# Patient Record
Sex: Female | Born: 1965 | Race: White | Hispanic: No | Marital: Married | Smoking: Never smoker
Health system: Southern US, Community
[De-identification: ages and names within clinical notes are randomized; demographics above are authoritative.]

## PROBLEM LIST (undated history)

## (undated) DIAGNOSIS — F329 Major depressive disorder, single episode, unspecified: Secondary | ICD-10-CM

## (undated) DIAGNOSIS — F988 Other specified behavioral and emotional disorders with onset usually occurring in childhood and adolescence: Secondary | ICD-10-CM

## (undated) DIAGNOSIS — F419 Anxiety disorder, unspecified: Secondary | ICD-10-CM

## (undated) DIAGNOSIS — N390 Urinary tract infection, site not specified: Secondary | ICD-10-CM

## (undated) DIAGNOSIS — Z8659 Personal history of other mental and behavioral disorders: Secondary | ICD-10-CM

## (undated) DIAGNOSIS — J309 Allergic rhinitis, unspecified: Secondary | ICD-10-CM

## (undated) DIAGNOSIS — K219 Gastro-esophageal reflux disease without esophagitis: Secondary | ICD-10-CM

## (undated) DIAGNOSIS — J019 Acute sinusitis, unspecified: Secondary | ICD-10-CM

## (undated) DIAGNOSIS — C50919 Malignant neoplasm of unspecified site of unspecified female breast: Secondary | ICD-10-CM

## (undated) DIAGNOSIS — R220 Localized swelling, mass and lump, head: Secondary | ICD-10-CM

## (undated) DIAGNOSIS — D649 Anemia, unspecified: Secondary | ICD-10-CM

## (undated) DIAGNOSIS — E119 Type 2 diabetes mellitus without complications: Secondary | ICD-10-CM

## (undated) DIAGNOSIS — R221 Localized swelling, mass and lump, neck: Secondary | ICD-10-CM

## (undated) DIAGNOSIS — R5381 Other malaise: Secondary | ICD-10-CM

## (undated) DIAGNOSIS — Z923 Personal history of irradiation: Secondary | ICD-10-CM

## (undated) DIAGNOSIS — Z9221 Personal history of antineoplastic chemotherapy: Secondary | ICD-10-CM

## (undated) DIAGNOSIS — R1084 Generalized abdominal pain: Secondary | ICD-10-CM

## (undated) DIAGNOSIS — J342 Deviated nasal septum: Secondary | ICD-10-CM

## (undated) DIAGNOSIS — R5383 Other fatigue: Secondary | ICD-10-CM

## (undated) DIAGNOSIS — E785 Hyperlipidemia, unspecified: Secondary | ICD-10-CM

## (undated) HISTORY — DX: Other malaise: R53.81

## (undated) HISTORY — DX: Urinary tract infection, site not specified: N39.0

## (undated) HISTORY — DX: Localized swelling, mass and lump, head: R22.0

## (undated) HISTORY — DX: Hyperlipidemia, unspecified: E78.5

## (undated) HISTORY — PX: MASTECTOMY: SHX3

## (undated) HISTORY — DX: Deviated nasal septum: J34.2

## (undated) HISTORY — DX: Other fatigue: R53.83

## (undated) HISTORY — PX: NASAL SEPTUM SURGERY: SHX37

## (undated) HISTORY — DX: Acute sinusitis, unspecified: J01.90

## (undated) HISTORY — DX: Anxiety disorder, unspecified: F41.9

## (undated) HISTORY — DX: Gastro-esophageal reflux disease without esophagitis: K21.9

## (undated) HISTORY — DX: Malignant neoplasm of unspecified site of unspecified female breast: C50.919

## (undated) HISTORY — DX: Personal history of other mental and behavioral disorders: Z86.59

## (undated) HISTORY — DX: Localized swelling, mass and lump, neck: R22.1

## (undated) HISTORY — DX: Anemia, unspecified: D64.9

## (undated) HISTORY — DX: Other specified behavioral and emotional disorders with onset usually occurring in childhood and adolescence: F98.8

## (undated) HISTORY — DX: Major depressive disorder, single episode, unspecified: F32.9

## (undated) HISTORY — PX: COSMETIC SURGERY: SHX468

## (undated) HISTORY — DX: Generalized abdominal pain: R10.84

## (undated) HISTORY — PX: BROW LIFT: SHX178

## (undated) HISTORY — DX: Allergic rhinitis, unspecified: J30.9

---

## 1999-12-09 ENCOUNTER — Other Ambulatory Visit: Admission: RE | Admit: 1999-12-09 | Discharge: 1999-12-09 | Payer: Self-pay | Admitting: Obstetrics and Gynecology

## 2000-11-11 ENCOUNTER — Observation Stay (HOSPITAL_COMMUNITY): Admission: AD | Admit: 2000-11-11 | Discharge: 2000-11-11 | Payer: Self-pay | Admitting: *Deleted

## 2000-11-22 ENCOUNTER — Inpatient Hospital Stay (HOSPITAL_COMMUNITY): Admission: AD | Admit: 2000-11-22 | Discharge: 2000-11-22 | Payer: Self-pay | Admitting: Obstetrics and Gynecology

## 2000-11-22 ENCOUNTER — Encounter: Payer: Self-pay | Admitting: Obstetrics and Gynecology

## 2000-11-26 ENCOUNTER — Other Ambulatory Visit: Admission: RE | Admit: 2000-11-26 | Discharge: 2000-11-26 | Payer: Self-pay | Admitting: Obstetrics and Gynecology

## 2000-12-07 ENCOUNTER — Inpatient Hospital Stay (HOSPITAL_COMMUNITY): Admission: AD | Admit: 2000-12-07 | Discharge: 2000-12-11 | Payer: Self-pay | Admitting: *Deleted

## 2001-07-13 ENCOUNTER — Inpatient Hospital Stay (HOSPITAL_COMMUNITY): Admission: AD | Admit: 2001-07-13 | Discharge: 2001-07-15 | Payer: Self-pay | Admitting: Obstetrics and Gynecology

## 2001-08-18 ENCOUNTER — Other Ambulatory Visit: Admission: RE | Admit: 2001-08-18 | Discharge: 2001-08-18 | Payer: Self-pay | Admitting: Obstetrics and Gynecology

## 2004-04-29 ENCOUNTER — Ambulatory Visit: Payer: Self-pay | Admitting: Internal Medicine

## 2004-07-03 ENCOUNTER — Ambulatory Visit (HOSPITAL_COMMUNITY): Admission: RE | Admit: 2004-07-03 | Discharge: 2004-07-03 | Payer: Self-pay | Admitting: Obstetrics and Gynecology

## 2004-11-12 ENCOUNTER — Ambulatory Visit: Payer: Self-pay | Admitting: Internal Medicine

## 2005-02-11 ENCOUNTER — Ambulatory Visit: Payer: Self-pay | Admitting: Internal Medicine

## 2005-05-13 ENCOUNTER — Ambulatory Visit (HOSPITAL_COMMUNITY): Admission: RE | Admit: 2005-05-13 | Discharge: 2005-05-13 | Payer: Self-pay | Admitting: Internal Medicine

## 2005-05-13 ENCOUNTER — Ambulatory Visit: Payer: Self-pay | Admitting: Internal Medicine

## 2006-05-27 ENCOUNTER — Ambulatory Visit: Payer: Self-pay | Admitting: Internal Medicine

## 2006-05-27 LAB — CONVERTED CEMR LAB
ALT: 29 units/L (ref 0–40)
AST: 34 units/L (ref 0–37)
Albumin: 4.2 g/dL (ref 3.5–5.2)
Alkaline Phosphatase: 59 units/L (ref 39–117)
BUN: 11 mg/dL (ref 6–23)
Basophils Absolute: 0 10*3/uL (ref 0.0–0.1)
Basophils Relative: 0.6 % (ref 0.0–1.0)
Bilirubin Urine: NEGATIVE
Bilirubin, Direct: 0.1 mg/dL (ref 0.0–0.3)
CO2: 28 meq/L (ref 19–32)
Calcium: 9.7 mg/dL (ref 8.4–10.5)
Chloride: 104 meq/L (ref 96–112)
Cholesterol: 230 mg/dL (ref 0–200)
Creatinine, Ser: 0.8 mg/dL (ref 0.4–1.2)
Direct LDL: 140.2 mg/dL
Eosinophils Absolute: 0.1 10*3/uL (ref 0.0–0.6)
Eosinophils Relative: 1.9 % (ref 0.0–5.0)
GFR calc Af Amer: 101 mL/min
GFR calc non Af Amer: 84 mL/min
Glucose, Bld: 99 mg/dL (ref 70–99)
HCT: 41.2 % (ref 36.0–46.0)
HDL: 76 mg/dL (ref >39.0)
Hemoglobin: 14.3 g/dL (ref 12.0–15.0)
Ketones, ur: NEGATIVE mg/dL
Lymphocytes Relative: 33.5 % (ref 12.0–46.0)
MCHC: 34.6 g/dL (ref 30.0–36.0)
MCV: 91.8 fL (ref 78.0–100.0)
Monocytes Absolute: 0.4 10*3/uL (ref 0.2–0.7)
Monocytes Relative: 9 % (ref 3.0–11.0)
Mucus, UA: NEGATIVE
Neutro Abs: 2.6 10*3/uL (ref 1.4–7.7)
Neutrophils Relative %: 55 % (ref 43.0–77.0)
Nitrite: NEGATIVE
Platelets: 302 10*3/uL (ref 150–400)
Potassium: 4.2 meq/L (ref 3.5–5.1)
RBC: 4.49 M/uL (ref 3.87–5.11)
RDW: 11.4 % — ABNORMAL LOW (ref 11.5–14.6)
Sodium: 139 meq/L (ref 135–145)
Specific Gravity, Urine: 1.025 (ref 1.000–1.03)
TSH: 2.04 microintl units/mL (ref 0.35–5.50)
Total Bilirubin: 0.5 mg/dL (ref 0.3–1.2)
Total CHOL/HDL Ratio: 3
Total Protein, Urine: NEGATIVE mg/dL
Total Protein: 7.4 g/dL (ref 6.0–8.3)
Triglycerides: 48 mg/dL (ref 0–149)
Urine Glucose: NEGATIVE mg/dL
Urobilinogen, UA: 0.2 (ref 0.0–1.0)
VLDL: 10 mg/dL (ref 0–40)
WBC: 4.7 10*3/uL (ref 4.5–10.5)
pH: 7 (ref 5.0–8.0)

## 2007-02-05 DIAGNOSIS — J342 Deviated nasal septum: Secondary | ICD-10-CM

## 2007-02-05 DIAGNOSIS — D649 Anemia, unspecified: Secondary | ICD-10-CM | POA: Insufficient documentation

## 2007-02-05 DIAGNOSIS — Z8659 Personal history of other mental and behavioral disorders: Secondary | ICD-10-CM

## 2007-02-05 HISTORY — DX: Deviated nasal septum: J34.2

## 2007-02-05 HISTORY — DX: Personal history of other mental and behavioral disorders: Z86.59

## 2007-02-05 HISTORY — DX: Anemia, unspecified: D64.9

## 2007-02-23 ENCOUNTER — Ambulatory Visit: Payer: Self-pay | Admitting: Internal Medicine

## 2007-02-23 DIAGNOSIS — R5381 Other malaise: Secondary | ICD-10-CM

## 2007-02-23 DIAGNOSIS — J309 Allergic rhinitis, unspecified: Secondary | ICD-10-CM | POA: Insufficient documentation

## 2007-02-23 DIAGNOSIS — N390 Urinary tract infection, site not specified: Secondary | ICD-10-CM

## 2007-02-23 DIAGNOSIS — F329 Major depressive disorder, single episode, unspecified: Secondary | ICD-10-CM

## 2007-02-23 DIAGNOSIS — E785 Hyperlipidemia, unspecified: Secondary | ICD-10-CM

## 2007-02-23 DIAGNOSIS — R5383 Other fatigue: Secondary | ICD-10-CM | POA: Insufficient documentation

## 2007-02-23 HISTORY — DX: Other malaise: R53.81

## 2007-02-23 HISTORY — DX: Urinary tract infection, site not specified: N39.0

## 2007-02-23 HISTORY — DX: Hyperlipidemia, unspecified: E78.5

## 2007-02-23 HISTORY — DX: Allergic rhinitis, unspecified: J30.9

## 2007-02-23 HISTORY — DX: Major depressive disorder, single episode, unspecified: F32.9

## 2007-02-26 ENCOUNTER — Ambulatory Visit: Payer: Self-pay | Admitting: Family Medicine

## 2007-02-26 DIAGNOSIS — J019 Acute sinusitis, unspecified: Secondary | ICD-10-CM

## 2007-02-26 HISTORY — DX: Acute sinusitis, unspecified: J01.90

## 2007-04-04 ENCOUNTER — Ambulatory Visit: Payer: Self-pay | Admitting: Internal Medicine

## 2007-04-14 LAB — CONVERTED CEMR LAB: Pap Smear: NORMAL

## 2007-11-29 ENCOUNTER — Telehealth (INDEPENDENT_AMBULATORY_CARE_PROVIDER_SITE_OTHER): Payer: Self-pay | Admitting: *Deleted

## 2008-01-25 ENCOUNTER — Ambulatory Visit: Payer: Self-pay | Admitting: Internal Medicine

## 2008-02-02 ENCOUNTER — Ambulatory Visit: Payer: Self-pay | Admitting: Professional

## 2008-02-09 ENCOUNTER — Ambulatory Visit: Payer: Self-pay | Admitting: Professional

## 2008-02-13 ENCOUNTER — Ambulatory Visit: Payer: Self-pay | Admitting: Professional

## 2008-02-20 ENCOUNTER — Ambulatory Visit: Payer: Self-pay | Admitting: Professional

## 2008-02-27 ENCOUNTER — Ambulatory Visit: Payer: Self-pay | Admitting: Professional

## 2008-03-05 ENCOUNTER — Ambulatory Visit: Payer: Self-pay | Admitting: Professional

## 2008-03-12 ENCOUNTER — Ambulatory Visit: Payer: Self-pay | Admitting: Professional

## 2008-03-19 ENCOUNTER — Ambulatory Visit: Payer: Self-pay | Admitting: Professional

## 2008-03-23 ENCOUNTER — Ambulatory Visit: Payer: Self-pay | Admitting: Internal Medicine

## 2008-03-23 LAB — CONVERTED CEMR LAB
ALT: 16 units/L (ref 0–35)
AST: 18 units/L (ref 0–37)
Albumin: 3.8 g/dL (ref 3.5–5.2)
Alkaline Phosphatase: 53 units/L (ref 39–117)
BUN: 8 mg/dL (ref 6–23)
Basophils Absolute: 0 10*3/uL (ref 0.0–0.1)
Basophils Relative: 0.1 % (ref 0.0–3.0)
Bilirubin Urine: NEGATIVE
Bilirubin, Direct: 0.1 mg/dL (ref 0.0–0.3)
CO2: 27 meq/L (ref 19–32)
Calcium: 9.2 mg/dL (ref 8.4–10.5)
Chloride: 105 meq/L (ref 96–112)
Cholesterol: 224 mg/dL (ref 0–200)
Creatinine, Ser: 0.8 mg/dL (ref 0.4–1.2)
Direct LDL: 121.8 mg/dL
Eosinophils Absolute: 0.2 10*3/uL (ref 0.0–0.7)
Eosinophils Relative: 3.7 % (ref 0.0–5.0)
GFR calc Af Amer: 100 mL/min
GFR calc non Af Amer: 83 mL/min
Glucose, Bld: 85 mg/dL (ref 70–99)
HCT: 36.4 % (ref 36.0–46.0)
HDL: 81.6 mg/dL (ref >39.0)
Hemoglobin, Urine: NEGATIVE
Hemoglobin: 12.6 g/dL (ref 12.0–15.0)
Ketones, ur: NEGATIVE mg/dL
Leukocytes, UA: NEGATIVE
Lymphocytes Relative: 30.4 % (ref 12.0–46.0)
MCHC: 34.7 g/dL (ref 30.0–36.0)
MCV: 92.8 fL (ref 78.0–100.0)
Monocytes Absolute: 0.6 10*3/uL (ref 0.1–1.0)
Monocytes Relative: 9.8 % (ref 3.0–12.0)
Neutro Abs: 3.2 10*3/uL (ref 1.4–7.7)
Neutrophils Relative %: 56 % (ref 43.0–77.0)
Nitrite: NEGATIVE
Platelets: 177 10*3/uL (ref 150–400)
Potassium: 4.4 meq/L (ref 3.5–5.1)
RBC: 3.92 M/uL (ref 3.87–5.11)
RDW: 11.5 % (ref 11.5–14.6)
Sodium: 139 meq/L (ref 135–145)
Specific Gravity, Urine: 1.015 (ref 1.000–1.03)
TSH: 1.41 microintl units/mL (ref 0.35–5.50)
Total Bilirubin: 0.6 mg/dL (ref 0.3–1.2)
Total CHOL/HDL Ratio: 2.7
Total Protein, Urine: NEGATIVE mg/dL
Total Protein: 6.4 g/dL (ref 6.0–8.3)
Triglycerides: 48 mg/dL (ref 0–149)
Urine Glucose: NEGATIVE mg/dL
Urobilinogen, UA: 0.2 (ref 0.0–1.0)
VLDL: 10 mg/dL (ref 0–40)
WBC: 5.7 10*3/uL (ref 4.5–10.5)
pH: 7.5 (ref 5.0–8.0)

## 2008-03-26 ENCOUNTER — Ambulatory Visit: Payer: Self-pay | Admitting: Professional

## 2008-03-26 ENCOUNTER — Ambulatory Visit: Payer: Self-pay | Admitting: Internal Medicine

## 2008-04-16 ENCOUNTER — Ambulatory Visit: Payer: Self-pay | Admitting: Professional

## 2008-04-23 ENCOUNTER — Ambulatory Visit: Payer: Self-pay | Admitting: Professional

## 2008-04-24 ENCOUNTER — Ambulatory Visit: Payer: Self-pay | Admitting: Internal Medicine

## 2008-04-26 ENCOUNTER — Telehealth (INDEPENDENT_AMBULATORY_CARE_PROVIDER_SITE_OTHER): Payer: Self-pay | Admitting: *Deleted

## 2008-04-30 ENCOUNTER — Ambulatory Visit: Payer: Self-pay | Admitting: Professional

## 2008-05-07 ENCOUNTER — Ambulatory Visit: Payer: Self-pay | Admitting: Professional

## 2008-05-17 ENCOUNTER — Ambulatory Visit: Payer: Self-pay | Admitting: Professional

## 2008-05-21 ENCOUNTER — Ambulatory Visit: Payer: Self-pay | Admitting: Professional

## 2008-05-28 ENCOUNTER — Ambulatory Visit: Payer: Self-pay | Admitting: Professional

## 2008-06-04 ENCOUNTER — Ambulatory Visit: Payer: Self-pay | Admitting: Professional

## 2008-06-11 ENCOUNTER — Ambulatory Visit: Payer: Self-pay | Admitting: Professional

## 2008-06-21 ENCOUNTER — Ambulatory Visit: Payer: Self-pay | Admitting: Professional

## 2008-07-09 ENCOUNTER — Ambulatory Visit: Payer: Self-pay | Admitting: Professional

## 2008-07-10 ENCOUNTER — Telehealth (INDEPENDENT_AMBULATORY_CARE_PROVIDER_SITE_OTHER): Payer: Self-pay | Admitting: *Deleted

## 2008-07-23 ENCOUNTER — Ambulatory Visit: Payer: Self-pay | Admitting: Professional

## 2008-07-27 ENCOUNTER — Ambulatory Visit: Payer: Self-pay | Admitting: Internal Medicine

## 2008-07-27 DIAGNOSIS — R220 Localized swelling, mass and lump, head: Secondary | ICD-10-CM

## 2008-07-27 DIAGNOSIS — R221 Localized swelling, mass and lump, neck: Secondary | ICD-10-CM

## 2008-07-27 HISTORY — DX: Localized swelling, mass and lump, head: R22.0

## 2008-07-30 ENCOUNTER — Ambulatory Visit: Payer: Self-pay | Admitting: Professional

## 2008-08-06 ENCOUNTER — Ambulatory Visit: Payer: Self-pay | Admitting: Professional

## 2008-08-13 ENCOUNTER — Ambulatory Visit: Payer: Self-pay | Admitting: Professional

## 2008-08-27 ENCOUNTER — Ambulatory Visit: Payer: Self-pay | Admitting: Professional

## 2008-09-17 ENCOUNTER — Ambulatory Visit: Payer: Self-pay | Admitting: Professional

## 2008-10-01 ENCOUNTER — Ambulatory Visit: Payer: Self-pay | Admitting: Professional

## 2008-11-01 ENCOUNTER — Ambulatory Visit: Payer: Self-pay | Admitting: Professional

## 2008-11-12 ENCOUNTER — Ambulatory Visit: Payer: Self-pay | Admitting: Professional

## 2008-11-19 ENCOUNTER — Ambulatory Visit: Payer: Self-pay | Admitting: Professional

## 2008-11-29 ENCOUNTER — Ambulatory Visit: Payer: Self-pay | Admitting: Professional

## 2008-12-06 ENCOUNTER — Ambulatory Visit: Payer: Self-pay | Admitting: Professional

## 2008-12-27 ENCOUNTER — Ambulatory Visit: Payer: Self-pay | Admitting: Internal Medicine

## 2008-12-27 DIAGNOSIS — K219 Gastro-esophageal reflux disease without esophagitis: Secondary | ICD-10-CM

## 2008-12-27 DIAGNOSIS — R1084 Generalized abdominal pain: Secondary | ICD-10-CM | POA: Insufficient documentation

## 2008-12-27 HISTORY — DX: Gastro-esophageal reflux disease without esophagitis: K21.9

## 2008-12-27 HISTORY — DX: Generalized abdominal pain: R10.84

## 2009-01-02 ENCOUNTER — Encounter: Payer: Self-pay | Admitting: Internal Medicine

## 2009-01-03 ENCOUNTER — Ambulatory Visit: Payer: Self-pay | Admitting: Professional

## 2009-01-10 ENCOUNTER — Ambulatory Visit: Payer: Self-pay | Admitting: Professional

## 2009-01-17 ENCOUNTER — Ambulatory Visit: Payer: Self-pay | Admitting: Professional

## 2009-01-21 ENCOUNTER — Ambulatory Visit: Payer: Self-pay | Admitting: Professional

## 2009-01-28 ENCOUNTER — Ambulatory Visit: Payer: Self-pay | Admitting: Professional

## 2009-01-29 ENCOUNTER — Ambulatory Visit: Payer: Self-pay | Admitting: Gastroenterology

## 2009-01-30 LAB — CONVERTED CEMR LAB
BUN: 11 mg/dL (ref 6–23)
Basophils Absolute: 0 10*3/uL (ref 0.0–0.1)
Basophils Relative: 0.8 % (ref 0.0–3.0)
CO2: 26 meq/L (ref 19–32)
Calcium: 9.3 mg/dL (ref 8.4–10.5)
Chloride: 107 meq/L (ref 96–112)
Creatinine, Ser: 0.8 mg/dL (ref 0.4–1.2)
Eosinophils Absolute: 0.1 10*3/uL (ref 0.0–0.7)
Eosinophils Relative: 1.7 % (ref 0.0–5.0)
GFR calc non Af Amer: 82.25 mL/min (ref >60)
Glucose, Bld: 87 mg/dL (ref 70–99)
HCT: 38.6 % (ref 36.0–46.0)
Hemoglobin: 13.3 g/dL (ref 12.0–15.0)
Lymphocytes Relative: 31.9 % (ref 12.0–46.0)
Lymphs Abs: 1.7 10*3/uL (ref 0.7–4.0)
MCHC: 34.4 g/dL (ref 30.0–36.0)
MCV: 93.8 fL (ref 78.0–100.0)
Monocytes Absolute: 0.4 10*3/uL (ref 0.1–1.0)
Monocytes Relative: 6.7 % (ref 3.0–12.0)
Neutro Abs: 3.1 10*3/uL (ref 1.4–7.7)
Neutrophils Relative %: 58.9 % (ref 43.0–77.0)
Platelets: 215 10*3/uL (ref 150.0–400.0)
Potassium: 4.2 meq/L (ref 3.5–5.1)
RBC: 4.11 M/uL (ref 3.87–5.11)
RDW: 11.4 % — ABNORMAL LOW (ref 11.5–14.6)
Sodium: 141 meq/L (ref 135–145)
TSH: 1.59 microintl units/mL (ref 0.35–5.50)
WBC: 5.3 10*3/uL (ref 4.5–10.5)

## 2009-02-11 ENCOUNTER — Ambulatory Visit: Payer: Self-pay | Admitting: Professional

## 2009-02-25 ENCOUNTER — Ambulatory Visit: Payer: Self-pay | Admitting: Professional

## 2009-03-21 ENCOUNTER — Ambulatory Visit: Payer: Self-pay | Admitting: Professional

## 2009-04-11 ENCOUNTER — Ambulatory Visit: Payer: Self-pay | Admitting: Professional

## 2009-05-02 ENCOUNTER — Ambulatory Visit: Payer: Self-pay | Admitting: Professional

## 2009-05-10 ENCOUNTER — Telehealth: Payer: Self-pay | Admitting: Internal Medicine

## 2009-07-23 ENCOUNTER — Telehealth: Payer: Self-pay | Admitting: Internal Medicine

## 2009-08-19 ENCOUNTER — Ambulatory Visit: Payer: Self-pay | Admitting: Professional

## 2010-03-11 ENCOUNTER — Ambulatory Visit: Payer: Self-pay | Admitting: Internal Medicine

## 2010-03-11 LAB — CONVERTED CEMR LAB
ALT: 18 units/L (ref 0–35)
AST: 18 units/L (ref 0–37)
Albumin: 4.2 g/dL (ref 3.5–5.2)
Alkaline Phosphatase: 56 units/L (ref 39–117)
BUN: 13 mg/dL (ref 6–23)
Basophils Absolute: 0 10*3/uL (ref 0.0–0.1)
Basophils Relative: 0.5 % (ref 0.0–3.0)
Bilirubin Urine: NEGATIVE
Bilirubin, Direct: 0.1 mg/dL (ref 0.0–0.3)
CO2: 30 meq/L (ref 19–32)
Calcium: 9.7 mg/dL (ref 8.4–10.5)
Chloride: 104 meq/L (ref 96–112)
Cholesterol: 273 mg/dL — ABNORMAL HIGH (ref 0–200)
Creatinine, Ser: 0.8 mg/dL (ref 0.4–1.2)
Direct LDL: 158.1 mg/dL
Eosinophils Absolute: 0.1 10*3/uL (ref 0.0–0.7)
Eosinophils Relative: 1.2 % (ref 0.0–5.0)
GFR calc non Af Amer: 77.37 mL/min (ref >60)
Glucose, Bld: 91 mg/dL (ref 70–99)
HCT: 40 % (ref 36.0–46.0)
HDL: 83 mg/dL (ref >39.00)
Hemoglobin, Urine: NEGATIVE
Hemoglobin: 13.8 g/dL (ref 12.0–15.0)
Ketones, ur: NEGATIVE mg/dL
Leukocytes, UA: NEGATIVE
Lymphocytes Relative: 26.6 % (ref 12.0–46.0)
Lymphs Abs: 1.6 10*3/uL (ref 0.7–4.0)
MCHC: 34.5 g/dL (ref 30.0–36.0)
MCV: 93.2 fL (ref 78.0–100.0)
Monocytes Absolute: 0.4 10*3/uL (ref 0.1–1.0)
Monocytes Relative: 7.5 % (ref 3.0–12.0)
Neutro Abs: 3.8 10*3/uL (ref 1.4–7.7)
Neutrophils Relative %: 64.2 % (ref 43.0–77.0)
Nitrite: NEGATIVE
Platelets: 258 10*3/uL (ref 150.0–400.0)
Potassium: 5 meq/L (ref 3.5–5.1)
RBC: 4.29 M/uL (ref 3.87–5.11)
RDW: 12.8 % (ref 11.5–14.6)
Sodium: 140 meq/L (ref 135–145)
Specific Gravity, Urine: 1.015 (ref 1.000–1.030)
TSH: 1.29 microintl units/mL (ref 0.35–5.50)
Total Bilirubin: 0.3 mg/dL (ref 0.3–1.2)
Total CHOL/HDL Ratio: 3
Total Protein, Urine: NEGATIVE mg/dL
Total Protein: 6.8 g/dL (ref 6.0–8.3)
Triglycerides: 79 mg/dL (ref 0.0–149.0)
Urine Glucose: NEGATIVE mg/dL
Urobilinogen, UA: 0.2 (ref 0.0–1.0)
VLDL: 15.8 mg/dL (ref 0.0–40.0)
WBC: 6 10*3/uL (ref 4.5–10.5)
pH: 7.5 (ref 5.0–8.0)

## 2010-03-13 ENCOUNTER — Encounter: Payer: Self-pay | Admitting: Internal Medicine

## 2010-03-13 ENCOUNTER — Ambulatory Visit: Payer: Self-pay | Admitting: Internal Medicine

## 2010-03-13 DIAGNOSIS — F988 Other specified behavioral and emotional disorders with onset usually occurring in childhood and adolescence: Secondary | ICD-10-CM | POA: Insufficient documentation

## 2010-03-13 HISTORY — DX: Other specified behavioral and emotional disorders with onset usually occurring in childhood and adolescence: F98.8

## 2010-05-13 NOTE — Progress Notes (Signed)
Summary: REFILL  Phone Note Refill Request Call back at Work Phone [phone]   Refills Requested: Medication #1:  ADDERALL 20 MG TABS 1 by mouth two times a day  - to fill jan 28 Initial call taken by: Lamar Sprinkles, CMA,  July 23, 2009 1:22 PM    New/Updated Medications: ADDERALL 20 MG TABS (AMPHETAMINE-DEXTROAMPHETAMINE) 1 by mouth two times a day  - to fill Jul 23, 2009 Prescriptions: ADDERALL 20 MG TABS (AMPHETAMINE-DEXTROAMPHETAMINE) 1 by mouth two times a day  - to fill Jul 23, 2009  #60 x 0   Entered and Authorized by:   Corwin Levins MD   Signed by:   Corwin Levins MD on 07/23/2009   Method used:   Print then Give to Patient   RxID:   [id number]  done hardcopy to LIM side B - dahlia  Corwin Levins MD  July 23, 2009 1:25 PM   pt informed, Rx in cabinet for pick up Margaret Pyle, CMA  July 23, 2009 3:03 PM

## 2010-05-13 NOTE — Progress Notes (Signed)
Summary: Adderall  Phone Note Call from Patient Call back at Work Phone [phone]   Caller: Patient Summary of Call: pt called requesting refill of Adderall Initial call taken by: Margaret Pyle, CMA,  May 10, 2009 1:36 PM  Follow-up for Phone Call        pt informed, rx in cabinet for pt pick up Follow-up by: Margaret Pyle, CMA,  May 10, 2009 1:57 PM    New/Updated Medications: ADDERALL 20 MG TABS (AMPHETAMINE-DEXTROAMPHETAMINE) 1 by mouth two times a day  - to fill May 10, 2009 Prescriptions: ADDERALL 20 MG TABS (AMPHETAMINE-DEXTROAMPHETAMINE) 1 by mouth two times a day  - to fill May 10, 2009  #60 x 0   Entered and Authorized by:   Corwin Levins MD   Signed by:   Corwin Levins MD on 05/10/2009   Method used:   Print then Give to Patient   RxID:   [id number]  done hardcopy to LIM side B - dahlia Corwin Levins MD  May 10, 2009 1:41 PM

## 2010-05-13 NOTE — Assessment & Plan Note (Signed)
Summary: cpx-lb   Vital Signs:  Patient profile:   45 year old female Height:      66 inches Weight:      164 pounds BMI:     26.57 O2 Sat:      97 % on Room air Temp:     98.4 degrees F oral Pulse rate:   90 / minute BP sitting:   90 / 62  (left arm) Cuff size:   regular  Vitals Entered By: Zella Ball Ewing CMA Duncan Dull) (March 13, 2010 1:17 PM)  O2 Flow:  Room air  Preventive Care Screening  Mammogram:    Date:  09/11/2009    Results:  normal   Pap Smear:    Date:  09/11/2009    Results:  normal   CC: Adult Physical/RE   Primary Care Provider:  Oliver Barre, MD  CC:  Adult Physical/RE.  History of Present Illness: here for wellness and f/u;  overall doing well.  Pt denies CP, worsening sob, doe, wheezing, orthopnea, pnd, worsening LE edema, palps, dizziness or syncope  Pt denies new neuro symptoms such as headache, facial or extremity weakness  Pt denies polydipsia, polyuria  Overall good compliance with meds, wt up 12 lbs with little excercise however  and admits to not be following low chol diet. Used to to [facility] regularly when wt less 2 yrs ago.   Works 80 hr wks.  Pt denies new neuro symptoms such as headache, facial or extremity weakness Denies worsening depressive symptoms, suicidal ideation, or panic., alhtough has been out of her adderal for some time and wants to re-start as she has work performance issues with task completion, wants to consider vyvanse.  No fever, wt loss, night sweats, loss of appetite or other constitutional symptoms  Overall good compliance with meds, and good tolerability.  Pt states good ability with ADL's, low fall risk, home safety reviewed and adequate, no significant change in hearing or vision, and occasionally active only with regular excercise.   Preventive Screening-Counseling & Management      Drug Use:  no.    Problems Prior to Update: 1)  Abdominal Pain, Generalized  (ICD-789.07) 2)  Gerd  (ICD-530.81) 3)  Sinusitis- Acute-nos   (ICD-461.9) 4)  Swelling Mass or Lump in Head and Neck  (ICD-784.2) 5)  Sinusitis- Acute-nos  (ICD-461.9) 6)  Preventive Health Care  (ICD-V70.0) 7)  Acute Sinusitis, Unspecified  (ICD-461.9) 8)  Fatigue  (ICD-780.79) 9)  Allergic Rhinitis  (ICD-477.9) 10)  Hyperlipidemia  (ICD-272.4) 11)  Depression  (ICD-311) 12)  Uti  (ICD-599.0) 13)  Family History Depression  (ICD-V17.0) 14)  Family History of Alcoholism/addiction  (ICD-V61.41) 15)  Hx of Deviated Nasal Septum  (ICD-470) 16)  Caesarean Section, Hx of  (ICD-V39.01) 17)  Attention Deficit Disorder, Hx of  (ICD-V11.8) 18)  Hx of Anemia  (ICD-285.9)  Medications Prior to Update: 1)  Yaz 3-0.02 Mg Tabs (Drospirenone-Ethinyl Estradiol) .Marland Kitchen.. 1 By Mouth Once Daily 2)  Adderall 20 Mg Tabs (Amphetamine-Dextroamphetamine) .Marland Kitchen.. 1 By Mouth Two Times A Day  - To Fill Jul 23, 2009  Current Medications (verified): 1)  Yaz 3-0.02 Mg Tabs (Drospirenone-Ethinyl Estradiol) .Marland Kitchen.. 1 By Mouth Once Daily 2)  Vyvanse 40 Mg Caps (Lisdexamfetamine Dimesylate) .Marland Kitchen.. 1 By Mouth Once Daily  - To Fill Mar 13, 2010 3)  Aspir-Low 81 Mg Tbec (Aspirin) .Marland Kitchen.. 1 By Mouth Once Daily  Allergies (verified): 1)  ! Phenergan  Past History:  Past Medical History: Last updated: 01/29/2009  ATTENTION DEFICIT DISORDER, HX OF (ICD-V11.8) Hx of ANEMIA (ICD-285.9) Depression Hyperlipidemia Allergic rhinitis GERD chronic sinusitis  Past Surgical History: Last updated: 01/29/2009  DEVIATED NASAL SEPTUM (ICD-470) CAESAREAN SECTION, HX OF (ICD-V39.01)     Family History: Last updated: 2010/04/06 half - sister deceased with heart problems grandfather died with MI mother with HTN father with COPD Family History of Alcoholism/Addiction Family History Depression glaucoma no colon cancer sister and father with atrial fibrillation (sister 83yrs older)  Social History: Last updated: 04-06-10 Married Never Smoked Alcohol use-yes - rare master's in fine arts -  2000 - [employer] 2 children   Drug use-no  Risk Factors: Smoking Status: never (02/23/2007)  Family History: Reviewed history from 01/29/2009 and no changes required. half - sister deceased with heart problems grandfather died with MI mother with HTN father with COPD Family History of Alcoholism/Addiction Family History Depression glaucoma no colon cancer sister and father with atrial fibrillation (sister 30yrs older)  Social History: Reviewed history from 01/29/2009 and no changes required. Married Never Smoked Alcohol use-yes - rare master's in fine arts - 2000 - [employer] 2 children   Drug use-no Drug Use:  no  Review of Systems  The patient denies anorexia, fever, vision loss, decreased hearing, hoarseness, chest pain, syncope, dyspnea on exertion, peripheral edema, prolonged cough, headaches, hemoptysis, abdominal pain, melena, hematochezia, severe indigestion/heartburn, hematuria, muscle weakness, suspicious skin lesions, transient blindness, difficulty walking, depression, unusual weight change, abnormal bleeding, enlarged lymph nodes, and angioedema.         all otherwise negative per pt -  has occasional palpitations without assoc symptoms  Physical Exam  General:  alert and well-developed.  Head:  normocephalic and atraumatic.   Eyes:  vision grossly intact, pupils equal, and pupils round.   Ears:  R ear normal and L ear normal.   Nose:  no external deformity and no nasal discharge.   Mouth:  no gingival abnormalities and pharynx pink and moist.   Neck:  supple and no masses.   Lungs:  normal respiratory effort and normal breath sounds.   Heart:  normal rate and regular rhythm.   Abdomen:  soft, non-tender, and normal bowel sounds.   Msk:  no joint tenderness and no joint swelling.   Extremities:  no edema, no erythema  Neurologic:  strength normal in all extremities, sensation intact to light touch, and gait normal.   Skin:  color normal and no rashes.   Psych:   not depressed appearing and slightly anxious.     Impression & Recommendations:  Problem # 1:  Preventive Health Care (ICD-V70.0) Overall doing well, age appropriate education and counseling updated, referral for preventive services and immunizations addressed, dietary counseling and smoking status adressed , most recent labs reviewed, ecg reviewed I have personally reviewed and have noted 1.The patient's medical and social history 2.Their use of alcohol, tobacco or illicit drugs 3.Their current medications and supplements 4. Functional ability including ADL's, fall risk, home safety risk, hearing & visual impairment  5.Diet and physical activities 6.Evidence for depression or mood disorders The patients weight, height, BMI  have been recorded in the chart I have made referrals, counseling and provided education to the patient based review of the above  Orders: EKG w/ Interpretation (93000)  Problem # 2:  ADD (ICD-314.00) tx with vyvanse - asd   Complete Medication List: 1)  Yaz 3-0.02 Mg Tabs (Drospirenone-ethinyl estradiol) .Marland Kitchen.. 1 by mouth once daily 2)  Vyvanse 40 Mg Caps (Lisdexamfetamine dimesylate) .Marland Kitchen.. 1 by  mouth once daily  - to fill Mar 13, 2010 3)  Aspir-low 81 Mg Tbec (Aspirin) .Marland Kitchen.. 1 by mouth once daily  Other Orders: Tdap => 66yrs IM (59563) Admin 1st Vaccine (87564)  Patient Instructions: 1)  Please change te aspirin to 81 mg - 1 per day - COATED only 2)  stop the adderal 3)  take the vyvanse 40 mg 1 per day 4)  see [email or web address].[email or web address] for 50% off coupon 5)  you had the tetanus shot today 6)  please call for your yearly pap smear and mammogram 7)  Please schedule a follow-up appointment in 1 year, or sooner if needed Prescriptions: VYVANSE 40 MG CAPS (LISDEXAMFETAMINE DIMESYLATE) 1 by mouth once daily  - to fill Mar 13, 2010  #30 x 0   Entered and Authorized by:   Corwin Levins MD   Signed by:   Corwin Levins MD on 03/13/2010   Method used:   Print then Give to Patient    RxID:   [id number]    Orders Added: 1)  EKG w/ Interpretation [93000] 2)  Tdap => 87yrs IM [90715] 3)  Admin 1st Vaccine [90471] 4)  Est. Patient 40-64 years [16010]   Immunizations Administered:  Tetanus Vaccine:    Vaccine Type: Tdap    Site: left deltoid    Mfr: GlaxoSmithKline    Dose: 0.5 ml    Route: IM    Given by: Zella Ball Ewing CMA (AAMA)    Exp. Date: 01/31/2012    Lot #: [id number]    VIS given: 02/29/08 version given March 13, 2010.   Immunizations Administered:  Tetanus Vaccine:    Vaccine Type: Tdap    Site: left deltoid    Mfr: GlaxoSmithKline    Dose: 0.5 ml    Route: IM    Given by: Zella Ball Ewing CMA (AAMA)    Exp. Date: 01/31/2012    Lot #: [id number]    VIS given: 02/29/08 version given March 13, 2010.

## 2010-08-18 ENCOUNTER — Encounter: Payer: Self-pay | Admitting: Internal Medicine

## 2010-08-18 ENCOUNTER — Ambulatory Visit (INDEPENDENT_AMBULATORY_CARE_PROVIDER_SITE_OTHER): Payer: BC Managed Care – PPO | Admitting: Internal Medicine

## 2010-08-18 VITALS — BP 110/72 | HR 68 | Temp 97.6°F | Ht 66.0 in | Wt 164.1 lb

## 2010-08-18 DIAGNOSIS — M25469 Effusion, unspecified knee: Secondary | ICD-10-CM

## 2010-08-18 DIAGNOSIS — Z0001 Encounter for general adult medical examination with abnormal findings: Secondary | ICD-10-CM | POA: Insufficient documentation

## 2010-08-18 DIAGNOSIS — M25462 Effusion, left knee: Secondary | ICD-10-CM | POA: Insufficient documentation

## 2010-08-18 DIAGNOSIS — F329 Major depressive disorder, single episode, unspecified: Secondary | ICD-10-CM

## 2010-08-18 DIAGNOSIS — F3289 Other specified depressive episodes: Secondary | ICD-10-CM

## 2010-08-18 DIAGNOSIS — F988 Other specified behavioral and emotional disorders with onset usually occurring in childhood and adolescence: Secondary | ICD-10-CM

## 2010-08-18 DIAGNOSIS — Z0000 Encounter for general adult medical examination without abnormal findings: Secondary | ICD-10-CM

## 2010-08-18 MED ORDER — AMPHETAMINE-DEXTROAMPHET ER 20 MG PO CP24: 20.0000 mg | ORAL_CAPSULE | Freq: Two times a day (BID) | ORAL | Status: DC

## 2010-08-18 MED ORDER — TRAMADOL HCL 50 MG PO TABS: 50.0000 mg | ORAL_TABLET | Freq: Four times a day (QID) | ORAL | Status: DC | PRN

## 2010-08-18 NOTE — Assessment & Plan Note (Signed)
Quite high suspicious for cartilage/meniscus tear;  For pain med, MRI, and refer Ortho/Dr Margaretha Sheffield per pt request

## 2010-08-18 NOTE — Assessment & Plan Note (Signed)
stable overall by hx and exam, most recent lab reviewed with pt, and pt to continue medical treatment as before  Lab Results  Component Value Date   WBC 6.0 03/11/2010   HGB 13.8 03/11/2010   HCT 40.0 03/11/2010   PLT 258.0 03/11/2010   CHOL 273* 03/11/2010   TRIG 79.0 03/11/2010   HDL 83.00 03/11/2010   LDLDIRECT 158.1 03/11/2010   ALT 18 03/11/2010   AST 18 03/11/2010   NA 140 03/11/2010   K 5.0 03/11/2010   CL 104 03/11/2010   CREATININE 0.8 03/11/2010   BUN 13 03/11/2010   CO2 30 03/11/2010   TSH 1.29 03/11/2010

## 2010-08-18 NOTE — Patient Instructions (Signed)
Stop the vyvanse Start the adderal XR 20 mg twice per day You also had Tramadol for pain sent to the pharmacy if needed Continue all other medications as before You will be contacted regarding the referral for: MRI , and Dr Margaretha Sheffield for the left knee Please return in Nov 2012 with Lab testing done 3-5 days before

## 2010-08-18 NOTE — Assessment & Plan Note (Signed)
Ok to try change to adderall ER 20 bid as she tends to work long or odd hours, consider change to adderal 20 bid if not effective

## 2010-08-18 NOTE — Progress Notes (Signed)
  Subjective:    Patient ID: Robin Farley, female    DOB: 1966-04-03, 45 y.o.   MRN: [id number]  HPI  Here with 1 onset left knee pain and swelling, worse to walk and lie in a certain way at night, also stands at work many hours during the wk; remembers turning quickly at work with onset pain, then swelling since then,, no fever, trauma , no hx of gout.  Did have a sprained knee 6-7 yrs ago with skating but ok since then.  No prior ortho eval or films, no MRI, PT.    Also vyvanse not working as well recently due to increased irritability, though not clear if o/w from overwork as well Pt denies chest pain, increased sob or doe, wheezing, orthopnea, PND, increased LE swelling, palpitations, dizziness or syncope.  Pt denies new neurological symptoms such as new headache, or facial or extremity weakness or numbness   Pt denies polydipsia, polyuria.  Denies worsening depressive symptoms, suicidal ideation, or panic.  Overall good compliance with treatment, and good medicine tolerability.   Review of Systems All otherwise neg per pt     Objective:   Physical Exam BP 110/72  Pulse 68  Temp(Src) 97.6 F (36.4 C) (Oral)  Ht 5\' 6"  (1.676 m)  Wt 164 lb 2 oz (74.447 kg)  BMI 26.49 kg/m2  SpO2 97%  LMP 08/08/2010 Physical Exam  VS noted Constitutional: Pt appears well-developed and well-nourished.  HENT: Head: Normocephalic.  Right Ear: External ear normal.  Left Ear: External ear normal.  Eyes: Conjunctivae and EOM are normal. Pupils are equal, round, and reactive to light.  Neck: Normal range of motion. Neck supple.  Cardiovascular: Normal rate and regular rhythm.   Pulmonary/Chest: Effort normal and breath sounds normal.  Abd:  Soft, NT, non-distended, + BS Neurological: Pt is alert. No cranial nerve deficit.  Skin: Skin is warm. No erythema.  Psychiatric: Pt behavior is normal. Thought content normal.  Not depressed appearing, mild anxious at best Left knee with warmth laterally , mod  effusion, and click with FROM        Assessment & Plan:

## 2010-08-21 ENCOUNTER — Ambulatory Visit (HOSPITAL_COMMUNITY)
Admission: RE | Admit: 2010-08-21 | Discharge: 2010-08-21 | Disposition: A | Discharge status: Home / Self Care | Payer: Worker's Compensation | Source: Ambulatory Visit | Attending: Internal Medicine | Admitting: Internal Medicine

## 2010-08-21 DIAGNOSIS — M25462 Effusion, left knee: Secondary | ICD-10-CM

## 2010-08-21 DIAGNOSIS — M25469 Effusion, unspecified knee: Secondary | ICD-10-CM | POA: Insufficient documentation

## 2010-08-21 DIAGNOSIS — M2240 Chondromalacia patellae, unspecified knee: Secondary | ICD-10-CM | POA: Insufficient documentation

## 2010-08-21 DIAGNOSIS — M25569 Pain in unspecified knee: Secondary | ICD-10-CM | POA: Insufficient documentation

## 2010-08-29 NOTE — H&P (Signed)
[facility]  Patient:    Robin Farley, Robin Farley Visit Number: [id number] MRN: [id number]          Service Type: OBS Location: 9300 [id number] Attending Physician:  Marina Gravel B Adm. Date:  12/07/2000   CC:         [facility]   History and Physical  CHIEF COMPLAINT:                Refractory hyperemesis.  HISTORY OF PRESENT ILLNESS:     The patient is a 45 year old white female, G5, P1-0-3-1, EDD July 03, 2000, at 10+ weeks who presents with recurrent refractory hyperemesis, a 10 pound weight loss.  She has recently been tried on Zofran subcutaneous pump, Zofran IV, intravenous fluids and Reglan p.o. with persistent hyperemesis, weight loss and ketonuria for establishment of IV antiemetic therapy.  PAST MEDICAL HISTORY:           Remarkable for spontaneous abortion in 1986, therapeutic abortion 1990, primary C-section in 1998 and spontaneous pregnancy loss in June 2000.  GYNECOLOGIC HISTORY:            Remarkable for abnormal Pap smear.  ALLERGIES:                      No known drug allergies.  MEDICATIONS:                    Zofran, Zantac, and IV fluid therapy.  FAMILY HISTORY:                 COPD, cardiovascular disease, hypertension, diabetes, thyroid disease and urolithiasis.  PAST SURGICAL HISTORY:          Surgical history is remarkable for deviated septum surgery in 1994.  PRENATAL LABORATORY DATA:       Prenatal lab data reveals a blood type of O positive, Rh antibody negative, rubella immune, hepatitis B surface negative, HIV nonreactive.  PHYSICAL EXAMINATION:  GENERAL:                        She is an ill-appearing white female in no apparent distress.  HEENT:                          Normal.  LUNGS:                          Clear.  HEART:                          Regular rhythm.  ABDOMEN:                        Soft, nontender.  PELVIC:                         Exam reveals a 10 week intrauterine pregnancy.  Fetal  heart tones noted.  EXTREMITIES:                    No cords.  NEUROLOGICAL:                   Exam is nonfocal.  IMPRESSION:                     Refractory hyperemesis with previous stable electrolytes.  PLAN:  Proceed with IV fluid therapy, possible PPN. Solu-Medrol protocol discussed and will be offered and established as home IV therapy.  The patient acknowledges and desires to proceed. Attending Physician:  Marina Gravel B DD:  12/08/00 TD:  12/08/00 Job: [id number]

## 2010-08-29 NOTE — H&P (Signed)
[facility]  Patient:    Robin Farley, Robin Farley Visit Number: [id number] MRN: [id number]          Service Type: Attending:  Lenoard Aden, M.D. Dictated by:   Lenoard Aden, M.D. Adm. Date:  07/13/01   CC:         [facility]   History and Physical  CHIEF COMPLAINT:              Postdates.  HISTORY OF PRESENT ILLNESS:   A 45 year old white female, G5, P1, EDD July 01, 2001, at 41-5/7ths weeks who presents for postdates induction.  Patient has no known drug allergies.  MEDICATIONS:                  Prenatal vitamins.  OBSTETRIC HISTORY:            SAB 1986, TAB 1990, primary C section for failure to descend 1998, spontaneous abortion in June 2000.  No other medical or surgical hospitalizations except for deviated septum surgery in 1994.  FAMILY HISTORY:               Autism, drug abuse, myocardial infarctions, hypertension, and chronic obstructive pulmonary disease.  PRENATAL LAB DATA:            Blood type O positive, Rh antibody negative, rubella immune, hepatitis B surface antigen negative.  HIV nonreactive.  PHYSICAL EXAMINATION:  GENERAL:                      She is a well-developed, well-nourished white female in no apparent distress.  HEENT:                        Normal.  LUNGS:                        Clear.  HEART:                        Regular rhythm.  ABDOMEN:                      Soft, gravid, nontender.  Estimated fetal weight of 7-1/2 pounds.  PELVIC:                       Cervix is 4 cm, 80% vertex, and -1.  EXTREMITIES:                  No cords.  NEUROLOGIC:                   Exam nonfocal.  IMPRESSION:                   Postdates intrauterine pregnancy for induction.  PLAN:                         Artificial rupture of membranes, possible Pitocin augmentation anticipated, attempts at vaginal delivery. Dictated by:   Lenoard Aden, M.D. Attending:  Lenoard Aden, M.D. DD:  07/13/01 TD:   07/13/01 Job: [id number]

## 2010-08-29 NOTE — Discharge Summary (Signed)
[facility]  Patient:    MYREL, RAPPLEYE Visit Number: [id number] MRN: [id number]          Service Type: MED Location: 9300 [id number] [id number] Attending Physician:  Ermalene Searing Dictated by:   Lenoard Aden, M.D. Admit Date:  12/07/2000 Discharge Date: 12/11/2000                             Discharge Summary  HOSPITAL COURSE:              The patient underwent an admission for refractory hyperemesis and IV steroid protocol.  She responded well with IV steroids and Zantac and Reglan.  She was discharged to home on hospital day #4 switched over to oral regime.  Precautions given.  Follow up in the office in one week. Dictated by:   Lenoard Aden, M.D. Attending Physician:  Marina Gravel B DD:  01/01/01 TD:  01/01/01 Job: [id number]

## 2010-09-12 ENCOUNTER — Ambulatory Visit: Payer: Worker's Compensation | Admitting: Internal Medicine

## 2010-09-15 ENCOUNTER — Other Ambulatory Visit (INDEPENDENT_AMBULATORY_CARE_PROVIDER_SITE_OTHER): Payer: BC Managed Care – PPO

## 2010-09-15 ENCOUNTER — Ambulatory Visit (INDEPENDENT_AMBULATORY_CARE_PROVIDER_SITE_OTHER): Payer: BC Managed Care – PPO | Admitting: Internal Medicine

## 2010-09-15 ENCOUNTER — Encounter: Payer: Self-pay | Admitting: Internal Medicine

## 2010-09-15 VITALS — BP 102/70 | HR 70 | Temp 98.0°F | Ht 66.0 in | Wt 165.5 lb

## 2010-09-15 DIAGNOSIS — M25469 Effusion, unspecified knee: Secondary | ICD-10-CM

## 2010-09-15 DIAGNOSIS — R109 Unspecified abdominal pain: Secondary | ICD-10-CM

## 2010-09-15 DIAGNOSIS — R197 Diarrhea, unspecified: Secondary | ICD-10-CM

## 2010-09-15 DIAGNOSIS — M25462 Effusion, left knee: Secondary | ICD-10-CM

## 2010-09-15 DIAGNOSIS — R110 Nausea: Secondary | ICD-10-CM | POA: Insufficient documentation

## 2010-09-15 LAB — BASIC METABOLIC PANEL
BUN: 10 mg/dL (ref 6–23)
Calcium: 9.2 mg/dL (ref 8.4–10.5)
GFR: 86.65 mL/min (ref >60.00)
Glucose, Bld: 64 mg/dL — ABNORMAL LOW (ref 70–99)
Potassium: 4.4 mEq/L (ref 3.5–5.1)

## 2010-09-15 LAB — URINALYSIS, ROUTINE W REFLEX MICROSCOPIC
Bilirubin Urine: NEGATIVE
Ketones, ur: NEGATIVE
pH: 5 (ref 5.0–8.0)

## 2010-09-15 LAB — HEPATIC FUNCTION PANEL
ALT: 12 U/L (ref 0–35)
AST: 16 U/L (ref 0–37)
Alkaline Phosphatase: 52 U/L (ref 39–117)
Bilirubin, Direct: 0.1 mg/dL (ref 0.0–0.3)
Total Bilirubin: 0.5 mg/dL (ref 0.3–1.2)

## 2010-09-15 LAB — CBC WITH DIFFERENTIAL/PLATELET
Eosinophils Relative: 1.6 % (ref 0.0–5.0)
Monocytes Relative: 7.4 % (ref 3.0–12.0)
Neutrophils Relative %: 59.1 % (ref 43.0–77.0)
Platelets: 263 10*3/uL (ref 150.0–400.0)
WBC: 5.1 10*3/uL (ref 4.5–10.5)

## 2010-09-15 MED ORDER — PROMETHAZINE HCL 25 MG PO TABS: 25.0000 mg | ORAL_TABLET | Freq: Four times a day (QID) | ORAL | Status: AC | PRN

## 2010-09-15 MED ORDER — METRONIDAZOLE 250 MG PO TABS: 250.0000 mg | ORAL_TABLET | Freq: Three times a day (TID) | ORAL | Status: AC

## 2010-09-15 NOTE — Assessment & Plan Note (Addendum)
See diarrhea discussion, to GI if symptoms persist or worsen, I dont seen need for imaging at this time; will check cbc, bmet today

## 2010-09-15 NOTE — Assessment & Plan Note (Signed)
Mild, likely related to other GI symtpoms, for phenergan prn, and gave samples prilosec 20 mg bid prn

## 2010-09-15 NOTE — Progress Notes (Signed)
Subjective:    Patient ID: Robin Farley, female    DOB: 1965/07/22, 45 y.o.   MRN: [id number]  HPI  Here with c/o acute onset abd crampy pain, mild to mod for 2-3 wks ongoing; assoc with nausea, watery daily stools (sometimes more than one), gas /bloated feeling and unusual heartburn and regurgitation like symptoms of food, but denies fever, vomiting, back pain, constipation, chills, wt loss, dysphagia, or blood.  No prior hx of this in past.  No sick contacts.  Did have contact with mother's well water 2 wks ago, but mother not ill and only drank after onset symptoms anyway.  No HIV risk per pt, and overall not getting worse, just not getting better.  Did see ortho - opting for conservative tx for the left knee with NSAID which has seemed to work well, and effusion better, and diarrhea did seem to start about 3 days into the start of the nsaid, but she stopped the nsaid almost immed and diarrhea persists.  Past Medical History  Diagnosis Date  . HYPERLIPIDEMIA 02/23/2007  . ANEMIA 02/05/2007  . DEPRESSION 02/23/2007  . ADD 03/13/2010  . Acute sinusitis, unspecified 02/26/2007  . Deviated nasal septum 02/05/2007  . ALLERGIC RHINITIS 02/23/2007  . GERD 12/27/2008  . UTI 02/23/2007  . FATIGUE 02/23/2007  . SWELLING MASS OR LUMP IN HEAD AND NECK 07/27/2008  . Abdominal pain, generalized 12/27/2008  . ATTENTION DEFICIT DISORDER, HX OF 02/05/2007   Past Surgical History  Procedure Date  . Nasal septum surgery   . Cesarean section     reports that she has never smoked. She does not have any smokeless tobacco history on file. She reports that she drinks alcohol. She reports that she does not use illicit drugs. family history includes Alcohol abuse in her other; Atrial fibrillation in her father and sister; COPD in her father; Depression in her other; Glaucoma in her other; and Hypertension in her mother. Allergies  Allergen Reactions  . Promethazine Hcl    Current Outpatient Prescriptions on  File Prior to Visit  Medication Sig Dispense Refill  . aspirin 81 MG EC tablet Take 81 mg by mouth daily.        Marland Kitchen amphetamine-dextroamphetamine (ADDERALL XR, 20MG ,) 20 MG 24 hr capsule Take 1 capsule (20 mg total) by mouth 2 (two) times daily.  60 capsule  0  . drospirenone-ethinyl estradiol (YAZ) 3-0.02 MG per tablet Take 1 tablet by mouth daily.        . traMADol (ULTRAM) 50 MG tablet Take 1 tablet (50 mg total) by mouth every 6 (six) hours as needed for pain.  60 tablet  1   Review of Systems All otherwise neg per pt     Objective:   Physical Exam BP 102/70  Pulse 70  Temp(Src) 98 F (36.7 C) (Oral)  Ht 5\' 6"  (1.676 m)  Wt 165 lb 8 oz (75.07 kg)  BMI 26.71 kg/m2  SpO2 98%  LMP 09/08/2010 Physical Exam  VS noted Constitutional: Pt is oriented to person, place, and time. Appears well-developed and well-nourished.  HENT:  Head: Normocephalic and atraumatic.  Right Ear: External ear normal.  Left Ear: External ear normal.  Nose: Nose normal.  Mouth/Throat: Oropharynx is clear and moist.  Eyes: Conjunctivae and EOM are normal. Pupils are equal, round, and reactive to light.  Neck: Normal range of motion. Neck supple. No JVD present. No tracheal deviation present.  Cardiovascular: Normal rate, regular rhythm, normal heart sounds and intact distal  pulses.   Pulmonary/Chest: Effort normal and breath sounds normal.  Abdominal: Soft. Bowel sounds are normal. There is no tenderness. excpt for minor to mild tender epigastric area and right mid lateral abd, but non-distended, no guarding or rebound Musculoskeletal: Normal range of motion. Exhibits no edema.  Left knee with no effusion, NT, FROM Lymphadenopathy:  Has no cervical adenopathy. or other neck Skin: Skin is warm and dry. No rash noted.  Psychiatric:  Has  normal mood and affect. Behavior is normal.         Assessment & Plan:

## 2010-09-15 NOTE — Assessment & Plan Note (Signed)
Mild to mod symptoms overall but persistent, doubt functional in this case though she has no fever, doubt med related;  Will tx with empiric flagyl though unlikely to be c diff, and check stool for cx, ova and parasite - ? Giardia;  Could also be viral, doubt other infectious such as HIV related or fungal;  Consider GI if persists or worsens

## 2010-09-15 NOTE — Patient Instructions (Signed)
Take all new medications as prescribed - the nausea medication, the generic flagyl for infection, samples of prilosec at 1-2 per day for antacid, and OTC Immodium as needed for the diarrhea Continue all other medications as before Please go to LAB in the Basement for the blood and/or urine tests to be done today Please call the phone number [phone] (the PhoneTree System) for results of testing in 2-3 days;  When calling, simply dial the number, and when prompted enter the MRN number above (the Medical Record Number) and the # key, then the message should start.

## 2010-09-15 NOTE — Assessment & Plan Note (Signed)
Marked improved, ok for tylenol prn for now

## 2010-09-22 ENCOUNTER — Telehealth: Payer: Self-pay

## 2010-09-22 NOTE — Telephone Encounter (Signed)
[facility] Triage Call Report Triage Record Num: [id number] Operator: Di Kindle Patient Name: Robin Farley Call Date & Time: 09/20/2010 2:48:57PM Patient Phone: [phone] PCP: Oliver Barre Patient Gender: Female PCP Fax : [phone] Patient DOB: October 18, 1965 Practice Name: [facility] Reason for Call: "Gillis Santa, calling regarding RX for phenergan, seen last week, Other. PCP is Oliver Barre. Callback number is [phone]. " Call to report Onset 09/20/10 of vomiting, states unable to keep the PO Phenergan down, last urine 1430, afebrile, unsure what is causing the vomiting, was told may be viral. Now has a headache also with sinus drainage. [facility] [phone]. Per office profile, call to [facility]: Edie to advise may change RX for Phenergan from PO to PR. Phenergan 25 mg PR q 4 hrs as needed, # 6, no refills. Advised of need to call office 09/22/10. Protocol(s) Used: Medication Question Calls, No Triage (Adults) Recommended Outcome per Protocol: Provide Information or Advice Only Reason for Outcome: Caller has medication question only and triager answers question Care Advice: ~ 09/20/2010 3:05:34PM Page 1 of 1 [id number]

## 2011-02-18 ENCOUNTER — Other Ambulatory Visit (INDEPENDENT_AMBULATORY_CARE_PROVIDER_SITE_OTHER): Payer: BC Managed Care – PPO

## 2011-02-18 ENCOUNTER — Ambulatory Visit (INDEPENDENT_AMBULATORY_CARE_PROVIDER_SITE_OTHER): Payer: BC Managed Care – PPO | Admitting: Internal Medicine

## 2011-02-18 ENCOUNTER — Encounter: Payer: Self-pay | Admitting: Internal Medicine

## 2011-02-18 ENCOUNTER — Other Ambulatory Visit: Payer: Self-pay | Admitting: Internal Medicine

## 2011-02-18 VITALS — BP 102/64 | HR 75 | Temp 98.1°F | Ht 66.5 in | Wt 174.1 lb

## 2011-02-18 DIAGNOSIS — F329 Major depressive disorder, single episode, unspecified: Secondary | ICD-10-CM

## 2011-02-18 DIAGNOSIS — F32A Depression, unspecified: Secondary | ICD-10-CM

## 2011-02-18 DIAGNOSIS — Z0000 Encounter for general adult medical examination without abnormal findings: Secondary | ICD-10-CM

## 2011-02-18 DIAGNOSIS — F988 Other specified behavioral and emotional disorders with onset usually occurring in childhood and adolescence: Secondary | ICD-10-CM

## 2011-02-18 HISTORY — DX: Depression, unspecified: F32.A

## 2011-02-18 LAB — URINALYSIS, ROUTINE W REFLEX MICROSCOPIC
Ketones, ur: NEGATIVE
Specific Gravity, Urine: 1.025 (ref 1.000–1.030)
Urine Glucose: NEGATIVE
pH: 6.5 (ref 5.0–8.0)

## 2011-02-18 LAB — CBC WITH DIFFERENTIAL/PLATELET
Basophils Relative: 0.4 % (ref 0.0–3.0)
Eosinophils Relative: 1.3 % (ref 0.0–5.0)
HCT: 37.2 % (ref 36.0–46.0)
Hemoglobin: 12.8 g/dL (ref 12.0–15.0)
Lymphs Abs: 1.6 10*3/uL (ref 0.7–4.0)
Monocytes Relative: 8.4 % (ref 3.0–12.0)
Neutro Abs: 2.9 10*3/uL (ref 1.4–7.7)
RBC: 4.09 Mil/uL (ref 3.87–5.11)
WBC: 4.9 10*3/uL (ref 4.5–10.5)

## 2011-02-18 LAB — BASIC METABOLIC PANEL
BUN: 13 mg/dL (ref 6–23)
Calcium: 9.5 mg/dL (ref 8.4–10.5)
Creatinine, Ser: 0.9 mg/dL (ref 0.4–1.2)
GFR: 69.38 mL/min (ref >60.00)
Glucose, Bld: 86 mg/dL (ref 70–99)
Sodium: 143 mEq/L (ref 135–145)

## 2011-02-18 LAB — LIPID PANEL
HDL: 68.1 mg/dL (ref >39.00)
Triglycerides: 74 mg/dL (ref 0.0–149.0)

## 2011-02-18 LAB — HEPATIC FUNCTION PANEL
Albumin: 3.8 g/dL (ref 3.5–5.2)
Alkaline Phosphatase: 56 U/L (ref 39–117)

## 2011-02-18 LAB — LDL CHOLESTEROL, DIRECT: Direct LDL: 177.8 mg/dL

## 2011-02-18 MED ORDER — AMPHETAMINE-DEXTROAMPHETAMINE 20 MG PO TABS: 20.0000 mg | ORAL_TABLET | Freq: Every day | ORAL | Status: DC

## 2011-02-18 MED ORDER — ESCITALOPRAM OXALATE 10 MG PO TABS: 10.0000 mg | ORAL_TABLET | Freq: Every day | ORAL | Status: AC

## 2011-02-18 NOTE — Assessment & Plan Note (Signed)
Ok to re-start the ADD med from previous; adderall 20 bid

## 2011-02-18 NOTE — Assessment & Plan Note (Signed)
Mild, for lexapro trial, also refer to counseling per pt request, o/w stable overall by hx and exam

## 2011-02-18 NOTE — Assessment & Plan Note (Addendum)
Overall doing well, age appropriate education and counseling updated, referrals for preventative services and immunizations addressed, dietary and smoking counseling addressed, most recent labs and ECG reviewed.  I have personally reviewed and have noted: 1) the patient's medical and social history 2) The pt's use of alcohol, tobacco, and illicit drugs 3) The patient's current medications and supplements 4) Functional ability including ADL's, fall risk, home safety risk, hearing and visual impairment 5) Diet and physical activities 6) Evidence for depression or mood disorder 7) The patient's height, weight, and BMI have been recorded in the chart I have made referrals, and provided counseling and education based on review of the above For labs today, and pt needs pap/mammo with GYN - she plans to self refer, declines flu shot today

## 2011-02-18 NOTE — Patient Instructions (Addendum)
Take all new medications as prescribed Please call for refills as you need Continue all other medications as before Please go to LAB in the Basement for the blood and/or urine tests to be done today Please call the phone number [phone] (the PhoneTree System) for results of testing in 2-3 days;  When calling, simply dial the number, and when prompted enter the MRN number above (the Medical Record Number) and the # key, then the message should start. Please remember to followup with your GYN for the yearly pap smear and/or mammogram Please return in 1 year for your yearly visit, or sooner if needed, with Lab testing done 3-5 days before

## 2011-02-22 ENCOUNTER — Encounter: Payer: Self-pay | Admitting: Internal Medicine

## 2011-02-22 NOTE — Progress Notes (Signed)
Subjective:    Patient ID: Robin Farley, female    DOB: 06-03-1965, 44 y.o.   MRN: [id number]  HPI  Here for wellness and f/u;  Overall doing ok;  Pt denies CP, worsening SOB, DOE, wheezing, orthopnea, PND, worsening LE edema, palpitations, dizziness or syncope.  Pt denies neurological change such as new Headache, facial or extremity weakness.  Pt denies polydipsia, polyuria, or low sugar symptoms. Pt states overall good compliance with treatment and medications, good tolerability, and trying to follow lower cholesterol diet.  No fever, wt loss, night sweats, loss of appetite, or other constitutional symptoms.  Pt states good ability with ADL's, low fall risk, home safety reviewed and adequate, no significant changes in hearing or vision, and occasionally active with exercise.  ADD med working well, needs refills, functioning well social and work related.  Has had worsening depressive symptoms however, mild to mod, but no suicidal ideation, or panic, though has ongoing anxiety, not increased recently.   Past Medical History  Diagnosis Date  . HYPERLIPIDEMIA 02/23/2007  . ANEMIA 02/05/2007  . DEPRESSION 02/23/2007  . ADD 03/13/2010  . Acute sinusitis, unspecified 02/26/2007  . Deviated nasal septum 02/05/2007  . ALLERGIC RHINITIS 02/23/2007  . GERD 12/27/2008  . UTI 02/23/2007  . FATIGUE 02/23/2007  . SWELLING MASS OR LUMP IN HEAD AND NECK 07/27/2008  . Abdominal pain, generalized 12/27/2008  . ATTENTION DEFICIT DISORDER, HX OF 02/05/2007  . Depression 02/18/2011   Past Surgical History  Procedure Date  . Nasal septum surgery   . Cesarean section     reports that she has never smoked. She does not have any smokeless tobacco history on file. She reports that she drinks alcohol. She reports that she does not use illicit drugs. family history includes Alcohol abuse in her other; Atrial fibrillation in her father and sister; COPD in her father; Depression in her other; Glaucoma in her other; and  Hypertension in her mother. Allergies  Allergen Reactions  . Promethazine Hcl    Current Outpatient Prescriptions on File Prior to Visit  Medication Sig Dispense Refill  . aspirin 81 MG EC tablet Take 81 mg by mouth daily.         Review of Systems Review of Systems  Constitutional: Negative for diaphoresis, activity change, appetite change and unexpected weight change.  HENT: Negative for hearing loss, ear pain, facial swelling, mouth sores and neck stiffness.   Eyes: Negative for pain, redness and visual disturbance.  Respiratory: Negative for shortness of breath and wheezing.   Cardiovascular: Negative for chest pain and palpitations.  Gastrointestinal: Negative for diarrhea, blood in stool, abdominal distention and rectal pain.  Genitourinary: Negative for hematuria, flank pain and decreased urine volume.  Musculoskeletal: Negative for myalgias and joint swelling.  Skin: Negative for color change and wound.  Neurological: Negative for syncope and numbness.  Hematological: Negative for adenopathy.  Psychiatric/Behavioral: Negative for hallucinations, self-injury, decreased concentration and agitation.        Objective:   Physical Exam BP 102/64  Pulse 75  Temp(Src) 98.1 F (36.7 C) (Oral)  Ht 5' 6.5" (1.689 m)  Wt 174 lb 2 oz (78.983 kg)  BMI 27.68 kg/m2  SpO2 97%  LMP 02/16/2011 Physical Exam  VS noted Constitutional: Pt is oriented to person, place, and time. Appears well-developed and well-nourished.  HENT:  Head: Normocephalic and atraumatic.  Right Ear: External ear normal.  Left Ear: External ear normal.  Nose: Nose normal.  Mouth/Throat: Oropharynx is clear and moist.  Eyes: Conjunctivae and EOM are normal. Pupils are equal, round, and reactive to light.  Neck: Normal range of motion. Neck supple. No JVD present. No tracheal deviation present.  Cardiovascular: Normal rate, regular rhythm, normal heart sounds and intact distal pulses.   Pulmonary/Chest: Effort  normal and breath sounds normal.  Abdominal: Soft. Bowel sounds are normal. There is no tenderness.  Musculoskeletal: Normal range of motion. Exhibits no edema.  Lymphadenopathy:  Has no cervical adenopathy.  Neurological: Pt is alert and oriented to person, place, and time. Pt has normal reflexes. No cranial nerve deficit.  Skin: Skin is warm and dry. No rash noted.  Psychiatric:  Has  normal mood and affect. Behavior is normal.     Assessment & Plan:

## 2011-04-17 ENCOUNTER — Encounter: Payer: Self-pay | Admitting: Internal Medicine

## 2011-04-17 ENCOUNTER — Ambulatory Visit (INDEPENDENT_AMBULATORY_CARE_PROVIDER_SITE_OTHER): Payer: BC Managed Care – PPO | Admitting: Internal Medicine

## 2011-04-17 VITALS — BP 110/62 | HR 84 | Temp 98.1°F | Resp 16 | Wt 166.0 lb

## 2011-04-17 DIAGNOSIS — J069 Acute upper respiratory infection, unspecified: Secondary | ICD-10-CM

## 2011-04-17 MED ORDER — IBUPROFEN 600 MG PO TABS: ORAL_TABLET | ORAL | Status: AC

## 2011-04-17 MED ORDER — AMOXICILLIN 500 MG PO CAPS: 1000.0000 mg | ORAL_CAPSULE | Freq: Two times a day (BID) | ORAL | Status: AC

## 2011-04-17 MED ORDER — PROMETHAZINE-CODEINE 6.25-10 MG/5ML PO SYRP: 5.0000 mL | ORAL_SOLUTION | ORAL | Status: AC | PRN

## 2011-04-17 MED ORDER — FLUCONAZOLE 150 MG PO TABS: 150.0000 mg | ORAL_TABLET | Freq: Once | ORAL | Status: AC

## 2011-04-17 NOTE — Assessment & Plan Note (Signed)
see Meds

## 2011-04-17 NOTE — Patient Instructions (Signed)
Use over-the-counter  "cold" medicines  such as "Tylenol cold" , "Advil cold",  "Mucinex" or" Mucinex D"  for cough and congestion.   Avoid decongestants if you have high blood pressure and use "Afrin" nasal spray for nasal congestion as directed instead. Use" Delsym" or" Robitussin" cough syrup varietis for cough.  You can use plain "Tylenol" or "Advil" for fever, chills and achyness.  

## 2011-04-17 NOTE — Progress Notes (Signed)
  Subjective:    Patient ID: Robin Farley, female    DOB: 1965/06/28, 46 y.o.   MRN: [id number]  HPI  HPI  C/o URI sx's x  12 days. C/o ST, cough, weakness. Not better with OTC medicines. Actually, the patient is getting worse. The patient did not sleep last night due to cough.  Review of Systems  Constitutional: Positive for fever, chills and fatigue.  HENT: Positive for congestion, rhinorrhea, sneezing and postnasal drip - yellow.   Eyes: Positive for photophobia and pain. Negative for discharge and visual disturbance.  Respiratory: Positive for cough  Gastrointestinal: Negative for vomiting, abdominal pain, diarrhea and abdominal distention.  Genitourinary: Negative for dysuria and difficulty urinating.  Skin: Negative for rash.  Neurological: Positive for dizziness, weakness and light-headedness.       Review of Systems     Objective:   Physical Exam  Constitutional: She appears well-developed. No distress.  HENT:  Head: Normocephalic.  Right Ear: External ear normal.  Left Ear: External ear normal.  Nose: Nose normal.  Mouth/Throat: No oropharyngeal exudate.       eryth nasal mucosa  Eyes: Conjunctivae are normal. Pupils are equal, round, and reactive to light. Right eye exhibits no discharge. Left eye exhibits no discharge.  Neck: Normal range of motion. Neck supple. No JVD present. No tracheal deviation present. No thyromegaly present.  Cardiovascular: Normal rate, regular rhythm and normal heart sounds.   Pulmonary/Chest: No stridor. No respiratory distress. She has no wheezes.  Abdominal: Soft. Bowel sounds are normal. She exhibits no distension and no mass. There is no tenderness. There is no rebound and no guarding.  Musculoskeletal: She exhibits no edema and no tenderness.  Lymphadenopathy:    She has no cervical adenopathy.  Neurological: She displays normal reflexes. No cranial nerve deficit. She exhibits normal muscle tone. Coordination normal.  Skin: No  rash noted. No erythema.  Psychiatric: She has a normal mood and affect. Her behavior is normal. Judgment and thought content normal.          Assessment & Plan:

## 2011-04-20 ENCOUNTER — Ambulatory Visit: Payer: BC Managed Care – PPO | Admitting: Internal Medicine

## 2011-07-08 ENCOUNTER — Encounter: Payer: Self-pay | Admitting: Internal Medicine

## 2011-07-08 ENCOUNTER — Ambulatory Visit (INDEPENDENT_AMBULATORY_CARE_PROVIDER_SITE_OTHER): Payer: Managed Care, Other (non HMO) | Admitting: Internal Medicine

## 2011-07-08 VITALS — BP 112/80 | HR 88 | Temp 98.8°F | Ht 66.0 in | Wt 164.1 lb

## 2011-07-08 DIAGNOSIS — M25519 Pain in unspecified shoulder: Secondary | ICD-10-CM

## 2011-07-08 DIAGNOSIS — F988 Other specified behavioral and emotional disorders with onset usually occurring in childhood and adolescence: Secondary | ICD-10-CM

## 2011-07-08 DIAGNOSIS — M25511 Pain in right shoulder: Secondary | ICD-10-CM

## 2011-07-08 DIAGNOSIS — L719 Rosacea, unspecified: Secondary | ICD-10-CM | POA: Insufficient documentation

## 2011-07-08 DIAGNOSIS — R238 Other skin changes: Secondary | ICD-10-CM

## 2011-07-08 DIAGNOSIS — L819 Disorder of pigmentation, unspecified: Secondary | ICD-10-CM

## 2011-07-08 DIAGNOSIS — J019 Acute sinusitis, unspecified: Secondary | ICD-10-CM

## 2011-07-08 MED ORDER — TRETINOIN 0.025 % EX CREA: TOPICAL_CREAM | Freq: Every day | CUTANEOUS | Status: DC

## 2011-07-08 MED ORDER — AMPHETAMINE-DEXTROAMPHETAMINE 20 MG PO TABS: 20.0000 mg | ORAL_TABLET | Freq: Every day | ORAL | Status: DC

## 2011-07-08 MED ORDER — AZITHROMYCIN 250 MG PO TABS: ORAL_TABLET | ORAL | Status: AC

## 2011-07-08 MED ORDER — NAPROXEN 500 MG PO TABS: 500.0000 mg | ORAL_TABLET | Freq: Two times a day (BID) | ORAL | Status: DC

## 2011-07-08 MED ORDER — HYDROCODONE-HOMATROPINE 5-1.5 MG/5ML PO SYRP: 5.0000 mL | ORAL_SOLUTION | Freq: Four times a day (QID) | ORAL | Status: AC | PRN

## 2011-07-08 NOTE — Assessment & Plan Note (Signed)
Ok for retin a,  to f/u any worsening symptoms or concerns

## 2011-07-08 NOTE — Progress Notes (Signed)
Subjective:    Patient ID: Robin Farley, female    DOB: 08/24/1965, 46 y.o.   MRN: [id number]  HPI  Here to f/u -  Here with 3 days acute onset fever, facial pain, pressure, general weakness and malaise, and greenish d/c, with slight ST, but little to no cough and Pt denies chest pain, increased sob or doe, wheezing, orthopnea, PND, increased LE swelling, palpitations, dizziness or syncope.  Pt denies new neurological symptoms such as new headache, or facial or extremity weakness or numbness, except does have what sounds RLS vs PLMD at night but not disrupting enough with sleep that she feels tx is necessary.  Does have mild right shoudler aching and pain but has FROM, aches mosly at night and has to sleep on left side more, but shoulder itself not tender.  ADD meds working well with concentration and task completion - needs refills.  Also mentions recently 2 menses in one mo, worried about pre-menopause but has not yet seen GYN.  Also with worsening fine wrinkles around eyes, and facial discoloration, and roughness, asks for Retin A. Past Medical History  Diagnosis Date  . HYPERLIPIDEMIA 02/23/2007  . ANEMIA 02/05/2007  . DEPRESSION 02/23/2007  . ADD 03/13/2010  . Acute sinusitis, unspecified 02/26/2007  . Deviated nasal septum 02/05/2007  . ALLERGIC RHINITIS 02/23/2007  . GERD 12/27/2008  . UTI 02/23/2007  . FATIGUE 02/23/2007  . SWELLING MASS OR LUMP IN HEAD AND NECK 07/27/2008  . Abdominal pain, generalized 12/27/2008  . ATTENTION DEFICIT DISORDER, HX OF 02/05/2007  . Depression 02/18/2011   Past Surgical History  Procedure Date  . Nasal septum surgery   . Cesarean section     reports that she has never smoked. She does not have any smokeless tobacco history on file. She reports that she drinks alcohol. She reports that she does not use illicit drugs. family history includes Alcohol abuse in her other; Atrial fibrillation in her father and sister; COPD in her father; Depression in her  other; Glaucoma in her other; and Hypertension in her mother. Allergies  Allergen Reactions  . Promethazine Hcl    Current Outpatient Prescriptions on File Prior to Visit  Medication Sig Dispense Refill  . aspirin 81 MG EC tablet Take 81 mg by mouth daily.        Marland Kitchen DISCONTD: amphetamine-dextroamphetamine (ADDERALL, 20MG ,) 20 MG tablet Take 1 tablet (20 mg total) by mouth daily. To fill Apr 19, 2011  60 tablet  0  . amphetamine-dextroamphetamine (ADDERALL XR, 20MG ,) 20 MG 24 hr capsule Take 1 capsule (20 mg total) by mouth 2 (two) times daily.  60 capsule  0   Review of Systems Review of Systems  Constitutional: Negative for diaphoresis and unexpected weight change.  HENT: Negative for drooling and tinnitus.   Eyes: Negative for photophobia and visual disturbance.  Respiratory: Negative for choking and stridor.   Gastrointestinal: Negative for vomiting and blood in stool.  Genitourinary: Negative for hematuria and decreased urine volume.  Musculoskeletal: Negative for gait problem.    Objective:   Physical Exam BP 112/80  Pulse 88  Temp(Src) 98.8 F (37.1 C) (Oral)  Ht 5\' 6"  (1.676 m)  Wt 164 lb 2 oz (74.447 kg)  BMI 26.49 kg/m2  SpO2 97% Physical Exam  VS noted, mil dill Constitutional: Pt appears well-developed and well-nourished.  HENT: Head: Normocephalic.  Right Ear: External ear normal.  Left Ear: External ear normal.  Bilat tm's mild erythema.  Sinus tender.  Pharynx mild  erythema Eyes: Conjunctivae and EOM are normal. Pupils are equal, round, and reactive to light.  Neck: Normal range of motion. Neck supple.  Cardiovascular: Normal rate and regular rhythm.   Pulmonary/Chest: Effort normal and breath sounds normal.  Neurological: Pt is alert. No cranial nerve deficit. motor/gait intact Skin: Skin is warm. No erythema. Has fine wrinkles, roughness and skin discoloration of the facies bilat below the eyes Psychiatric: Pt behavior is normal. Thought content normal.  Right  shoulder NT with FROM, no rash or swelling    Assessment & Plan:

## 2011-07-08 NOTE — Assessment & Plan Note (Signed)
Mild to mod, for antibx course,  to f/u any worsening symptoms or concerns 

## 2011-07-08 NOTE — Assessment & Plan Note (Signed)
stable overall by hx and exam,, and pt to continue medical treatment as before , refills done today 

## 2011-07-08 NOTE — Assessment & Plan Note (Signed)
C/w mild rot cuff tendonitis possible, for nsaid prn, exam with minimal findings, consdier ortho if worsens or persists

## 2011-07-08 NOTE — Patient Instructions (Signed)
Take all new medications as prescribed -  Please be aware the Retin A might require prior authorization, or you may need to see Dermatology to have this approved by insurance Continue all other medications as before You are given the refills today of adderal Please see GYN for the irregular menses if this persists

## 2011-11-17 ENCOUNTER — Ambulatory Visit (INDEPENDENT_AMBULATORY_CARE_PROVIDER_SITE_OTHER): Payer: Managed Care, Other (non HMO) | Admitting: Internal Medicine

## 2011-11-17 ENCOUNTER — Encounter: Payer: Self-pay | Admitting: Internal Medicine

## 2011-11-17 VITALS — BP 100/70 | HR 75 | Temp 97.1°F | Ht 66.5 in | Wt 154.1 lb

## 2011-11-17 DIAGNOSIS — F988 Other specified behavioral and emotional disorders with onset usually occurring in childhood and adolescence: Secondary | ICD-10-CM

## 2011-11-17 DIAGNOSIS — F411 Generalized anxiety disorder: Secondary | ICD-10-CM

## 2011-11-17 DIAGNOSIS — F329 Major depressive disorder, single episode, unspecified: Secondary | ICD-10-CM

## 2011-11-17 DIAGNOSIS — F419 Anxiety disorder, unspecified: Secondary | ICD-10-CM

## 2011-11-17 HISTORY — DX: Anxiety disorder, unspecified: F41.9

## 2011-11-17 MED ORDER — ALPRAZOLAM 1 MG PO TABS: ORAL_TABLET | ORAL | Status: DC
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
## 2011-11-17 NOTE — Assessment & Plan Note (Signed)
stable overall by hx and exam, most recent data reviewed with pt, and pt to continue medical treatment as before Lab Results  Component Value Date   WBC 4.9 02/18/2011   HGB 12.8 02/18/2011   HCT 37.2 02/18/2011   PLT 280.0 02/18/2011   GLUCOSE 86 02/18/2011   CHOL 248* 02/18/2011   TRIG 74.0 02/18/2011   HDL 68.10 02/18/2011   LDLDIRECT 177.8 02/18/2011   ALT 11 02/18/2011   AST 14 02/18/2011   NA 143 02/18/2011   K 4.4 02/18/2011   CL 108 02/18/2011   CREATININE 0.9 02/18/2011   BUN 13 02/18/2011   CO2 29 02/18/2011   TSH 1.70 02/18/2011
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
## 2011-11-17 NOTE — Patient Instructions (Addendum)
Please call psychiatry to see if they take tricare for brother, such as [facility] If this does not work out, other considerations would be [facility] (for urgent assessments), the ER or even [facility] I would recommend considering an appointment for yourself for counseling as well Take all new medications as prescribed Continue all other medications as before

## 2011-11-17 NOTE — Assessment & Plan Note (Signed)
stable overall by hx and exam, and pt to continue medical treatment as before 

## 2011-12-04 ENCOUNTER — Ambulatory Visit (INDEPENDENT_AMBULATORY_CARE_PROVIDER_SITE_OTHER): Payer: Managed Care, Other (non HMO) | Admitting: Internal Medicine

## 2011-12-04 ENCOUNTER — Encounter: Payer: Self-pay | Admitting: Internal Medicine

## 2011-12-04 VITALS — BP 118/70 | HR 80 | Temp 98.3°F | Resp 16 | Wt 156.0 lb

## 2011-12-04 DIAGNOSIS — F329 Major depressive disorder, single episode, unspecified: Secondary | ICD-10-CM

## 2011-12-04 DIAGNOSIS — F988 Other specified behavioral and emotional disorders with onset usually occurring in childhood and adolescence: Secondary | ICD-10-CM

## 2011-12-04 DIAGNOSIS — F411 Generalized anxiety disorder: Secondary | ICD-10-CM

## 2011-12-04 DIAGNOSIS — J019 Acute sinusitis, unspecified: Secondary | ICD-10-CM

## 2011-12-04 DIAGNOSIS — F419 Anxiety disorder, unspecified: Secondary | ICD-10-CM

## 2011-12-04 DIAGNOSIS — F3289 Other specified depressive episodes: Secondary | ICD-10-CM

## 2011-12-04 MED ORDER — LEVOFLOXACIN 250 MG PO TABS: 250.0000 mg | ORAL_TABLET | Freq: Every day | ORAL | Status: AC

## 2011-12-04 NOTE — Patient Instructions (Addendum)
Take all new medications as prescribed Continue all other medications as before  

## 2011-12-05 ENCOUNTER — Encounter: Payer: Self-pay | Admitting: Internal Medicine

## 2011-12-05 NOTE — Assessment & Plan Note (Signed)
Mild to mod, for antibx course,  to f/u any worsening symptoms or concerns 

## 2011-12-05 NOTE — Progress Notes (Signed)
Subjective:    Patient ID: Robin Farley, female    DOB: [birth year], 46 y.o.   MRN: [id number]  HPI   Here with 3 days acute onset fever, facial pain, pressure, general weakness and malaise, and greenish d/c, with slight ST, but little to no cough and Pt denies chest pain, increased sob or doe, wheezing, orthopnea, PND, increased LE swelling, palpitations, dizziness or syncope.  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  Pt denies new neurological symptoms such as new headache, or facial or extremity weakness or numbness   Pt denies polydipsia, polyuria. Past Medical History  Diagnosis Date  . HYPERLIPIDEMIA 02/23/2007  . ANEMIA 02/05/2007  . DEPRESSION 02/23/2007  . ADD 03/13/2010  . Acute sinusitis, unspecified 02/26/2007  . Deviated nasal septum 02/05/2007  . ALLERGIC RHINITIS 02/23/2007  . GERD 12/27/2008  . UTI 02/23/2007  . FATIGUE 02/23/2007  . SWELLING MASS OR LUMP IN HEAD AND NECK 07/27/2008  . Abdominal pain, generalized 12/27/2008  . ATTENTION DEFICIT DISORDER, HX OF 02/05/2007  . Depression 02/18/2011  . Anxiety 11/17/2011   Past Surgical History  Procedure Date  . Nasal septum surgery   . Cesarean section     reports that she has never smoked. She does not have any smokeless tobacco history on file. She reports that she drinks alcohol. She reports that she does not use illicit drugs. family history includes Alcohol abuse in her other; Atrial fibrillation in her father and sister; COPD in her father; Depression in her other; Glaucoma in her other; and Hypertension in her mother. Allergies  Allergen Reactions  . Promethazine Hcl    Current Outpatient Prescriptions  on File Prior to Visit  Medication Sig Dispense Refill  . ALPRAZolam (XANAX) 1 MG tablet 1/2 - 1 by mouth up to three times per day as needed  60 tablet  0  . amphetamine-dextroamphetamine (ADDERALL, 20MG ,) 20 MG tablet Take 1 tablet (20 mg total) by mouth daily. To fill Sep 06, 2011  60 tablet  0  . aspirin 81 MG EC tablet Take 81 mg by mouth daily.        Marland Kitchen tretinoin (RETIN-A) 0.025 % cream Apply topically at bedtime.  45 g  0  . amphetamine-dextroamphetamine (ADDERALL XR, 20MG ,) 20 MG 24 hr capsule Take 1 capsule (20 mg total) by mouth 2 (two) times daily.  60 capsule  0   Review of Systems Constitutional: Negative for diaphoresis and unexpected weight change.  HENT: Negative for tinnitus.   Eyes: Negative for photophobia and visual disturbance.  Respiratory: Negative for choking and stridor.   Gastrointestinal: Negative for vomiting and blood in stool.  Genitourinary: Negative for hematuria and decreased urine volume.  Musculoskeletal: Negative for gait problem.  Skin: Negative for color change and wound.  Neurological: Negative for tremors and numbness.     Objective:   Physical Exam BP 118/70  Pulse 80  Temp 98.3 F (36.8 C) (Oral)  Resp 16  Wt 156 lb (70.761 kg)  SpO2 99% Physical Exam  VS noted, mild ill Constitutional: Pt appears well-developed and well-nourished. but mild disheveled HENT: Head: Normocephalic.  Right Ear: External ear normal.  Left Ear: External  ear normal.  Bilat tm's mild erythema.  Sinus tender right > left.  Pharynx mild erythema Eyes: Conjunctivae and EOM are normal. Pupils are equal, round, and reactive to light.  Neck: Normal range of motion. Neck supple.  Cardiovascular: Normal rate and regular rhythm.   Pulmonary/Chest: Effort normal and breath sounds normal.  Neurological: Pt is alert. Not confused Skin: Skin is warm. No erythema. No rash Psychiatric: Pt behavior is normal. Thought content normal. 1+ nervous    Assessment & Plan:

## 2011-12-05 NOTE — Assessment & Plan Note (Signed)
Some improved, Continue all other medications as before, f/u any worsening

## 2011-12-05 NOTE — Assessment & Plan Note (Signed)
stable overall by hx and exam, , and pt to continue medical treatment as before   

## 2011-12-05 NOTE — Assessment & Plan Note (Signed)
stable overall by hx and exam, most recent data reviewed with pt, and pt to continue medical treatment as before Lab Results  Component Value Date   WBC 4.9 02/18/2011   HGB 12.8 02/18/2011   HCT 37.2 02/18/2011   PLT 280.0 02/18/2011   GLUCOSE 86 02/18/2011   CHOL 248* 02/18/2011   TRIG 74.0 02/18/2011   HDL 68.10 02/18/2011   LDLDIRECT 177.8 02/18/2011   ALT 11 02/18/2011   AST 14 02/18/2011   NA 143 02/18/2011   K 4.4 02/18/2011   CL 108 02/18/2011   CREATININE 0.9 02/18/2011   BUN 13 02/18/2011   CO2 29 02/18/2011   TSH 1.70 02/18/2011    

## 2012-01-13 ENCOUNTER — Other Ambulatory Visit: Payer: Self-pay

## 2012-01-13 DIAGNOSIS — F988 Other specified behavioral and emotional disorders with onset usually occurring in childhood and adolescence: Secondary | ICD-10-CM

## 2012-01-13 MED ORDER — AMPHETAMINE-DEXTROAMPHETAMINE 20 MG PO TABS
20.0000 mg | ORAL_TABLET | Freq: Every day | ORAL | Status: DC
Start: 2012-01-13 — End: 2012-02-24

## 2012-01-13 NOTE — Telephone Encounter (Signed)
Called the patient informed prescription is ready for pickup at the front desk. 

## 2012-01-13 NOTE — Telephone Encounter (Signed)
Done hardcopy to robin - 1 mo only as she did not specify 3 mo

## 2012-02-24 ENCOUNTER — Encounter: Payer: Self-pay | Admitting: Internal Medicine

## 2012-02-24 ENCOUNTER — Ambulatory Visit (INDEPENDENT_AMBULATORY_CARE_PROVIDER_SITE_OTHER): Payer: Managed Care, Other (non HMO) | Admitting: Internal Medicine

## 2012-02-24 VITALS — BP 120/80 | HR 86 | Temp 97.5°F | Ht 66.5 in | Wt 157.5 lb

## 2012-02-24 DIAGNOSIS — Z0000 Encounter for general adult medical examination without abnormal findings: Secondary | ICD-10-CM

## 2012-02-24 DIAGNOSIS — F988 Other specified behavioral and emotional disorders with onset usually occurring in childhood and adolescence: Secondary | ICD-10-CM

## 2012-02-24 MED ORDER — AMPHETAMINE-DEXTROAMPHETAMINE 20 MG PO TABS: 20.0000 mg | ORAL_TABLET | Freq: Every day | ORAL | Status: DC

## 2012-02-24 MED ORDER — ALPRAZOLAM 1 MG PO TABS: ORAL_TABLET | ORAL | Status: DC

## 2012-02-24 MED ORDER — AMPHETAMINE-DEXTROAMPHETAMINE 20 MG PO TABS: 20.0000 mg | ORAL_TABLET | Freq: Two times a day (BID) | ORAL | Status: DC

## 2012-02-24 MED ORDER — TRETINOIN 0.025 % EX CREA: TOPICAL_CREAM | Freq: Every day | CUTANEOUS | Status: DC

## 2012-02-24 NOTE — Progress Notes (Signed)
Subjective:    Patient ID: Robin Farley, female    DOB: 11/04/1965, 46 y.o.   MRN: [id number]  HPI  Here for wellness and f/u;  Overall doing ok;  Pt denies CP, worsening SOB, DOE, wheezing, orthopnea, PND, worsening LE edema, palpitations, dizziness or syncope.  Pt denies neurological change such as new Headache, facial or extremity weakness.  Pt denies polydipsia, polyuria, or low sugar symptoms. Pt states overall good compliance with treatment and medications, good tolerability, and trying to follow lower cholesterol diet.  Pt denies worsening depressive symptoms, suicidal ideation or panic. No fever, wt loss, night sweats, loss of appetite, or other constitutional symptoms.  Pt states good ability with ADL's, low fall risk, home safety reviewed and adequate, no significant changes in hearing or vision, and occasionally active with exercise.  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  Doing well on current meds, skin clear, asks for refills today Past Medical History  Diagnosis Date  . HYPERLIPIDEMIA 02/23/2007  . ANEMIA 02/05/2007  . DEPRESSION 02/23/2007  . ADD 03/13/2010  . Acute sinusitis, unspecified 02/26/2007  . Deviated nasal septum 02/05/2007  . ALLERGIC RHINITIS 02/23/2007  . GERD 12/27/2008  . UTI 02/23/2007  . FATIGUE 02/23/2007  . SWELLING MASS OR LUMP IN HEAD AND NECK 07/27/2008  . Abdominal pain, generalized 12/27/2008  . ATTENTION DEFICIT DISORDER, HX OF 02/05/2007  . Depression 02/18/2011  . Anxiety 11/17/2011   Past Surgical History  Procedure Date  . Nasal septum surgery   . Cesarean section     reports that she has never smoked. She does not have any smokeless tobacco history on file. She reports that she drinks alcohol. She reports that she does not use illicit drugs. family history includes Alcohol abuse in her other; Atrial fibrillation in her father and sister; COPD in her father; Depression in her  other; Glaucoma in her other; and Hypertension in her mother. Allergies  Allergen Reactions  . Promethazine Hcl    Current Outpatient Prescriptions on File Prior to Visit  Medication Sig Dispense Refill  . aspirin 81 MG EC tablet Take 81 mg by mouth daily.        . [DISCONTINUED] amphetamine-dextroamphetamine (ADDERALL) 20 MG tablet Take 1 tablet (20 mg total) by mouth daily.  60 tablet  0  . [DISCONTINUED] amphetamine-dextroamphetamine (ADDERALL XR, 20MG ,) 20 MG 24 hr capsule Take 1 capsule (20 mg total) by mouth 2 (two) times daily.  60 capsule  0   Review of Systems Review of Systems  Constitutional: Negative for diaphoresis, activity change, appetite change and unexpected weight change.  HENT: Negative for hearing loss, ear pain, facial swelling, mouth sores and neck stiffness.   Eyes: Negative for pain, redness and visual disturbance.  Respiratory: Negative for shortness of breath and wheezing.   Cardiovascular: Negative for chest pain and palpitations.  Gastrointestinal: Negative for diarrhea, blood in stool, abdominal distention and rectal pain.  Genitourinary: Negative for hematuria, flank pain and decreased urine volume.  Musculoskeletal: Negative for myalgias and joint swelling.  Skin: Negative for color change and wound.  Neurological: Negative for syncope and numbness.  Hematological: Negative for adenopathy.  Psychiatric/Behavioral: Negative for hallucinations, self-injury, decreased concentration and agitation.      Objective:   Physical Exam BP 120/80  Pulse 86  Temp 97.5 F (36.4 C) (Oral)  Ht 5' 6.5" (1.689 m)  Wt 157 lb 8 oz (71.442 kg)  BMI 25.04  kg/m2  SpO2 95% Physical Exam  VS noted Constitutional: Pt is oriented to person, place, and time. Appears well-developed and well-nourished.  HENT:  Head: Normocephalic and atraumatic.  Right Ear: External ear normal.  Left Ear: External ear normal.  Nose: Nose normal.  Mouth/Throat: Oropharynx is clear and  moist.  Eyes: Conjunctivae and EOM are normal. Pupils are equal, round, and reactive to light.  Neck: Normal range of motion. Neck supple. No JVD present. No tracheal deviation present.  Cardiovascular: Normal rate, regular rhythm, normal heart sounds and intact distal pulses.   Pulmonary/Chest: Effort normal and breath sounds normal.  Abdominal: Soft. Bowel sounds are normal. There is no tenderness.  Musculoskeletal: Normal range of motion. Exhibits no edema.  Lymphadenopathy:  Has no cervical adenopathy.  Neurological: Pt is alert and oriented to person, place, and time. Pt has normal reflexes. No cranial nerve deficit.  Skin: Skin is warm and dry. No rash noted.  Psychiatric:  Has  normal mood and affect. Behavior is normal. mild nervous    Assessment & Plan:

## 2012-02-24 NOTE — Assessment & Plan Note (Signed)
stable overall by hx and exam, most recent data reviewed with pt, and pt to continue medical treatment as before . For med refills

## 2012-02-24 NOTE — Assessment & Plan Note (Signed)

## 2012-02-24 NOTE — Patient Instructions (Addendum)
Please remember to followup with your GYN for the yearly pap smear and/or mammogram Please continue your efforts at being more active, low cholesterol diet, and weight control. Your refills were done today Please have the pharmacy call with any other refills you may need. Please remember to sign up for My Chart at your earliest convenience, as this will be important to you in the future with finding out test results. Please return in 1 year for your yearly visit, or sooner if needed, with Lab testing done 3-5 days before

## 2012-05-13 ENCOUNTER — Telehealth: Payer: Self-pay | Admitting: Internal Medicine

## 2012-05-13 DIAGNOSIS — F988 Other specified behavioral and emotional disorders with onset usually occurring in childhood and adolescence: Secondary | ICD-10-CM

## 2012-05-13 MED ORDER — AMPHETAMINE-DEXTROAMPHETAMINE 20 MG PO TABS: 20.0000 mg | ORAL_TABLET | Freq: Two times a day (BID) | ORAL | Status: DC

## 2012-05-13 NOTE — Telephone Encounter (Signed)
Patient informed to pickup hardcopy at the front desk. 

## 2012-05-13 NOTE — Telephone Encounter (Signed)
Did not receive hardcopy 

## 2012-05-13 NOTE — Telephone Encounter (Signed)
Pt needs an RX for adderall generic to take to the pharmacy.

## 2012-05-13 NOTE — Telephone Encounter (Signed)
Done hardcopy to robin  

## 2012-06-22 ENCOUNTER — Encounter: Payer: Self-pay | Admitting: Internal Medicine

## 2012-06-22 ENCOUNTER — Ambulatory Visit (INDEPENDENT_AMBULATORY_CARE_PROVIDER_SITE_OTHER): Payer: Managed Care, Other (non HMO) | Admitting: Internal Medicine

## 2012-06-22 VITALS — BP 122/82 | HR 82 | Temp 97.0°F

## 2012-06-22 DIAGNOSIS — J019 Acute sinusitis, unspecified: Secondary | ICD-10-CM

## 2012-06-22 MED ORDER — LEVOFLOXACIN 500 MG PO TABS: 500.0000 mg | ORAL_TABLET | Freq: Every day | ORAL | Status: DC

## 2012-06-22 NOTE — Progress Notes (Signed)
HPI  Pt presents to the clinic today with c/o sinus pain and pressure, fatigue and headache x 2 weeks. She has been treating herself OTC with Sudafed, Xicam and tylenol without any relief. The sypmtoms are getting worse. She is prone to sinus infection in the past, last one being in 11/2011. She responds well to Levaquin. She has a history of allergies and asthma. She has had sick contacts.  Review of Systems    Past Medical History  Diagnosis Date  . HYPERLIPIDEMIA 02/23/2007  . ANEMIA 02/05/2007  . DEPRESSION 02/23/2007  . ADD 03/13/2010  . Acute sinusitis, unspecified 02/26/2007  . Deviated nasal septum 02/05/2007  . ALLERGIC RHINITIS 02/23/2007  . GERD 12/27/2008  . UTI 02/23/2007  . FATIGUE 02/23/2007  . SWELLING MASS OR LUMP IN HEAD AND NECK 07/27/2008  . Abdominal pain, generalized 12/27/2008  . ATTENTION DEFICIT DISORDER, HX OF 02/05/2007  . Depression 02/18/2011  . Anxiety 11/17/2011    Family History  Problem Relation Age of Onset  . Hypertension Mother   . COPD Father   . Atrial fibrillation Father   . Atrial fibrillation Sister   . Alcohol abuse Other   . Depression Other   . Glaucoma Other     History   Social History  . Marital Status: Married    Spouse Name: N/A    Number of Children: 2  . Years of Education: N/A   Occupational History  . master's in fine arts [employer]    Social History Main Topics  . Smoking status: Never Smoker   . Smokeless tobacco: Not on file  . Alcohol Use: Yes     Comment: rare  . Drug Use: No  . Sexually Active: Not on file   Other Topics Concern  . Not on file   Social History Narrative  . No narrative on file    Allergies  Allergen Reactions  . Promethazine Hcl      Constitutional: Positive headache, fatigue and fever. Denies abrupt weight changes.  HEENT:  Positive eye pain, pressure behind the eyes, facial pain, nasal congestion and sore throat. Denies eye redness, ear pain, ringing in the ears, wax buildup, runny  nose or bloody nose. Respiratory: Positive cough. Denies difficulty breathing or shortness of breath.  Cardiovascular: Denies chest pain, chest tightness, palpitations or swelling in the hands or feet.   No other specific complaints in a complete review of systems (except as listed in HPI above).  Objective:    BP 122/82  Pulse 82  Temp(Src) 97 F (36.1 C) (Oral)  SpO2 97% Wt Readings from Last 3 Encounters:  02/24/12 157 lb 8 oz (71.442 kg)  12/04/11 156 lb (70.761 kg)  11/17/11 154 lb 2 oz (69.911 kg)    General: Appears her stated age, well developed, well nourished in NAD. HEENT: Head: normal shape and size; Eyes: sclera white, no icterus, conjunctiva pink, PERRLA and EOMs intact; Ears: Tm's gray and intact, normal light reflex; Nose: mucosa pink and moist, septum midline; Throat/Mouth: + PND. Teeth present, mucosa pink and moist, no exudate noted, no lesions or ulcerations noted.  Neck: Mild cervical lymphadenopathy. Neck supple, trachea midline. No massses, lumps or thyromegaly present.  Cardiovascular: Normal rate and rhythm. S1,S2 noted.  No murmur, rubs or gallops noted. No JVD or BLE edema. No carotid bruits noted. Pulmonary/Chest: Normal effort and positive vesicular breath sounds. No respiratory distress. No wheezes, rales or ronchi noted.      Assessment & Plan:   Acute  bacterial sinusitis  Can use a Neti Pot which can be purchased from your local drug store. Flonase 2 sprays each nostril for 3 days and then as needed. Levaquin x 7 days  RTC as needed or if symptoms persist.

## 2012-06-22 NOTE — Patient Instructions (Signed)

## 2012-09-20 ENCOUNTER — Encounter (INDEPENDENT_AMBULATORY_CARE_PROVIDER_SITE_OTHER): Payer: Self-pay

## 2012-09-23 ENCOUNTER — Telehealth: Payer: Self-pay | Admitting: Internal Medicine

## 2012-09-23 DIAGNOSIS — F988 Other specified behavioral and emotional disorders with onset usually occurring in childhood and adolescence: Secondary | ICD-10-CM

## 2012-09-23 MED ORDER — AMPHETAMINE-DEXTROAMPHETAMINE 20 MG PO TABS: 20.0000 mg | ORAL_TABLET | Freq: Two times a day (BID) | ORAL | Status: DC

## 2012-09-23 NOTE — Telephone Encounter (Signed)
Done hardcopy to robin  

## 2012-09-23 NOTE — Telephone Encounter (Signed)
Called the patient informed to pickup hardcopy at the front desk. 

## 2012-09-23 NOTE — Telephone Encounter (Signed)
Pt req refill for adderall. Please advise.

## 2012-10-06 ENCOUNTER — Encounter (INDEPENDENT_AMBULATORY_CARE_PROVIDER_SITE_OTHER): Payer: Self-pay | Admitting: General Surgery

## 2012-10-06 ENCOUNTER — Telehealth (INDEPENDENT_AMBULATORY_CARE_PROVIDER_SITE_OTHER): Payer: Self-pay | Admitting: General Surgery

## 2012-10-06 ENCOUNTER — Ambulatory Visit (INDEPENDENT_AMBULATORY_CARE_PROVIDER_SITE_OTHER): Payer: Managed Care, Other (non HMO) | Admitting: General Surgery

## 2012-10-06 VITALS — BP 122/72 | HR 68 | Temp 99.2°F | Resp 15 | Ht 66.0 in | Wt 158.4 lb

## 2012-10-06 DIAGNOSIS — C4370 Malignant melanoma of unspecified lower limb, including hip: Secondary | ICD-10-CM

## 2012-10-06 DIAGNOSIS — C4372 Malignant melanoma of left lower limb, including hip: Secondary | ICD-10-CM

## 2012-10-06 NOTE — Telephone Encounter (Signed)
Lymphoscintigram set up for 10/13/2012 to Arrive at 9:45 am at [facility]. No Prep. Patient aware.

## 2012-10-06 NOTE — Progress Notes (Signed)
Subjective:   new diagnosis melanoma left lower extremity  Patient ID: Robin Farley, female   DOB: Aug 02, 1965, 47 y.o.   MRN: [id number]  HPI Patient is a very pleasant 47 year old female referred by Dr. 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 The patient has had previous melanotic lesions excised but these were all premalignant. She recently presented to Dr. Terri Piedra for examination. A suspicious lesion was found on the lateral left thigh. A shave biopsy of a portion of the lesion was performed. This has revealed a superficial spreading melanoma. The lesion extends to the deep specimen edge and it is felt to be at least a level III and at least 0.33 mm in thickness. Focal regression was noted. No vascular invasion. She has no previous history of malignant melanoma. She had not noticed this lesion herself.  Past Medical History  Diagnosis Date  . HYPERLIPIDEMIA 02/23/2007  . ANEMIA 02/05/2007  . DEPRESSION 02/23/2007  . ADD 03/13/2010  . Acute sinusitis, unspecified 02/26/2007  . Deviated nasal septum 02/05/2007  . ALLERGIC RHINITIS 02/23/2007  . GERD 12/27/2008  . UTI 02/23/2007  . FATIGUE 02/23/2007  . SWELLING MASS OR LUMP IN HEAD AND NECK 07/27/2008  . Abdominal pain, generalized 12/27/2008  . ATTENTION DEFICIT DISORDER, HX OF 02/05/2007  . Depression 02/18/2011  . Anxiety 11/17/2011   Past Surgical History  Procedure Laterality Date  . Nasal septum surgery    . Cesarean section     Current Outpatient Prescriptions  Medication Sig Dispense Refill  . amphetamine-dextroamphetamine (ADDERALL) 20 MG tablet Take 1 tablet (20 mg total) by mouth 2 (two) times daily.  60 tablet  0  . aspirin 81 MG EC tablet Take 81 mg by mouth daily.         No current facility-administered medications for this visit.   Allergies  Allergen Reactions  . Promethazine Hcl    History  Substance Use Topics  . Smoking status: Never Smoker   . Smokeless tobacco: Not on  file  . Alcohol Use: Yes     Comment: rare     Review of Systems  Constitutional: Negative.   Respiratory: Negative.   Cardiovascular: Negative.   Gastrointestinal: Negative.   Musculoskeletal: Negative.        Objective:   Physical Exam BP 122/72  Pulse 68  Temp(Src) 99.2 F (37.3 C) (Temporal)  Resp 15  Ht 5\' 6"  (1.676 m)  Wt 158 lb 6.4 oz (71.85 kg)  BMI 25.58 kg/m2 General: Alert, well-developed fair skinned Caucasian female, in no distress Skin: Warm and dry .  Fair skinned and red hair. On the lateral left mid thigh is a 20 x 10 mm flat raised slightly pigmented erythematous lesion without ulceration. No evidence of satellite or in-transit metastases. Small healing biopsy site on the portion of the lesion. HEENT: No palpable masses or thyromegaly. Sclera nonicteric. Pupils equal round and reactive. Oropharynx clear. Lymph nodes: No cervical, supraclavicular, or inguinal nodes palpable. Lungs: Breath sounds clear and equal without increased work of breathing Cardiovascular: Regular rate and rhythm without murmur. No JVD or edema. Peripheral pulses intact. Abdomen: Nondistended. Soft and nontender. No masses palpable. No organomegaly. No palpable hernias. Extremities: No edema or joint swelling or deformity. No chronic venous stasis changes. Neurologic: Alert and fully oriented. Gait normal.    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 At least 0.3 mm in depth. I discussed the diagnosis in detail with the  patient and her husband. The exact stage or depth of her tumor is unknown at this point. This will obviously need excision with negative margins. The lesion is at least deeper than what we have the current pathology and therefore I would recommend a sentinel lymph node biopsy in conjunction with wide excision. I'm going to obtain preoperative mapping. We discussed the nature and indications for the surgery as well as risks of anesthetic complications  bleeding, infection wound healing problems. They understand that the sentinel lymph node biopsy is a prognostic procedure. We discussed that lymph node dissection could be indicated if the lymph node is positive. All their questions were answered. We'll go ahead with the preoperative mapping and schedule her for surgery.    Plan:     Preoperative lymphatic mapping followed by excision and sentinel lymph node biopsy for malignant melanoma left lower extremity.

## 2012-10-06 NOTE — Patient Instructions (Addendum)
Melanoma Melanoma is the least common, but most dangerous, form of skin cancer. This is because it can spread (metastasize) to other organs and can be life-threatening. Melanoma is a cancerous (malignant) tumor that begins in a certain type of cells, called melanocytes. Melanocytes are the cells that produce the color (pigment) called melanin. Melanin colors our skin, hair, eyes, and moles. CAUSES  The exact cause of melanoma is unknown. You may have a higher risk if you:  Spend or have spent a lot of time in the sun. This includes sunlamp and tanning booth exposure.  Have had sunburns. This put you at a particularly increased risk for melanoma. The more blistering sunburns a person has, the higher the risk.  Spend time in parts of the world with more intense sunlight.  Have fair skin that does not tan easily. You may have a lower risk if you have a darker skin color. However, people with darker skin can get melanoma, especially on the hands and feet (acral areas).  Have a close relative (parent, sibling) who has melanoma.  Have a large number of skin moles (more than 100). SYMPTOMS  A skin mole is suspicious if it has any of these 5 traits. This is called the ABCDE's of melanoma:  Asymmetry: Irregular shape, not simply round or oval.  Border: Edge of the mole is irregular, not smooth.  Color: Mole may have multiple colors in it, including brown, black, blue, red, or tan.  Diameter: More than 0.2 inches (6 mm) across.  Evolving: Any unusual change or symptoms in the mole, such as pain, itching, stinging, sensitivity, or bleeding. A mole that is noticeably changing in appearance, or any new mole, should be checked for melanoma. In general, people develop new moles until age 30. New moles after this age should be brought to the attention of your caregiver. DIAGNOSIS  Your caregiver can look at your skin and find lesions or moles that may be suspicious. A patient may also notice a mole  with symptoms or a mole that does not look like most of the other moles on his or her body. This is called the "ugly duckling" sign. A tissue sample (biopsy) examined under a microscope is needed to determine if it is melanoma. The size and extent of the biopsy will depend on the location, size, and appearance of the skin lesion or mole. The biopsy can also reveal whether melanoma has spread to deeper layers of the skin. TREATMENT  Surgery to completely remove the melanoma is required. Lymph nodes may also be removed. If the melanoma has spread to other organs, such as the liver, lungs, bone, or brain, cancer-fighting drugs (chemotherapy) must be used. Your caregiver will discuss your treatment options with you. You can ask about being included in a clinical trial to evaluate new forms of treatment. Melanoma can occasionally recur years after the initial diagnosis. If you have melanoma, you will need follow-up visits with your caregiver for many years. PREVENTION  Risk for melanoma can be reduced by minimizing sun exposure. Practice the 3 S's:  Slip on a shirt.  Slop on sunscreen.  Slap on a hat. Do not spend time in the sun during peak midafternoon hours. Sunscreen/sunblock with SPF 30 or higher and UVA/UVB block should be applied regularly. You should do this even during brief exposure to sunlight. You should also do this on cloudy days and in winter, even though the perceived sunlight is less. Always avoid sunburn! Wear sunglasses that block UV   light. Be sure to see your caregiver if you have any new or changing moles. HOME CARE INSTRUCTIONS   Follow wound care instructions after surgical removal of your melanoma.  Practice good sun avoidance and protective measures as described above.  Let your close family members (parents, children, siblings) know about your diagnosis. This puts them at a higher risk of getting melanoma than the general population. SEEK MEDICAL CARE IF:   You notice any  new moles, or you have any moles that are changing.  You have had a melanoma removed and you notice a new growth near the same location.  You have had a melanoma removed and you experience any new or unexplained health problems. Document Released: 03/30/2005 Document Revised: 06/22/2011 Document Reviewed: 07/19/2009 [facility] Patient Information 2014 [place], [place].  Our office will contact you to schedule your surgery. Please call us back if he did not hear anything in 2-3 days.

## 2012-10-12 ENCOUNTER — Other Ambulatory Visit (INDEPENDENT_AMBULATORY_CARE_PROVIDER_SITE_OTHER): Payer: Self-pay | Admitting: *Deleted

## 2012-10-12 ENCOUNTER — Telehealth (INDEPENDENT_AMBULATORY_CARE_PROVIDER_SITE_OTHER): Payer: Self-pay | Admitting: General Surgery

## 2012-10-12 DIAGNOSIS — C439 Malignant melanoma of skin, unspecified: Secondary | ICD-10-CM

## 2012-10-12 NOTE — Telephone Encounter (Signed)
Patient is aware of  Test  For NM lymph/ Gland mapping  At [facility] [place] 10am

## 2012-10-13 ENCOUNTER — Encounter (HOSPITAL_COMMUNITY)
Admission: RE | Admit: 2012-10-13 | Discharge: 2012-10-13 | Disposition: A | Discharge status: Home / Self Care | Payer: Managed Care, Other (non HMO) | Source: Ambulatory Visit | Attending: General Surgery | Admitting: General Surgery

## 2012-10-13 ENCOUNTER — Encounter (HOSPITAL_COMMUNITY): Admission: RE | Admit: 2012-10-13 | Payer: Managed Care, Other (non HMO) | Source: Ambulatory Visit

## 2012-10-13 DIAGNOSIS — C439 Malignant melanoma of skin, unspecified: Secondary | ICD-10-CM | POA: Insufficient documentation

## 2012-10-13 MED ORDER — TECHNETIUM TC 99M SULFUR COLLOID FILTERED
0.5000 | Freq: Once | INTRAVENOUS | Status: AC | PRN
  Administered 2012-10-13: 0.5 via INTRADERMAL

## 2012-10-18 ENCOUNTER — Encounter: Payer: Self-pay | Admitting: Internal Medicine

## 2012-10-18 ENCOUNTER — Ambulatory Visit (INDEPENDENT_AMBULATORY_CARE_PROVIDER_SITE_OTHER): Payer: Managed Care, Other (non HMO) | Admitting: Internal Medicine

## 2012-10-18 VITALS — BP 108/70 | HR 80 | Temp 97.1°F | Ht 66.0 in | Wt 162.0 lb

## 2012-10-18 DIAGNOSIS — J309 Allergic rhinitis, unspecified: Secondary | ICD-10-CM

## 2012-10-18 DIAGNOSIS — F988 Other specified behavioral and emotional disorders with onset usually occurring in childhood and adolescence: Secondary | ICD-10-CM

## 2012-10-18 DIAGNOSIS — J029 Acute pharyngitis, unspecified: Secondary | ICD-10-CM

## 2012-10-18 MED ORDER — LEVOFLOXACIN 250 MG PO TABS: 250.0000 mg | ORAL_TABLET | Freq: Every day | ORAL | Status: DC

## 2012-10-18 NOTE — Progress Notes (Signed)
Subjective:    Patient ID: Robin Farley, female    DOB: 10/14/1965, 47 y.o.   MRN: [id number]  HPI   Here with 2-3 days acute onset fever, severe ST, pressure, headache, general weakness and malaise, but pt denies chest pain, wheezing, increased sob or doe, orthopnea, PND, increased LE swelling, palpitations, dizziness or syncope.  Denies worsening depressive symptoms, suicidal ideation, or panic; has ongoing anxiety.  ADD med still working well, cont's employed fully and socially ok.  S/p recent melanoma removal thigh.  No other new skin lesions.  Does have several wks ongoing nasal allergy symptoms with clearish congestion, itch and sneezing, without fever, pain, ST, cough, swelling or wheezing, but overall very mild , controlled with med Past Medical History  Diagnosis Date  . HYPERLIPIDEMIA 02/23/2007  . ANEMIA 02/05/2007  . DEPRESSION 02/23/2007  . ADD 03/13/2010  . Acute sinusitis, unspecified 02/26/2007  . Deviated nasal septum 02/05/2007  . ALLERGIC RHINITIS 02/23/2007  . GERD 12/27/2008  . UTI 02/23/2007  . FATIGUE 02/23/2007  . SWELLING MASS OR LUMP IN HEAD AND NECK 07/27/2008  . Abdominal pain, generalized 12/27/2008  . ATTENTION DEFICIT DISORDER, HX OF 02/05/2007  . Depression 02/18/2011  . Anxiety 11/17/2011   Past Surgical History  Procedure Laterality Date  . Nasal septum surgery    . Cesarean section      reports that she has never smoked. She does not have any smokeless tobacco history on file. She reports that  drinks alcohol. She reports that she does not use illicit drugs. family history includes Alcohol abuse in her other; Atrial fibrillation in her father and sister; COPD in her father; Depression in her other; Glaucoma in her other; and Hypertension in her mother. Allergies  Allergen Reactions  . Promethazine Hcl     Legs twitch   Current Outpatient Prescriptions on File Prior to Visit  Medication Sig Dispense Refill  . amphetamine-dextroamphetamine (ADDERALL) 20  MG tablet Take 1 tablet (20 mg total) by mouth 2 (two) times daily.  60 tablet  0  . aspirin 81 MG EC tablet Take 81 mg by mouth daily.         No current facility-administered medications on file prior to visit.   Review of Systems  Constitutional: Negative for unexpected weight change, or unusual diaphoresis  HENT: Negative for tinnitus.   Eyes: Negative for photophobia and visual disturbance.  Respiratory: Negative for choking and stridor.   Gastrointestinal: Negative for vomiting and blood in stool.  Genitourinary: Negative for hematuria and decreased urine volume.  Musculoskeletal: Negative for acute joint swelling Skin: Negative for color change and wound.  Neurological: Negative for tremors and numbness other than noted  Psychiatric/Behavioral: Negative for decreased concentration or  hyperactivity.  '    Objective:   Physical Exam BP 108/70  Pulse 80  Temp(Src) 97.1 F (36.2 C) (Oral)  Ht 5\' 6"  (1.676 m)  Wt 162 lb (73.483 kg)  BMI 26.16 kg/m2  SpO2 97%  LMP 09/14/2012 VS noted, mild ill Constitutional: Pt appears well-developed and well-nourished.  HENT: Head: NCAT.  Right Ear: External ear normal.  Left Ear: External ear normal.  Bilat tm's with mild erythema.  Max sinus areas non tender.  Pharynx with severe erythema, + exudate Eyes: Conjunctivae and EOM are normal. Pupils are equal, round, and reactive to light.  Neck: Normal range of motion. Neck supple.  Cardiovascular: Normal rate and regular rhythm.   Pulmonary/Chest: Effort normal and breath sounds normal.  Neurological: Pt is  alert. Not confused  Skin: Skin is warm. No erythema.  Psychiatric: Pt behavior is normal. Thought content normal. mild nervous, not depressed affect    Assessment & Plan:

## 2012-10-18 NOTE — Patient Instructions (Signed)
Please take all new medication as prescribed Please continue all other medications as before, and refills have been done if requested.  Please remember to sign up for My Chart if you have not done so, as this will be important to you in the future with finding out test results, communicating by private email, and scheduling acute appointments online when needed.   

## 2012-10-19 ENCOUNTER — Encounter (HOSPITAL_BASED_OUTPATIENT_CLINIC_OR_DEPARTMENT_OTHER): Payer: Self-pay | Admitting: *Deleted

## 2012-10-19 NOTE — Progress Notes (Signed)
No labs needed

## 2012-10-19 NOTE — Assessment & Plan Note (Signed)
Mild to mod, for antibx course,  to f/u any worsening symptoms or concerns 

## 2012-10-19 NOTE — Assessment & Plan Note (Addendum)
stable overall by history and exam, and pt to continue medical treatment as before - for otc allegra prn,  to f/u any worsening symptoms or concerns

## 2012-10-19 NOTE — Assessment & Plan Note (Signed)
stable overall by history and exam, and pt to continue medical treatment as before,  to f/u any worsening symptoms or concerns 

## 2012-10-24 ENCOUNTER — Ambulatory Visit (HOSPITAL_BASED_OUTPATIENT_CLINIC_OR_DEPARTMENT_OTHER): Payer: Managed Care, Other (non HMO) | Admitting: Anesthesiology

## 2012-10-24 ENCOUNTER — Ambulatory Visit (HOSPITAL_COMMUNITY)
Admission: RE | Admit: 2012-10-24 | Discharge: 2012-10-24 | Disposition: A | Discharge status: Home / Self Care | Payer: Managed Care, Other (non HMO) | Source: Ambulatory Visit | Attending: General Surgery | Admitting: General Surgery

## 2012-10-24 ENCOUNTER — Ambulatory Visit (HOSPITAL_BASED_OUTPATIENT_CLINIC_OR_DEPARTMENT_OTHER)
Admission: RE | Admit: 2012-10-24 | Discharge: 2012-10-24 | Disposition: A | Discharge status: Home / Self Care | Payer: Managed Care, Other (non HMO) | Source: Ambulatory Visit | Attending: General Surgery | Admitting: General Surgery

## 2012-10-24 ENCOUNTER — Encounter (HOSPITAL_BASED_OUTPATIENT_CLINIC_OR_DEPARTMENT_OTHER): Payer: Self-pay | Admitting: *Deleted

## 2012-10-24 ENCOUNTER — Encounter (HOSPITAL_BASED_OUTPATIENT_CLINIC_OR_DEPARTMENT_OTHER)
Admission: RE | Disposition: A | Discharge status: Home / Self Care | Payer: Self-pay | Source: Ambulatory Visit | Attending: General Surgery

## 2012-10-24 ENCOUNTER — Encounter (HOSPITAL_BASED_OUTPATIENT_CLINIC_OR_DEPARTMENT_OTHER): Payer: Self-pay | Admitting: Anesthesiology

## 2012-10-24 DIAGNOSIS — C4372 Malignant melanoma of left lower limb, including hip: Secondary | ICD-10-CM

## 2012-10-24 DIAGNOSIS — F3289 Other specified depressive episodes: Secondary | ICD-10-CM | POA: Insufficient documentation

## 2012-10-24 DIAGNOSIS — F329 Major depressive disorder, single episode, unspecified: Secondary | ICD-10-CM | POA: Insufficient documentation

## 2012-10-24 DIAGNOSIS — F988 Other specified behavioral and emotional disorders with onset usually occurring in childhood and adolescence: Secondary | ICD-10-CM | POA: Insufficient documentation

## 2012-10-24 DIAGNOSIS — C4370 Malignant melanoma of unspecified lower limb, including hip: Secondary | ICD-10-CM | POA: Insufficient documentation

## 2012-10-24 DIAGNOSIS — K219 Gastro-esophageal reflux disease without esophagitis: Secondary | ICD-10-CM | POA: Insufficient documentation

## 2012-10-24 DIAGNOSIS — F411 Generalized anxiety disorder: Secondary | ICD-10-CM | POA: Insufficient documentation

## 2012-10-24 DIAGNOSIS — E785 Hyperlipidemia, unspecified: Secondary | ICD-10-CM | POA: Insufficient documentation

## 2012-10-24 DIAGNOSIS — C7650 Malignant neoplasm of unspecified lower limb: Secondary | ICD-10-CM

## 2012-10-24 HISTORY — PX: MELANOMA EXCISION WITH SENTINEL LYMPH NODE BIOPSY: SHX5267

## 2012-10-24 LAB — POCT HEMOGLOBIN-HEMACUE: Hemoglobin: 14.2 g/dL (ref 12.0–15.0)

## 2012-10-24 SURGERY — MELANOMA EXCISION WITH SENTINEL LYMPH NODE BIOPSY
Anesthesia: General | Site: Leg Upper | Laterality: Left | Wound class: Clean

## 2012-10-24 MED ORDER — LIDOCAINE HCL (CARDIAC) 20 MG/ML IV SOLN
INTRAVENOUS | Status: DC | PRN
  Administered 2012-10-24: 75 mg via INTRAVENOUS

## 2012-10-24 MED ORDER — CEFAZOLIN SODIUM-DEXTROSE 2-3 GM-% IV SOLR
2.0000 g | INTRAVENOUS | Status: AC
  Administered 2012-10-24: 2 g via INTRAVENOUS

## 2012-10-24 MED ORDER — DEXAMETHASONE SODIUM PHOSPHATE 4 MG/ML IJ SOLN
INTRAMUSCULAR | Status: DC | PRN
  Administered 2012-10-24: 10 mg via INTRAVENOUS

## 2012-10-24 MED ORDER — SODIUM CHLORIDE 0.9 % IJ SOLN
INTRAMUSCULAR | Status: DC | PRN
  Administered 2012-10-24: 14:00:00

## 2012-10-24 MED ORDER — OXYCODONE HCL 5 MG/5ML PO SOLN: 5.0000 mg | Freq: Once | ORAL | Status: DC | PRN

## 2012-10-24 MED ORDER — MIDAZOLAM HCL 5 MG/5ML IJ SOLN
INTRAMUSCULAR | Status: DC | PRN
  Administered 2012-10-24: 2 mg via INTRAVENOUS

## 2012-10-24 MED ORDER — TECHNETIUM TC 99M SULFUR COLLOID FILTERED
500.0000 | Freq: Once | INTRAVENOUS | Status: AC | PRN
  Administered 2012-10-24: 500 via INTRADERMAL

## 2012-10-24 MED ORDER — ONDANSETRON HCL 4 MG/2ML IJ SOLN: 4.0000 mg | Freq: Four times a day (QID) | INTRAMUSCULAR | Status: DC | PRN

## 2012-10-24 MED ORDER — ONDANSETRON HCL 4 MG/2ML IJ SOLN: 4.0000 mg | Freq: Once | INTRAMUSCULAR | Status: DC | PRN

## 2012-10-24 MED ORDER — HYDROMORPHONE HCL PF 1 MG/ML IJ SOLN
0.2500 mg | INTRAMUSCULAR | Status: DC | PRN
  Administered 2012-10-24: 0.5 mg via INTRAVENOUS

## 2012-10-24 MED ORDER — FENTANYL CITRATE 0.05 MG/ML IJ SOLN
INTRAMUSCULAR | Status: DC | PRN
  Administered 2012-10-24: 25 ug via INTRAVENOUS
  Administered 2012-10-24: 50 ug via INTRAVENOUS
  Administered 2012-10-24: 25 ug via INTRAVENOUS

## 2012-10-24 MED ORDER — PROPOFOL 10 MG/ML IV BOLUS
INTRAVENOUS | Status: DC | PRN
  Administered 2012-10-24: 10 mg via INTRAVENOUS
  Administered 2012-10-24: 170 mg via INTRAVENOUS
  Administered 2012-10-24: 20 mg via INTRAVENOUS

## 2012-10-24 MED ORDER — ONDANSETRON HCL 4 MG/2ML IJ SOLN
INTRAMUSCULAR | Status: DC | PRN
  Administered 2012-10-24: 4 mg via INTRAVENOUS

## 2012-10-24 MED ORDER — HYDROMORPHONE HCL PF 1 MG/ML IJ SOLN: 0.2500 mg | INTRAMUSCULAR | Status: DC | PRN

## 2012-10-24 MED ORDER — OXYCODONE HCL 5 MG PO TABS: 5.0000 mg | ORAL_TABLET | Freq: Once | ORAL | Status: DC | PRN

## 2012-10-24 MED ORDER — FENTANYL CITRATE 0.05 MG/ML IJ SOLN
50.0000 ug | INTRAMUSCULAR | Status: DC | PRN
  Administered 2012-10-24: 50 ug via INTRAVENOUS

## 2012-10-24 MED ORDER — MIDAZOLAM HCL 2 MG/2ML IJ SOLN
1.0000 mg | INTRAMUSCULAR | Status: DC | PRN
  Administered 2012-10-24: 2 mg via INTRAVENOUS

## 2012-10-24 MED ORDER — LACTATED RINGERS IV SOLN
INTRAVENOUS | Status: DC
  Administered 2012-10-24 (×4): via INTRAVENOUS

## 2012-10-24 MED ORDER — KETOROLAC TROMETHAMINE 30 MG/ML IJ SOLN: 15.0000 mg | Freq: Once | INTRAMUSCULAR | Status: DC | PRN

## 2012-10-24 MED ORDER — CHLORHEXIDINE GLUCONATE 4 % EX LIQD: 1.0000 "application " | Freq: Once | CUTANEOUS | Status: DC

## 2012-10-24 MED ORDER — BUPIVACAINE HCL (PF) 0.25 % IJ SOLN
INTRAMUSCULAR | Status: DC | PRN
  Administered 2012-10-24: 10 mL
  Administered 2012-10-24: 20 mL

## 2012-10-24 MED ORDER — OXYCODONE-ACETAMINOPHEN 5-325 MG PO TABS: 1.0000 | ORAL_TABLET | ORAL | Status: DC | PRN

## 2012-10-24 SURGICAL SUPPLY — 63 items
APPLIER CLIP 11 MED OPEN (CLIP) ×2
BANDAGE ELASTIC 4 VELCRO ST LF (GAUZE/BANDAGES/DRESSINGS) ×2 IMPLANT
BANDAGE ELASTIC 6 VELCRO ST LF (GAUZE/BANDAGES/DRESSINGS) ×2 IMPLANT
BANDAGE GAUZE ELAST BULKY 4 IN (GAUZE/BANDAGES/DRESSINGS) ×2 IMPLANT
BENZOIN TINCTURE PRP APPL 2/3 (GAUZE/BANDAGES/DRESSINGS) IMPLANT
BLADE SURG 10 STRL SS (BLADE) IMPLANT
BLADE SURG 15 STRL LF DISP TIS (BLADE) ×2 IMPLANT
BLADE SURG 15 STRL SS (BLADE) ×2
CANISTER SUCTION 1200CC (MISCELLANEOUS) IMPLANT
CHLORAPREP W/TINT 26ML (MISCELLANEOUS) ×2 IMPLANT
CLIP APPLIE 11 MED OPEN (CLIP) ×1 IMPLANT
CLIP TI WIDE RED SMALL 6 (CLIP) IMPLANT
CLOTH BEACON ORANGE TIMEOUT ST (SAFETY) ×2 IMPLANT
COVER MAYO STAND STRL (DRAPES) ×2 IMPLANT
COVER PROBE W GEL 5X96 (DRAPES) ×2 IMPLANT
COVER TABLE BACK 60X90 (DRAPES) ×2 IMPLANT
DECANTER SPIKE VIAL GLASS SM (MISCELLANEOUS) IMPLANT
DERMABOND ADVANCED (GAUZE/BANDAGES/DRESSINGS) ×1
DERMABOND ADVANCED .7 DNX12 (GAUZE/BANDAGES/DRESSINGS) ×1 IMPLANT
DRAIN CHANNEL 19F RND (DRAIN) IMPLANT
DRAIN HEMOVAC 1/8 X 5 (WOUND CARE) IMPLANT
DRAPE LAPAROSCOPIC ABDOMINAL (DRAPES) IMPLANT
DRAPE PED LAPAROTOMY (DRAPES) ×2 IMPLANT
DRAPE UTILITY XL STRL (DRAPES) ×2 IMPLANT
ELECT COATED BLADE 2.86 ST (ELECTRODE) ×2 IMPLANT
ELECT REM PT RETURN 9FT ADLT (ELECTROSURGICAL) ×2
ELECTRODE REM PT RTRN 9FT ADLT (ELECTROSURGICAL) ×1 IMPLANT
EVACUATOR SILICONE 100CC (DRAIN) IMPLANT
GLOVE BIOGEL PI IND STRL 8 (GLOVE) ×1 IMPLANT
GLOVE BIOGEL PI INDICATOR 8 (GLOVE) ×1
GLOVE SS BIOGEL STRL SZ 7.5 (GLOVE) ×1 IMPLANT
GLOVE SUPERSENSE BIOGEL SZ 7.5 (GLOVE) ×1
GLOVE SURG SS PI 7.0 STRL IVOR (GLOVE) ×2 IMPLANT
GOWN PREVENTION PLUS XLARGE (GOWN DISPOSABLE) ×2 IMPLANT
GOWN PREVENTION PLUS XXLARGE (GOWN DISPOSABLE) ×2 IMPLANT
NDL SAFETY ECLIPSE 18X1.5 (NEEDLE) ×1 IMPLANT
NEEDLE HYPO 18GX1.5 SHARP (NEEDLE) ×1
NEEDLE HYPO 25X1 1.5 SAFETY (NEEDLE) ×2 IMPLANT
NEEDLE HYPO 25X5/8 SAFETYGLIDE (NEEDLE) ×2 IMPLANT
NS IRRIG 1000ML POUR BTL (IV SOLUTION) ×2 IMPLANT
PACK BASIN DAY SURGERY FS (CUSTOM PROCEDURE TRAY) ×2 IMPLANT
PAD ALCOHOL SWAB (MISCELLANEOUS) ×2 IMPLANT
PENCIL BUTTON HOLSTER BLD 10FT (ELECTRODE) ×2 IMPLANT
PIN SAFETY STERILE (MISCELLANEOUS) IMPLANT
SLEEVE SCD COMPRESS KNEE MED (MISCELLANEOUS) ×2 IMPLANT
SPONGE LAP 18X18 X RAY DECT (DISPOSABLE) IMPLANT
SPONGE LAP 4X18 X RAY DECT (DISPOSABLE) ×2 IMPLANT
STAPLER VISISTAT 35W (STAPLE) IMPLANT
STRIP CLOSURE SKIN 1/2X4 (GAUZE/BANDAGES/DRESSINGS) IMPLANT
SUT ETHILON 4 0 PS 2 18 (SUTURE) ×4 IMPLANT
SUT MON AB 4-0 PC3 18 (SUTURE) ×2 IMPLANT
SUT MON AB 5-0 PS2 18 (SUTURE) IMPLANT
SUT PROLENE 2 0 CT2 30 (SUTURE) ×2 IMPLANT
SUT SILK 3 0 SH 30 (SUTURE) IMPLANT
SUT VIC AB 3-0 SH 27 (SUTURE)
SUT VIC AB 3-0 SH 27X BRD (SUTURE) IMPLANT
SUT VICRYL 3-0 CR8 SH (SUTURE) ×4 IMPLANT
SYR CONTROL 10ML LL (SYRINGE) ×4 IMPLANT
SYR TB 1ML LL NO SAFETY (SYRINGE) ×2 IMPLANT
TOWEL OR 17X24 6PK STRL BLUE (TOWEL DISPOSABLE) ×4 IMPLANT
TOWEL OR NON WOVEN STRL DISP B (DISPOSABLE) ×2 IMPLANT
TUBE CONNECTING 20X1/4 (TUBING) IMPLANT
YANKAUER SUCT BULB TIP NO VENT (SUCTIONS) IMPLANT

## 2012-10-24 NOTE — H&P (View-Only) (Signed)
Subjective:   new diagnosis melanoma left lower extremity  Patient ID: Robin Farley, female   DOB: 01/08/1966, 47 y.o.   MRN: [id number]  HPI Patient is a very pleasant 47-year-old female referred by Dr. 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 The patient has had previous melanotic lesions excised but these were all premalignant. She recently presented to Dr. Lupton for examination. A suspicious lesion was found on the lateral left thigh. A shave biopsy of a portion of the lesion was performed. This has revealed a superficial spreading melanoma. The lesion extends to the deep specimen edge and it is felt to be at least a level III and at least 0.33 mm in thickness. Focal regression was noted. No vascular invasion. She has no previous history of malignant melanoma. She had not noticed this lesion herself.  Past Medical History  Diagnosis Date  . HYPERLIPIDEMIA 02/23/2007  . ANEMIA 02/05/2007  . DEPRESSION 02/23/2007  . ADD 03/13/2010  . Acute sinusitis, unspecified 02/26/2007  . Deviated nasal septum 02/05/2007  . ALLERGIC RHINITIS 02/23/2007  . GERD 12/27/2008  . UTI 02/23/2007  . FATIGUE 02/23/2007  . SWELLING MASS OR LUMP IN HEAD AND NECK 07/27/2008  . Abdominal pain, generalized 12/27/2008  . ATTENTION DEFICIT DISORDER, HX OF 02/05/2007  . Depression 02/18/2011  . Anxiety 11/17/2011   Past Surgical History  Procedure Laterality Date  . Nasal septum surgery    . Cesarean section     Current Outpatient Prescriptions  Medication Sig Dispense Refill  . amphetamine-dextroamphetamine (ADDERALL) 20 MG tablet Take 1 tablet (20 mg total) by mouth 2 (two) times daily.  60 tablet  0  . aspirin 81 MG EC tablet Take 81 mg by mouth daily.         No current facility-administered medications for this visit.   Allergies  Allergen Reactions  . Promethazine Hcl    History  Substance Use Topics  . Smoking status: Never Smoker   . Smokeless tobacco: Not on  file  . Alcohol Use: Yes     Comment: rare     Review of Systems  Constitutional: Negative.   Respiratory: Negative.   Cardiovascular: Negative.   Gastrointestinal: Negative.   Musculoskeletal: Negative.        Objective:   Physical Exam BP 122/72  Pulse 68  Temp(Src) 99.2 F (37.3 C) (Temporal)  Resp 15  Ht 5' 6" (1.676 m)  Wt 158 lb 6.4 oz (71.85 kg)  BMI 25.58 kg/m2 General: Alert, well-developed fair skinned Caucasian female, in no distress Skin: Warm and dry .  Fair skinned and red hair. On the lateral left mid thigh is a 20 x 10 mm flat raised slightly pigmented erythematous lesion without ulceration. No evidence of satellite or in-transit metastases. Small healing biopsy site on the portion of the lesion. HEENT: No palpable masses or thyromegaly. Sclera nonicteric. Pupils equal round and reactive. Oropharynx clear. Lymph nodes: No cervical, supraclavicular, or inguinal nodes palpable. Lungs: Breath sounds clear and equal without increased work of breathing Cardiovascular: Regular rate and rhythm without murmur. No JVD or edema. Peripheral pulses intact. Abdomen: Nondistended. Soft and nontender. No masses palpable. No organomegaly. No palpable hernias. Extremities: No edema or joint swelling or deformity. No chronic venous stasis changes. Neurologic: Alert and fully oriented. Gait normal.    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 At least 0.3 mm in depth. I discussed the diagnosis in detail with the   patient and her husband. The exact stage or depth of her tumor is unknown at this point. This will obviously need excision with negative margins. The lesion is at least deeper than what we have the current pathology and therefore I would recommend a sentinel lymph node biopsy in conjunction with wide excision. I'm going to obtain preoperative mapping. We discussed the nature and indications for the surgery as well as risks of anesthetic complications  bleeding, infection wound healing problems. They understand that the sentinel lymph node biopsy is a prognostic procedure. We discussed that lymph node dissection could be indicated if the lymph node is positive. All their questions were answered. We'll go ahead with the preoperative mapping and schedule her for surgery.    Plan:     Preoperative lymphatic mapping followed by excision and sentinel lymph node biopsy for malignant melanoma left lower extremity.      

## 2012-10-24 NOTE — Anesthesia Postprocedure Evaluation (Signed)
Anesthesia Post Note  Patient: Robin Farley  Procedure(s) Performed: Procedure(s) (LRB): MELANOMA wide EXCISION left lateral thigh WITH SENTINEL LYMPH NODE BIOPSY left groin (Left)  Anesthesia type: General  Patient location: PACU  Post pain: Pain level controlled and Adequate analgesia  Post assessment: Post-op Vital signs reviewed, Patient's Cardiovascular Status Stable, Respiratory Function Stable, Patent Airway and Pain level controlled  Last Vitals:  Filed Vitals:   10/24/12 1600  BP: 133/74  Pulse: 62  Temp:   Resp: 18    Post vital signs: Reviewed and stable  Level of consciousness: awake, alert  and oriented  Complications: No apparent anesthesia complications

## 2012-10-24 NOTE — Transfer of Care (Signed)
Immediate Anesthesia Transfer of Care Note  Patient: Robin Farley  Procedure(s) Performed: Procedure(s): MELANOMA wide EXCISION left lateral thigh WITH SENTINEL LYMPH NODE BIOPSY left groin (Left)  Patient Location: PACU  Anesthesia Type:General  Level of Consciousness: sedated and patient cooperative  Airway & Oxygen Therapy: Patient Spontanous Breathing and Patient connected to face mask oxygen  Post-op Assessment: Report given to PACU RN and Post -op Vital signs reviewed and stable  Post vital signs: Reviewed and stable  Complications: No apparent anesthesia complications

## 2012-10-24 NOTE — Op Note (Signed)
Preoperative Diagnosis: malignant melanoma left lower extremity  Postoprative Diagnosis: same  Procedure: Procedure(s): Blue dye injection left lower extremity, MELANOMA wide EXCISION left lateral thigh WITH SENTINEL LYMPH NODE BIOPSY left groin   Surgeon: Glenna Fellows T   Assistants: none  Anesthesia:  General LMA anesthesia  Indications:  Patient is a 47 year old female who recently had a biopsy of a pigmented lesion on the lateral aspect of her left thigh. This measures about 12 x 5 mm. A small portion was biopsied. This revealed malignant melanoma at least level III, 0.33 mm in thickness but positive deep margin. After extensive discussion regarding indications and risks detailed elsewhere we elected to proceed with wide local excision left groin sentinel lymph node biopsy.    Procedure Detail:  Preoperatively the patient was injected with 1 mCi of technetium sulfur colloid intradermally around the skin lesion in the holding area under sedation. She was then taken to the operating room, placed in the supine position on the operating table, and laryngeal mask general anesthesia induced. She received preoperative IV antibiotics. Under sterile technique after patient timeout I injected 2 cc of dilute methylene blue intradermally at the site of the melanoma and massaged for several minutes. Following this the left lower extremity and left groin and lower abdomen were widely sterilely prepped and draped. The patient timeout was again performed and correct procedure verified. Initially the wide excision was performed. An oblique elliptical excision was marked at least a 1 cm margin in all directions from the edge of the lesion. The skin was sharply incised and dissection carried straight down to the fascia. The specimen was dissected with cautery off the fascia. It was oriented with sutures and sent for permanent pathology. Short skin and subcutaneous flaps were raised superiorly and inferiorly.  The soft tissue was infiltrated with Marcaine without epinephrine. The subcutaneous tissue was closed with interrupted 3-0 Vicryl and the skin was closed with a running 4-0 nylon without undue tension.  Attention was turned to the sentinel lymph node biopsy a definite hot area in the left groin just above the inguinal ligament was identified with the neoprobe. A small oblique incision was made and dissection carried down simultaneous tissue with cautery. Dissection was deepened down onto the external oblique which was incised along the lines of its fibers. The internal oblique was bluntly split. The dissection was deepened down toward the femoral space and using the neoprobe identified a definite hot node which I could feel just medial to the femoral vein. This was able to be grasped with a Babcock and elevated. There was no blue dye. It had high counts. The node was carefully excised on the node and it was removed completely. Ex vivo the node had counts of over 400 with background in the groin at this point of about 20. This was sent as hot non-blue left groin sentinel lymph node. There was no bleeding. The external oblique was closed with running 2-0 Prolene. The deep subcutaneous and Scarpa's was closed with interrupted 3-0 Vicryl. The skin was closed with subcutaneous 4-0 Monocryl and Dermabond. Sponge needle and instrument counts were correct.   Estimated Blood Loss:  Minimal         Drains: none  Blood Given: none          Specimens: #1 wide excision melanoma left thigh   #2 left groin sentinel lymph node        Complications:  * No complications entered in OR log *  Disposition: PACU - hemodynamically stable.         Condition: stable

## 2012-10-24 NOTE — Interval H&P Note (Signed)
History and Physical Interval Note:  10/24/2012 1:45 PM  Robin Farley  has presented today for surgery, with the diagnosis of malognant melanoma left lower extremity  The various methods of treatment have been discussed with the patient and family. After consideration of risks, benefits and other options for treatment, the patient has consented to  Procedure(s): MELANOMA wide EXCISION left lower extremity WITH SENTINEL LYMPH NODE BIOPSY (Left) as a surgical intervention .  The patient's history has been reviewed, patient examined, no change in status, stable for surgery.  I have reviewed the patient's chart and labs.  Questions were answered to the patient's satisfaction.     Etter Royall T

## 2012-10-24 NOTE — Anesthesia Preprocedure Evaluation (Signed)
Anesthesia Evaluation  Patient identified by MRN, date of birth, ID band Patient awake    Reviewed: Allergy & Precautions, H&P , NPO status , Patient's Chart, lab work & pertinent test results  Airway Mallampati: II TM Distance: >3 FB     Dental  (+) Teeth Intact and Dental Advisory Given   Pulmonary  breath sounds clear to auscultation        Cardiovascular Rhythm:Regular Rate:Normal     Neuro/Psych    GI/Hepatic   Endo/Other    Renal/GU      Musculoskeletal   Abdominal   Peds  Hematology   Anesthesia Other Findings   Reproductive/Obstetrics                           Anesthesia Physical Anesthesia Plan  ASA: II  Anesthesia Plan: General   Post-op Pain Management:    Induction: Intravenous  Airway Management Planned: LMA  Additional Equipment:   Intra-op Plan:   Post-operative Plan:   Informed Consent: I have reviewed the patients History and Physical, chart, labs and discussed the procedure including the risks, benefits and alternatives for the proposed anesthesia with the patient or authorized representative who has indicated his/her understanding and acceptance.   Dental advisory given  Plan Discussed with: CRNA and Anesthesiologist  Anesthesia Plan Comments: (Melanoma L. Thigh GERD  Plan GA with LMA  Kipp Brood, MD)        Anesthesia Quick Evaluation

## 2012-10-25 ENCOUNTER — Encounter (HOSPITAL_BASED_OUTPATIENT_CLINIC_OR_DEPARTMENT_OTHER): Payer: Self-pay | Admitting: General Surgery

## 2012-10-28 ENCOUNTER — Telehealth (INDEPENDENT_AMBULATORY_CARE_PROVIDER_SITE_OTHER): Payer: Self-pay | Admitting: General Surgery

## 2012-10-28 NOTE — Telephone Encounter (Signed)
Call the patient and left message as there was no answer

## 2012-11-17 ENCOUNTER — Encounter (INDEPENDENT_AMBULATORY_CARE_PROVIDER_SITE_OTHER): Payer: Managed Care, Other (non HMO) | Admitting: General Surgery

## 2012-11-18 ENCOUNTER — Ambulatory Visit (INDEPENDENT_AMBULATORY_CARE_PROVIDER_SITE_OTHER): Payer: Managed Care, Other (non HMO) | Admitting: Internal Medicine

## 2012-11-18 ENCOUNTER — Encounter: Payer: Self-pay | Admitting: Internal Medicine

## 2012-11-18 VITALS — BP 110/80 | HR 81 | Temp 98.6°F | Ht 66.0 in | Wt 161.5 lb

## 2012-11-18 DIAGNOSIS — F411 Generalized anxiety disorder: Secondary | ICD-10-CM

## 2012-11-18 DIAGNOSIS — J069 Acute upper respiratory infection, unspecified: Secondary | ICD-10-CM | POA: Insufficient documentation

## 2012-11-18 DIAGNOSIS — J309 Allergic rhinitis, unspecified: Secondary | ICD-10-CM

## 2012-11-18 DIAGNOSIS — F419 Anxiety disorder, unspecified: Secondary | ICD-10-CM

## 2012-11-18 MED ORDER — LEVOFLOXACIN 250 MG PO TABS: 250.0000 mg | ORAL_TABLET | Freq: Every day | ORAL | Status: DC

## 2012-11-18 NOTE — Patient Instructions (Signed)
Please take all new medication as prescribed Please continue all other medications as before 

## 2012-11-18 NOTE — Progress Notes (Signed)
Subjective:    Patient ID: Robin Farley, female    DOB: 03/14/1966, 47 y.o.   MRN: [id number]  HPI   Here with 2-3 days acute onset fever, facial pain, pressure, headache, general weakness and malaise, and greenish d/c, with mild ST and cough, but pt denies chest pain, wheezing, increased sob or doe, orthopnea, PND, increased LE swelling, palpitations, dizziness or syncope.  Does have several wks ongoing nasal allergy symptoms with clearish congestion, itch and sneezing, without fever, pain, ST, cough, swelling or wheezing.  Denies worsening depressive symptoms, suicidal ideation, or panic; has ongoing anxiety, not increased recently. Past Medical History  Diagnosis Date  . HYPERLIPIDEMIA 02/23/2007  . ANEMIA 02/05/2007  . DEPRESSION 02/23/2007  . ADD 03/13/2010  . Acute sinusitis, unspecified 02/26/2007  . Deviated nasal septum 02/05/2007  . ALLERGIC RHINITIS 02/23/2007  . GERD 12/27/2008  . UTI 02/23/2007  . FATIGUE 02/23/2007  . SWELLING MASS OR LUMP IN HEAD AND NECK 07/27/2008  . Abdominal pain, generalized 12/27/2008  . ATTENTION DEFICIT DISORDER, HX OF 02/05/2007  . Depression 02/18/2011  . Anxiety 11/17/2011   Past Surgical History  Procedure Laterality Date  . Nasal septum surgery    . Cesarean section    . Melanoma excision with sentinel lymph node biopsy Left 10/24/2012    Procedure: MELANOMA wide EXCISION left lateral thigh WITH SENTINEL LYMPH NODE BIOPSY left groin;  Surgeon: Mariella Saa, MD;  Location: [facility];  Service: General;  Laterality: Left;    reports that she has never smoked. She does not have any smokeless tobacco history on file. She reports that  drinks alcohol. She reports that she does not use illicit drugs. family history includes Alcohol abuse in her other; Atrial fibrillation in her father and sister; COPD in her father; Depression in her other; Glaucoma in her other; and Hypertension in her mother. Allergies  Allergen Reactions  .  Promethazine Hcl     Legs twitch   Current Outpatient Prescriptions on File Prior to Visit  Medication Sig Dispense Refill  . amphetamine-dextroamphetamine (ADDERALL) 20 MG tablet Take 1 tablet (20 mg total) by mouth 2 (two) times daily.  60 tablet  0  . aspirin 81 MG EC tablet Take 81 mg by mouth daily.        Marland Kitchen oxyCODONE-acetaminophen (ROXICET) 5-325 MG per tablet Take 1 tablet by mouth every 4 (four) hours as needed for pain.  30 tablet  0   No current facility-administered medications on file prior to visit.   Review of Systems  Constitutional: Negative for unexpected weight change, or unusual diaphoresis  HENT: Negative for tinnitus.   Eyes: Negative for photophobia and visual disturbance.  Respiratory: Negative for choking and stridor.   Gastrointestinal: Negative for vomiting and blood in stool.  Genitourinary: Negative for hematuria and decreased urine volume.  Musculoskeletal: Negative for acute joint swelling Skin: Negative for color change and wound.  Neurological: Negative for tremors and numbness other than noted  Psychiatric/Behavioral: Negative for decreased concentration or  hyperactivity.       Objective:   Physical Exam BP 110/80  Pulse 81  Temp(Src) 98.6 F (37 C) (Oral)  Ht 5\' 6"  (1.676 m)  Wt 161 lb 8 oz (73.256 kg)  BMI 26.08 kg/m2  SpO2 98% VS noted, mild ill Constitutional: Pt appears well-developed and well-nourished.  HENT: Head: NCAT.  Right Ear: External ear normal.  Left Ear: External ear normal.  Bilat tm's with mild erythema.  Max sinus areas  mild tender.  Pharynx with mild erythema, no exudate Eyes: Conjunctivae and EOM are normal. Pupils are equal, round, and reactive to light.  Neck: Normal range of motion. Neck supple.  Cardiovascular: Normal rate and regular rhythm.   Pulmonary/Chest: Effort normal and breath sounds normal.  Neurological: Pt is alert. Not confused  Skin: Skin is warm. No erythema.  Psychiatric: Pt behavior is normal.  Thought content normal.     Assessment & Plan:

## 2012-11-18 NOTE — Assessment & Plan Note (Signed)
For otc allegra prn,  to f/u any worsening symptoms or concerns  

## 2012-11-18 NOTE — Assessment & Plan Note (Signed)
stable overall by history and exam, recent data reviewed with pt, and pt to continue medical treatment as before,  to f/u any worsening symptoms or concerns Lab Results  Component Value Date   WBC 4.9 02/18/2011   HGB 14.2 10/24/2012   HCT 37.2 02/18/2011   PLT 280.0 02/18/2011   GLUCOSE 86 02/18/2011   CHOL 248* 02/18/2011   TRIG 74.0 02/18/2011   HDL 68.10 02/18/2011   LDLDIRECT 177.8 02/18/2011   ALT 11 02/18/2011   AST 14 02/18/2011   NA 143 02/18/2011   K 4.4 02/18/2011   CL 108 02/18/2011   CREATININE 0.9 02/18/2011   BUN 13 02/18/2011   CO2 29 02/18/2011   TSH 1.70 02/18/2011

## 2012-11-18 NOTE — Assessment & Plan Note (Signed)
Mild to mod, for antibx course,  to f/u any worsening symptoms or concerns 

## 2012-12-02 ENCOUNTER — Encounter (INDEPENDENT_AMBULATORY_CARE_PROVIDER_SITE_OTHER): Payer: Self-pay | Admitting: General Surgery

## 2012-12-02 ENCOUNTER — Ambulatory Visit (INDEPENDENT_AMBULATORY_CARE_PROVIDER_SITE_OTHER): Payer: Managed Care, Other (non HMO) | Admitting: General Surgery

## 2012-12-02 VITALS — BP 114/72 | HR 74 | Temp 97.1°F | Resp 16 | Ht 66.0 in | Wt 157.8 lb

## 2012-12-02 DIAGNOSIS — Z09 Encounter for follow-up examination after completed treatment for conditions other than malignant neoplasm: Secondary | ICD-10-CM

## 2012-12-02 NOTE — Progress Notes (Signed)
History: Patient returns following excision of malignant melanoma from her left thigh with left inguinal sentinel lymph node biopsy. She has done well with no complaints.  Exam: BP 114/72  Pulse 74  Temp(Src) 97.1 F (36.2 C) (Temporal)  Resp 16  Ht 5\' 6"  (1.676 m)  Wt 157 lb 12.8 oz (71.578 kg)  BMI 25.48 kg/m2 Her thigh and axillary wounds are well healed without complication.  We had previously reviewed her pathology which showed no evidence of melanoma in the lymph node and a Clark's level III superficial spreading 1.1 mm malignant melanoma with all favorable prognostic indicators.  Assessment and plan: Doing well following surgery as above. She is scheduled for careful followup screening by Dr. Terri Piedra every 6 months. I will see her back as needed.

## 2013-03-23 ENCOUNTER — Other Ambulatory Visit: Payer: Self-pay | Admitting: Physician Assistant

## 2013-04-25 ENCOUNTER — Other Ambulatory Visit: Payer: Self-pay | Admitting: Physician Assistant

## 2013-04-27 ENCOUNTER — Encounter: Payer: Self-pay | Admitting: Internal Medicine

## 2013-04-27 ENCOUNTER — Ambulatory Visit (INDEPENDENT_AMBULATORY_CARE_PROVIDER_SITE_OTHER): Payer: Managed Care, Other (non HMO) | Admitting: Internal Medicine

## 2013-04-27 VITALS — BP 120/80 | HR 85 | Temp 98.3°F | Ht 66.0 in | Wt 170.4 lb

## 2013-04-27 DIAGNOSIS — J019 Acute sinusitis, unspecified: Secondary | ICD-10-CM

## 2013-04-27 DIAGNOSIS — R110 Nausea: Secondary | ICD-10-CM

## 2013-04-27 DIAGNOSIS — F988 Other specified behavioral and emotional disorders with onset usually occurring in childhood and adolescence: Secondary | ICD-10-CM

## 2013-04-27 MED ORDER — AMPHETAMINE-DEXTROAMPHETAMINE 20 MG PO TABS: 20.0000 mg | ORAL_TABLET | Freq: Two times a day (BID) | ORAL | Status: DC

## 2013-04-27 MED ORDER — LEVOFLOXACIN 250 MG PO TABS
250.0000 mg | ORAL_TABLET | Freq: Every day | ORAL | Status: DC
Start: 2013-04-27 — End: 2014-08-21

## 2013-04-27 MED ORDER — HYDROCODONE-HOMATROPINE 5-1.5 MG/5ML PO SYRP: 5.0000 mL | ORAL_SOLUTION | Freq: Four times a day (QID) | ORAL | Status: DC | PRN

## 2013-04-27 MED ORDER — ONDANSETRON HCL 4 MG PO TABS: 4.0000 mg | ORAL_TABLET | Freq: Three times a day (TID) | ORAL | Status: DC | PRN

## 2013-04-27 NOTE — Patient Instructions (Signed)
Please take all new medication as prescribed - the antibiotic, cough medicine, and nausea medication Please continue all other medications as before, and refills have been done if requested - the adderall  Please have the pharmacy call with any other refills you may need.  Please remember to sign up for My Chart if you have not done so, as this will be important to you in the future with finding out test results, communicating by private email, and scheduling acute appointments online when needed.

## 2013-04-27 NOTE — Progress Notes (Signed)
Pre-visit discussion using our clinic review tool. No additional management support is needed unless otherwise documented below in the visit note.  

## 2013-04-29 NOTE — Assessment & Plan Note (Signed)
Mild to mod, for antibx course,  to f/u any worsening symptoms or concerns 

## 2013-04-29 NOTE — Assessment & Plan Note (Signed)
For antiemetic prn,  to f/u any worsening symptoms or concerns  

## 2013-04-29 NOTE — Assessment & Plan Note (Signed)
Stable, for med refill 

## 2013-04-29 NOTE — Progress Notes (Signed)
Subjective:    Patient ID: Robin Farley, female    DOB: 1966-03-08, 48 y.o.   MRN: [id number]  HPI  Here with 2-3 days acute onset fever, facial pain, pressure, headache, general weakness and malaise, and greenish d/c, with mild ST and cough, but pt denies chest pain, wheezing, increased sob or doe, orthopnea, PND, increased LE swelling, palpitations, dizziness or syncope.  Has also had mild intermittent naausea, but Denies worsening reflux, abd pain, dysphagia, vomit, bowel change or blood. ADD med working well, due for refill, work and social function ok. Past Medical History  Diagnosis Date  . HYPERLIPIDEMIA 02/23/2007  . ANEMIA 02/05/2007  . DEPRESSION 02/23/2007  . ADD 03/13/2010  . Acute sinusitis, unspecified 02/26/2007  . Deviated nasal septum 02/05/2007  . ALLERGIC RHINITIS 02/23/2007  . GERD 12/27/2008  . UTI 02/23/2007  . FATIGUE 02/23/2007  . SWELLING MASS OR LUMP IN HEAD AND NECK 07/27/2008  . Abdominal pain, generalized 12/27/2008  . ATTENTION DEFICIT DISORDER, HX OF 02/05/2007  . Depression 02/18/2011  . Anxiety 11/17/2011   Past Surgical History  Procedure Laterality Date  . Nasal septum surgery    . Cesarean section    . Melanoma excision with sentinel lymph node biopsy Left 10/24/2012    Procedure: MELANOMA wide EXCISION left lateral thigh WITH SENTINEL LYMPH NODE BIOPSY left groin;  Surgeon: Edward Jolly, MD;  Location: [place];  Service: General;  Laterality: Left;    reports that she has never smoked. She does not have any smokeless tobacco history on file. She reports that she drinks alcohol. She reports that she does not use illicit drugs. family history includes Alcohol abuse in her other; Atrial fibrillation in her father and sister; COPD in her father; Depression in her other; Glaucoma in her other; Hypertension in her mother. Allergies  Allergen Reactions  . Promethazine Hcl     Legs twitch   Current Outpatient Prescriptions on File  Prior to Visit  Medication Sig Dispense Refill  . aspirin 81 MG EC tablet Take 81 mg by mouth daily.         No current facility-administered medications on file prior to visit.   Review of Systems  Constitutional: Negative for unexpected weight change, or unusual diaphoresis  HENT: Negative for tinnitus.   Eyes: Negative for photophobia and visual disturbance.  Respiratory: Negative for choking and stridor.   Gastrointestinal: Negative for vomiting and blood in stool.  Genitourinary: Negative for hematuria and decreased urine volume.  Musculoskeletal: Negative for acute joint swelling Skin: Negative for color change and wound.  Neurological: Negative for tremors and numbness other than noted  Psychiatric/Behavioral: Negative for decreased concentration or  hyperactivity.       Objective:   Physical Exam BP 120/80  Pulse 85  Temp(Src) 98.3 F (36.8 C) (Oral)  Ht 5\' 6"  (1.676 m)  Wt 170 lb 6 oz (77.282 kg)  BMI 27.51 kg/m2  SpO2 98% VS noted, mild ill Constitutional: Pt appears well-developed and well-nourished.  HENT: Head: NCAT.  Right Ear: External ear normal.  Left Ear: External ear normal.  Bilat tm's with mild erythema.  Max sinus areas non tender.  Pharynx with mild erythema, no exudate Eyes: Conjunctivae and EOM are normal. Pupils are equal, round, and reactive to light.  Neck: Normal range of motion. Neck supple.  Cardiovascular: Normal rate and regular rhythm.   Pulmonary/Chest: Effort normal and breath sounds normal.  - no rale sob wheezing Abd; soft, NT Neurological: Pt  is alert. Not confused  Skin: Skin is warm. No erythema.  Psychiatric: Pt behavior is normal. Thought content normal.         Assessment & Plan:

## 2013-12-28 ENCOUNTER — Telehealth: Payer: Self-pay | Admitting: Internal Medicine

## 2013-12-28 DIAGNOSIS — F988 Other specified behavioral and emotional disorders with onset usually occurring in childhood and adolescence: Secondary | ICD-10-CM

## 2013-12-28 MED ORDER — AMPHETAMINE-DEXTROAMPHETAMINE 20 MG PO TABS: 20.0000 mg | ORAL_TABLET | Freq: Two times a day (BID) | ORAL | Status: DC

## 2013-12-28 NOTE — Telephone Encounter (Signed)
Patient is requesting refill on adderall.   °

## 2013-12-28 NOTE — Telephone Encounter (Signed)
Done hardcopy to robin  

## 2013-12-29 NOTE — Telephone Encounter (Signed)
Called the patient informed to pickup hardcopy at the front desk.  Had to leave a detailed message on phone.

## 2014-04-13 HISTORY — PX: COLONOSCOPY: SHX174

## 2014-05-01 ENCOUNTER — Telehealth: Payer: Self-pay | Admitting: Internal Medicine

## 2014-05-01 DIAGNOSIS — F988 Other specified behavioral and emotional disorders with onset usually occurring in childhood and adolescence: Secondary | ICD-10-CM

## 2014-05-01 MED ORDER — AMPHETAMINE-DEXTROAMPHETAMINE 20 MG PO TABS
20.0000 mg | ORAL_TABLET | Freq: Two times a day (BID) | ORAL | Status: DC
Start: 2014-05-01 — End: 2014-07-19

## 2014-05-01 NOTE — Telephone Encounter (Signed)
Done hardcopy to robin  

## 2014-05-01 NOTE — Telephone Encounter (Signed)
Pt request refill for Adderall. Please call pt

## 2014-05-02 NOTE — Telephone Encounter (Signed)
Called left the patient a detailed message hardcopy of adderall is ready for pickup at the front desk.

## 2014-05-04 ENCOUNTER — Encounter: Payer: Managed Care, Other (non HMO) | Admitting: Internal Medicine

## 2014-07-19 ENCOUNTER — Telehealth: Payer: Self-pay | Admitting: Internal Medicine

## 2014-07-19 DIAGNOSIS — F988 Other specified behavioral and emotional disorders with onset usually occurring in childhood and adolescence: Secondary | ICD-10-CM

## 2014-07-19 MED ORDER — AMPHETAMINE-DEXTROAMPHETAMINE 20 MG PO TABS
20.0000 mg | ORAL_TABLET | Freq: Two times a day (BID) | ORAL | Status: DC
Start: 2014-07-19 — End: 2014-08-21

## 2014-07-19 NOTE — Telephone Encounter (Signed)
Done hardcopy to cherina 

## 2014-07-19 NOTE — Telephone Encounter (Signed)
Pt request refill for adderrall, pt has an appt for a physical on 08/15/14.

## 2014-07-19 NOTE — Telephone Encounter (Signed)
Pt. Notified.

## 2014-08-15 ENCOUNTER — Encounter: Payer: Managed Care, Other (non HMO) | Admitting: Internal Medicine

## 2014-08-15 DIAGNOSIS — Z0289 Encounter for other administrative examinations: Secondary | ICD-10-CM

## 2014-08-21 ENCOUNTER — Ambulatory Visit (INDEPENDENT_AMBULATORY_CARE_PROVIDER_SITE_OTHER): Payer: Managed Care, Other (non HMO) | Admitting: Internal Medicine

## 2014-08-21 ENCOUNTER — Encounter: Payer: Self-pay | Admitting: Internal Medicine

## 2014-08-21 ENCOUNTER — Other Ambulatory Visit (INDEPENDENT_AMBULATORY_CARE_PROVIDER_SITE_OTHER): Payer: Managed Care, Other (non HMO)

## 2014-08-21 VITALS — BP 122/80 | HR 80 | Temp 98.6°F | Resp 18 | Ht 66.0 in | Wt 169.0 lb

## 2014-08-21 DIAGNOSIS — F909 Attention-deficit hyperactivity disorder, unspecified type: Secondary | ICD-10-CM | POA: Diagnosis not present

## 2014-08-21 DIAGNOSIS — E785 Hyperlipidemia, unspecified: Secondary | ICD-10-CM | POA: Diagnosis not present

## 2014-08-21 DIAGNOSIS — Z0000 Encounter for general adult medical examination without abnormal findings: Secondary | ICD-10-CM

## 2014-08-21 DIAGNOSIS — R7989 Other specified abnormal findings of blood chemistry: Secondary | ICD-10-CM | POA: Diagnosis not present

## 2014-08-21 DIAGNOSIS — F988 Other specified behavioral and emotional disorders with onset usually occurring in childhood and adolescence: Secondary | ICD-10-CM

## 2014-08-21 LAB — CBC WITH DIFFERENTIAL/PLATELET
BASOS ABS: 0 10*3/uL (ref 0.0–0.1)
Basophils Relative: 0.2 % (ref 0.0–3.0)
Eosinophils Absolute: 0.1 10*3/uL (ref 0.0–0.7)
Eosinophils Relative: 1.8 % (ref 0.0–5.0)
HEMATOCRIT: 40 % (ref 36.0–46.0)
HEMOGLOBIN: 13.6 g/dL (ref 12.0–15.0)
LYMPHS PCT: 36 % (ref 12.0–46.0)
Lymphs Abs: 1.9 10*3/uL (ref 0.7–4.0)
MCHC: 34 g/dL (ref 30.0–36.0)
MCV: 88.3 fl (ref 78.0–100.0)
MONOS PCT: 7.7 % (ref 3.0–12.0)
Monocytes Absolute: 0.4 10*3/uL (ref 0.1–1.0)
NEUTROS PCT: 54.3 % (ref 43.0–77.0)
Neutro Abs: 2.8 10*3/uL (ref 1.4–7.7)
PLATELETS: 282 10*3/uL (ref 150.0–400.0)
RBC: 4.52 Mil/uL (ref 3.87–5.11)
RDW: 13.1 % (ref 11.5–15.5)
WBC: 5.2 10*3/uL (ref 4.0–10.5)

## 2014-08-21 LAB — URINALYSIS, ROUTINE W REFLEX MICROSCOPIC
Bilirubin Urine: NEGATIVE
KETONES UR: NEGATIVE
Leukocytes, UA: NEGATIVE
Nitrite: NEGATIVE
PH: 6 (ref 5.0–8.0)
TOTAL PROTEIN, URINE-UPE24: NEGATIVE
URINE GLUCOSE: NEGATIVE
Urobilinogen, UA: 0.2 (ref 0.0–1.0)

## 2014-08-21 LAB — HEPATIC FUNCTION PANEL
ALT: 11 U/L (ref 0–35)
AST: 13 U/L (ref 0–37)
Albumin: 4.1 g/dL (ref 3.5–5.2)
Alkaline Phosphatase: 59 U/L (ref 39–117)
BILIRUBIN TOTAL: 0.3 mg/dL (ref 0.2–1.2)
Bilirubin, Direct: 0.1 mg/dL (ref 0.0–0.3)
Total Protein: 7.4 g/dL (ref 6.0–8.3)

## 2014-08-21 LAB — BASIC METABOLIC PANEL
BUN: 14 mg/dL (ref 6–23)
CALCIUM: 9.2 mg/dL (ref 8.4–10.5)
CO2: 29 meq/L (ref 19–32)
Chloride: 104 mEq/L (ref 96–112)
Creatinine, Ser: 0.81 mg/dL (ref 0.40–1.20)
GFR: 79.21 mL/min (ref >60.00)
Glucose, Bld: 84 mg/dL (ref 70–99)
Potassium: 3.8 mEq/L (ref 3.5–5.1)
SODIUM: 136 meq/L (ref 135–145)

## 2014-08-21 LAB — LIPID PANEL
CHOL/HDL RATIO: 4
CHOLESTEROL: 259 mg/dL — AB (ref 0–200)
HDL: 62 mg/dL (ref >39.00)
NONHDL: 197
Triglycerides: 219 mg/dL — ABNORMAL HIGH (ref 0.0–149.0)
VLDL: 43.8 mg/dL — ABNORMAL HIGH (ref 0.0–40.0)

## 2014-08-21 LAB — LDL CHOLESTEROL, DIRECT: Direct LDL: 159 mg/dL

## 2014-08-21 LAB — TSH: TSH: 2.15 u[IU]/mL (ref 0.35–4.50)

## 2014-08-21 MED ORDER — AMPHETAMINE-DEXTROAMPHETAMINE 20 MG PO TABS: ORAL_TABLET | ORAL | Status: DC

## 2014-08-21 NOTE — Assessment & Plan Note (Signed)
For lower chol diet, declines statin  Lab Results  Component Value Date   CHOL 248* 02/18/2011   HDL 68.10 02/18/2011   LDLDIRECT 177.8 02/18/2011   TRIG 74.0 02/18/2011   CHOLHDL 4 02/18/2011

## 2014-08-21 NOTE — Progress Notes (Signed)
Subjective:    Patient ID: Robin Farley, female    DOB: 12/04/1965, 49 y.o.   MRN: [id number]  HPI   Here for wellness and f/u;  Overall doing ok;  Pt denies Chest pain, worsening SOB, DOE, wheezing, orthopnea, PND, worsening LE edema, palpitations, dizziness or syncope.  Pt denies neurological change such as new headache, facial or extremity weakness.  Pt denies polydipsia, polyuria, or low sugar symptoms. Pt states overall good compliance with treatment and medications, good tolerability, and has been trying to follow appropriate diet.  Pt denies worsening depressive symptoms, suicidal ideation or panic. No fever, night sweats, wt loss, loss of appetite, or other constitutional symptoms.  Pt states good ability with ADL's, has low fall risk, home safety reviewed and adequate, no other significant changes in hearing or vision, and only occasionally active with exercise.  Has gained some wt, plans to be more active. C/o different manufacturer for the adderall, not working as well, less prod at work., [employer] from Last 3 Encounters:  08/21/14 169 lb (76.658 kg)  04/27/13 170 lb 6 oz (77.282 kg)  12/02/12 157 lb 12.8 oz (71.578 kg)   Past Medical History  Diagnosis Date  . HYPERLIPIDEMIA 02/23/2007  . ANEMIA 02/05/2007  . DEPRESSION 02/23/2007  . ADD 03/13/2010  . Acute sinusitis, unspecified 02/26/2007  . Deviated nasal septum 02/05/2007  . ALLERGIC RHINITIS 02/23/2007  . GERD 12/27/2008  . UTI 02/23/2007  . FATIGUE 02/23/2007  . SWELLING MASS OR LUMP IN HEAD AND NECK 07/27/2008  . Abdominal pain, generalized 12/27/2008  . ATTENTION DEFICIT DISORDER, HX OF 02/05/2007  . Depression 02/18/2011  . Anxiety 11/17/2011   Past Surgical History  Procedure Laterality Date  . Nasal septum surgery    . Cesarean section    . Melanoma excision with sentinel lymph node biopsy Left 10/24/2012    Procedure: MELANOMA wide EXCISION left lateral thigh WITH SENTINEL LYMPH NODE BIOPSY left groin;  Surgeon:  Edward Jolly, MD;  Location: [place];  Service: General;  Laterality: Left;    reports that she has never smoked. She does not have any smokeless tobacco history on file. She reports that she drinks alcohol. She reports that she does not use illicit drugs. family history includes Alcohol abuse in her other; Atrial fibrillation in her father and sister; COPD in her father; Depression in her other; Glaucoma in her other; Hypertension in her mother. Allergies  Allergen Reactions  . Promethazine Hcl     Legs twitch   Current Outpatient Prescriptions on File Prior to Visit  Medication Sig Dispense Refill  . amphetamine-dextroamphetamine (ADDERALL) 20 MG tablet Take 1 tablet (20 mg total) by mouth 2 (two) times daily. 60 tablet 0  . aspirin 81 MG EC tablet Take 81 mg by mouth daily.       No current facility-administered medications on file prior to visit.    Review of Systems Constitutional: Negative for increased diaphoresis, other activity, appetite or siginficant weight change other than noted HENT: Negative for worsening hearing loss, ear pain, facial swelling, mouth sores and neck stiffness.   Eyes: Negative for other worsening pain, redness or visual disturbance.  Respiratory: Negative for shortness of breath and wheezing  Cardiovascular: Negative for chest pain and palpitations.  Gastrointestinal: Negative for diarrhea, blood in stool, abdominal distention or other pain Genitourinary: Negative for hematuria, flank pain or change in urine volume.  Musculoskeletal: Negative for myalgias or other joint complaints.  Skin: Negative for color  change and wound or drainage.  Neurological: Negative for syncope and numbness. other than noted Hematological: Negative for adenopathy. or other swelling Psychiatric/Behavioral: Negative for hallucinations, SI, self-injury, decreased concentration or other worsening agitation.      Objective:   Physical Exam BP 122/80 mmHg   Pulse 80  Temp(Src) 98.6 F (37 C) (Oral)  Resp 18  Ht 5\' 6"  (1.676 m)  Wt 169 lb (76.658 kg)  BMI 27.29 kg/m2  SpO2 99%  LMP 08/14/2014 VS noted,  Constitutional: Pt is oriented to person, place, and time. Appears well-developed and well-nourished, in no significant distress Head: Normocephalic and atraumatic.  Right Ear: External ear normal.  Left Ear: External ear normal.  Nose: Nose normal.  Mouth/Throat: Oropharynx is clear and moist.  Eyes: Conjunctivae and EOM are normal. Pupils are equal, round, and reactive to light.  Neck: Normal range of motion. Neck supple. No JVD present. No tracheal deviation present or significant neck LA or mass Cardiovascular: Normal rate, regular rhythm, normal heart sounds and intact distal pulses.   Pulmonary/Chest: Effort normal and breath sounds without rales or wheezing  Abdominal: Soft. Bowel sounds are normal. NT. No HSM  Musculoskeletal: Normal range of motion. Exhibits no edema.  Lymphadenopathy:  Has no cervical adenopathy.  Neurological: Pt is alert and oriented to person, place, and time. Pt has normal reflexes. No cranial nerve deficit. Motor grossly intact Skin: Skin is warm and dry. No rash noted.  Psychiatric:  Has normal mood and affect. Behavior is normal.     Assessment & Plan:

## 2014-08-21 NOTE — Patient Instructions (Addendum)
OK to increase the adderall to 30 mg  Please continue all other medications as before, and refills have been done if requested.  Please have the pharmacy call with any other refills you may need.  Please continue your efforts at being more active, low cholesterol diet, and weight control.  You are otherwise up to date with prevention measures today.  Please keep your appointments with your specialists as you may have planned  You will be contacted regarding the referral for: mammogram  Please call GYN for yearly follow up  You will be contacted regarding the referral for: colonoscopy  Please go to the LAB in the Basement (turn left off the elevator) for the tests to be done today  You will be contacted by phone if any changes need to be made immediately.  Otherwise, you will receive a letter about your results with an explanation, but please check with MyChart first.  Please remember to sign up for MyChart if you have not done so, as this will be important to you in the future with finding out test results, communicating by private email, and scheduling acute appointments online when needed.  Please return in 1 year for your yearly visit, or sooner if needed, with Lab testing done 3-5 days before

## 2014-08-21 NOTE — Assessment & Plan Note (Signed)

## 2014-08-21 NOTE — Assessment & Plan Note (Signed)
Ok for increased adderall 30 mg

## 2014-08-21 NOTE — Progress Notes (Signed)
Pre visit review using our clinic review tool, if applicable. No additional management support is needed unless otherwise documented below in the visit note. 

## 2014-09-18 ENCOUNTER — Encounter: Payer: Self-pay | Admitting: Internal Medicine

## 2014-10-31 ENCOUNTER — Encounter: Payer: Self-pay | Admitting: Internal Medicine

## 2014-12-25 ENCOUNTER — Ambulatory Visit (AMBULATORY_SURGERY_CENTER): Payer: Self-pay | Admitting: *Deleted

## 2014-12-25 VITALS — Ht 66.25 in | Wt 178.0 lb

## 2014-12-25 DIAGNOSIS — Z1211 Encounter for screening for malignant neoplasm of colon: Secondary | ICD-10-CM

## 2014-12-25 MED ORDER — NA SULFATE-K SULFATE-MG SULF 17.5-3.13-1.6 GM/177ML PO SOLN: 1.0000 | Freq: Once | ORAL | Status: DC

## 2014-12-25 NOTE — Progress Notes (Signed)
No egg or soy allergy. No anesthesia problems.  No home O2.  No diet meds.  

## 2015-01-08 ENCOUNTER — Encounter: Payer: Self-pay | Admitting: Internal Medicine

## 2015-01-08 ENCOUNTER — Ambulatory Visit (AMBULATORY_SURGERY_CENTER): Payer: Managed Care, Other (non HMO) | Admitting: Internal Medicine

## 2015-01-08 VITALS — BP 105/72 | HR 69 | Temp 98.5°F | Resp 22 | Ht 66.0 in | Wt 169.0 lb

## 2015-01-08 DIAGNOSIS — D125 Benign neoplasm of sigmoid colon: Secondary | ICD-10-CM

## 2015-01-08 DIAGNOSIS — D128 Benign neoplasm of rectum: Secondary | ICD-10-CM

## 2015-01-08 DIAGNOSIS — K621 Rectal polyp: Secondary | ICD-10-CM

## 2015-01-08 DIAGNOSIS — D120 Benign neoplasm of cecum: Secondary | ICD-10-CM | POA: Diagnosis not present

## 2015-01-08 DIAGNOSIS — K635 Polyp of colon: Secondary | ICD-10-CM | POA: Diagnosis not present

## 2015-01-08 DIAGNOSIS — Z1211 Encounter for screening for malignant neoplasm of colon: Secondary | ICD-10-CM

## 2015-01-08 MED ORDER — SODIUM CHLORIDE 0.9 % IV SOLN: 500.0000 mL | INTRAVENOUS | Status: DC

## 2015-01-08 NOTE — Progress Notes (Signed)
Report to PACU, RN, vss, BBS= Clear.  

## 2015-01-08 NOTE — Progress Notes (Signed)
Called to room to assist during endoscopic procedure.  Patient ID and intended procedure confirmed with present staff. Received instructions for my participation in the procedure from the performing physician.  

## 2015-01-08 NOTE — Op Note (Signed)
[place]  [address] [place], [id number]   COLONOSCOPY PROCEDURE REPORT  PATIENT: Robin Farley, Robin Farley  MR#: [id number] BIRTHDATE: 1965/10/25 , 49  yrs. old GENDER: female ENDOSCOPIST: Eustace Quail, MD REFERRED UQ:[id number] Andri Prestia, M.D. PROCEDURE DATE:  01/08/2015 PROCEDURE:   Colonoscopy, screening and Colonoscopy with snare polypectomy x 3 First Screening Colonoscopy - Avg.  risk and is 50 yrs.  old or older Yes.  Prior Negative Screening - Now for repeat screening. N/A  History of Adenoma - Now for follow-up colonoscopy & has been > or = to 3 yrs.  N/A  Polyps removed today? Yes ASA CLASS:   Class I INDICATIONS:Screening for colonic neoplasia and Colorectal Neoplasm Risk Assessment for this procedure is average risk. MEDICATIONS: Monitored anesthesia care and Propofol 240 mg IV  DESCRIPTION OF PROCEDURE:   After the risks benefits and alternatives of the procedure were thoroughly explained, informed consent was obtained.  The digital rectal exam revealed no abnormalities of the rectum.   The LB [id number]  endoscope was introduced through the anus and advanced to the cecum, which was identified by both the appendix and ileocecal valve. No adverse events experienced.   The quality of the prep was excellent. (Suprep was used)  The instrument was then slowly withdrawn as the colon was fully examined. Estimated blood loss is zero unless otherwise noted in this procedure report.  COLON FINDINGS: Three polyps measuring 3 mm in size were found at the cecum, in the sigmoid colon, and rectum.  A polypectomy was performed with a cold snare.  The resection was complete, the polyp tissue was completely retrieved and sent to histology.   There was moderate diverticulosis noted throughout the entire examined colon. The examination was otherwise normal.  Retroflexed views revealed internal hemorrhoids. The time to cecum = 2.6 Withdrawal time = 14.5   The scope was  withdrawn and the procedure completed. COMPLICATIONS: There were no immediate complications.  ENDOSCOPIC IMPRESSION: 1.   Three polyps were found at the cecum, in the sigmoid colon, and rectum; polypectomy was performed with a cold snare 2.   Moderate diverticulosis was noted throughout the entire examined colon 3.   The examination was otherwise normal  RECOMMENDATIONS: 1. Follow up colonoscopy in 5 years  eSigned:  Eustace Quail, MD 01/08/2015 2:22 PM   cc: The Patient and [name], MD

## 2015-01-08 NOTE — Patient Instructions (Signed)
YOU HAD AN ENDOSCOPIC PROCEDURE TODAY AT [facility]:   Refer to the procedure report that was given to you for any specific questions about what was found during the examination.  If the procedure report does not answer your questions, please call your gastroenterologist to clarify.  If you requested that your care partner not be given the details of your procedure findings, then the procedure report has been included in a sealed envelope for you to review at your convenience later.  YOU SHOULD EXPECT: Some feelings of bloating in the abdomen. Passage of more gas than usual.  Walking can help get rid of the air that was put into your GI tract during the procedure and reduce the bloating. If you had a lower endoscopy (such as a colonoscopy or flexible sigmoidoscopy) you may notice spotting of blood in your stool or on the toilet paper. If you underwent a bowel prep for your procedure, you may not have a normal bowel movement for a few days.  Please Note:  You might notice some irritation and congestion in your nose or some drainage.  This is from the oxygen used during your procedure.  There is no need for concern and it should clear up in a day or so.  SYMPTOMS TO REPORT IMMEDIATELY:   Following lower endoscopy (colonoscopy or flexible sigmoidoscopy):  Excessive amounts of blood in the stool  Significant tenderness or worsening of abdominal pains  Swelling of the abdomen that is new, acute  Fever of 100F or higher   Vomiting of blood or coffee ground material   New chest pain or pain under the shoulder bladesFollowing upper endoscopy (EGD)    For urgent or emergent issues, a gastroenterologist can be reached at any hour by calling [phone].   DIET: Your first meal following the procedure should be a small meal and then it is ok to progress to your normal diet. Heavy or fried foods are harder to digest and may make you feel nauseous or bloated.  Likewise, meals heavy in  dairy and vegetables can increase bloating.  Drink plenty of fluids but you should avoid alcoholic beverages for 24 hours.  ACTIVITY:  You should plan to take it easy for the rest of today and you should NOT DRIVE or use heavy machinery until tomorrow (because of the sedation medicines used during the test).    FOLLOW UP: Our staff will call the number listed on your records the next business day following your procedure to check on you and address any questions or concerns that you may have regarding the information given to you following your procedure. If we do not reach you, we will leave a message.  However, if you are feeling well and you are not experiencing any problems, there is no need to return our call.  We will assume that you have returned to your regular daily activities without incident.  If any biopsies were taken you will be contacted by phone or by letter within the next 1-3 weeks.  Please call us at [phone] if you have not heard about the biopsies in 3 weeks.    SIGNATURES/CONFIDENTIALITY: You and/or your care partner have signed paperwork which will be entered into your electronic medical record.  These signatures attest to the fact that that the information above on your After Visit Summary has been reviewed and is understood.  Full responsibility of the confidentiality of this discharge information lies with you and/or your care-partner.  Information on polyps ,diverticulosis ,& high fiber diet given to you today

## 2015-01-09 ENCOUNTER — Telehealth: Payer: Self-pay | Admitting: *Deleted

## 2015-01-09 NOTE — Telephone Encounter (Signed)
  Follow up Call-  Call back number 01/08/2015  Post procedure Call Back phone  # [phone]  Permission to leave phone message Yes   [facility]

## 2015-01-14 ENCOUNTER — Encounter: Payer: Self-pay | Admitting: Internal Medicine

## 2015-01-21 ENCOUNTER — Ambulatory Visit (INDEPENDENT_AMBULATORY_CARE_PROVIDER_SITE_OTHER): Payer: Managed Care, Other (non HMO) | Admitting: Internal Medicine

## 2015-01-21 ENCOUNTER — Encounter: Payer: Self-pay | Admitting: Internal Medicine

## 2015-01-21 VITALS — BP 130/92 | HR 67 | Temp 98.1°F | Resp 16 | Wt 178.0 lb

## 2015-01-21 DIAGNOSIS — J0100 Acute maxillary sinusitis, unspecified: Secondary | ICD-10-CM

## 2015-01-21 NOTE — Progress Notes (Signed)
Pre visit review using our clinic review tool, if applicable. No additional management support is needed unless otherwise documented below in the visit note. 

## 2015-01-21 NOTE — Patient Instructions (Signed)
Continue with the over-the-counter medications.  Start taking advil with food.  If there is no improvement in the next 48 hours call back and we will send in an antibiotic.  Sinusitis, Adult Sinusitis is redness, soreness, and inflammation of the paranasal sinuses. Paranasal sinuses are air pockets within the bones of your face. They are located beneath your eyes, in the middle of your forehead, and above your eyes. In healthy paranasal sinuses, mucus is able to drain out, and air is able to circulate through them by way of your nose. However, when your paranasal sinuses are inflamed, mucus and air can become trapped. This can allow bacteria and other germs to grow and cause infection. Sinusitis can develop quickly and last only a short time (acute) or continue over a long period (chronic). Sinusitis that lasts for more than 12 weeks is considered chronic. CAUSES Causes of sinusitis include:  Allergies.  Structural abnormalities, such as displacement of the cartilage that separates your nostrils (deviated septum), which can decrease the air flow through your nose and sinuses and affect sinus drainage.  Functional abnormalities, such as when the small hairs (cilia) that line your sinuses and help remove mucus do not work properly or are not present. SIGNS AND SYMPTOMS Symptoms of acute and chronic sinusitis are the same. The primary symptoms are pain and pressure around the affected sinuses. Other symptoms include:  Upper toothache.  Earache.  Headache.  Bad breath.  Decreased sense of smell and taste.  A cough, which worsens when you are lying flat.  Fatigue.  Fever.  Thick drainage from your nose, which often is green and may contain pus (purulent).  Swelling and warmth over the affected sinuses. DIAGNOSIS Your health care provider will perform a physical exam. During your exam, your health care provider may perform any of the following to help determine if you have acute  sinusitis or chronic sinusitis:  Look in your nose for signs of abnormal growths in your nostrils (nasal polyps).  Tap over the affected sinus to check for signs of infection.  View the inside of your sinuses using an imaging device that has a light attached (endoscope). If your health care provider suspects that you have chronic sinusitis, one or more of the following tests may be recommended:  Allergy tests.  Nasal culture. A sample of mucus is taken from your nose, sent to a lab, and screened for bacteria.  Nasal cytology. A sample of mucus is taken from your nose and examined by your health care provider to determine if your sinusitis is related to an allergy. TREATMENT Most cases of acute sinusitis are related to a viral infection and will resolve on their own within 10 days. Sometimes, medicines are prescribed to help relieve symptoms of both acute and chronic sinusitis. These may include pain medicines, decongestants, nasal steroid sprays, or saline sprays. However, for sinusitis related to a bacterial infection, your health care provider will prescribe antibiotic medicines. These are medicines that will help kill the bacteria causing the infection. Rarely, sinusitis is caused by a fungal infection. In these cases, your health care provider will prescribe antifungal medicine. For some cases of chronic sinusitis, surgery is needed. Generally, these are cases in which sinusitis recurs more than 3 times per year, despite other treatments. HOME CARE INSTRUCTIONS  Drink plenty of water. Water helps thin the mucus so your sinuses can drain more easily.  Use a humidifier.  Inhale steam 3-4 times a day (for example, sit in the bathroom  with the shower running).  Apply a warm, moist washcloth to your face 3-4 times a day, or as directed by your health care provider.  Use saline nasal sprays to help moisten and clean your sinuses.  Take medicines only as directed by your health care  provider.  If you were prescribed either an antibiotic or antifungal medicine, finish it all even if you start to feel better. SEEK IMMEDIATE MEDICAL CARE IF:  You have increasing pain or severe headaches.  You have nausea, vomiting, or drowsiness.  You have swelling around your face.  You have vision problems.  You have a stiff neck.  You have difficulty breathing.   This information is not intended to replace advice given to you by your health care provider. Make sure you discuss any questions you have with your health care provider.   Document Released: 03/30/2005 Document Revised: 04/20/2014 Document Reviewed: 04/14/2011 Elsevier Interactive Patient Education [employer].

## 2015-01-21 NOTE — Progress Notes (Signed)
Subjective:    Patient ID: Robin Farley, female    DOB: [birth year], 49 y.o.   MRN: [id number]  HPI Her 5 days ago her symptoms started and she thought she was having a migraine, but the headache did not go away and she developed sinus pressure.  She has a history of sinus infections.    She has b/l frontal and maxillary sinus pressure and a generalized headache.  She had subjective fever one day, but none the past couple of days.  She has nasal congestion, ear pain, dry cough, post nasal drip, nausea and a sore throat.  She denies dizziness/, lightheadedness, wheeze, sob.  She usually has a lot of nasal discharge, but this time she does not.    She has taken benadryl, a decongestant, flonase and they have provided temporary relief only.  She has not taken tylenol or advil.  Medications and allergies reviewed with patient and updated if appropriate.  Patient Active Problem List   Diagnosis Date Noted  . Nausea alone 04/27/2013  . Melanoma of thigh ([place]) 10/06/2012  . Anxiety 11/17/2011  . Discoloration of skin of face 07/08/2011  . Right shoulder pain 07/08/2011  . Knee effusion, left 08/18/2010  . Preventative health care 08/18/2010  . Attention deficit disorder 03/13/2010  . GERD 12/27/2008  . Hyperlipidemia 02/23/2007  . DEPRESSION 02/23/2007  . ALLERGIC RHINITIS 02/23/2007  . FATIGUE 02/23/2007  . ANEMIA 02/05/2007  . Deviated nasal septum 02/05/2007    Past Medical History  Diagnosis Date  . HYPERLIPIDEMIA 02/23/2007  . ANEMIA 02/05/2007  . DEPRESSION 02/23/2007  . ADD 03/13/2010  . Acute sinusitis, unspecified 02/26/2007  . Deviated nasal septum 02/05/2007  . ALLERGIC RHINITIS 02/23/2007  . GERD 12/27/2008  . UTI 02/23/2007  . FATIGUE 02/23/2007  . SWELLING MASS OR LUMP IN HEAD AND NECK 07/27/2008  . Abdominal pain, generalized 12/27/2008  . ATTENTION DEFICIT DISORDER, HX OF 02/05/2007  . Depression 02/18/2011  . Anxiety 11/17/2011    Past Surgical History    Procedure Laterality Date  . Nasal septum surgery    . Cesarean section    . Melanoma excision with sentinel lymph node biopsy Left 10/24/2012    Procedure: MELANOMA wide EXCISION left lateral thigh WITH SENTINEL LYMPH NODE BIOPSY left groin;  Surgeon: Edward Jolly, MD;  Location: [facility];  Service: General;  Laterality: Left;  . Brow lift      Social History   Social History  . Marital Status: Married    Spouse Name: N/A  . Number of Children: 2  . Years of Education: N/A   Occupational History  . master's in fine arts [employer]    Social History Main Topics  . Smoking status: Never Smoker   . Smokeless tobacco: Never Used  . Alcohol Use: 0.0 oz/week    0 Standard drinks or equivalent per week     Comment: rare  . Drug Use: No  . Sexual Activity: Not Asked   Other Topics Concern  . None   Social History Narrative    Review of Systems See HPI     Objective:   Filed Vitals:   01/21/15 0939  BP: 130/92  Pulse: 67  Temp: 98.1 F (36.7 C)  Resp: 16   Filed Weights   01/21/15 0939  Weight: 178 lb (80.74 kg)   Body mass index is 28.74 kg/(m^2).   Physical Exam  Constitutional: She appears well-developed and well-nourished. No distress.  HENT:  Head:  Normocephalic and atraumatic.  Right Ear: External ear normal.  Left Ear: External ear normal.  Nose: Nose normal.  Mouth/Throat: Oropharynx is clear and moist. No oropharyngeal exudate.  Eyes: Conjunctivae are normal.  Neck: Neck supple. No thyromegaly present.  Cardiovascular: Normal rate, regular rhythm and normal heart sounds.   Pulmonary/Chest: Breath sounds normal. No respiratory distress. She has no wheezes.  Lymphadenopathy:    She has no cervical adenopathy.  Skin: She is not diaphoretic.          Assessment & Plan:   Acute sinus infection 5 days duration Likely viral in nature Will try to avoid an antibiotic - she will call back in 48 hours if no improvement Start  advil - take with food Continue flonase, decongestant Try saline nasal rinses/spray

## 2015-01-22 ENCOUNTER — Encounter: Payer: Managed Care, Other (non HMO) | Admitting: Internal Medicine

## 2015-06-24 ENCOUNTER — Ambulatory Visit (INDEPENDENT_AMBULATORY_CARE_PROVIDER_SITE_OTHER): Payer: Managed Care, Other (non HMO) | Admitting: Internal Medicine

## 2015-06-24 ENCOUNTER — Encounter: Payer: Self-pay | Admitting: Internal Medicine

## 2015-06-24 VITALS — BP 128/72 | HR 90 | Temp 98.2°F | Resp 20 | Wt 186.0 lb

## 2015-06-24 DIAGNOSIS — F988 Other specified behavioral and emotional disorders with onset usually occurring in childhood and adolescence: Secondary | ICD-10-CM

## 2015-06-24 DIAGNOSIS — L989 Disorder of the skin and subcutaneous tissue, unspecified: Secondary | ICD-10-CM

## 2015-06-24 DIAGNOSIS — E785 Hyperlipidemia, unspecified: Secondary | ICD-10-CM

## 2015-06-24 DIAGNOSIS — M25512 Pain in left shoulder: Secondary | ICD-10-CM | POA: Diagnosis not present

## 2015-06-24 DIAGNOSIS — Z0000 Encounter for general adult medical examination without abnormal findings: Secondary | ICD-10-CM

## 2015-06-24 DIAGNOSIS — F909 Attention-deficit hyperactivity disorder, unspecified type: Secondary | ICD-10-CM

## 2015-06-24 MED ORDER — AMPHETAMINE-DEXTROAMPHETAMINE 20 MG PO TABS: ORAL_TABLET | ORAL | Status: DC

## 2015-06-24 MED ORDER — DICLOFENAC SODIUM 75 MG PO TBEC: 75.0000 mg | DELAYED_RELEASE_TABLET | Freq: Two times a day (BID) | ORAL | Status: DC

## 2015-06-24 NOTE — Progress Notes (Signed)
Pre visit review using our clinic review tool, if applicable. No additional management support is needed unless otherwise documented below in the visit note. 

## 2015-06-24 NOTE — Assessment & Plan Note (Signed)
Likely bursitis, for nsaid prn, refer sport medicine for cortisone if needed,  to f/u any worsening symptoms or concerns

## 2015-06-24 NOTE — Patient Instructions (Signed)
Please take all new medication as prescribed - the anti-inflammatory  Please continue all other medications as before, and refills have been done if requested.  Please have the pharmacy call with any other refills you may need.  Please continue your efforts at being more active, low cholesterol diet, and weight control.  Please keep your appointments with your specialists as you may have planned  You will be contacted regarding the referral for: Dr Tamala Julian - sports medicine  Please return in 3 months, or sooner if needed, with Lab testing done 3-5 days before

## 2015-06-24 NOTE — Progress Notes (Signed)
Subjective:    Patient ID: Robin Farley, female    DOB: Jun 23, 1965, 50 y.o.   MRN: [id number]  HPI  Here to f/u, C/o left shoulder pain for 2 wks but some prior to that as well, now mod ro severe, with some vague swelling, constant, worse to abduct or otherwise move the shoulder but can get all ROM; husband concerned about rot cuff issue.  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  No neck or more distal LUE pain.  No falls or trauma, no fever or hx of gout.    Wt has increased dramatically, has not been taking the adderall recently, but has been drinking plenty of sugar colas at work, more than usual.  Has not been able to ride the bike as much as intended recently.  Asks for adderall refill as functions much better at work with this Wt Readings from Last 3 Encounters:  06/24/15 186 lb (84.369 kg)  01/21/15 178 lb (80.74 kg)  01/08/15 169 lb (76.658 kg)  Also has a enlarging skin lesion to left lower back, though may have had some redness around it yesterday, but none seen today.   Past Medical History  Diagnosis Date  . HYPERLIPIDEMIA 02/23/2007  . ANEMIA 02/05/2007  . DEPRESSION 02/23/2007  . ADD 03/13/2010  . Acute sinusitis, unspecified 02/26/2007  . Deviated nasal septum 02/05/2007  . ALLERGIC RHINITIS 02/23/2007  . GERD 12/27/2008  . UTI 02/23/2007  . FATIGUE 02/23/2007  . SWELLING MASS OR LUMP IN HEAD AND NECK 07/27/2008  . Abdominal pain, generalized 12/27/2008  . ATTENTION DEFICIT DISORDER, HX OF 02/05/2007  . Depression 02/18/2011  . Anxiety 11/17/2011   Past Surgical History  Procedure Laterality Date  . Nasal septum surgery    . Cesarean section    . Melanoma excision with sentinel lymph node biopsy Left 10/24/2012    Procedure: MELANOMA wide EXCISION left lateral thigh WITH SENTINEL LYMPH NODE BIOPSY left groin;  Surgeon: Edward Jolly, MD;  Location: [facility];   Service: General;  Laterality: Left;  . Brow lift      reports that she has never smoked. She has never used smokeless tobacco. She reports that she drinks alcohol. She reports that she does not use illicit drugs. family history includes Alcohol abuse in her other; Atrial fibrillation in her father and sister; COPD in her father; Depression in her other; Glaucoma in her other; Hypertension in her mother. There is no history of Colon polyps. Allergies  Allergen Reactions  . Promethazine Hcl     Legs twitch   Current Outpatient Prescriptions on File Prior to Visit  Medication Sig Dispense Refill  . aspirin 81 MG EC tablet Take 81 mg by mouth daily.       No current facility-administered medications on file prior to visit.   Review of Systems  Constitutional: Negative for unusual diaphoresis or night sweats HENT: Negative for ringing in ear or discharge Eyes: Negative for double vision or worsening visual disturbance.  Respiratory: Negative for choking and stridor.   Gastrointestinal: Negative for vomiting or other signifcant bowel change Genitourinary: Negative for hematuria or change in urine volume.  Musculoskeletal: Negative for other MSK pain or swelling Skin: Negative for color change and worsening wound.  Neurological: Negative for tremors and numbness other than noted  Psychiatric/Behavioral: Negative for decreased concentration or agitation other than above  Objective:   Physical Exam BP 128/72 mmHg  Pulse 90  Temp(Src) 98.2 F (36.8 C) (Oral)  Resp 20  Wt 186 lb (84.369 kg)  SpO2 97% VS noted,  Constitutional: Pt appears in no significant distress HENT: Head: NCAT.  Right Ear: External ear normal.  Left Ear: External ear normal.  Eyes: . Pupils are equal, round, and reactive to light. Conjunctivae and EOM are normal Neck: Normal range of motion. Neck supple.  Cardiovascular: Normal rate and regular rhythm.   Pulmonary/Chest: Effort normal and breath sounds  decresased without rales or wheezing.  Abd:  Soft, NT, ND, + BS Neurological: Pt is alert. Not confused , motor grossly intact Skin: Skin is warm. No rash, no LE edema, has 1/2 x 1/4 oval slight raised tan lesion left lower lumbar area Psychiatric: Pt behavior is normal. No agitation. 1+ nervous Left shoulder with subacromial tender and swelling, o/w FROm    Assessment & Plan:

## 2015-06-24 NOTE — Assessment & Plan Note (Signed)
Pt with hx of melanoma, and current lesion though not classic may be enlarging, per pt she will call herself prior derm for further evaluatoin

## 2015-06-24 NOTE — Assessment & Plan Note (Signed)
Lab Results  Component Value Date   CHOL 259* 08/21/2014   HDL 62.00 08/21/2014   LDLDIRECT 159.0 08/21/2014   TRIG 219.0* 08/21/2014   CHOLHDL 4 08/21/2014   With mod to severe LDL elevation recent, d/w pt - for lower chol diet, declines statin for now

## 2015-06-24 NOTE — Assessment & Plan Note (Signed)
Out of adderall for several months, not functioning as well at work, relying on sugar colas for stimulus but now with wt gain, for adderall refill, avoid sugar coloas, increase exercise and wt loss efforts

## 2015-07-10 ENCOUNTER — Other Ambulatory Visit (INDEPENDENT_AMBULATORY_CARE_PROVIDER_SITE_OTHER): Payer: Managed Care, Other (non HMO)

## 2015-07-10 ENCOUNTER — Encounter: Payer: Self-pay | Admitting: Family Medicine

## 2015-07-10 ENCOUNTER — Ambulatory Visit (INDEPENDENT_AMBULATORY_CARE_PROVIDER_SITE_OTHER): Payer: Managed Care, Other (non HMO) | Admitting: Family Medicine

## 2015-07-10 VITALS — BP 124/84 | HR 73 | Ht 66.0 in | Wt 182.0 lb

## 2015-07-10 DIAGNOSIS — M7530 Calcific tendinitis of unspecified shoulder: Secondary | ICD-10-CM | POA: Insufficient documentation

## 2015-07-10 DIAGNOSIS — M25512 Pain in left shoulder: Secondary | ICD-10-CM

## 2015-07-10 DIAGNOSIS — M6520 Calcific tendinitis, unspecified site: Secondary | ICD-10-CM | POA: Diagnosis not present

## 2015-07-10 MED ORDER — DICLOFENAC SODIUM 2 % TD SOLN: 2.0000 "application " | Freq: Two times a day (BID) | TRANSDERMAL | Status: DC

## 2015-07-10 NOTE — Assessment & Plan Note (Signed)
Patient does have more of a bursitis of the shoulder. Seems to be calcific which means it's been long-standing. We discussed vitamin D supplementation. Patient given topical anti-inflammatory's. Patient will try over-the-counter medications. We discussed icing regimen. Work with Product/process development scientist to learn home exercises. Patient come back in 3-4 weeks for further evaluation and treatment. June pain possible formal physical therapy and injection could be necessary.

## 2015-07-10 NOTE — Patient Instructions (Signed)
Good to see you.  Ice 20 minutes 2 times daily. Usually after activity and before bed. Exercises 3 times a week.  pennsaid pinkie amount topically 2 times daily as needed.  Vitamin D 4000 IU daily for 1 month then 2000IU daily thereafter Turmeric 500mg  twice daily  Try to keep hands within peripheral vision at all times with activity  See me again in 3-4 weeks.

## 2015-07-10 NOTE — Progress Notes (Signed)
[facility] [address] [place], [place] [address] Phone: [phone] Subjective:    I'm seeing this patient by the request  of:  Cathlean Cower, MD   CC: Left shoulder pain  [id number] Robin Farley is a 50 y.o. female coming in with complaint of left shoulder pain. Patient sates that this been going on multiple months. Scrubs the pain as a dull, throbbing aching sensation. Denies any numbness. Denies any radiation of pain. Seems to be localized. Worse at night. Rates the severity of 7 out of 10 because it does wake her up. Does not seem to be getting better. Still able to do daily activities. Does respond somewhat to ibuprofen.     Past Medical History  Diagnosis Date  . HYPERLIPIDEMIA 02/23/2007  . ANEMIA 02/05/2007  . DEPRESSION 02/23/2007  . ADD 03/13/2010  . Acute sinusitis, unspecified 02/26/2007  . Deviated nasal septum 02/05/2007  . ALLERGIC RHINITIS 02/23/2007  . GERD 12/27/2008  . UTI 02/23/2007  . FATIGUE 02/23/2007  . SWELLING MASS OR LUMP IN HEAD AND NECK 07/27/2008  . Abdominal pain, generalized 12/27/2008  . ATTENTION DEFICIT DISORDER, HX OF 02/05/2007  . Depression 02/18/2011  . Anxiety 11/17/2011   Past Surgical History  Procedure Laterality Date  . Nasal septum surgery    . Cesarean section    . Melanoma excision with sentinel lymph node biopsy Left 10/24/2012    Procedure: MELANOMA wide EXCISION left lateral thigh WITH SENTINEL LYMPH NODE BIOPSY left groin;  Surgeon: Edward Jolly, MD;  Location: [facility];  Service: General;  Laterality: Left;  . Brow lift     Social History   Social History  . Marital Status: Married    Spouse Name: N/A  . Number of Children: 2  . Years of Education: N/A   Occupational History  . master's in fine arts [employer]    Social History Main Topics  . Smoking status: Never Smoker   . Smokeless tobacco: Never Used  . Alcohol Use: 0.0 oz/week    0 Standard drinks or  equivalent per week     Comment: rare  . Drug Use: No  . Sexual Activity: Not Asked   Other Topics Concern  . None   Social History Narrative   Allergies  Allergen Reactions  . Promethazine Hcl     Legs twitch   Family History  Problem Relation Age of Onset  . Hypertension Mother   . COPD Father   . Atrial fibrillation Father   . Atrial fibrillation Sister   . Alcohol abuse Other   . Depression Other   . Glaucoma Other   . Colon polyps Neg Hx     Past medical history, social, surgical and family history all reviewed in electronic medical record.  No pertanent information unless stated regarding to the chief complaint.   Review of Systems: No headache, visual changes, nausea, vomiting, diarrhea, constipation, dizziness, abdominal pain, skin rash, fevers, chills, night sweats, weight loss, swollen lymph nodes, body aches, joint swelling, muscle aches, chest pain, shortness of breath, mood changes.   Objective Blood pressure 124/84, pulse 73, height 5\' 6"  (1.676 m), weight 182 lb (82.555 kg), SpO2 97 %.  General: No apparent distress alert and oriented x3 mood and affect normal, dressed appropriately.  HEENT: Pupils equal, extraocular movements intact  Respiratory: Patient's speak in full sentences and does not appear short of breath  Cardiovascular: No lower extremity edema, non tender, no erythema  Skin:  Warm dry intact with no signs of infection or rash on extremities or on axial skeleton.  Abdomen: Soft nontender  Neuro: Cranial nerves II through XII are intact, neurovascularly intact in all extremities with 2+ DTRs and 2+ pulses.  Lymph: No lymphadenopathy of posterior or anterior cervical chain or axillae bilaterally.  Gait normal with good balance and coordination.  MSK:  Non tender with full range of motion and good stability and symmetric strength and tone of  elbows, wrist, hip, knee and ankles bilaterally.  Shoulder: left Inspection reveals no abnormalities,  atrophy or asymmetry. Palpation is normal with no tenderness over AC joint or bicipital groove. ROM is full in all planes passively. Rotator cuff strength normal throughout. signs of impingement with positive Neer and Hawkin's tests, but negative empty can sign. Speeds and Yergason's tests normal. No labral pathology noted with negative Obrien's, negative clunk and good stability. Normal scapular function observed. No painful arc and no drop arm sign. No apprehension sign Contralateral shoulder unremarkable  MSK US performed of: left This study was ordered, performed, and interpreted by Charlann Boxer D.O.  Shoulder:   Supraspinatus:  Appears normal on long and transverse views, Bursal bulge seen with shoulder abduction on impingement view. Calcific changes noted Infraspinatus:  Appears normal on long and transverse views. Significant increase in Doppler flow Subscapularis:  Appears normal on long and transverse views. Positive bursa with calcific changes. Teres Minor:  Appears normal on long and transverse views. AC joint:  Mild distention but no arthritis Glenohumeral Joint:  Appears normal without effusion. Glenoid Labrum:  Intact without visualized tears. Biceps Tendon:  Appears normal on long and transverse views, no fraying of tendon, tendon located in intertubercular groove, no subluxation with shoulder internal or external rotation.  Impression: Calcific subacromial bursitis  Procedure note [id number]; 15 minutes spent for Therapeutic exercises as stated in above notes.  This included exercises focusing on stretching, strengthening, with significant focus on eccentric aspects.  Basic scapular stabilization to include adduction and depression of scapula Scaption, focusing on proper movement and good control Internal and External rotation utilizing a theraband, with elbow tucked at side entire time Rows with theraband Proper technique shown and discussed handout in great detail with ATC.   All questions were discussed and answered.       Impression and Recommendations:     This case required medical decision making of moderate complexity.      Note: This dictation was prepared with Dragon dictation along with smaller phrase technology. Any transcriptional errors that result from this process are unintentional.

## 2015-07-10 NOTE — Progress Notes (Signed)
Pre visit review using our clinic review tool, if applicable. No additional management support is needed unless otherwise documented below in the visit note. 

## 2015-08-01 ENCOUNTER — Ambulatory Visit (INDEPENDENT_AMBULATORY_CARE_PROVIDER_SITE_OTHER): Payer: Managed Care, Other (non HMO) | Admitting: Family Medicine

## 2015-08-01 ENCOUNTER — Other Ambulatory Visit (INDEPENDENT_AMBULATORY_CARE_PROVIDER_SITE_OTHER): Payer: Managed Care, Other (non HMO)

## 2015-08-01 ENCOUNTER — Encounter: Payer: Self-pay | Admitting: Family Medicine

## 2015-08-01 VITALS — BP 116/74 | HR 75 | Ht 66.0 in | Wt 185.0 lb

## 2015-08-01 DIAGNOSIS — M25512 Pain in left shoulder: Secondary | ICD-10-CM

## 2015-08-01 DIAGNOSIS — M7530 Calcific tendinitis of unspecified shoulder: Secondary | ICD-10-CM

## 2015-08-01 DIAGNOSIS — M6520 Calcific tendinitis, unspecified site: Secondary | ICD-10-CM

## 2015-08-01 NOTE — Patient Instructions (Addendum)
Great to see you  Robin Farley is still your friend Duexis 3 times daily for next 3 day s Physical therapy will be calling you  Continue the vitamins Try to keep hands within peripheral visoin with any lifting.  See me again in 4 weeks and we will make sure you are almost all the way better.

## 2015-08-01 NOTE — Progress Notes (Signed)
Pre visit review using our clinic review tool, if applicable. No additional management support is needed unless otherwise documented below in the visit note. 

## 2015-08-01 NOTE — Progress Notes (Signed)
[facility] [address] [place], [place] [address] Phone: [phone] Subjective:    I'm seeing this patient by the request  of:  Cathlean Cower, MD   CC: Left shoulder pain  [id number] Robin Farley is a 50 y.o. female coming in with complaint of left shoulder pain. Patient was seen previously and was diagnosed with a calcific bursitis.patient states that she has been doing the exercises intermittently but has been using the cream and icing. States that she is only 10% better. Patient states that it still waking her up at night. Still affecting some daily activities. Noticing mild limitation in range of motion but she thinks it secondary to her compensating.     Past Medical History  Diagnosis Date  . HYPERLIPIDEMIA 02/23/2007  . ANEMIA 02/05/2007  . DEPRESSION 02/23/2007  . ADD 03/13/2010  . Acute sinusitis, unspecified 02/26/2007  . Deviated nasal septum 02/05/2007  . ALLERGIC RHINITIS 02/23/2007  . GERD 12/27/2008  . UTI 02/23/2007  . FATIGUE 02/23/2007  . SWELLING MASS OR LUMP IN HEAD AND NECK 07/27/2008  . Abdominal pain, generalized 12/27/2008  . ATTENTION DEFICIT DISORDER, HX OF 02/05/2007  . Depression 02/18/2011  . Anxiety 11/17/2011   Past Surgical History  Procedure Laterality Date  . Nasal septum surgery    . Cesarean section    . Melanoma excision with sentinel lymph node biopsy Left 10/24/2012    Procedure: MELANOMA wide EXCISION left lateral thigh WITH SENTINEL LYMPH NODE BIOPSY left groin;  Surgeon: Edward Jolly, MD;  Location: [facility];  Service: General;  Laterality: Left;  . Brow lift     Social History   Social History  . Marital Status: Married    Spouse Name: N/A  . Number of Children: 2  . Years of Education: N/A   Occupational History  . master's in fine arts [employer]    Social History Main Topics  . Smoking status: Never Smoker   . Smokeless tobacco: Never Used  . Alcohol Use: 0.0 oz/week    0  Standard drinks or equivalent per week     Comment: rare  . Drug Use: No  . Sexual Activity: Not Asked   Other Topics Concern  . None   Social History Narrative   Allergies  Allergen Reactions  . Promethazine Hcl     Legs twitch   Family History  Problem Relation Age of Onset  . Hypertension Mother   . COPD Father   . Atrial fibrillation Father   . Atrial fibrillation Sister   . Alcohol abuse Other   . Depression Other   . Glaucoma Other   . Colon polyps Neg Hx     Past medical history, social, surgical and family history all reviewed in electronic medical record.  No pertanent information unless stated regarding to the chief complaint.   Review of Systems: No headache, visual changes, nausea, vomiting, diarrhea, constipation, dizziness, abdominal pain, skin rash, fevers, chills, night sweats, weight loss, swollen lymph nodes, body aches, joint swelling, muscle aches, chest pain, shortness of breath, mood changes.   Objective Blood pressure 116/74, pulse 75, height 5\' 6"  (1.676 m), weight 185 lb (83.915 kg), SpO2 95 %.  General: No apparent distress alert and oriented x3 mood and affect normal, dressed appropriately.  HEENT: Pupils equal, extraocular movements intact  Respiratory: Patient's speak in full sentences and does not appear short of breath  Cardiovascular: No lower extremity edema, non tender, no erythema  Skin:  Warm dry intact with no signs of infection or rash on extremities or on axial skeleton.  Abdomen: Soft nontender  Neuro: Cranial nerves II through XII are intact, neurovascularly intact in all extremities with 2+ DTRs and 2+ pulses.  Lymph: No lymphadenopathy of posterior or anterior cervical chain or axillae bilaterally.  Gait normal with good balance and coordination.  MSK:  Non tender with full range of motion and good stability and symmetric strength and tone of  elbows, wrist, hip, knee and ankles bilaterally.  Shoulder: left Inspection reveals no  abnormalities, atrophy or asymmetry. Palpation is normal with no tenderness over AC joint or bicipital groove. Patient has internal rotation to sacrum this is worse than previous exam Rotator cuff strength normal throughout. signs of impingement with positive Neer and Hawkin's tests, but negative empty can sign. Speeds and Yergason's tests normal. Positive O'Brien's Normal scapular function observed. No painful arc and no drop arm sign. No apprehension sign Contralateral shoulder unremarkable  MSK US performed of: left This study was ordered, performed, and interpreted by Charlann Boxer D.O.  Shoulder:   Supraspinatus:  Appears normal on long and transverse views, Bursal bulge seen with shoulder abduction on impingement view. Calcific changes notedand may be worse than previous examInfraspinatus:  Appears normal on long and transverse views. Significant increase in Doppler flow Subscapularis:  Appears normal on long and transverse views. Positive bursa with calcific changes. Teres Minor:  Appears normal on long and transverse views. AC joint:  Mild distention but no arthritis Glenohumeral Joint:  Appears normal without effusion. Glenoid Labrum:  Trace effusion noted but no true acute tear Biceps Tendon:  Appears normal on long and transverse views, no fraying of tendon, tendon located in intertubercular groove, no subluxation with shoulder internal or external rotation.  Impression: Calcific subacromial bursitis  Procedure: Real-time Ultrasound Guided Injection of left glenohumeral joint Device: GE Logiq E  Ultrasound guided injection is preferred based studies that show increased duration, increased effect, greater accuracy, decreased procedural pain, increased response rate with ultrasound guided versus blind injection.  Verbal informed consent obtained.  Time-out conducted.  Noted no overlying erythema, induration, or other signs of local infection.  Skin prepped in a sterile fashion.    Local anesthesia: Topical Ethyl chloride.  With sterile technique and under real time ultrasound guidance:  Joint visualized.  23g 1  inch needle inserted posterior approach. Pictures taken for needle placement. Patient did have injection of 2 cc of 1% lidocaine, 2 cc of 0.5% Marcaine, and 1cc of Kenalog 40 mg/dL. Completed without difficulty  Pain immediately resolved suggesting accurate placement of the medication.  Advised to call if fevers/chills, erythema, induration, drainage, or persistent bleeding.  Images permanently stored and available for review in the ultrasound unit.  Impression: Technically successful ultrasound guided injection.       Impression and Recommendations:     This case required medical decision making of moderate complexity.      Note: This dictation was prepared with Dragon dictation along with smaller phrase technology. Any transcriptional errors that result from this process are unintentional.

## 2015-08-01 NOTE — Assessment & Plan Note (Signed)
Patient does have worsening symptoms.Patient was given an injection today. Sent to formal physical therapy that I think will be beneficial. We discussed icing regimen and home exercises. We discussed which activities to do in which ones to avoid. Patient will continue with the topical anti-inflammatories. Short course of oral anti-inflammatories as well. Follow up in 4 weeks.  Spent  25 minutes with patient face-to-face and had greater than 50% of counseling including as described above in assessment and plan.

## 2015-08-26 ENCOUNTER — Encounter: Payer: Self-pay | Admitting: Family Medicine

## 2015-08-29 ENCOUNTER — Encounter: Payer: Self-pay | Admitting: Family Medicine

## 2015-08-29 ENCOUNTER — Ambulatory Visit (INDEPENDENT_AMBULATORY_CARE_PROVIDER_SITE_OTHER): Payer: Managed Care, Other (non HMO) | Admitting: Family Medicine

## 2015-08-29 VITALS — BP 114/82 | HR 82 | Ht 66.0 in | Wt 187.0 lb

## 2015-08-29 DIAGNOSIS — M6520 Calcific tendinitis, unspecified site: Secondary | ICD-10-CM | POA: Diagnosis not present

## 2015-08-29 DIAGNOSIS — M7530 Calcific tendinitis of unspecified shoulder: Secondary | ICD-10-CM

## 2015-08-29 MED ORDER — NITROGLYCERIN 0.2 MG/HR TD PT24
MEDICATED_PATCH | TRANSDERMAL | Status: DC
Start: 2015-08-29 — End: 2015-10-10

## 2015-08-29 NOTE — Patient Instructions (Addendum)
Good to see you  I am sorry for your loss.  Ice is your friend Call PT and get going.  Nitroglycerin Protocol   Apply 1/4 nitroglycerin patch to affected area daily.  Change position of patch within the affected area every 24 hours.  You may experience a headache during the first 1-2 weeks of using the patch, these should subside.  If you experience headaches after beginning nitroglycerin patch treatment, you may take your preferred over the counter pain reliever.  Another side effect of the nitroglycerin patch is skin irritation or rash related to patch adhesive.  Please notify our office if you develop more severe headaches or rash, and stop the patch.  Tendon healing with nitroglycerin patch may require 12 to 24 weeks depending on the extent of injury.  Men should not use if taking Viagra, Cialis, or Levitra.   Do not use if you have migraines or rosacea.  See me again in 4-6 weeks.

## 2015-08-29 NOTE — Assessment & Plan Note (Signed)
Discussed with patient at great length. We discussed icing regimen and home exercises. Patient will start range of motion patches that were prescribed today. Patient will call formal physical therapy and start this process. We discussed the next step would be an MRI and likely an MR arthrogram to rule out a labral tear that would not be seen on ultrasound. Patient though knows if we do this and there is a finding that is positive likely surgical intervention would be needed. Patient wants to avoid this. Patient will come back and see me again in 4-6 weeks initiation she is responding.

## 2015-08-29 NOTE — Progress Notes (Signed)
[facility] [address], [place] [address] Phone: [phone] Subjective:    I'm seeing this patient by the request  of:  Cathlean Cower, MD   CC: Left shoulder pain f/u [id number] Robin Farley is a 50 y.o. female coming in with complaint of left shoulder pain. Patient was seen previously and was diagnosed with a calcific bursitis. Patient was seen 4 weeks ago and was given an injection in her shoulder. Was feeling fairly again but stopped doing the exercises because she was out of town and unfortunately her grandmother past. Patient states since then she has not been able to do the exercises regularly and has not started with formal physical therapy. Feels that the improvement has plateaued. Patient states that is still giving her some pain mostly at night. Discussed this until aching sensation. Not as bad as what it was and would state that overall she is about 25% better since the injection.    Past Medical History  Diagnosis Date  . HYPERLIPIDEMIA 02/23/2007  . ANEMIA 02/05/2007  . DEPRESSION 02/23/2007  . ADD 03/13/2010  . Acute sinusitis, unspecified 02/26/2007  . Deviated nasal septum 02/05/2007  . ALLERGIC RHINITIS 02/23/2007  . GERD 12/27/2008  . UTI 02/23/2007  . FATIGUE 02/23/2007  . SWELLING MASS OR LUMP IN HEAD AND NECK 07/27/2008  . Abdominal pain, generalized 12/27/2008  . ATTENTION DEFICIT DISORDER, HX OF 02/05/2007  . Depression 02/18/2011  . Anxiety 11/17/2011   Past Surgical History  Procedure Laterality Date  . Nasal septum surgery    . Cesarean section    . Melanoma excision with sentinel lymph node biopsy Left 10/24/2012    Procedure: MELANOMA wide EXCISION left lateral thigh WITH SENTINEL LYMPH NODE BIOPSY left groin;  Surgeon: Edward Jolly, MD;  Location: [facility];  Service: General;  Laterality: Left;  . Brow lift     Social History   Social History  . Marital Status: Married    Spouse Name:  N/A  . Number of Children: 2  . Years of Education: N/A   Occupational History  . master's in fine arts [employer]    Social History Main Topics  . Smoking status: Never Smoker   . Smokeless tobacco: Never Used  . Alcohol Use: 0.0 oz/week    0 Standard drinks or equivalent per week     Comment: rare  . Drug Use: No  . Sexual Activity: Not Asked   Other Topics Concern  . None   Social History Narrative   Allergies  Allergen Reactions  . Promethazine Hcl     Legs twitch   Family History  Problem Relation Age of Onset  . Hypertension Mother   . COPD Father   . Atrial fibrillation Father   . Atrial fibrillation Sister   . Alcohol abuse Other   . Depression Other   . Glaucoma Other   . Colon polyps Neg Hx     Past medical history, social, surgical and family history all reviewed in electronic medical record.  No pertanent information unless stated regarding to the chief complaint.   Review of Systems: No headache, visual changes, nausea, vomiting, diarrhea, constipation, dizziness, abdominal pain, skin rash, fevers, chills, night sweats, weight loss, swollen lymph nodes, body aches, joint swelling, muscle aches, chest pain, shortness of breath, mood changes.   Objective Blood pressure 114/82, pulse 82, height 5\' 6"  (1.676 m), weight 187 lb (84.823 kg), SpO2 99 %.  General: No apparent  distress alert and oriented x3 mood and affect normal, dressed appropriately.  HEENT: Pupils equal, extraocular movements intact  Respiratory: Patient's speak in full sentences and does not appear short of breath  Cardiovascular: No lower extremity edema, non tender, no erythema  Skin: Warm dry intact with no signs of infection or rash on extremities or on axial skeleton.  Abdomen: Soft nontender  Neuro: Cranial nerves II through XII are intact, neurovascularly intact in all extremities with 2+ DTRs and 2+ pulses.  Lymph: No lymphadenopathy of posterior or anterior cervical chain or axillae  bilaterally.  Gait normal with good balance and coordination.  MSK:  Non tender with full range of motion and good stability and symmetric strength and tone of  elbows, wrist, hip, knee and ankles bilaterally.  Shoulder: left Inspection reveals no abnormalities, atrophy or asymmetry. Palpation is normal with no tenderness over AC joint or bicipital groove. Patient does have improvement in range of motion but still has discomfort with internal range of motion. Rotator cuff strength normal throughout. signs of impingement with positive Neer and Hawkin's tests, but negative empty can sign. Speeds and Yergason's tests normal. Positive O'Brien'sstill present Normal scapular function observed. No painful arc and no drop arm sign. No apprehension sign Contralateral shoulder unremarkable         Impression and Recommendations:     This case required medical decision making of moderate complexity.      Note: This dictation was prepared with Dragon dictation along with smaller phrase technology. Any transcriptional errors that result from this process are unintentional.

## 2015-08-29 NOTE — Progress Notes (Signed)
Pre visit review using our clinic review tool, if applicable. No additional management support is needed unless otherwise documented below in the visit note. 

## 2015-08-30 ENCOUNTER — Telehealth: Payer: Self-pay | Admitting: *Deleted

## 2015-08-30 NOTE — Telephone Encounter (Signed)
Left msg on triage stating received script that was sent in yesterday for pt Nitroglycerin patches. Instructions was to take 1/4 of patch. Patches are not to be cut. Requesting MD advisement.Marland KitchenJohny Chess

## 2015-08-31 NOTE — Telephone Encounter (Signed)
It is fine to cut.   I know what they say and it is for if you were using it for heart.  This is different. Use 1/4 patch

## 2015-09-02 NOTE — Telephone Encounter (Signed)
Called pharmacy spoke w/phelton gave md response...Robin Farley

## 2015-10-10 ENCOUNTER — Ambulatory Visit (INDEPENDENT_AMBULATORY_CARE_PROVIDER_SITE_OTHER): Payer: Managed Care, Other (non HMO) | Admitting: Family Medicine

## 2015-10-10 ENCOUNTER — Telehealth: Payer: Self-pay | Admitting: Family Medicine

## 2015-10-10 ENCOUNTER — Encounter: Payer: Self-pay | Admitting: Family Medicine

## 2015-10-10 ENCOUNTER — Ambulatory Visit: Payer: Managed Care, Other (non HMO) | Admitting: Internal Medicine

## 2015-10-10 VITALS — BP 134/84 | HR 84 | Ht 66.0 in | Wt 177.0 lb

## 2015-10-10 DIAGNOSIS — M6520 Calcific tendinitis, unspecified site: Secondary | ICD-10-CM | POA: Diagnosis not present

## 2015-10-10 DIAGNOSIS — M7530 Calcific tendinitis of unspecified shoulder: Secondary | ICD-10-CM

## 2015-10-10 NOTE — Patient Instructions (Signed)
Good to see you  Robin Farley is your friend.  \Finish with PT Stay active and try to do the exercises 2-3 times a week.  OK to start lifting a little but keep hands within peripheral visoin See me again in 6-8 weeks.

## 2015-10-10 NOTE — Progress Notes (Signed)
[facility] [address] [place], [place] [address] Phone: [phone] Subjective:    I'm seeing this patient by the request  of:  Cathlean Cower, MD   CC: Left shoulder pain f/u [id number] Robin Farley is a 50 y.o. female coming in with complaint of left shoulder pain. Patient was seen previously and was diagnosed with a calcific bursitis. Given injection 2 months ago.: Making some mild improvement. Sent to formal physical therapy and started on nitroglycerin patches. Patient was to continue all other conservative therapy. Patient states overall she continues to improve. Patient states that the skull therapy has been very beneficial. Denies any new symptoms such as radiation of the pain. Unable to tolerate the nitroglycerin patches secondary to headache. Patient states though that she is about 50-60% better and seems to be improving weekly. Has 2 more weeks of physical therapy left. Has not taken any pain medications or any anti-inflammatories regularly.    Past Medical History  Diagnosis Date  . HYPERLIPIDEMIA 02/23/2007  . ANEMIA 02/05/2007  . DEPRESSION 02/23/2007  . ADD 03/13/2010  . Acute sinusitis, unspecified 02/26/2007  . Deviated nasal septum 02/05/2007  . ALLERGIC RHINITIS 02/23/2007  . GERD 12/27/2008  . UTI 02/23/2007  . FATIGUE 02/23/2007  . SWELLING MASS OR LUMP IN HEAD AND NECK 07/27/2008  . Abdominal pain, generalized 12/27/2008  . ATTENTION DEFICIT DISORDER, HX OF 02/05/2007  . Depression 02/18/2011  . Anxiety 11/17/2011   Past Surgical History  Procedure Laterality Date  . Nasal septum surgery    . Cesarean section    . Melanoma excision with sentinel lymph node biopsy Left 10/24/2012    Procedure: MELANOMA wide EXCISION left lateral thigh WITH SENTINEL LYMPH NODE BIOPSY left groin;  Surgeon: Edward Jolly, MD;  Location: [facility];  Service: General;  Laterality: Left;  . Brow lift     Social History   Social  History  . Marital Status: Married    Spouse Name: N/A  . Number of Children: 2  . Years of Education: N/A   Occupational History  . master's in fine arts [employer]    Social History Main Topics  . Smoking status: Never Smoker   . Smokeless tobacco: Never Used  . Alcohol Use: 0.0 oz/week    0 Standard drinks or equivalent per week     Comment: rare  . Drug Use: No  . Sexual Activity: Not on file   Other Topics Concern  . Not on file   Social History Narrative   Allergies  Allergen Reactions  . Promethazine Hcl     Legs twitch   Family History  Problem Relation Age of Onset  . Hypertension Mother   . COPD Father   . Atrial fibrillation Father   . Atrial fibrillation Sister   . Alcohol abuse Other   . Depression Other   . Glaucoma Other   . Colon polyps Neg Hx     Past medical history, social, surgical and family history all reviewed in electronic medical record.  No pertanent information unless stated regarding to the chief complaint.   Review of Systems: No headache, visual changes, nausea, vomiting, diarrhea, constipation, dizziness, abdominal pain, skin rash, fevers, chills, night sweats, weight loss, swollen lymph nodes, body aches, joint swelling, muscle aches, chest pain, shortness of breath, mood changes.   Objective There were no vitals taken for this visit.  General: No apparent distress alert and oriented x3 mood and affect normal, dressed  appropriately.  HEENT: Pupils equal, extraocular movements intact  Respiratory: Patient's speak in full sentences and does not appear short of breath  Cardiovascular: No lower extremity edema, non tender, no erythema  Skin: Warm dry intact with no signs of infection or rash on extremities or on axial skeleton.  Abdomen: Soft nontender  Neuro: Cranial nerves II through XII are intact, neurovascularly intact in all extremities with 2+ DTRs and 2+ pulses.  Lymph: No lymphadenopathy of posterior or anterior cervical chain or  axillae bilaterally.  Gait normal with good balance and coordination.  MSK:  Non tender with full range of motion and good stability and symmetric strength and tone of  elbows, wrist, hip, knee and ankles bilaterally.  Shoulder: left Inspection reveals no abnormalities, atrophy or asymmetry. Palpation is normal with no tenderness over AC joint or bicipital groove. Full ROM noted today with minimal impingement noted.  Rotator cuff strength normal throughout. Speeds and Yergason's tests normal. Positive O'Brien'sstill present Normal scapular function observed. No painful arc and no drop arm sign. No apprehension sign Contralateral shoulder unremarkable     Impression and Recommendations:     This case required medical decision making of moderate complexity.      Note: This dictation was prepared with Dragon dictation along with smaller phrase technology. Any transcriptional errors that result from this process are unintentional.

## 2015-10-10 NOTE — Telephone Encounter (Signed)
Noted  

## 2015-10-10 NOTE — Progress Notes (Signed)
Pre visit review using our clinic review tool, if applicable. No additional management support is needed unless otherwise documented below in the visit note. 

## 2015-10-10 NOTE — Assessment & Plan Note (Signed)
Patient is making progress at this time. Encourage her to continue with conservative therapy. We discussed icing regimen. Discussed finishing with formal physical therapy. Unable to tolerate the nitroglycerin patches. Patient come back and see me again in 6-8 weeks and at that point she will be near completely resolved.

## 2015-10-10 NOTE — Telephone Encounter (Signed)
Patient came in this morning for appointment scheduled with Dr. Jenny Reichmann.  Should have been with Tamala Julian.  I told patient to come back today between 12:45 and 1pm.

## 2015-12-10 NOTE — Progress Notes (Signed)
[facility] [address] [place], [place] [address] Phone: [phone] Subjective:    I'm seeing this patient by the request  of:  Cathlean Cower, MD   CC: Left shoulder pain f/u [id number]  Tassia Meder is a 50 y.o. female coming in with complaint of left shoulder pain. Seen 2 months ago. Was making progress and increasing range of motion. Has finished physical therapy. Continue doing home exercises. Patient states The pain is significantly better. Patient has noticed though decreasing range of motion. Patient feels that at this moment that she would not be able to tolerate this and is wondering what is the next that. Happy that the pain has resolved but states that certain things like lifting even hurt With the left hand can be difficult.    Past Medical History:  Diagnosis Date  . Abdominal pain, generalized 12/27/2008  . Acute sinusitis, unspecified 02/26/2007  . ADD 03/13/2010  . ALLERGIC RHINITIS 02/23/2007  . ANEMIA 02/05/2007  . Anxiety 11/17/2011  . ATTENTION DEFICIT DISORDER, HX OF 02/05/2007  . DEPRESSION 02/23/2007  . Depression 02/18/2011  . Deviated nasal septum 02/05/2007  . FATIGUE 02/23/2007  . GERD 12/27/2008  . HYPERLIPIDEMIA 02/23/2007  . SWELLING MASS OR LUMP IN HEAD AND NECK 07/27/2008  . UTI 02/23/2007   Past Surgical History:  Procedure Laterality Date  . BROW LIFT    . CESAREAN SECTION    . MELANOMA EXCISION WITH SENTINEL LYMPH NODE BIOPSY Left 10/24/2012   Procedure: MELANOMA wide EXCISION left lateral thigh WITH SENTINEL LYMPH NODE BIOPSY left groin;  Surgeon: Edward Jolly, MD;  Location: [facility];  Service: General;  Laterality: Left;  . NASAL SEPTUM SURGERY     Social History   Social History  . Marital status: Married    Spouse name: N/A  . Number of children: 2  . Years of education: N/A   Occupational History  . master's in fine arts [employer]   Social History Main  Topics  . Smoking status: Never Smoker  . Smokeless tobacco: Never Used  . Alcohol use 0.0 oz/week     Comment: rare  . Drug use: No  . Sexual activity: Not Asked   Other Topics Concern  . None   Social History Narrative  . None   Allergies  Allergen Reactions  . Promethazine Hcl     Legs twitch   Family History  Problem Relation Age of Onset  . Hypertension Mother   . COPD Father   . Atrial fibrillation Father   . Atrial fibrillation Sister   . Alcohol abuse Other   . Depression Other   . Glaucoma Other   . Colon polyps Neg Hx     Past medical history, social, surgical and family history all reviewed in electronic medical record.  No pertanent information unless stated regarding to the chief complaint.   Review of Systems: No headache, visual changes, nausea, vomiting, diarrhea, constipation, dizziness, abdominal pain, skin rash, fevers, chills, night sweats, weight loss, swollen lymph nodes, body aches, joint swelling, muscle aches, chest pain, shortness of breath, mood changes.   Objective  Blood pressure 124/84, pulse 75, weight 177 lb (80.3 kg), last menstrual period 11/26/2015, SpO2 97 %.  General: No apparent distress alert and oriented x3 mood and affect normal, dressed appropriately.  HEENT: Pupils equal, extraocular movements intact  Respiratory: Patient's speak in full sentences and does not appear short of breath  Cardiovascular: No lower  extremity edema, non tender, no erythema  Skin: Warm dry intact with no signs of infection or rash on extremities or on axial skeleton.  Abdomen: Soft nontender  Neuro: Cranial nerves II through XII are intact, neurovascularly intact in all extremities with 2+ DTRs and 2+ pulses.  Lymph: No lymphadenopathy of posterior or anterior cervical chain or axillae bilaterally.  Gait normal with good balance and coordination.  MSK:  Non tender with full range of motion and good stability and symmetric strength and tone of  elbows,  wrist, hip, knee and ankles bilaterally.  Shoulder: left Inspection reveals no abnormalities, atrophy or asymmetry. Palpation is normal with no tenderness over AC joint or bicipital groove. Full ROM noted today with minimal impingement noted.  Significant weakness of 3 out of 5 strength compared to previous exam. Speeds and Yergason's tests positive Positive O'Brien'sstill present Normal scapular function observed. Positive drop arm No apprehension sign Contralateral shoulder unremarkable     Impression and Recommendations:     This case required medical decision making of moderate complexity.      Note: This dictation was prepared with Dragon dictation along with smaller phrase technology. Any transcriptional errors that result from this process are unintentional.

## 2015-12-11 ENCOUNTER — Ambulatory Visit (INDEPENDENT_AMBULATORY_CARE_PROVIDER_SITE_OTHER): Payer: Managed Care, Other (non HMO) | Admitting: Family Medicine

## 2015-12-11 ENCOUNTER — Ambulatory Visit (INDEPENDENT_AMBULATORY_CARE_PROVIDER_SITE_OTHER)
Admission: RE | Admit: 2015-12-11 | Discharge: 2015-12-11 | Disposition: A | Discharge status: Home / Self Care | Payer: Managed Care, Other (non HMO) | Source: Ambulatory Visit | Attending: Family Medicine | Admitting: Family Medicine

## 2015-12-11 ENCOUNTER — Encounter: Payer: Self-pay | Admitting: Family Medicine

## 2015-12-11 VITALS — BP 124/84 | HR 75 | Wt 177.0 lb

## 2015-12-11 DIAGNOSIS — M6520 Calcific tendinitis, unspecified site: Secondary | ICD-10-CM

## 2015-12-11 DIAGNOSIS — M25512 Pain in left shoulder: Secondary | ICD-10-CM | POA: Diagnosis not present

## 2015-12-11 DIAGNOSIS — M7530 Calcific tendinitis of unspecified shoulder: Secondary | ICD-10-CM

## 2015-12-11 NOTE — Patient Instructions (Signed)
I am sorry for the bad news but I think you now have a tear in the rotator cuff.  I would like to get an xray today downstairs We will order an MRI as well and then I will write you when I get the results.  If it is torn I likely will refer you to surgery.

## 2015-12-11 NOTE — Assessment & Plan Note (Signed)
Patient was found previously to have a calcific bursitis that seem to be resolving at last exam. Unfortunate patient does have significant weakness now. Concern for a possible rotator cuff tear. I believe the patient has tried very hard and tried all conservative therapy including physical therapy. X-rays ordered today as well as MRI. I do feel that patient's because of her young age and being very active forward do well with a rotator cuff surgery. We will wait to discuss after further imaging.

## 2015-12-11 NOTE — Assessment & Plan Note (Signed)
I believe that a calcific bursitis is gone but unfortunately hasn't a rotator cuff tear.

## 2015-12-18 ENCOUNTER — Ambulatory Visit
Admission: RE | Admit: 2015-12-18 | Discharge: 2015-12-18 | Disposition: A | Discharge status: Home / Self Care | Payer: Managed Care, Other (non HMO) | Source: Ambulatory Visit | Attending: Family Medicine | Admitting: Family Medicine

## 2015-12-18 DIAGNOSIS — M25512 Pain in left shoulder: Secondary | ICD-10-CM

## 2015-12-31 ENCOUNTER — Other Ambulatory Visit: Payer: Self-pay | Admitting: *Deleted

## 2015-12-31 DIAGNOSIS — M25512 Pain in left shoulder: Secondary | ICD-10-CM

## 2016-07-06 ENCOUNTER — Other Ambulatory Visit: Payer: Self-pay | Admitting: *Deleted

## 2016-07-06 NOTE — Telephone Encounter (Signed)
Rec'd call pt requesting refill on her Adderrall. Inform pt per chart she is overdue for appt will have to see MD for refills. Made appt for 07/09/16 @ 3:30...Johny Chess

## 2016-07-09 ENCOUNTER — Encounter: Payer: Self-pay | Admitting: Internal Medicine

## 2016-07-09 ENCOUNTER — Ambulatory Visit (INDEPENDENT_AMBULATORY_CARE_PROVIDER_SITE_OTHER): Payer: Managed Care, Other (non HMO) | Admitting: Internal Medicine

## 2016-07-09 ENCOUNTER — Other Ambulatory Visit (INDEPENDENT_AMBULATORY_CARE_PROVIDER_SITE_OTHER): Payer: Managed Care, Other (non HMO)

## 2016-07-09 VITALS — BP 122/80 | HR 71 | Temp 98.0°F | Ht 66.0 in | Wt 163.0 lb

## 2016-07-09 DIAGNOSIS — L719 Rosacea, unspecified: Secondary | ICD-10-CM

## 2016-07-09 DIAGNOSIS — M7530 Calcific tendinitis of unspecified shoulder: Secondary | ICD-10-CM

## 2016-07-09 DIAGNOSIS — Z0000 Encounter for general adult medical examination without abnormal findings: Secondary | ICD-10-CM

## 2016-07-09 DIAGNOSIS — Z1159 Encounter for screening for other viral diseases: Secondary | ICD-10-CM

## 2016-07-09 DIAGNOSIS — F988 Other specified behavioral and emotional disorders with onset usually occurring in childhood and adolescence: Secondary | ICD-10-CM | POA: Diagnosis not present

## 2016-07-09 LAB — URINALYSIS, ROUTINE W REFLEX MICROSCOPIC
Bilirubin Urine: NEGATIVE
HGB URINE DIPSTICK: NEGATIVE
Ketones, ur: NEGATIVE
Nitrite: NEGATIVE
Specific Gravity, Urine: 1.02 (ref 1.000–1.030)
Total Protein, Urine: NEGATIVE
Urine Glucose: NEGATIVE
Urobilinogen, UA: 0.2 (ref 0.0–1.0)
pH: 6 (ref 5.0–8.0)

## 2016-07-09 LAB — BASIC METABOLIC PANEL
BUN: 14 mg/dL (ref 6–23)
CALCIUM: 9.7 mg/dL (ref 8.4–10.5)
CO2: 28 meq/L (ref 19–32)
Chloride: 105 mEq/L (ref 96–112)
Creatinine, Ser: 0.79 mg/dL (ref 0.40–1.20)
GFR: 80.93 mL/min (ref >60.00)
GLUCOSE: 93 mg/dL (ref 70–99)
Potassium: 4.1 mEq/L (ref 3.5–5.1)
SODIUM: 139 meq/L (ref 135–145)

## 2016-07-09 LAB — HEPATIC FUNCTION PANEL
ALBUMIN: 4.3 g/dL (ref 3.5–5.2)
ALK PHOS: 55 U/L (ref 39–117)
ALT: 14 U/L (ref 0–35)
AST: 14 U/L (ref 0–37)
Bilirubin, Direct: 0.1 mg/dL (ref 0.0–0.3)
TOTAL PROTEIN: 7.1 g/dL (ref 6.0–8.3)
Total Bilirubin: 0.3 mg/dL (ref 0.2–1.2)

## 2016-07-09 LAB — CBC WITH DIFFERENTIAL/PLATELET
BASOS ABS: 0 10*3/uL (ref 0.0–0.1)
Basophils Relative: 0.4 % (ref 0.0–3.0)
Eosinophils Absolute: 0.1 10*3/uL (ref 0.0–0.7)
Eosinophils Relative: 2 % (ref 0.0–5.0)
HEMATOCRIT: 41.5 % (ref 36.0–46.0)
Hemoglobin: 13.9 g/dL (ref 12.0–15.0)
LYMPHS ABS: 1.9 10*3/uL (ref 0.7–4.0)
Lymphocytes Relative: 32.1 % (ref 12.0–46.0)
MCHC: 33.5 g/dL (ref 30.0–36.0)
MCV: 90.6 fl (ref 78.0–100.0)
MONO ABS: 0.5 10*3/uL (ref 0.1–1.0)
Monocytes Relative: 7.6 % (ref 3.0–12.0)
NEUTROS PCT: 57.9 % (ref 43.0–77.0)
Neutro Abs: 3.5 10*3/uL (ref 1.4–7.7)
PLATELETS: 246 10*3/uL (ref 150.0–400.0)
RBC: 4.58 Mil/uL (ref 3.87–5.11)
RDW: 13 % (ref 11.5–15.5)
WBC: 6 10*3/uL (ref 4.0–10.5)

## 2016-07-09 LAB — LIPID PANEL
CHOLESTEROL: 263 mg/dL — AB (ref 0–200)
HDL: 67.5 mg/dL (ref >39.00)
LDL Cholesterol: 164 mg/dL — ABNORMAL HIGH (ref 0–99)
NonHDL: 195.57
TRIGLYCERIDES: 159 mg/dL — AB (ref 0.0–149.0)
Total CHOL/HDL Ratio: 4
VLDL: 31.8 mg/dL (ref 0.0–40.0)

## 2016-07-09 LAB — TSH: TSH: 1.95 u[IU]/mL (ref 0.35–4.50)

## 2016-07-09 LAB — HIV ANTIBODY (ROUTINE TESTING W REFLEX): HIV 1&2 Ab, 4th Generation: NONREACTIVE

## 2016-07-09 MED ORDER — AMPHETAMINE-DEXTROAMPHETAMINE 20 MG PO TABS: ORAL_TABLET | ORAL | 0 refills | Status: DC

## 2016-07-09 MED ORDER — ROC RETINOL CORREXION EYE EX CREA: TOPICAL_CREAM | CUTANEOUS | 11 refills | Status: DC

## 2016-07-09 NOTE — Progress Notes (Signed)
Pre visit review using our clinic review tool, if applicable. No additional management support is needed unless otherwise documented below in the visit note. 

## 2016-07-09 NOTE — Patient Instructions (Addendum)
Your EKG was OK today  Please continue all other medications as before, and refills have been done if requested - the adderall and retinol  Please have the pharmacy call with any other refills you may need.  Please continue your efforts at being more active, low cholesterol diet, and weight control.  You are otherwise up to date with prevention measures today.  Please keep your appointments with your specialists as you may have planned  Please go to the LAB in the Basement (turn left off the elevator) for the tests to be done today  You will be contacted by phone if any changes need to be made immediately.  Otherwise, you will receive a letter about your results with an explanation, but please check with MyChart first.  Please remember to sign up for MyChart if you have not done so, as this will be important to you in the future with finding out test results, communicating by private email, and scheduling acute appointments online when needed.  Please return in 1 year for your yearly visit, or sooner if needed, with Lab testing done 3-5 days before

## 2016-07-09 NOTE — Progress Notes (Signed)
Subjective:    Patient ID: Robin Farley, female    DOB: April 09, 1966, 51 y.o.   MRN: [id number]  HPI  Here for wellness and f/u;  Overall doing ok;  Pt denies Chest pain, worsening SOB, DOE, wheezing, orthopnea, PND, worsening LE edema, palpitations, dizziness or syncope.  Pt denies neurological change such as new headache, facial or extremity weakness.  Pt denies polydipsia, polyuria, or low sugar symptoms. Pt states overall good compliance with treatment and medications, good tolerability, and has been trying to follow appropriate diet.  Pt denies worsening depressive symptoms, suicidal ideation or panic. No fever, night sweats, wt loss, loss of appetite, or other constitutional symptoms.  Pt states good ability with ADL's, has low fall risk, home safety reviewed and adequate, no other significant changes in hearing or vision, and only occasionally active with exercise. Not as active in the past years when before she was doing martial arts fairly regularly. Left shoudler improved with PT, but no surgury needed.  To schedule pap with GYn soon.  Asks for retinol restart for rosacea that has worked well in the past  ADD med working well, still employed successfully a [employer] of [employer]. Past Medical History:  Diagnosis Date  . Abdominal pain, generalized 12/27/2008  . Acute sinusitis, unspecified 02/26/2007  . ADD 03/13/2010  . ALLERGIC RHINITIS 02/23/2007  . ANEMIA 02/05/2007  . Anxiety 11/17/2011  . ATTENTION DEFICIT DISORDER, HX OF 02/05/2007  . DEPRESSION 02/23/2007  . Depression 02/18/2011  . Deviated nasal septum 02/05/2007  . FATIGUE 02/23/2007  . GERD 12/27/2008  . HYPERLIPIDEMIA 02/23/2007  . SWELLING MASS OR LUMP IN HEAD AND NECK 07/27/2008  . UTI 02/23/2007   Past Surgical History:  Procedure Laterality Date  . BROW LIFT    . CESAREAN SECTION    . MELANOMA EXCISION WITH SENTINEL LYMPH NODE BIOPSY Left 10/24/2012   Procedure: MELANOMA wide EXCISION left lateral thigh WITH  SENTINEL LYMPH NODE BIOPSY left groin;  Surgeon: Edward Jolly, MD;  Location: [facility];  Service: General;  Laterality: Left;  . NASAL SEPTUM SURGERY      reports that she has never smoked. She has never used smokeless tobacco. She reports that she drinks alcohol. She reports that she does not use drugs. family history includes Alcohol abuse in her other; Atrial fibrillation in her father and sister; COPD in her father; Depression in her other; Glaucoma in her other; Hypertension in her mother. Allergies  Allergen Reactions  . Promethazine Hcl     Legs twitch   Current Outpatient Prescriptions on File Prior to Visit  Medication Sig Dispense Refill  . aspirin 81 MG EC tablet Take 81 mg by mouth daily.      . Diclofenac Sodium (PENNSAID) 2 % SOLN Place 2 application onto the skin 2 (two) times daily. 112 g 3   No current facility-administered medications on file prior to visit.    Review of Systems Constitutional: Negative for increased diaphoresis, or other activity, appetite or siginficant weight change other than noted HENT: Negative for worsening hearing loss, ear pain, facial swelling, mouth sores and neck stiffness.   Eyes: Negative for other worsening pain, redness or visual disturbance.  Respiratory: Negative for choking or stridor Cardiovascular: Negative for other chest pain and palpitations.  Gastrointestinal: Negative for worsening diarrhea, blood in stool, or abdominal distention Genitourinary: Negative for hematuria, flank pain or change in urine volume.  Musculoskeletal: Negative for myalgias or other joint complaints.  Skin: Negative  for other color change and wound or drainage.  Neurological: Negative for syncope and numbness. other than noted Hematological: Negative for adenopathy. or other swelling Psychiatric/Behavioral: Negative for hallucinations, SI, self-injury, decreased concentration or other worsening agitation.  All other system neg per  pt    Objective:   Physical Exam BP 122/80   Pulse 71   Temp 98 F (36.7 C) (Oral)   Ht 5\' 6"  (1.676 m)   Wt 163 lb (73.9 kg)   LMP 04/27/2016   SpO2 98%   BMI 26.31 kg/m  VS noted, not ill appearing, mild overwt Constitutional: Pt is oriented to person, place, and time. Appears well-developed and well-nourished, in no significant distress Head: Normocephalic and atraumatic  Eyes: Conjunctivae and EOM are normal. Pupils are equal, round, and reactive to light Right Ear: External ear normal.  Left Ear: External ear normal Nose: Nose normal.  Mouth/Throat: Oropharynx is clear and moist  Neck: Normal range of motion. Neck supple. No JVD present. No tracheal deviation present or significant neck LA or mass Cardiovascular: Normal rate, regular rhythm, normal heart sounds and intact distal pulses.   Pulmonary/Chest: Effort normal and breath sounds without rales or wheezing  Abdominal: Soft. Bowel sounds are normal. NT. No HSM  Musculoskeletal: Normal range of motion. Exhibits no edema Lymphadenopathy: Has no cervical adenopathy.  Neurological: Pt is alert and oriented to person, place, and time. Pt has normal reflexes. No cranial nerve deficit. Motor grossly intact Skin: Skin is warm and dry. No rash noted or new ulcers Psychiatric:  Has normal mood and affect. Behavior is normal. No other exam findings  ECG today I have personally interpreted Sinus  Rhythm     Assessment & Plan:

## 2016-07-10 LAB — HEPATITIS C ANTIBODY: HCV Ab: NEGATIVE

## 2016-07-10 NOTE — Assessment & Plan Note (Signed)
Ok for restart retinol asd,  to f/u any worsening symptoms or concerns

## 2016-07-10 NOTE — Assessment & Plan Note (Signed)
Stable, for med refill,  to f/u any worsening symptoms or concerns 

## 2016-07-10 NOTE — Assessment & Plan Note (Signed)
Symptomatically resolved,  to f/u any worsening symptoms or concerns 

## 2016-07-10 NOTE — Assessment & Plan Note (Signed)

## 2017-08-30 ENCOUNTER — Telehealth: Payer: Self-pay | Admitting: Internal Medicine

## 2017-08-30 DIAGNOSIS — F988 Other specified behavioral and emotional disorders with onset usually occurring in childhood and adolescence: Secondary | ICD-10-CM

## 2017-08-30 NOTE — Telephone Encounter (Signed)
Copied from [place] [id number]. Topic: Quick Communication - Rx Refill/Question >> Aug 30, 2017  3:11 PM Boyd Kerbs wrote: Medication:  amphetamine-dextroamphetamine (ADDERALL) 20 MG tablet Pt. Did not get April refill and it has expired.     Has the patient contacted their pharmacy? Yes.   (Agent: If no, request that the patient contact the pharmacy for the refill.) (Agent: If yes, when and what did the pharmacy advise?)  Preferred Pharmacy (with phone number or street name):   [facility] [place] [place] [facility] [place] [address] [place] [address] Phone: [phone] Fax: [phone]    Agent: Please be advised that RX refills may take up to 3 business days. We ask that you follow-up with your pharmacy.

## 2017-08-31 MED ORDER — AMPHETAMINE-DEXTROAMPHETAMINE 20 MG PO TABS: ORAL_TABLET | ORAL | 0 refills | Status: DC

## 2017-08-31 NOTE — Telephone Encounter (Signed)
Request for refill of Adderall.Last prescription on 07/09/16 #90. Pt states she did not get April refill and it has expired.   LOV: 07/09/16 Dr[facility] Jenny [facility]mann  [facility]    [address]

## 2017-08-31 NOTE — Telephone Encounter (Signed)
Done erx 

## 2017-12-06 ENCOUNTER — Telehealth: Payer: Self-pay | Admitting: Internal Medicine

## 2017-12-06 DIAGNOSIS — F988 Other specified behavioral and emotional disorders with onset usually occurring in childhood and adolescence: Secondary | ICD-10-CM

## 2017-12-06 NOTE — Telephone Encounter (Signed)
Patient requesting refill on adderall to be sent to [facility] on [address].

## 2017-12-06 NOTE — Addendum Note (Signed)
Addended by: [name] on: 12/06/2017 12:33 PM   Modules accepted: Orders

## 2017-12-06 NOTE — Telephone Encounter (Signed)
adderall not done erx as pt is due for ROV

## 2017-12-14 ENCOUNTER — Other Ambulatory Visit (INDEPENDENT_AMBULATORY_CARE_PROVIDER_SITE_OTHER): Payer: Managed Care, Other (non HMO)

## 2017-12-14 ENCOUNTER — Ambulatory Visit: Payer: Managed Care, Other (non HMO) | Admitting: Internal Medicine

## 2017-12-14 ENCOUNTER — Encounter: Payer: Self-pay | Admitting: Internal Medicine

## 2017-12-14 VITALS — BP 128/88 | HR 81 | Temp 98.3°F | Ht 66.0 in | Wt 157.0 lb

## 2017-12-14 DIAGNOSIS — N3941 Urge incontinence: Secondary | ICD-10-CM | POA: Diagnosis not present

## 2017-12-14 DIAGNOSIS — F988 Other specified behavioral and emotional disorders with onset usually occurring in childhood and adolescence: Secondary | ICD-10-CM

## 2017-12-14 DIAGNOSIS — Z0000 Encounter for general adult medical examination without abnormal findings: Secondary | ICD-10-CM | POA: Diagnosis not present

## 2017-12-14 LAB — URINALYSIS, ROUTINE W REFLEX MICROSCOPIC
BILIRUBIN URINE: NEGATIVE
Hgb urine dipstick: NEGATIVE
KETONES UR: NEGATIVE
Leukocytes, UA: NEGATIVE
Nitrite: NEGATIVE
PH: 7 (ref 5.0–8.0)
Specific Gravity, Urine: 1.01 (ref 1.000–1.030)
TOTAL PROTEIN, URINE-UPE24: NEGATIVE
UROBILINOGEN UA: 0.2 (ref 0.0–1.0)
Urine Glucose: NEGATIVE

## 2017-12-14 LAB — CBC WITH DIFFERENTIAL/PLATELET
BASOS PCT: 0.5 % (ref 0.0–3.0)
Basophils Absolute: 0 10*3/uL (ref 0.0–0.1)
EOS ABS: 0.1 10*3/uL (ref 0.0–0.7)
Eosinophils Relative: 1.2 % (ref 0.0–5.0)
HCT: 42.2 % (ref 36.0–46.0)
Hemoglobin: 14.4 g/dL (ref 12.0–15.0)
LYMPHS ABS: 1.6 10*3/uL (ref 0.7–4.0)
Lymphocytes Relative: 32.3 % (ref 12.0–46.0)
MCHC: 34 g/dL (ref 30.0–36.0)
MCV: 90.5 fl (ref 78.0–100.0)
MONO ABS: 0.3 10*3/uL (ref 0.1–1.0)
Monocytes Relative: 6.2 % (ref 3.0–12.0)
NEUTROS ABS: 2.9 10*3/uL (ref 1.4–7.7)
Neutrophils Relative %: 59.8 % (ref 43.0–77.0)
PLATELETS: 270 10*3/uL (ref 150.0–400.0)
RBC: 4.66 Mil/uL (ref 3.87–5.11)
RDW: 12.3 % (ref 11.5–15.5)
WBC: 4.8 10*3/uL (ref 4.0–10.5)

## 2017-12-14 LAB — HEPATIC FUNCTION PANEL
ALT: 9 U/L (ref 0–35)
AST: 12 U/L (ref 0–37)
Albumin: 4.5 g/dL (ref 3.5–5.2)
Alkaline Phosphatase: 73 U/L (ref 39–117)
BILIRUBIN DIRECT: 0.1 mg/dL (ref 0.0–0.3)
BILIRUBIN TOTAL: 0.4 mg/dL (ref 0.2–1.2)
Total Protein: 7.6 g/dL (ref 6.0–8.3)

## 2017-12-14 LAB — LIPID PANEL
CHOLESTEROL: 252 mg/dL — AB (ref 0–200)
HDL: 70.3 mg/dL (ref >39.00)
LDL Cholesterol: 151 mg/dL — ABNORMAL HIGH (ref 0–99)
NonHDL: 182.09
Total CHOL/HDL Ratio: 4
Triglycerides: 154 mg/dL — ABNORMAL HIGH (ref 0.0–149.0)
VLDL: 30.8 mg/dL (ref 0.0–40.0)

## 2017-12-14 LAB — BASIC METABOLIC PANEL
BUN: 9 mg/dL (ref 6–23)
CALCIUM: 10 mg/dL (ref 8.4–10.5)
CO2: 27 mEq/L (ref 19–32)
Chloride: 104 mEq/L (ref 96–112)
Creatinine, Ser: 0.87 mg/dL (ref 0.40–1.20)
GFR: 72.01 mL/min (ref >60.00)
GLUCOSE: 105 mg/dL — AB (ref 70–99)
POTASSIUM: 4.3 meq/L (ref 3.5–5.1)
SODIUM: 141 meq/L (ref 135–145)

## 2017-12-14 LAB — TSH: TSH: 3.07 u[IU]/mL (ref 0.35–4.50)

## 2017-12-14 MED ORDER — AMPHETAMINE-DEXTROAMPHETAMINE 20 MG PO TABS: ORAL_TABLET | ORAL | 0 refills | Status: DC

## 2017-12-14 MED ORDER — ESTROGENS, CONJUGATED 0.625 MG/GM VA CREA: 1.0000 | TOPICAL_CREAM | Freq: Every day | VAGINAL | 12 refills | Status: DC

## 2017-12-14 NOTE — Progress Notes (Signed)
Subjective:    Patient ID: Robin Farley, female    DOB: [birth year], 52 y.o.   MRN: [id number]  HPI  Here for wellness and f/u;  Overall doing ok;  Pt denies Chest pain, worsening SOB, DOE, wheezing, orthopnea, PND, worsening LE edema, palpitations, dizziness or syncope.  Pt denies neurological change such as new headache, facial or extremity weakness.  Pt denies polydipsia, polyuria, or low sugar symptoms. Pt states overall good compliance with treatment and medications, good tolerability, and has been trying to follow appropriate diet.  Pt denies worsening depressive symptoms, suicidal ideation or panic. No fever, night sweats, wt loss, loss of appetite, or other constitutional symptoms.  Pt states good ability with ADL's, has low fall risk, home safety reviewed and adequate, no other significant changes in hearing or vision, and only occasionally active with exercise.  Now taking azo for urinary urgency and incont but not helping  Plans to see Dr Noralee Stain for pap and mammogram.  No other complaints Past Medical History:  Diagnosis Date  . Abdominal pain, generalized 12/27/2008  . Acute sinusitis, unspecified 02/26/2007  . ADD 03/13/2010  . ALLERGIC RHINITIS 02/23/2007  . ANEMIA 02/05/2007  . Anxiety 11/17/2011  . ATTENTION DEFICIT DISORDER, HX OF 02/05/2007  . DEPRESSION 02/23/2007  . Depression 02/18/2011  . Deviated nasal septum 02/05/2007  . FATIGUE 02/23/2007  . GERD 12/27/2008  . HYPERLIPIDEMIA 02/23/2007  . SWELLING MASS OR LUMP IN HEAD AND NECK 07/27/2008  . UTI 02/23/2007   Past Surgical History:  Procedure Laterality Date  . BROW LIFT    . CESAREAN SECTION    . MELANOMA EXCISION WITH SENTINEL LYMPH NODE BIOPSY Left 10/24/2012   Procedure: MELANOMA wide EXCISION left lateral thigh WITH SENTINEL LYMPH NODE BIOPSY left groin;  Surgeon: Edward Jolly, MD;  Location: [facility];  Service: General;  Laterality: Left;  . NASAL SEPTUM SURGERY      reports that she  has never smoked. She has never used smokeless tobacco. She reports that she drinks alcohol. She reports that she does not use drugs. family history includes Alcohol abuse in her other; Atrial fibrillation in her father and sister; COPD in her father; Depression in her other; Glaucoma in her other; Hypertension in her mother. Allergies  Allergen Reactions  . Promethazine Hcl     Legs twitch   Current Outpatient Medications on File Prior to Visit  Medication Sig Dispense Refill  . aspirin 81 MG EC tablet Take 81 mg by mouth daily.      . Diclofenac Sodium (PENNSAID) 2 % SOLN Place 2 application onto the skin 2 (two) times daily. 112 g 3  . Emollient (ROC RETINOL CORREXION EYE) CREA Use as directed daily 15 mL 11   No current facility-administered medications on file prior to visit.    Review of Systems Constitutional: Negative for other unusual diaphoresis, sweats, appetite or weight changes HENT: Negative for other worsening hearing loss, ear pain, facial swelling, mouth sores or neck stiffness.   Eyes: Negative for other worsening pain, redness or other visual disturbance.  Respiratory: Negative for other stridor or swelling Cardiovascular: Negative for other palpitations or other chest pain  Gastrointestinal: Negative for worsening diarrhea or loose stools, blood in stool, distention or other pain Genitourinary: Negative for hematuria, flank pain or other change in urine volume.  Musculoskeletal: Negative for myalgias or other joint swelling.  Skin: Negative for other color change, or other wound or worsening drainage.  Neurological: Negative for other  syncope or numbness. Hematological: Negative for other adenopathy or swelling Psychiatric/Behavioral: Negative for hallucinations, other worsening agitation, SI, self-injury, or new decreased concentration All other system neg per pt    Objective:   Physical Exam BP 128/88   Pulse 81   Temp 98.3 F (36.8 C) (Oral)   Ht 5\' 6"   (1.676 m)   Wt 157 lb (71.2 kg)   SpO2 98%   BMI 25.34 kg/m  VS noted,  Constitutional: Pt is oriented to person, place, and time. Appears well-developed and well-nourished, in no significant distress and comfortable Head: Normocephalic and atraumatic  Eyes: Conjunctivae and EOM are normal. Pupils are equal, round, and reactive to light Right Ear: External ear normal without discharge Left Ear: External ear normal without discharge Nose: Nose without discharge or deformity Mouth/Throat: Oropharynx is without other ulcerations and moist  Neck: Normal range of motion. Neck supple. No JVD present. No tracheal deviation present or significant neck LA or mass Cardiovascular: Normal rate, regular rhythm, normal heart sounds and intact distal pulses.   Pulmonary/Chest: WOB normal and breath sounds without rales or wheezing  Abdominal: Soft. Bowel sounds are normal. NT. No HSM  Musculoskeletal: Normal range of motion. Exhibits no edema Lymphadenopathy: Has no other cervical adenopathy.  Neurological: Pt is alert and oriented to person, place, and time. Pt has normal reflexes. No cranial nerve deficit. Motor grossly intact, Gait intact Skin: Skin is warm and dry. No rash noted or new ulcerations Psychiatric:  Has normal mood and affect. Behavior is normal without agitation No other exam findings Lab Results  Component Value Date   WBC 4.8 12/14/2017   HGB 14.4 12/14/2017   HCT 42.2 12/14/2017   PLT 270.0 12/14/2017   GLUCOSE 105 (H) 12/14/2017   CHOL 252 (H) 12/14/2017   TRIG 154.0 (H) 12/14/2017   HDL 70.30 12/14/2017   LDLDIRECT 159.0 08/21/2014   LDLCALC 151 (H) 12/14/2017   ALT 9 12/14/2017   AST 12 12/14/2017   NA 141 12/14/2017   K 4.3 12/14/2017   CL 104 12/14/2017   CREATININE 0.87 12/14/2017   BUN 9 12/14/2017   CO2 27 12/14/2017   TSH 3.07 12/14/2017       Assessment & Plan:

## 2017-12-14 NOTE — Assessment & Plan Note (Signed)
stable overall by history and exam, recent data reviewed with pt, and pt to continue medical treatment as before,  to f/u any worsening symptoms or concerns  

## 2017-12-14 NOTE — Assessment & Plan Note (Signed)
Suspect possible vaginal atrophy post menopause, for premarin cream asd

## 2017-12-14 NOTE — Assessment & Plan Note (Signed)

## 2017-12-14 NOTE — Patient Instructions (Addendum)
Please take all new medication as prescribed - the premarin cream  Please be sure to call for your GYN yearly appt  Please continue all other medications as before, and refills have been done if requested - the adderall  Please have the pharmacy call with any other refills you may need.  Please continue your efforts at being more active, low cholesterol diet, and weight control.  You are otherwise up to date with prevention measures today.  Please keep your appointments with your specialists as you may have planned  Please go to the LAB in the Basement (turn left off the elevator) for the tests to be done today  You will be contacted by phone if any changes need to be made immediately.  Otherwise, you will receive a letter about your results with an explanation, but please check with MyChart first.  Please remember to sign up for MyChart if you have not done so, as this will be important to you in the future with finding out test results, communicating by private email, and scheduling acute appointments online when needed.  Please return in 1 year for your yearly visit, or sooner if needed, with Lab testing done 3-5 days before

## 2017-12-15 ENCOUNTER — Encounter: Payer: Self-pay | Admitting: Internal Medicine

## 2017-12-15 LAB — URINE CULTURE
MICRO NUMBER:: 91048910
SPECIMEN QUALITY:: ADEQUATE

## 2018-02-14 ENCOUNTER — Other Ambulatory Visit: Payer: Self-pay | Admitting: Internal Medicine

## 2018-02-14 DIAGNOSIS — F988 Other specified behavioral and emotional disorders with onset usually occurring in childhood and adolescence: Secondary | ICD-10-CM

## 2018-02-14 NOTE — Telephone Encounter (Signed)
Copied from [place] [id number]. Topic: Quick Communication - Rx Refill/Question >> Feb 14, 2018  5:21 PM Percell Belt A wrote: Medication: amphetamine-dextroamphetamine (ADDERALL) 20 MG tablet [[id number]]  Has the patient contacted their pharmacy? No  (Agent: If no, request that the patient contact the pharmacy for the refill.) (Agent: If yes, when and what did the pharmacy advise?)  Preferred Pharmacy (with phone number or street name):   [facility] [place] [place] [address] [phone] (Phone)    Agent: Please be advised that RX refills may take up to 3 business days. We ask that you follow-up with your pharmacy.

## 2018-02-14 NOTE — Telephone Encounter (Signed)
Requested medication (s) are due for refill today: yes  Requested medication (s) are on the active medication list: yes  Last refill:  12/14/17 #90  Future visit scheduled: yes, 12/16/18      Requested Prescriptions  Pending Prescriptions Disp Refills   amphetamine-dextroamphetamine (ADDERALL) 20 MG tablet 90 tablet 0    Sig: Take 1 and 1/2 tablet by mouth twice per day - to fill Aug 23, 2015     Not Delegated - Psychiatry:  Stimulants/ADHD Failed - 02/14/2018  5:30 PM      Failed - This refill cannot be delegated      Failed - Urine Drug Screen completed in last 360 days.      Passed - Valid encounter within last 3 months    Recent Outpatient Visits          2 months ago Preventative health care   [facility] -Georges Mouse, MD   1 year ago Preventative health care   [facility] -Georges Mouse, MD   2 years ago Left shoulder pain   [place], [place], DO   2 years ago Calcific bursitis of shoulder   [place], [place], DO   2 years ago Calcific bursitis of shoulder   [place], [place], DO      Future Appointments            In 10 months Jenny Reichmann, Hunt Oris, MD [place], [facility]

## 2018-02-15 MED ORDER — AMPHETAMINE-DEXTROAMPHETAMINE 20 MG PO TABS: ORAL_TABLET | ORAL | 0 refills | Status: DC

## 2018-02-15 NOTE — Telephone Encounter (Signed)
Done erx 

## 2018-02-15 NOTE — Telephone Encounter (Signed)
   LOV:12/14/17 NextOV:12/16/18  Last Filled/Quantity:12/14/17 90#

## 2018-06-09 ENCOUNTER — Other Ambulatory Visit: Payer: Self-pay | Admitting: Internal Medicine

## 2018-06-09 DIAGNOSIS — F988 Other specified behavioral and emotional disorders with onset usually occurring in childhood and adolescence: Secondary | ICD-10-CM

## 2018-06-09 MED ORDER — AMPHETAMINE-DEXTROAMPHETAMINE 20 MG PO TABS: ORAL_TABLET | ORAL | 0 refills | Status: DC

## 2018-06-09 NOTE — Telephone Encounter (Signed)
Copied from [place] [id number]. Topic: Quick Communication - Rx Refill/Question >> Jun 09, 2018  3:20 PM Bea Graff, NT wrote: Medication: amphetamine-dextroamphetamine (ADDERALL) 20 MG tablet  Has the patient contacted their pharmacy? Yes.   (Agent: If no, request that the patient contact the pharmacy for the refill.) (Agent: If yes, when and what did the pharmacy advise?)  Preferred Pharmacy (with phone number or street name): [facility] [place] [place] [address] [phone] (Phone) [phone] (Fax)    Agent: Please be advised that RX refills may take up to 3 business days. We ask that you follow-up with your pharmacy.

## 2018-06-09 NOTE — Telephone Encounter (Signed)
Below signed in error.

## 2018-06-09 NOTE — Addendum Note (Signed)
Addended by: Dimple Nanas on: 06/09/2018 03:35 PM   Modules accepted: Orders

## 2018-06-09 NOTE — Telephone Encounter (Signed)
Done erx 

## 2018-06-09 NOTE — Addendum Note (Signed)
Addended by: [name] on: 06/09/2018 05:09 PM   Modules accepted: Orders

## 2018-09-29 ENCOUNTER — Telehealth: Payer: Self-pay | Admitting: Internal Medicine

## 2018-09-29 DIAGNOSIS — F988 Other specified behavioral and emotional disorders with onset usually occurring in childhood and adolescence: Secondary | ICD-10-CM

## 2018-09-29 NOTE — Telephone Encounter (Signed)
RX REFILL  amphetamine-dextroamphetamine (ADDERALL) 20 MG tablet  Pharmacy:  [facility] [place] [place] [address] [phone] (Phone) [phone] (Fax)   Patient call back # [phone]

## 2018-10-03 MED ORDER — AMPHETAMINE-DEXTROAMPHETAMINE 20 MG PO TABS: ORAL_TABLET | ORAL | 0 refills | Status: DC

## 2018-10-03 NOTE — Telephone Encounter (Signed)
Pt called about the status of her Rx refill for Adderall/ please advise

## 2018-10-03 NOTE — Telephone Encounter (Signed)
Done erx 

## 2018-12-16 ENCOUNTER — Other Ambulatory Visit (INDEPENDENT_AMBULATORY_CARE_PROVIDER_SITE_OTHER): Payer: Managed Care, Other (non HMO)

## 2018-12-16 ENCOUNTER — Other Ambulatory Visit: Payer: Self-pay | Admitting: Internal Medicine

## 2018-12-16 ENCOUNTER — Other Ambulatory Visit: Payer: Self-pay

## 2018-12-16 ENCOUNTER — Ambulatory Visit (INDEPENDENT_AMBULATORY_CARE_PROVIDER_SITE_OTHER): Payer: Managed Care, Other (non HMO) | Admitting: Internal Medicine

## 2018-12-16 ENCOUNTER — Encounter: Payer: Self-pay | Admitting: Internal Medicine

## 2018-12-16 VITALS — BP 126/82 | HR 103 | Temp 98.2°F | Ht 66.0 in | Wt 159.0 lb

## 2018-12-16 DIAGNOSIS — M25511 Pain in right shoulder: Secondary | ICD-10-CM

## 2018-12-16 DIAGNOSIS — E611 Iron deficiency: Secondary | ICD-10-CM

## 2018-12-16 DIAGNOSIS — E559 Vitamin D deficiency, unspecified: Secondary | ICD-10-CM

## 2018-12-16 DIAGNOSIS — Z0000 Encounter for general adult medical examination without abnormal findings: Secondary | ICD-10-CM

## 2018-12-16 DIAGNOSIS — G8929 Other chronic pain: Secondary | ICD-10-CM

## 2018-12-16 DIAGNOSIS — E538 Deficiency of other specified B group vitamins: Secondary | ICD-10-CM

## 2018-12-16 DIAGNOSIS — M25462 Effusion, left knee: Secondary | ICD-10-CM

## 2018-12-16 DIAGNOSIS — R519 Headache, unspecified: Secondary | ICD-10-CM

## 2018-12-16 DIAGNOSIS — R51 Headache: Secondary | ICD-10-CM

## 2018-12-16 DIAGNOSIS — F988 Other specified behavioral and emotional disorders with onset usually occurring in childhood and adolescence: Secondary | ICD-10-CM

## 2018-12-16 DIAGNOSIS — Z0001 Encounter for general adult medical examination with abnormal findings: Secondary | ICD-10-CM

## 2018-12-16 DIAGNOSIS — F419 Anxiety disorder, unspecified: Secondary | ICD-10-CM

## 2018-12-16 LAB — CBC WITH DIFFERENTIAL/PLATELET
Basophils Absolute: 0 10*3/uL (ref 0.0–0.1)
Basophils Relative: 0.9 % (ref 0.0–3.0)
Eosinophils Absolute: 0.1 10*3/uL (ref 0.0–0.7)
Eosinophils Relative: 1.5 % (ref 0.0–5.0)
HCT: 40.2 % (ref 36.0–46.0)
Hemoglobin: 13.5 g/dL (ref 12.0–15.0)
Lymphocytes Relative: 40.4 % (ref 12.0–46.0)
Lymphs Abs: 2.1 10*3/uL (ref 0.7–4.0)
MCHC: 33.7 g/dL (ref 30.0–36.0)
MCV: 91.1 fl (ref 78.0–100.0)
Monocytes Absolute: 0.4 10*3/uL (ref 0.1–1.0)
Monocytes Relative: 6.9 % (ref 3.0–12.0)
Neutro Abs: 2.6 10*3/uL (ref 1.4–7.7)
Neutrophils Relative %: 50.3 % (ref 43.0–77.0)
Platelets: 294 10*3/uL (ref 150.0–400.0)
RBC: 4.42 Mil/uL (ref 3.87–5.11)
RDW: 12.2 % (ref 11.5–15.5)
WBC: 5.3 10*3/uL (ref 4.0–10.5)

## 2018-12-16 LAB — URINALYSIS, ROUTINE W REFLEX MICROSCOPIC
Bilirubin Urine: NEGATIVE
Hgb urine dipstick: NEGATIVE
Ketones, ur: NEGATIVE
Leukocytes,Ua: NEGATIVE
Nitrite: NEGATIVE
RBC / HPF: NONE SEEN (ref 0–?)
Specific Gravity, Urine: 1.02 (ref 1.000–1.030)
Total Protein, Urine: NEGATIVE
Urine Glucose: NEGATIVE
Urobilinogen, UA: 0.2 (ref 0.0–1.0)
pH: 7 (ref 5.0–8.0)

## 2018-12-16 LAB — BASIC METABOLIC PANEL
BUN: 14 mg/dL (ref 6–23)
CO2: 29 mEq/L (ref 19–32)
Calcium: 9.6 mg/dL (ref 8.4–10.5)
Chloride: 104 mEq/L (ref 96–112)
Creatinine, Ser: 0.83 mg/dL (ref 0.40–1.20)
GFR: 71.27 mL/min (ref >60.00)
Glucose, Bld: 98 mg/dL (ref 70–99)
Potassium: 4 mEq/L (ref 3.5–5.1)
Sodium: 141 mEq/L (ref 135–145)

## 2018-12-16 LAB — LIPID PANEL
Cholesterol: 275 mg/dL — ABNORMAL HIGH (ref 0–200)
HDL: 72.9 mg/dL (ref >39.00)
LDL Cholesterol: 172 mg/dL — ABNORMAL HIGH (ref 0–99)
NonHDL: 202.44
Total CHOL/HDL Ratio: 4
Triglycerides: 151 mg/dL — ABNORMAL HIGH (ref 0.0–149.0)
VLDL: 30.2 mg/dL (ref 0.0–40.0)

## 2018-12-16 LAB — HEPATIC FUNCTION PANEL
ALT: 18 U/L (ref 0–35)
AST: 16 U/L (ref 0–37)
Albumin: 4.4 g/dL (ref 3.5–5.2)
Alkaline Phosphatase: 83 U/L (ref 39–117)
Bilirubin, Direct: 0 mg/dL (ref 0.0–0.3)
Total Bilirubin: 0.4 mg/dL (ref 0.2–1.2)
Total Protein: 7.6 g/dL (ref 6.0–8.3)

## 2018-12-16 LAB — IBC PANEL
Iron: 93 ug/dL (ref 42–145)
Saturation Ratios: 25.6 % (ref 20.0–50.0)
Transferrin: 259 mg/dL (ref 212.0–360.0)

## 2018-12-16 LAB — VITAMIN B12: Vitamin B-12: 383 pg/mL (ref 211–911)

## 2018-12-16 LAB — VITAMIN D 25 HYDROXY (VIT D DEFICIENCY, FRACTURES): VITD: 28.76 ng/mL — ABNORMAL LOW (ref 30.00–100.00)

## 2018-12-16 LAB — TSH: TSH: 2.38 u[IU]/mL (ref 0.35–4.50)

## 2018-12-16 MED ORDER — VITAMIN D (ERGOCALCIFEROL) 1.25 MG (50000 UNIT) PO CAPS: 50000.0000 [IU] | ORAL_CAPSULE | ORAL | 0 refills | Status: DC

## 2018-12-16 MED ORDER — PREMARIN 0.625 MG/GM VA CREA: 1.0000 | TOPICAL_CREAM | Freq: Every day | VAGINAL | 12 refills | Status: DC

## 2018-12-16 MED ORDER — AMPHETAMINE-DEXTROAMPHETAMINE 20 MG PO TABS: ORAL_TABLET | ORAL | 0 refills | Status: DC

## 2018-12-16 MED ORDER — DICLOFENAC SODIUM 1 % TD GEL: 2.0000 g | Freq: Four times a day (QID) | TRANSDERMAL | 5 refills | Status: DC | PRN

## 2018-12-16 MED ORDER — ATORVASTATIN CALCIUM 20 MG PO TABS: 20.0000 mg | ORAL_TABLET | Freq: Every day | ORAL | 3 refills | Status: DC

## 2018-12-16 NOTE — Patient Instructions (Signed)
Please take all new medication as prescribed - the voltaren gel for pain  Please continue all other medications as before, and refills have been done if requested - the adderall  Please have the pharmacy call with any other refills you may need.  Please continue your efforts at being more active, low cholesterol diet, and weight control.  You are otherwise up to date with prevention measures today.  Please keep your appointments with your specialists as you may have planned  You will be contacted regarding the referral for: Brain MRI  Please go to the LAB in the Basement (turn left off the elevator) for the tests to be done today  You will be contacted by phone if any changes need to be made immediately.  Otherwise, you will receive a letter about your results with an explanation, but please check with MyChart first.  Please remember to sign up for MyChart if you have not done so, as this will be important to you in the future with finding out test results, communicating by private email, and scheduling acute appointments online when needed.  Please return in 1 year for your yearly visit, or sooner if needed, with Lab testing done 3-5 days before

## 2018-12-16 NOTE — Progress Notes (Signed)
Subjective:    Patient ID: Robin Farley, female    DOB: 10/09/1965, 53 y.o.   MRN: [id number]  HPI  Here for wellness and f/u;  Overall doing ok;  Pt denies Chest pain, worsening SOB, DOE, wheezing, orthopnea, PND, worsening LE edema, palpitations, dizziness or syncope.  Pt denies neurological change such as new facial or extremity weakness.  Pt denies polydipsia, polyuria, or low sugar symptoms. Pt states overall good compliance with treatment and medications, good tolerability, and has been trying to follow appropriate diet.  Pt denies worsening depressive symptoms, suicidal ideation or panic. No fever, night sweats, wt loss, loss of appetite, or other constitutional symptoms.  Pt states good ability with ADL's, has low fall risk, home safety reviewed and adequate, no other significant changes in hearing or vision, and only occasionally active with exercis Wt Readings from Last 3 Encounters:  12/16/18 159 lb (72.1 kg)  12/14/17 157 lb (71.2 kg)  07/09/16 163 lb (73.9 kg)  Also,.adderall working well for ADD.  Has been much more "on edge" recently with stress over failing marriage and talk of divorce.   Also c/o 2 wks worsening acute on more chronic right shoulder twinges, brief intermittent mild anterior mostly but not worse to abduction or forward elevation or reduced ROM.  Also c/o stye to lower right medial eyelid x 2 wks, has seen optho and tx with drops and warm compress and seems to her to be improved, just wanted to look at it today as well.  Also has 2 mo worsening left knee pain diffuse with occasional mild swelling, mild pain, without giveaways or falling.    Also c/o 3 wks onset mod to severe diffuse daily HA dull sometimes feels the worst of her life, without other focal neuro signs or changes.  No fever, ST, cough.  Nothing seems to make better or worse such as exertion or position.  OTC med not helping for pain. Past Medical History:  Diagnosis Date  . Abdominal pain, generalized  12/27/2008  . Acute sinusitis, unspecified 02/26/2007  . ADD 03/13/2010  . ALLERGIC RHINITIS 02/23/2007  . ANEMIA 02/05/2007  . Anxiety 11/17/2011  . ATTENTION DEFICIT DISORDER, HX OF 02/05/2007  . DEPRESSION 02/23/2007  . Depression 02/18/2011  . Deviated nasal septum 02/05/2007  . FATIGUE 02/23/2007  . GERD 12/27/2008  . HYPERLIPIDEMIA 02/23/2007  . SWELLING MASS OR LUMP IN HEAD AND NECK 07/27/2008  . UTI 02/23/2007   Past Surgical History:  Procedure Laterality Date  . BROW LIFT    . CESAREAN SECTION    . MELANOMA EXCISION WITH SENTINEL LYMPH NODE BIOPSY Left 10/24/2012   Procedure: MELANOMA wide EXCISION left lateral thigh WITH SENTINEL LYMPH NODE BIOPSY left groin;  Surgeon: Edward Jolly, MD;  Location: [facility];  Service: General;  Laterality: Left;  . NASAL SEPTUM SURGERY      reports that she has never smoked. She has never used smokeless tobacco. She reports current alcohol use. She reports that she does not use drugs. family history includes Alcohol abuse in an other family member; Atrial fibrillation in her father and sister; COPD in her father; Depression in an other family member; Glaucoma in an other family member; Hypertension in her mother. Allergies  Allergen Reactions  . Promethazine Hcl     Legs twitch   Current Outpatient Medications on File Prior to Visit  Medication Sig Dispense Refill  . amphetamine-dextroamphetamine (ADDERALL) 20 MG tablet Take 1 and 1/2 tablet by mouth twice  per day 90 tablet 0  . aspirin 81 MG EC tablet Take 81 mg by mouth daily.      Marland Kitchen conjugated estrogens (PREMARIN) vaginal cream Place 1 Applicatorful vaginally daily. 42.5 g 12  . Diclofenac Sodium (PENNSAID) 2 % SOLN Place 2 application onto the skin 2 (two) times daily. 112 g 3  . Emollient (ROC RETINOL CORREXION EYE) CREA Use as directed daily 15 mL 11   No current facility-administered medications on file prior to visit.    Review of Systems Constitutional:  Negative for other unusual diaphoresis, sweats, appetite or weight changes HENT: Negative for other worsening hearing loss, ear pain, facial swelling, mouth sores or neck stiffness.   Eyes: Negative for other worsening pain, redness or other visual disturbance.  Respiratory: Negative for other stridor or swelling Cardiovascular: Negative for other palpitations or other chest pain  Gastrointestinal: Negative for worsening diarrhea or loose stools, blood in stool, distention or other pain Genitourinary: Negative for hematuria, flank pain or other change in urine volume.  Musculoskeletal: Negative for myalgias or other joint swelling.  Skin: Negative for other color change, or other wound or worsening drainage.  Neurological: Negative for other syncope or numbness. Hematological: Negative for other adenopathy or swelling Psychiatric/Behavioral: Negative for hallucinations, other worsening agitation, SI, self-injury, or new decreased concentration All other system neg per pt    Objective:   Physical Exam BP 126/82   Pulse (!) 103   Temp 98.2 F (36.8 C) (Oral)   Ht 5\' 6"  (1.676 m)   Wt 159 lb (72.1 kg)   SpO2 98%   BMI 25.66 kg/m  VS noted,  Constitutional: Pt is oriented to person, place, and time. Appears well-developed and well-nourished, in no significant distress and comfortable Head: Normocephalic and atraumatic  Eyes: Conjunctivae and EOM are normal. Pupils are equal, round, and reactive to light; right lower medial stye very mild without increased swelling abscess or chalazion Right Ear: External ear normal without discharge Left Ear: External ear normal without discharge Nose: Nose without discharge or deformity Mouth/Throat: Oropharynx is without other ulcerations and moist  Neck: Normal range of motion. Neck supple. No JVD present. No tracheal deviation present or significant neck LA or mass Cardiovascular: Normal rate, regular rhythm, normal heart sounds and intact distal  pulses.   Pulmonary/Chest: WOB normal and breath sounds without rales or wheezing  Abdominal: Soft. Bowel sounds are normal. NT. No HSM  Musculoskeletal: Normal range of motion. Exhibits no edema, left knee with mild to mod bony degenerative change with small effusion Right shoulder without tender, swelling and FROM Lymphadenopathy: Has no other cervical adenopathy.  Neurological: Pt is alert and oriented to person, place, and time. Pt has normal reflexes. No cranial nerve deficit. Motor grossly intact, Gait intact Skin: Skin is warm and dry. No rash noted or new ulcerations Psychiatric:  Has normal mood and affect. Behavior is normal without agitation No other exam findings Lab Results  Component Value Date   WBC 4.8 12/14/2017   HGB 14.4 12/14/2017   HCT 42.2 12/14/2017   PLT 270.0 12/14/2017   GLUCOSE 105 (H) 12/14/2017   CHOL 252 (H) 12/14/2017   TRIG 154.0 (H) 12/14/2017   HDL 70.30 12/14/2017   LDLDIRECT 159.0 08/21/2014   LDLCALC 151 (H) 12/14/2017   ALT 9 12/14/2017   AST 12 12/14/2017   NA 141 12/14/2017   K 4.3 12/14/2017   CL 104 12/14/2017   CREATININE 0.87 12/14/2017   BUN 9 12/14/2017  CO2 27 12/14/2017   TSH 3.07 12/14/2017      Assessment & Plan:

## 2018-12-17 ENCOUNTER — Encounter: Payer: Self-pay | Admitting: Internal Medicine

## 2018-12-17 NOTE — Assessment & Plan Note (Signed)
Mild intermittent, etiology unclear, exam benign, declines sport med referral

## 2018-12-17 NOTE — Assessment & Plan Note (Signed)
Stable, cont same tx, refills done

## 2018-12-17 NOTE — Assessment & Plan Note (Addendum)
New onset, mod to severe, no neuro change,  etiology unclear for MRI head r/o malignancy given hx of melanoma  In addition to the time spent performing CPE, I spent an additional 40 minutes face to face,in which greater than 50% of this time was spent in counseling and coordination of care for patient's acute illness as documented, including the differential dx, treatment, further evaluation and other management of HA, ADD, anxiety, right shoulder pain, left knee effusion,

## 2018-12-17 NOTE — Assessment & Plan Note (Signed)

## 2018-12-17 NOTE — Addendum Note (Signed)
Addended by: [name] on: 12/17/2018 04:39 PM   Modules accepted: Orders

## 2018-12-17 NOTE — Assessment & Plan Note (Signed)
C/w DJD, for volt gel prn, declines ortho referral for now

## 2018-12-17 NOTE — Assessment & Plan Note (Signed)
D/w pt, declines referral for couseling at this time

## 2018-12-20 ENCOUNTER — Telehealth: Payer: Self-pay

## 2018-12-20 NOTE — Telephone Encounter (Signed)
-----   Message from [name], MD sent at 12/16/2018  7:37 PM EDT ----- Letter sent, cont same tx except  The test results show that your current treatment is OK, as the tests are stable, except the Vitamin D level is low, and the LDL cholesterol is severely high.  Please take Vitamin D 50000 units weekly for 12 weeks, then plan to change to OTC Vitamin D3 at 2000 units per day, indefinitely.  Also, please start generic Lipitor 20 mg per day to help reduce risk of heart disease and stroke.    Tasha Diaz to please inform pt, I will do rx x 2

## 2018-12-20 NOTE — Telephone Encounter (Signed)
Called pt, LVM.   CRM created.  

## 2018-12-29 ENCOUNTER — Ambulatory Visit
Admission: RE | Admit: 2018-12-29 | Discharge: 2018-12-29 | Disposition: A | Discharge status: Home / Self Care | Payer: Managed Care, Other (non HMO) | Source: Ambulatory Visit | Attending: Internal Medicine | Admitting: Internal Medicine

## 2018-12-29 ENCOUNTER — Other Ambulatory Visit: Payer: Self-pay

## 2018-12-29 ENCOUNTER — Encounter: Payer: Self-pay | Admitting: Internal Medicine

## 2018-12-29 DIAGNOSIS — R519 Headache, unspecified: Secondary | ICD-10-CM

## 2019-03-07 ENCOUNTER — Other Ambulatory Visit: Payer: Self-pay | Admitting: Internal Medicine

## 2019-06-02 ENCOUNTER — Other Ambulatory Visit: Payer: Self-pay

## 2019-06-02 ENCOUNTER — Ambulatory Visit: Payer: Managed Care, Other (non HMO) | Admitting: Internal Medicine

## 2019-06-02 ENCOUNTER — Encounter: Payer: Self-pay | Admitting: Internal Medicine

## 2019-06-02 VITALS — BP 170/100 | HR 81 | Temp 98.2°F | Ht 66.0 in | Wt 154.0 lb

## 2019-06-02 DIAGNOSIS — F988 Other specified behavioral and emotional disorders with onset usually occurring in childhood and adolescence: Secondary | ICD-10-CM

## 2019-06-02 DIAGNOSIS — E785 Hyperlipidemia, unspecified: Secondary | ICD-10-CM | POA: Diagnosis not present

## 2019-06-02 DIAGNOSIS — R030 Elevated blood-pressure reading, without diagnosis of hypertension: Secondary | ICD-10-CM

## 2019-06-02 DIAGNOSIS — R739 Hyperglycemia, unspecified: Secondary | ICD-10-CM

## 2019-06-02 DIAGNOSIS — E559 Vitamin D deficiency, unspecified: Secondary | ICD-10-CM

## 2019-06-02 MED ORDER — VITAMIN D (ERGOCALCIFEROL) 1.25 MG (50000 UNIT) PO CAPS: 50000.0000 [IU] | ORAL_CAPSULE | ORAL | 0 refills | Status: DC

## 2019-06-02 MED ORDER — METFORMIN HCL ER 500 MG PO TB24: 500.0000 mg | ORAL_TABLET | Freq: Every day | ORAL | 3 refills | Status: DC

## 2019-06-02 MED ORDER — AMPHETAMINE-DEXTROAMPHETAMINE 20 MG PO TABS: ORAL_TABLET | ORAL | 0 refills | Status: DC

## 2019-06-02 NOTE — Assessment & Plan Note (Addendum)
stable overall by history and exam, recent data reviewed with pt, and pt to continue medical treatment as before,  to f/u any worsening symptoms or concerns  I spent 41 minutes in addition to time for wellness examination in preparing to see the patient by review of recent labs, imaging and procedures, obtaining and reviewing separately obtained history, communicating with the patient and family or caregiver, ordering medications, tests or procedures, and documenting clinical information in the EHR including the differential Dx, treatment, and any further evaluation and other management of ADD, HLD, hyperlglycemia, elevated BP, vit d def

## 2019-06-02 NOTE — Progress Notes (Signed)
Subjective:    Patient ID: Robin Farley, female    DOB: 1966/03/04, 54 y.o.   MRN: [id number]  HPI  Here to f/u; overall doing ok,  Pt denies chest pain, increasing sob or doe, wheezing, orthopnea, PND, increased LE swelling, palpitations, dizziness or syncope.  Pt denies new neurological symptoms such as new headache, or facial or extremity weakness or numbness.  Pt denies polydipsia, polyuria, or low sugar episode.  Pt states overall good compliance with meds, mostly trying to follow appropriate diet, with wt overall stable,  but little exercise however.  ADD med working well.  No new complaints Past Medical History:  Diagnosis Date  . Abdominal pain, generalized 12/27/2008  . Acute sinusitis, unspecified 02/26/2007  . ADD 03/13/2010  . ALLERGIC RHINITIS 02/23/2007  . ANEMIA 02/05/2007  . Anxiety 11/17/2011  . ATTENTION DEFICIT DISORDER, HX OF 02/05/2007  . DEPRESSION 02/23/2007  . Depression 02/18/2011  . Deviated nasal septum 02/05/2007  . FATIGUE 02/23/2007  . GERD 12/27/2008  . HYPERLIPIDEMIA 02/23/2007  . SWELLING MASS OR LUMP IN HEAD AND NECK 07/27/2008  . UTI 02/23/2007   Past Surgical History:  Procedure Laterality Date  . BROW LIFT    . CESAREAN SECTION    . MELANOMA EXCISION WITH SENTINEL LYMPH NODE BIOPSY Left 10/24/2012   Procedure: MELANOMA wide EXCISION left lateral thigh WITH SENTINEL LYMPH NODE BIOPSY left groin;  Surgeon: Edward Jolly, MD;  Location: [facility];  Service: General;  Laterality: Left;  . NASAL SEPTUM SURGERY      reports that she has never smoked. She has never used smokeless tobacco. She reports current alcohol use. She reports that she does not use drugs. family history includes Alcohol abuse in an other family member; Atrial fibrillation in her father and sister; COPD in her father; Depression in an other family member; Glaucoma in an other family member; Hypertension in her mother. Allergies  Allergen Reactions  . Promethazine  Hcl     Legs twitch   Current Outpatient Medications on File Prior to Visit  Medication Sig Dispense Refill  . aspirin 81 MG EC tablet Take 81 mg by mouth daily.      Marland Kitchen atorvastatin (LIPITOR) 20 MG tablet Take 1 tablet (20 mg total) by mouth daily. 90 tablet 3  . conjugated estrogens (PREMARIN) vaginal cream Place 1 Applicatorful vaginally daily. 42.5 g 12  . diclofenac sodium (VOLTAREN) 1 % GEL Apply 2 g topically 4 (four) times daily as needed. 100 g 5  . Emollient (ROC RETINOL CORREXION EYE) CREA Use as directed daily 15 mL 11   No current facility-administered medications on file prior to visit.   Review of Systems All otherwise neg per pt     Objective:   Physical Exam BP (!) 170/100 (BP Location: Right Arm, Patient Position: Sitting, Cuff Size: Normal)   Pulse 81   Temp 98.2 F (36.8 C) (Oral)   Ht 5\' 6"  (1.676 m)   Wt 154 lb (69.9 kg)   SpO2 98%   BMI 24.86 kg/m  VS noted,  Constitutional: Pt appears in NAD HENT: Head: NCAT.  Right Ear: External ear normal.  Left Ear: External ear normal.  Eyes: . Pupils are equal, round, and reactive to light. Conjunctivae and EOM are normal Nose: without d/c or deformity Neck: Neck supple. Gross normal ROM Cardiovascular: Normal rate and regular rhythm.   Pulmonary/Chest: Effort normal and breath sounds without rales or wheezing.  Abd:  Soft, NT, ND, +  BS, no organomegaly Neurological: Pt is alert. At baseline orientation, motor grossly intact Skin: Skin is warm. No rashes, other new lesions, no LE edema Psychiatric: Pt behavior is normal without agitation  All otherwise neg per pt Lab Results  Component Value Date   WBC 5.3 12/16/2018   HGB 13.5 12/16/2018   HCT 40.2 12/16/2018   PLT 294.0 12/16/2018   GLUCOSE 98 12/16/2018   CHOL 275 (H) 12/16/2018   TRIG 151.0 (H) 12/16/2018   HDL 72.90 12/16/2018   LDLDIRECT 159.0 08/21/2014   LDLCALC 172 (H) 12/16/2018   ALT 18 12/16/2018   AST 16 12/16/2018   NA 141 12/16/2018     K 4.0 12/16/2018   CL 104 12/16/2018   CREATININE 0.83 12/16/2018   BUN 14 12/16/2018   CO2 29 12/16/2018   TSH 2.38 12/16/2018          Assessment & Plan:

## 2019-06-02 NOTE — Assessment & Plan Note (Signed)
Mild, asympt, pt states strong FH of DM, ok for metformin for probable preDM

## 2019-06-02 NOTE — Assessment & Plan Note (Signed)
Very unusual, declines change in tx, to f/u BP at home daily for 2 wks and call for average > 140/90

## 2019-06-02 NOTE — Assessment & Plan Note (Signed)
Declines change in tx, working on diet, for CT cardiac score

## 2019-06-02 NOTE — Assessment & Plan Note (Signed)
Please take Vitamin D 50000 units weekly for 12 weeks, then plan to change to OTC Vitamin D3 at 2000 units per day, indefinitely. 

## 2019-06-02 NOTE — Patient Instructions (Signed)
Your repeat BP today was a bit higher than the 170/100 initial BP  Please continue to monitor the BP at home with the goal being to be less than 140/90 (or less than 130/80 would be better), and call with the average of 2 wks of blood pressure readings in 2 wks  Please take all new medication as prescribed - the metformin, and the Vitamin D level  We have discussed the Cardiac CT Score test to measure the calcification level (if any) in your heart arteries.  This test has been ordered in our [facility], so please call [facility] directly, as they prefer this, at [phone] to be scheduled.  Please continue all other medications as before, and refills have been done if requested - the adderall  Please have the pharmacy call with any other refills you may need.  Please continue your efforts at being more active, low cholesterol diet, and weight control.  Please keep your appointments with your specialists as you may have planned  Please make an Appointment to return in 6 months, or sooner if needed

## 2019-06-19 ENCOUNTER — Encounter: Payer: Self-pay | Admitting: Internal Medicine

## 2019-08-07 ENCOUNTER — Telehealth: Payer: Self-pay | Admitting: Internal Medicine

## 2019-08-07 DIAGNOSIS — F988 Other specified behavioral and emotional disorders with onset usually occurring in childhood and adolescence: Secondary | ICD-10-CM

## 2019-08-07 MED ORDER — METFORMIN HCL ER 500 MG PO TB24: 500.0000 mg | ORAL_TABLET | Freq: Every day | ORAL | 2 refills | Status: DC

## 2019-08-07 MED ORDER — AMPHETAMINE-DEXTROAMPHETAMINE 20 MG PO TABS: ORAL_TABLET | ORAL | 0 refills | Status: DC

## 2019-08-07 NOTE — Telephone Encounter (Signed)
    1.Medication Requested: amphetamine-dextroamphetamine (ADDERALL) 20 MG tablet metFORMIN (GLUCOPHAGE-XR) 500 MG 24 hr tablet  2. Pharmacy (Name, Street, [place]): [facility] [place]  3. On Med List: yes  4. Last Visit with PCP: 06/02/19  5. Next visit date with PCP:   Agent: Please be advised that RX refills may take up to 3 business days. We ask that you follow-up with your pharmacy.

## 2019-08-07 NOTE — Telephone Encounter (Signed)
Done erx 

## 2019-08-18 ENCOUNTER — Other Ambulatory Visit: Payer: Self-pay | Admitting: Internal Medicine

## 2019-08-18 NOTE — Telephone Encounter (Signed)
Please change to OTC Vitamin D3 at 2000 units per day, indefinitely.  

## 2019-11-14 ENCOUNTER — Telehealth: Payer: Self-pay | Admitting: Internal Medicine

## 2019-11-14 DIAGNOSIS — F988 Other specified behavioral and emotional disorders with onset usually occurring in childhood and adolescence: Secondary | ICD-10-CM

## 2019-11-14 NOTE — Telephone Encounter (Signed)
    1.Medication Requested:  amphetamine-dextroamphetamine (ADDERALL) 20 MG tablet conjugated estrogens (PREMARIN) vaginal cream  2. Pharmacy (Name, [place], City):[facility] [place] [facility], [place]  3. On Med List: yes  4. Last Visit with PCP: 06/02/19 5. Next visit date with PCP: 12/20/19   Agent: Please be advised that RX refills may take up to 3 business days. We ask that you follow-up with your pharmacy.

## 2019-11-15 MED ORDER — AMPHETAMINE-DEXTROAMPHETAMINE 20 MG PO TABS: ORAL_TABLET | ORAL | 0 refills | Status: DC

## 2019-11-15 MED ORDER — PREMARIN 0.625 MG/GM VA CREA: 1.0000 | TOPICAL_CREAM | Freq: Every day | VAGINAL | 12 refills | Status: DC

## 2019-11-15 NOTE — Telephone Encounter (Signed)
Both done erx 

## 2019-11-15 NOTE — Telephone Encounter (Signed)
Sent to Dr. John. 

## 2019-12-08 ENCOUNTER — Encounter: Payer: Self-pay | Admitting: Gastroenterology

## 2019-12-20 ENCOUNTER — Other Ambulatory Visit: Payer: Self-pay

## 2019-12-20 ENCOUNTER — Ambulatory Visit (INDEPENDENT_AMBULATORY_CARE_PROVIDER_SITE_OTHER): Payer: Managed Care, Other (non HMO) | Admitting: Internal Medicine

## 2019-12-20 ENCOUNTER — Encounter: Payer: Self-pay | Admitting: Internal Medicine

## 2019-12-20 VITALS — BP 150/90 | HR 92 | Temp 98.0°F | Ht 66.0 in | Wt 144.0 lb

## 2019-12-20 DIAGNOSIS — E559 Vitamin D deficiency, unspecified: Secondary | ICD-10-CM

## 2019-12-20 DIAGNOSIS — E785 Hyperlipidemia, unspecified: Secondary | ICD-10-CM | POA: Diagnosis not present

## 2019-12-20 DIAGNOSIS — F988 Other specified behavioral and emotional disorders with onset usually occurring in childhood and adolescence: Secondary | ICD-10-CM

## 2019-12-20 DIAGNOSIS — E538 Deficiency of other specified B group vitamins: Secondary | ICD-10-CM

## 2019-12-20 DIAGNOSIS — Z0000 Encounter for general adult medical examination without abnormal findings: Secondary | ICD-10-CM | POA: Diagnosis not present

## 2019-12-20 DIAGNOSIS — R739 Hyperglycemia, unspecified: Secondary | ICD-10-CM

## 2019-12-20 MED ORDER — METFORMIN HCL ER 500 MG PO TB24: 500.0000 mg | ORAL_TABLET | Freq: Every day | ORAL | 3 refills | Status: DC

## 2019-12-20 MED ORDER — AMPHETAMINE-DEXTROAMPHETAMINE 20 MG PO TABS: ORAL_TABLET | ORAL | 0 refills | Status: DC

## 2019-12-20 NOTE — Patient Instructions (Addendum)
We have discussed the Cardiac CT Score test to measure the calcification level (if any) in your heart arteries.  This test has been ordered in our [facility], so please call [facility] directly, as they prefer this, at [phone] to be scheduled.  Please continue all other medications as before, and refills have been done if requested.  Please have the pharmacy call with any other refills you may need.  Please continue your efforts at being more active, low cholesterol diet, and weight control.  You are otherwise up to date with prevention measures today.  Please keep your appointments with your specialists as you may have planned  Please go to the LAB at the blood drawing area for the tests to be done  You will be contacted by phone if any changes need to be made immediately.  Otherwise, you will receive a letter about your results with an explanation, but please check with MyChart first.  Please remember to sign up for MyChart if you have not done so, as this will be important to you in the future with finding out test results, communicating by private email, and scheduling acute appointments online when needed.  Please make an Appointment to return in 6 months, or sooner if needed

## 2019-12-20 NOTE — Progress Notes (Signed)
Subjective:    Patient ID: Robin Farley, female    DOB: 07-16-1965, 54 y.o.   MRN: [id number]  HPI   Here for wellness and f/u;  Overall doing ok;  Pt denies Chest pain, worsening SOB, DOE, wheezing, orthopnea, PND, worsening LE edema, palpitations, dizziness or syncope.  Pt denies neurological change such as new headache, facial or extremity weakness.  Pt denies polydipsia, polyuria, or low sugar symptoms. Pt states overall good compliance with treatment and medications, good tolerability, and has been trying to follow appropriate diet.  Pt denies worsening depressive symptoms, suicidal ideation or panic. No fever, night sweats, wt loss, loss of appetite, or other constitutional symptoms.  Pt states good ability with ADL's, has low fall risk, home safety reviewed and adequate, no other significant changes in hearing or vision, and only occasionally active with exercise.  Add meds working well Past Medical History:  Diagnosis Date  . Abdominal pain, generalized 12/27/2008  . Acute sinusitis, unspecified 02/26/2007  . ADD 03/13/2010  . ALLERGIC RHINITIS 02/23/2007  . ANEMIA 02/05/2007  . Anxiety 11/17/2011  . ATTENTION DEFICIT DISORDER, HX OF 02/05/2007  . DEPRESSION 02/23/2007  . Depression 02/18/2011  . Deviated nasal septum 02/05/2007  . FATIGUE 02/23/2007  . GERD 12/27/2008  . HYPERLIPIDEMIA 02/23/2007  . SWELLING MASS OR LUMP IN HEAD AND NECK 07/27/2008  . UTI 02/23/2007   Past Surgical History:  Procedure Laterality Date  . BROW LIFT    . CESAREAN SECTION    . MELANOMA EXCISION WITH SENTINEL LYMPH NODE BIOPSY Left 10/24/2012   Procedure: MELANOMA wide EXCISION left lateral thigh WITH SENTINEL LYMPH NODE BIOPSY left groin;  Surgeon: Edward Jolly, MD;  Location: [facility];  Service: General;  Laterality: Left;  . NASAL SEPTUM SURGERY      reports that she has never smoked. She has never used smokeless tobacco. She reports current alcohol use. She reports that she  does not use drugs. family history includes Alcohol abuse in an other family member; Atrial fibrillation in her father and sister; COPD in her father; Depression in an other family member; Glaucoma in an other family member; Hypertension in her mother. Allergies  Allergen Reactions  . Promethazine Hcl     Legs twitch   Current Outpatient Medications on File Prior to Visit  Medication Sig Dispense Refill  . aspirin 81 MG EC tablet Take 81 mg by mouth daily.      Marland Kitchen conjugated estrogens (PREMARIN) vaginal cream Place 1 Applicatorful vaginally daily. 42.5 g 12  . diclofenac sodium (VOLTAREN) 1 % GEL Apply 2 g topically 4 (four) times daily as needed. 100 g 5  . Emollient (ROC RETINOL CORREXION EYE) CREA Use as directed daily 15 mL 11  . atorvastatin (LIPITOR) 20 MG tablet Take 1 tablet (20 mg total) by mouth daily. 90 tablet 3   No current facility-administered medications on file prior to visit.   Review of Systems All otherwise neg per pt    Objective:   Physical Exam BP (!) 150/90 (BP Location: Left Arm, Patient Position: Sitting, Cuff Size: Large)   Pulse 92   Temp 98 F (36.7 C) (Oral)   Ht 5\' 6"  (1.676 m)   Wt 144 lb (65.3 kg)   SpO2 98%   BMI 23.24 kg/m  VS noted,  Constitutional: Pt appears in NAD HENT: Head: NCAT.  Right Ear: External ear normal.  Left Ear: External ear normal.  Eyes: . Pupils are equal, round, and reactive  to light. Conjunctivae and EOM are normal Nose: without d/c or deformity Neck: Neck supple. Gross normal ROM Cardiovascular: Normal rate and regular rhythm.   Pulmonary/Chest: Effort normal and breath sounds without rales or wheezing.  Abd:  Soft, NT, ND, + BS, no organomegaly Neurological: Pt is alert. At baseline orientation, motor grossly intact Skin: Skin is warm. No rashes, other new lesions, no LE edema Psychiatric: Pt behavior is normal without agitation  All otherwise neg per pt Lab Results  Component Value Date   WBC 4.3 12/20/2019    HGB 14.1 12/20/2019   HCT 41.7 12/20/2019   PLT 290 12/20/2019   GLUCOSE 98 12/16/2018   CHOL 277 (H) 12/20/2019   TRIG 99 12/20/2019   HDL 86 12/20/2019   LDLDIRECT 159.0 08/21/2014   LDLCALC 169 (H) 12/20/2019   ALT 12 12/20/2019   AST 14 12/20/2019   NA 141 12/16/2018   K 4.0 12/16/2018   CL 104 12/16/2018   CREATININE 0.83 12/16/2018   BUN 14 12/16/2018   CO2 29 12/16/2018   TSH 2.01 12/20/2019   HGBA1C 5.0 12/20/2019      Assessment & Plan:

## 2019-12-21 ENCOUNTER — Other Ambulatory Visit: Payer: Self-pay | Admitting: Internal Medicine

## 2019-12-21 ENCOUNTER — Encounter: Payer: Self-pay | Admitting: Internal Medicine

## 2019-12-21 LAB — LIPID PANEL
Cholesterol: 277 mg/dL — ABNORMAL HIGH (ref <200)
HDL: 86 mg/dL (ref >=50)
LDL Cholesterol (Calc): 169 mg/dL (calc) — ABNORMAL HIGH
Non-HDL Cholesterol (Calc): 191 mg/dL (calc) — ABNORMAL HIGH (ref <130)
Total CHOL/HDL Ratio: 3.2 (calc) (ref <5.0)
Triglycerides: 99 mg/dL (ref <150)

## 2019-12-21 LAB — CBC WITH DIFFERENTIAL/PLATELET
Absolute Monocytes: 331 cells/uL (ref 200–950)
Basophils Absolute: 22 cells/uL (ref 0–200)
Basophils Relative: 0.5 %
Eosinophils Absolute: 39 cells/uL (ref 15–500)
Eosinophils Relative: 0.9 %
HCT: 41.7 % (ref 35.0–45.0)
Hemoglobin: 14.1 g/dL (ref 11.7–15.5)
Lymphs Abs: 1329 cells/uL (ref 850–3900)
MCH: 30.7 pg (ref 27.0–33.0)
MCHC: 33.8 g/dL (ref 32.0–36.0)
MCV: 90.7 fL (ref 80.0–100.0)
MPV: 10.1 fL (ref 7.5–12.5)
Monocytes Relative: 7.7 %
Neutro Abs: 2580 cells/uL (ref 1500–7800)
Neutrophils Relative %: 60 %
Platelets: 290 10*3/uL (ref 140–400)
RBC: 4.6 10*6/uL (ref 3.80–5.10)
RDW: 12.5 % (ref 11.0–15.0)
Total Lymphocyte: 30.9 %
WBC: 4.3 10*3/uL (ref 3.8–10.8)

## 2019-12-21 LAB — URINALYSIS, ROUTINE W REFLEX MICROSCOPIC
Bilirubin Urine: NEGATIVE
Glucose, UA: NEGATIVE
Hgb urine dipstick: NEGATIVE
Ketones, ur: NEGATIVE
Leukocytes,Ua: NEGATIVE
Nitrite: NEGATIVE
Protein, ur: NEGATIVE
Specific Gravity, Urine: 1.017 (ref 1.001–1.03)
pH: 6.5 (ref 5.0–8.0)

## 2019-12-21 LAB — HEMOGLOBIN A1C
Hgb A1c MFr Bld: 5 % of total Hgb (ref <5.7)
Mean Plasma Glucose: 97 (calc)
eAG (mmol/L): 5.4 (calc)

## 2019-12-21 LAB — HEPATIC FUNCTION PANEL
AG Ratio: 1.7 (calc) (ref 1.0–2.5)
ALT: 12 U/L (ref 6–29)
AST: 14 U/L (ref 10–35)
Albumin: 4.4 g/dL (ref 3.6–5.1)
Alkaline phosphatase (APISO): 73 U/L (ref 37–153)
Bilirubin, Direct: 0.1 mg/dL (ref 0.0–0.2)
Globulin: 2.6 g/dL (calc) (ref 1.9–3.7)
Indirect Bilirubin: 0.4 mg/dL (calc) (ref 0.2–1.2)
Total Bilirubin: 0.5 mg/dL (ref 0.2–1.2)
Total Protein: 7 g/dL (ref 6.1–8.1)

## 2019-12-21 LAB — VITAMIN B12: Vitamin B-12: 416 pg/mL (ref 200–1100)

## 2019-12-21 LAB — TSH: TSH: 2.01 mIU/L (ref 0.40–4.50)

## 2019-12-21 LAB — VITAMIN D 25 HYDROXY (VIT D DEFICIENCY, FRACTURES): Vit D, 25-Hydroxy: 25 ng/mL — ABNORMAL LOW (ref 30–100)

## 2019-12-21 MED ORDER — VITAMIN D (ERGOCALCIFEROL) 1.25 MG (50000 UNIT) PO CAPS: 50000.0000 [IU] | ORAL_CAPSULE | ORAL | 0 refills | Status: DC

## 2019-12-23 ENCOUNTER — Encounter: Payer: Self-pay | Admitting: Internal Medicine

## 2019-12-23 NOTE — Assessment & Plan Note (Signed)
Cont oral replacement 

## 2019-12-23 NOTE — Assessment & Plan Note (Signed)
stable overall by history and exam, recent data reviewed with pt, and pt to continue medical treatment as before,  to f/u any worsening symptoms or concerns  

## 2019-12-23 NOTE — Assessment & Plan Note (Signed)
Cor cardiac ct score

## 2019-12-23 NOTE — Assessment & Plan Note (Signed)

## 2020-01-11 ENCOUNTER — Encounter: Payer: Self-pay | Admitting: Internal Medicine

## 2020-03-10 ENCOUNTER — Other Ambulatory Visit: Payer: Self-pay | Admitting: Internal Medicine

## 2020-03-10 NOTE — Telephone Encounter (Signed)
Please change to OTC Vitamin D3 at 2000 units per day, indefinitely.  

## 2020-05-09 ENCOUNTER — Telehealth: Payer: Self-pay | Admitting: Internal Medicine

## 2020-05-09 DIAGNOSIS — F988 Other specified behavioral and emotional disorders with onset usually occurring in childhood and adolescence: Secondary | ICD-10-CM

## 2020-05-09 MED ORDER — METFORMIN HCL ER 500 MG PO TB24
500.0000 mg | ORAL_TABLET | Freq: Every day | ORAL | 3 refills | Status: DC
Start: 2020-05-09 — End: 2020-09-18

## 2020-05-09 MED ORDER — AMPHETAMINE-DEXTROAMPHETAMINE 20 MG PO TABS: ORAL_TABLET | ORAL | 0 refills | Status: DC

## 2020-05-09 NOTE — Telephone Encounter (Signed)
metFORMIN (GLUCOPHAGE-XR) 500 MG 24 hr tablet  amphetamine-dextroamphetamine (ADDERALL) 20 MG tablet Patient requesting refill  [facility] [id number] [place] [address] [place] Phone:  [phone]  Fax:  [phone]     Last seen- 09.08.21 Next apt- 02.28.22

## 2020-06-03 ENCOUNTER — Encounter: Payer: Managed Care, Other (non HMO) | Admitting: Internal Medicine

## 2020-06-10 ENCOUNTER — Ambulatory Visit: Payer: Managed Care, Other (non HMO) | Admitting: Internal Medicine

## 2020-06-10 ENCOUNTER — Other Ambulatory Visit: Payer: Self-pay

## 2020-06-10 ENCOUNTER — Encounter: Payer: Self-pay | Admitting: Internal Medicine

## 2020-06-10 VITALS — BP 130/82 | HR 73 | Ht 66.0 in | Wt 147.0 lb

## 2020-06-10 DIAGNOSIS — E559 Vitamin D deficiency, unspecified: Secondary | ICD-10-CM | POA: Diagnosis not present

## 2020-06-10 DIAGNOSIS — R739 Hyperglycemia, unspecified: Secondary | ICD-10-CM

## 2020-06-10 DIAGNOSIS — R030 Elevated blood-pressure reading, without diagnosis of hypertension: Secondary | ICD-10-CM

## 2020-06-10 DIAGNOSIS — Z0000 Encounter for general adult medical examination without abnormal findings: Secondary | ICD-10-CM | POA: Diagnosis not present

## 2020-06-10 DIAGNOSIS — E7800 Pure hypercholesterolemia, unspecified: Secondary | ICD-10-CM | POA: Diagnosis not present

## 2020-06-10 DIAGNOSIS — F988 Other specified behavioral and emotional disorders with onset usually occurring in childhood and adolescence: Secondary | ICD-10-CM | POA: Diagnosis not present

## 2020-06-10 DIAGNOSIS — Z0001 Encounter for general adult medical examination with abnormal findings: Secondary | ICD-10-CM

## 2020-06-10 MED ORDER — ATORVASTATIN CALCIUM 20 MG PO TABS: 20.0000 mg | ORAL_TABLET | Freq: Every day | ORAL | 3 refills | Status: DC

## 2020-06-10 MED ORDER — THERA-D 2000 50 MCG (2000 UT) PO TABS: ORAL_TABLET | ORAL | 99 refills | Status: DC

## 2020-06-10 MED ORDER — AMPHETAMINE-DEXTROAMPHETAMINE 20 MG PO TABS: ORAL_TABLET | ORAL | 0 refills | Status: DC

## 2020-06-10 NOTE — Progress Notes (Signed)
Patient ID: Robin Farley, female   DOB: 03-25-1966, 55 y.o.   MRN: [id number]         Chief Complaint:: wellness exam and Follow-up  ADD, elevated BP, anxiety, htn, hld       HPI:  Robin Farley is a 55 y.o. female here for wellness exam; due for pap and mammogram  -  plans to see Dr Ronita Hipps soon.  Due for covid booster soon and plans to get this.  Also plans to have colonscopy soon by re-scheduling.                        Also overall o/w doing well.  ADD med working well with task completion.     Wt Readings from Last 3 Encounters:  06/10/20 147 lb (66.7 kg)  12/20/19 144 lb (65.3 kg)  06/02/19 154 lb (69.9 kg)   BP Readings from Last 3 Encounters:  06/10/20 130/82  12/20/19 (!) 150/90  06/02/19 (!) 170/100   Immunization History  Administered Date(s) Administered   Influenza Whole 01/25/2008   PFIZER(Purple Top)SARS-COV-2 Vaccination 11/04/2019, 12/01/2019   Td 04/13/1998, 03/13/2010   Health Maintenance Due  Topic Date Due   COLONOSCOPY (Pts 45-79yrs Insurance coverage will need to be confirmed)  01/08/2020      Past Medical History:  Diagnosis Date   Abdominal pain, generalized 12/27/2008   Acute sinusitis, unspecified 02/26/2007   ADD 03/13/2010   ALLERGIC RHINITIS 02/23/2007   ANEMIA 02/05/2007   Anxiety 11/17/2011   ATTENTION DEFICIT DISORDER, HX OF 02/05/2007   DEPRESSION 02/23/2007   Depression 02/18/2011   Deviated nasal septum 02/05/2007   FATIGUE 02/23/2007   GERD 12/27/2008   HYPERLIPIDEMIA 02/23/2007   SWELLING MASS OR LUMP IN HEAD AND NECK 07/27/2008   UTI 02/23/2007   Past Surgical History:  Procedure Laterality Date   BROW LIFT     CESAREAN SECTION     MELANOMA EXCISION WITH SENTINEL LYMPH NODE BIOPSY Left 10/24/2012   Procedure: MELANOMA wide EXCISION left lateral thigh WITH SENTINEL LYMPH NODE BIOPSY left groin;  Surgeon: Edward Jolly, MD;  Location: [place];  Service: General;  Laterality: Left;    NASAL SEPTUM SURGERY      reports that she has never smoked. She has never used smokeless tobacco. She reports current alcohol use. She reports that she does not use drugs. family history includes Alcohol abuse in an other family member; Atrial fibrillation in her father and sister; COPD in her father; Depression in an other family member; Glaucoma in an other family member; Hypertension in her mother. Allergies  Allergen Reactions   Promethazine Hcl     Legs twitch   Current Outpatient Medications on File Prior to Visit  Medication Sig Dispense Refill   aspirin 81 MG EC tablet Take 81 mg by mouth daily.     conjugated estrogens (PREMARIN) vaginal cream Place 1 Applicatorful vaginally daily. 42.5 g 12   diclofenac sodium (VOLTAREN) 1 % GEL Apply 2 g topically 4 (four) times daily as needed. 100 g 5   Emollient (ROC RETINOL CORREXION EYE) CREA Use as directed daily 15 mL 11   metFORMIN (GLUCOPHAGE-XR) 500 MG 24 hr tablet Take 1 tablet (500 mg total) by mouth daily with breakfast. 90 tablet 3   No current facility-administered medications on file prior to visit.        ROS:  All others reviewed and negative.  Objective  PE:  BP 130/82    Pulse 73    Ht 5\' 6"  (1.676 m)    Wt 147 lb (66.7 kg)    SpO2 99%    BMI 23.73 kg/m                 Constitutional: Pt appears in NAD               HENT: Head: NCAT.                Right Ear: External ear normal.                 Left Ear: External ear normal.                Eyes: . Pupils are equal, round, and reactive to light. Conjunctivae and EOM are normal               Nose: without d/c or deformity               Neck: Neck supple. Gross normal ROM               Cardiovascular: Normal rate and regular rhythm.                 Pulmonary/Chest: Effort normal and breath sounds without rales or wheezing.                Abd:  Soft, NT, ND, + BS, no organomegaly               Neurological: Pt is alert. At baseline orientation, motor  grossly intact               Skin: Skin is warm. No rashes, no other new lesions, LE edema - none               Psychiatric: Pt behavior is normal without agitation   Micro: none  Cardiac tracings I have personally interpreted today:  none  Pertinent Radiological findings (summarize): none   Lab Results  Component Value Date   WBC 4.3 12/20/2019   HGB 14.1 12/20/2019   HCT 41.7 12/20/2019   PLT 290 12/20/2019   GLUCOSE 98 12/16/2018   CHOL 277 (H) 12/20/2019   TRIG 99 12/20/2019   HDL 86 12/20/2019   LDLDIRECT 159.0 08/21/2014   LDLCALC 169 (H) 12/20/2019   ALT 12 12/20/2019   AST 14 12/20/2019   NA 141 12/16/2018   K 4.0 12/16/2018   CL 104 12/16/2018   CREATININE 0.83 12/16/2018   BUN 14 12/16/2018   CO2 29 12/16/2018   TSH 2.01 12/20/2019   HGBA1C 5.0 12/20/2019   Assessment/Plan:  Robin Farley is a 55 y.o. White or Caucasian [1] female with  has a past medical history of Abdominal pain, generalized (12/27/2008), Acute sinusitis, unspecified (02/26/2007), ADD (03/13/2010), ALLERGIC RHINITIS (02/23/2007), ANEMIA (02/05/2007), Anxiety (11/17/2011), ATTENTION DEFICIT DISORDER, HX OF (02/05/2007), DEPRESSION (02/23/2007), Depression (02/18/2011), Deviated nasal septum (02/05/2007), FATIGUE (02/23/2007), GERD (12/27/2008), HYPERLIPIDEMIA (02/23/2007), SWELLING MASS OR LUMP IN HEAD AND NECK (07/27/2008), and UTI (02/23/2007).  Encounter for well adult exam with abnormal findings Age and sex appropriate education and counseling updated with regular exercise and diet Referrals for preventative services - pt will call for pap and mammogram, and will call to reschedule her colonoscopy Immunizations addressed - pt to arrange covid booster Smoking counseling  - none needed Evidence for depression or other mood disorder - none significant Most recent labs reviewed. I  have personally reviewed and have noted: 1) the patient's medical and social history 2) The patient's current medications  and supplements 3) The patient's height, weight, and BMI have been recorded in the chart   Attention deficit disorder Stable, to cont current adderall  Blood pressure elevated without history of HTN BP Readings from Last 3 Encounters:  06/10/20 130/82  12/20/19 (!) 150/90  06/02/19 (!) 170/100   Stable, pt to continue medical treatment  - none, declines low dose arb   Hyperglycemia Lab Results  Component Value Date   HGBA1C 5.0 12/20/2019   Stable, pt to continue current medical treatment metformin  Current Outpatient Medications (Endocrine & Metabolic):    metFORMIN (GLUCOPHAGE-XR) 500 MG 24 hr tablet, Take 1 tablet (500 mg total) by mouth daily with breakfast.  Current Outpatient Medications (Cardiovascular):    atorvastatin (LIPITOR) 20 MG tablet, Take 1 tablet (20 mg total) by mouth daily.   Current Outpatient Medications (Analgesics):    aspirin 81 MG EC tablet, Take 81 mg by mouth daily.   Current Outpatient Medications (Other):    Cholecalciferol (THERA-D 2000) 50 MCG (2000 UT) TABS, 1 tab by mouth once daily   conjugated estrogens (PREMARIN) vaginal cream, Place 1 Applicatorful vaginally daily.   diclofenac sodium (VOLTAREN) 1 % GEL, Apply 2 g topically 4 (four) times daily as needed.   Emollient (ROC RETINOL CORREXION EYE) CREA, Use as directed daily   amphetamine-dextroamphetamine (ADDERALL) 20 MG tablet, Take 1 and 1/2 tablet by mouth twice per day   Hyperlipidemia Lab Results  Component Value Date   LDLCALC 169 (H) 12/20/2019   Stable, pt to continue current statin lipitor 20   Vitamin D deficiency Last vitamin D Lab Results  Component Value Date   VD25OH 25 (L) 12/20/2019   Low, to start vit d3 2000 oral replacement   Followup: Return in about 1 year (around 06/10/2021).  Cathlean Cower, MD 06/10/2020 8:02 PM [facility]

## 2020-06-10 NOTE — Assessment & Plan Note (Signed)
BP Readings from Last 3 Encounters:  06/10/20 130/82  12/20/19 (!) 150/90  06/02/19 (!) 170/100   Stable, pt to continue medical treatment  - none, declines low dose arb

## 2020-06-10 NOTE — Assessment & Plan Note (Signed)
Lab Results  Component Value Date   LDLCALC 169 (H) 12/20/2019   Stable, pt to continue current statin lipitor 20

## 2020-06-10 NOTE — Assessment & Plan Note (Signed)
Stable, to cont current adderall

## 2020-06-10 NOTE — Assessment & Plan Note (Signed)
Last vitamin D Lab Results  Component Value Date   VD25OH 25 (L) 12/20/2019   Low, to start vit d3 2000 oral replacement

## 2020-06-10 NOTE — Assessment & Plan Note (Signed)
Age and sex appropriate education and counseling updated with regular exercise and diet Referrals for preventative services - pt will call for pap and mammogram, and will call to reschedule her colonoscopy Immunizations addressed - pt to arrange covid booster Smoking counseling  - none needed Evidence for depression or other mood disorder - none significant Most recent labs reviewed. I have personally reviewed and have noted: 1) the patient's medical and social history 2) The patient's current medications and supplements 3) The patient's height, weight, and BMI have been recorded in the chart

## 2020-06-10 NOTE — Assessment & Plan Note (Signed)
Lab Results  Component Value Date   HGBA1C 5.0 12/20/2019   Stable, pt to continue current medical treatment metformin  Current Outpatient Medications (Endocrine & Metabolic):  .  metFORMIN (GLUCOPHAGE-XR) 500 MG 24 hr tablet, Take 1 tablet (500 mg total) by mouth daily with breakfast.  Current Outpatient Medications (Cardiovascular):  .  atorvastatin (LIPITOR) 20 MG tablet, Take 1 tablet (20 mg total) by mouth daily.   Current Outpatient Medications (Analgesics):  .  aspirin 81 MG EC tablet, Take 81 mg by mouth daily.   Current Outpatient Medications (Other):  Marland Kitchen  Cholecalciferol (THERA-D 2000) 50 MCG (2000 UT) TABS, 1 tab by mouth once daily .  conjugated estrogens (PREMARIN) vaginal cream, Place 1 Applicatorful vaginally daily. .  diclofenac sodium (VOLTAREN) 1 % GEL, Apply 2 g topically 4 (four) times daily as needed. .  Emollient (ROC RETINOL CORREXION EYE) CREA, Use as directed daily .  amphetamine-dextroamphetamine (ADDERALL) 20 MG tablet, Take 1 and 1/2 tablet by mouth twice per day

## 2020-06-10 NOTE — Patient Instructions (Signed)
Please take all new medication as prescribed - the lipitor  Please continue all other medications as before, and refills have been done if requested - the adderall  Please have the pharmacy call with any other refills you may need.  Please continue your efforts at being more active, low cholesterol diet, and weight control.  You are otherwise up to date with prevention measures today.  Please keep your appointments with your specialists as you may have planned  Please make an Appointment to return for your 1 year visit, or sooner if needed

## 2020-06-20 ENCOUNTER — Ambulatory Visit: Payer: Managed Care, Other (non HMO) | Admitting: Internal Medicine

## 2020-08-27 ENCOUNTER — Encounter: Payer: Self-pay | Admitting: Internal Medicine

## 2020-09-18 ENCOUNTER — Other Ambulatory Visit: Payer: Self-pay

## 2020-09-18 MED ORDER — METFORMIN HCL ER 500 MG PO TB24: 500.0000 mg | ORAL_TABLET | Freq: Every day | ORAL | 3 refills | Status: DC

## 2020-10-17 ENCOUNTER — Other Ambulatory Visit: Payer: Self-pay | Admitting: Obstetrics and Gynecology

## 2020-10-17 DIAGNOSIS — N63 Unspecified lump in unspecified breast: Secondary | ICD-10-CM

## 2020-11-06 ENCOUNTER — Ambulatory Visit
Admission: RE | Admit: 2020-11-06 | Discharge: 2020-11-06 | Disposition: A | Discharge status: Home / Self Care | Payer: Managed Care, Other (non HMO) | Source: Ambulatory Visit | Attending: Obstetrics and Gynecology | Admitting: Obstetrics and Gynecology

## 2020-11-06 ENCOUNTER — Other Ambulatory Visit: Payer: Self-pay | Admitting: Obstetrics and Gynecology

## 2020-11-06 ENCOUNTER — Other Ambulatory Visit: Payer: Self-pay

## 2020-11-06 DIAGNOSIS — N63 Unspecified lump in unspecified breast: Secondary | ICD-10-CM

## 2020-11-08 ENCOUNTER — Ambulatory Visit
Admission: RE | Admit: 2020-11-08 | Discharge: 2020-11-08 | Disposition: A | Discharge status: Home / Self Care | Payer: Managed Care, Other (non HMO) | Source: Ambulatory Visit | Attending: Obstetrics and Gynecology | Admitting: Obstetrics and Gynecology

## 2020-11-08 ENCOUNTER — Other Ambulatory Visit: Payer: Self-pay

## 2020-11-08 DIAGNOSIS — N63 Unspecified lump in unspecified breast: Secondary | ICD-10-CM

## 2020-11-12 ENCOUNTER — Encounter: Payer: Self-pay | Admitting: *Deleted

## 2020-11-12 ENCOUNTER — Telehealth: Payer: Self-pay | Admitting: *Deleted

## 2020-11-12 DIAGNOSIS — C50411 Malignant neoplasm of upper-outer quadrant of right female breast: Secondary | ICD-10-CM | POA: Insufficient documentation

## 2020-11-12 DIAGNOSIS — Z170 Estrogen receptor positive status [ER+]: Secondary | ICD-10-CM

## 2020-11-12 DIAGNOSIS — Z171 Estrogen receptor negative status [ER-]: Secondary | ICD-10-CM | POA: Insufficient documentation

## 2020-11-12 NOTE — Telephone Encounter (Signed)
Spoke with patient to discuss [facility] clinic.  Appt. Confirmed for 11/20/20 at 8:15am. Will mail and email packet.

## 2020-11-18 ENCOUNTER — Encounter: Payer: Self-pay | Admitting: *Deleted

## 2020-11-19 NOTE — Progress Notes (Signed)
Radiation Oncology         [phone] ________________________________  Initial Outpatient Consultation  Name: Robin Farley MRN: [id number]  Date: 11/20/2020  DOB: Sep 08, 1965  BS:WHQP, Hunt Oris, MD  Stark Klein, MD   REFERRING PHYSICIAN: Stark Klein, MD  DIAGNOSIS:    ICD-10-CM   1. Malignant neoplasm of upper-outer quadrant of right breast in female, estrogen receptor positive ([place])  C50.411    Z17.0     Cancer Staging Malignant neoplasm of upper-outer quadrant of right breast in female, estrogen receptor positive ([place]) Staging form: Breast, AJCC 8th Edition - Clinical: Stage IIB (cT1c, cN1, cM0, G3, ER-, PR-, HER2-) - Signed by [name], MD on 11/20/2020 Histologic grading system: 3 grade system  Stage IIB (cT1c, cN1, cM0) Right Breast UOQ Invasive Ductal Carcinoma, ER- / PR- / Her2-, Grade 3  CHIEF COMPLAINT: Here to discuss management of right breast cancer  HISTORY OF PRESENT ILLNESS::Robin Farley is a 55 y.o. female who presented with right breast abnormality on the following imaging: diagnostic bilateral mammogram and right breast ultrasound on the date of 11/06/20.  Imaging revealed a highly suspicious mass in the right breast at the 10 o'clock position measuring 1.9 cm; a suspicious smaller mass in the right breast at the 9 o'clock position measuring 0.8 cm; and a small mass in the right axilla measuring 0.7 cm (with an additional second mildly suspicious node in the axilla). Symptoms, if any, at that time were: a palpable right breast lump.  Right breast biopsies on date of 11/08/20 showed invasive ductal carcinoma at the 10 o'clock position; fibrocystic changes without carcinoma from biopsy at the 9 o'clock position; and metastatic carcinoma involving one right axillary lymph node. Carcinoma noted to measure 1.3 cm in the greatest linear dimension. ER status: 5%, positive, with weak staining intensity; PR status: 0%, negative; Her2 status negative; Ki67:  30%; Grade 3.  She is here with her significant other and daughter today.  She is living in [place] but they have transferred to [facility] for her treatment.  Her daughter goes to [employer] and is majoring in psychology.    She is interested in ways to optimize her nutrition.  She has been juicing  PREVIOUS RADIATION THERAPY: No  PAST MEDICAL HISTORY:  has a past medical history of Abdominal pain, generalized (12/27/2008), Acute sinusitis, unspecified (02/26/2007), ADD (03/13/2010), ALLERGIC RHINITIS (02/23/2007), ANEMIA (02/05/2007), Anxiety (11/17/2011), ATTENTION DEFICIT DISORDER, HX OF (02/05/2007), Breast cancer ([place]), DEPRESSION (02/23/2007), Depression (02/18/2011), Deviated nasal septum (02/05/2007), FATIGUE (02/23/2007), GERD (12/27/2008), HYPERLIPIDEMIA (02/23/2007), SWELLING MASS OR LUMP IN HEAD AND NECK (07/27/2008), and UTI (02/23/2007).    PAST SURGICAL HISTORY: Past Surgical History:  Procedure Laterality Date   BROW LIFT     CESAREAN SECTION     COSMETIC SURGERY  1995   Nasal   MELANOMA EXCISION WITH SENTINEL LYMPH NODE BIOPSY Left 10/24/2012   Procedure: MELANOMA wide EXCISION left lateral thigh WITH SENTINEL LYMPH NODE BIOPSY left groin;  Surgeon: Edward Jolly, MD;  Location: [facility];  Service: General;  Laterality: Left;   NASAL SEPTUM SURGERY      FAMILY HISTORY: family history includes Alcohol abuse in an other family member; Atrial fibrillation in her father and sister; Brain cancer in her paternal grandfather; Breast cancer in her mother and another family member; COPD in her father; Depression in an other family member; Glaucoma in an other family member; Hypertension in her mother; Melanoma in her cousin.  SOCIAL HISTORY:  reports that she has never smoked. She has never used smokeless tobacco. She reports current alcohol use. She reports that she does not use drugs.  ALLERGIES: Promethazine hcl  MEDICATIONS:  Current  Outpatient Medications  Medication Sig Dispense Refill   amphetamine-dextroamphetamine (ADDERALL) 20 MG tablet Take 1 and 1/2 tablet by mouth twice per day 90 tablet 0   aspirin 81 MG EC tablet Take 81 mg by mouth daily.     atorvastatin (LIPITOR) 20 MG tablet Take 1 tablet (20 mg total) by mouth daily. 90 tablet 3   Cholecalciferol (THERA-D 2000) 50 MCG (2000 UT) TABS 1 tab by mouth once daily 30 tablet 99   conjugated estrogens (PREMARIN) vaginal cream Place 1 Applicatorful vaginally daily. 42.5 g 12   diclofenac sodium (VOLTAREN) 1 % GEL Apply 2 g topically 4 (four) times daily as needed. 100 g 5   Emollient (ROC RETINOL CORREXION EYE) CREA Use as directed daily 15 mL 11   metFORMIN (GLUCOPHAGE-XR) 500 MG 24 hr tablet Take 1 tablet (500 mg total) by mouth daily with breakfast. 90 tablet 3   No current facility-administered medications for this encounter.    REVIEW OF SYSTEMS: As above   PHYSICAL EXAM:   Vitals with BMI 11/20/2020  Height 5' 6"  Weight 154 lbs 5 oz  BMI 09.81  Systolic 191  Diastolic 69  Pulse 89   General: Alert and oriented, in no acute distress Heart: Regular in rate and rhythm with no murmurs, rubs, or gallops. Chest: Clear to auscultation bilaterally, with no rhonchi, wheezes, or rales. Abdomen: Soft, nontender, nondistended, with no rigidity or guarding. Extremities: No cyanosis or edema. Skin: No concerning lesions. Musculoskeletal: symmetric strength and muscle tone throughout. Neurologic: Cranial nerves II through XII are grossly intact. No obvious focalities. Speech is fluent. Coordination is intact. Psychiatric: Judgment and insight are intact. Affect is appropriate. Breasts: Upper outer quadrant of right breast demonstrates erythema of the skin and a firm 3 cm mass. No other palpable masses appreciated in the breasts or axillae bilaterally.  ECOG = 0  0 - Asymptomatic (Fully active, able to carry on all predisease activities without  restriction)  1 - Symptomatic but completely ambulatory (Restricted in physically strenuous activity but ambulatory and able to carry out work of a light or sedentary nature. For example, light housework, office work)  2 - Symptomatic, <50% in bed during the day (Ambulatory and capable of all self care but unable to carry out any work activities. Up and about more than 50% of waking hours)  3 - Symptomatic, >50% in bed, but not bedbound (Capable of only limited self-care, confined to bed or chair 50% or more of waking hours)  4 - Bedbound (Completely disabled. Cannot carry on any self-care. Totally confined to bed or chair)  5 - Death   Eustace Pen MM, Creech RH, Tormey DC, et al. [phone]). "Toxicity and response criteria of the [facility] [employer]". [place] Oncol. 5 (6): 649-55   LABORATORY DATA:  Lab Results  Component Value Date   WBC 4.5 11/20/2020   HGB 13.5 11/20/2020   HCT 39.4 11/20/2020   MCV 90.6 11/20/2020   PLT 251 11/20/2020   CMP     Component Value Date/Time   NA 145 11/20/2020 0839   K 3.7 11/20/2020 0839   CL 108 11/20/2020 0839   CO2 26 11/20/2020 0839   GLUCOSE 71 11/20/2020 0839   BUN 19 11/20/2020 0839   CREATININE  0.92 11/20/2020 0839   CALCIUM 10.3 11/20/2020 0839   PROT 7.3 11/20/2020 0839   ALBUMIN 4.0 11/20/2020 0839   AST 21 11/20/2020 0839   ALT 25 11/20/2020 0839   ALKPHOS 79 11/20/2020 0839   BILITOT 0.4 11/20/2020 0839   GFRNONAA >60 11/20/2020 0839   GFRAA 100 03/23/2008 1212         RADIOGRAPHY: US BREAST LTD UNI RIGHT INC AXILLA  Result Date: 11/06/2020 CLINICAL DATA:  55 year old female presenting with a new lump in the right breast. Patient states the lump has been present for several months. EXAM: DIGITAL DIAGNOSTIC BILATERAL MAMMOGRAM WITH TOMOSYNTHESIS AND CAD; ULTRASOUND RIGHT BREAST LIMITED TECHNIQUE: Bilateral digital diagnostic mammography and breast tomosynthesis was performed. The images were evaluated with  computer-aided detection.; Targeted ultrasound examination of the right breast was performed COMPARISON:  Previous exam(s). ACR Breast Density Category b: There are scattered areas of fibroglandular density. FINDINGS: Mammogram: Right breast: A skin BB marks the palpable site of concern in the upper outer right breast. A spot tangential view of this area is performed in addition to standard views. There is an irregular spiculated mass with associated calcifications measuring approximately 1.7 cm. There is an additional smaller irregular mass measuring approximately 0.8 cm also in the outer breast just inferior to the above described mass. Left breast: No suspicious mass, distortion, or microcalcifications are identified to suggest presence of malignancy. On physical exam at the site of concern in the upper-outer right breast I feel a fixed discrete mass. Ultrasound: Targeted ultrasound performed at the palpable site of concern in the right breast at the 10 o'clock 7 cm from the nipple demonstrating an irregular hypoechoic mass measuring 1.9 x 1.1 x 1.7 cm. There is internal vascularity. This corresponds to the mammographic finding. At 9 o'clock 6 cm from the nipple there is an irregular hypoechoic mass measuring 0.8 x 0.4 x 0.8 cm. No internal vascularity. This corresponds to the second mass identified mammographically. Targeted ultrasound the right axilla demonstrates a small irregular hypoechoic mass labeled #1 measuring 0.6 x 0.5 x 0.7 cm, possibly a replaced lymph node. There is a lymph node with mildly thickened cortex measuring 0.5 cm labeled #2. IMPRESSION: 1. Highly suspicious mass at the palpable site of concern in the right breast at 10 o'clock measuring 1.9 cm. 2. Suspicious smaller mass in the right breast at 9 o'clock measuring 0.8 cm. 3. Suspicious small mass in the right axilla measuring 0.7 cm, possibly a replaced lymph node. There is a second mildly suspicious lymph node in the axilla.  RECOMMENDATION: Ultrasound-guided core needle biopsy x3 of the right breast mass at 10 o'clock, of the right breast mass at 9 o'clock and of the small mass in the right axilla measuring 0.7 cm. I have discussed the findings and recommendations with the patient who agrees to proceed with biopsy. The patient will be scheduled for the biopsy appointment prior to leaving the office today. BI-RADS CATEGORY  5: Highly suggestive of malignancy. Electronically Signed   By: Audie Pinto M.D.   On: 11/06/2020 13:25  MM DIAG BREAST TOMO BILATERAL  Result Date: 11/06/2020 CLINICAL DATA:  55 year old female presenting with a new lump in the right breast. Patient states the lump has been present for several months. EXAM: DIGITAL DIAGNOSTIC BILATERAL MAMMOGRAM WITH TOMOSYNTHESIS AND CAD; ULTRASOUND RIGHT BREAST LIMITED TECHNIQUE: Bilateral digital diagnostic mammography and breast tomosynthesis was performed. The images were evaluated with computer-aided detection.; Targeted ultrasound examination of the right breast was performed COMPARISON:  Previous exam(s). ACR Breast Density Category b: There are scattered areas of fibroglandular density. FINDINGS: Mammogram: Right breast: A skin BB marks the palpable site of concern in the upper outer right breast. A spot tangential view of this area is performed in addition to standard views. There is an irregular spiculated mass with associated calcifications measuring approximately 1.7 cm. There is an additional smaller irregular mass measuring approximately 0.8 cm also in the outer breast just inferior to the above described mass. Left breast: No suspicious mass, distortion, or microcalcifications are identified to suggest presence of malignancy. On physical exam at the site of concern in the upper-outer right breast I feel a fixed discrete mass. Ultrasound: Targeted ultrasound performed at the palpable site of concern in the right breast at the 10 o'clock 7 cm from the nipple  demonstrating an irregular hypoechoic mass measuring 1.9 x 1.1 x 1.7 cm. There is internal vascularity. This corresponds to the mammographic finding. At 9 o'clock 6 cm from the nipple there is an irregular hypoechoic mass measuring 0.8 x 0.4 x 0.8 cm. No internal vascularity. This corresponds to the second mass identified mammographically. Targeted ultrasound the right axilla demonstrates a small irregular hypoechoic mass labeled #1 measuring 0.6 x 0.5 x 0.7 cm, possibly a replaced lymph node. There is a lymph node with mildly thickened cortex measuring 0.5 cm labeled #2. IMPRESSION: 1. Highly suspicious mass at the palpable site of concern in the right breast at 10 o'clock measuring 1.9 cm. 2. Suspicious smaller mass in the right breast at 9 o'clock measuring 0.8 cm. 3. Suspicious small mass in the right axilla measuring 0.7 cm, possibly a replaced lymph node. There is a second mildly suspicious lymph node in the axilla. RECOMMENDATION: Ultrasound-guided core needle biopsy x3 of the right breast mass at 10 o'clock, of the right breast mass at 9 o'clock and of the small mass in the right axilla measuring 0.7 cm. I have discussed the findings and recommendations with the patient who agrees to proceed with biopsy. The patient will be scheduled for the biopsy appointment prior to leaving the office today. BI-RADS CATEGORY  5: Highly suggestive of malignancy. Electronically Signed   By: Audie Pinto M.D.   On: 11/06/2020 13:25  [place] AXILLARY NODE CORE BIOPSY RIGHT  Addendum Date: 11/12/2020   ADDENDUM REPORT: 11/11/2020 14:28 ADDENDUM: 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 This was found to be concordant by Dr. Lovey Newcomer. Pathology revealed FIBROCYSTIC CHANGE- NEGATIVE FOR CARCINOMA of the RIGHT breast, 9 o'clock (coil clip). This was found to be concordant by Dr. Lovey Newcomer. Pathology revealed METASTATIC CARCINOMA INVOLVING A LYMPH NODE of the RIGHT  axilla, (heart clip). This was found to be concordant by Dr. Lovey Newcomer. Pathology results were discussed with the patient by telephone. The patient reported doing well after the biopsies with tenderness at the sites. Post biopsy instructions and care were reviewed and questions were answered. The patient was encouraged to call [facility] for any additional concerns. The patient was referred to [facility] at [facility] on November 20, 2020. Pathology results reported by Stacie Acres RN on 11/11/2020. Electronically Signed   By: Lovey Newcomer M.D.   On: 11/11/2020 14:28   Result Date: 11/12/2020 CLINICAL DATA:  Patient with indeterminate right breast masses at the 10 o'clock and 9 o'clock position and low right axilla. EXAM: ULTRASOUND GUIDED RIGHT BREAST  CORE NEEDLE BIOPSY COMPARISON:  Previous exam(s). PROCEDURE: I met with the patient and we discussed the procedure of ultrasound-guided biopsy, including benefits and alternatives. We discussed the high likelihood of a successful procedure. We discussed the risks of the procedure, including infection, bleeding, tissue injury, clip migration, and inadequate sampling. Informed written consent was given. The usual time-out protocol was performed immediately prior to the procedure. Site 1: Right breast 10 o'clock position: Ribbon shaped clip Lesion quadrant: Upper outer quadrant Using sterile technique and 1% Lidocaine as local anesthetic, under direct ultrasound visualization, a 14 gauge spring-loaded device was used to perform biopsy of right breast mass 10 o'clock position using a lateral approach. At the conclusion of the procedure coil shaped tissue marker clip was deployed into the biopsy cavity. Follow up 2 view mammogram was performed and dictated separately. Site 2: Right breast 9 o'clock position: Coil clip Lesion quadrant: Lower outer quadrant Using sterile technique and 1%  Lidocaine as local anesthetic, under direct ultrasound visualization, a 14 gauge spring-loaded device was used to perform biopsy of right breast 9 o'clock mass using a lateral approach. At the conclusion of the procedure coil shaped tissue marker clip was deployed into the biopsy cavity. Follow up 2 view mammogram was performed and dictated separately. Site 3: Low right axilla: Heart clip Lesion quadrant: Upper outer quadrant Using sterile technique and 1% Lidocaine as local anesthetic, under direct ultrasound visualization, a 14 gauge spring-loaded device was used to perform biopsy of low right axillary mass using a lateral approach. At the conclusion of the procedure heart shaped tissue marker clip was deployed into the biopsy cavity. Follow up 2 view mammogram was performed and dictated separately. IMPRESSION: Ultrasound guided biopsy of 2 right breast masses and low right axillary mass. No apparent complications. Electronically Signed: By: Lovey Newcomer M.D. On: 11/08/2020 10:28  MM CLIP PLACEMENT RIGHT  Result Date: 11/08/2020 CLINICAL DATA:  Patient status post ultrasound-guided core needle biopsy 2 right breast masses and right axillary node. EXAM: 3D DIAGNOSTIC RIGHT MAMMOGRAM POST ULTRASOUND BIOPSY COMPARISON:  Previous exam(s). FINDINGS: 3D Mammographic images were obtained following ultrasound guided biopsy of 2 right breast masses and low right axillary node. Site 1: Right breast mass 10 o'clock position: Ribbon shaped clip: In appropriate position. Site 2: Right breast mass 9 o'clock position: Coil shaped clip: In appropriate position. Site 3: Right axilla: Heart shaped clip: In appropriate position. IMPRESSION: Appropriate positioning of the biopsy clips after biopsies as above. Final Assessment: Post Procedure Mammograms for Marker Placement Electronically Signed   By: Lovey Newcomer M.D.   On: 11/08/2020 10:30  [place] RT BREAST BX W LOC DEV 1ST LESION IMG BX SPEC US GUIDE  Addendum Date: 11/12/2020    ADDENDUM REPORT: 11/11/2020 14:28 ADDENDUM: 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 This was found to be concordant by Dr. Lovey Newcomer. Pathology revealed FIBROCYSTIC CHANGE- NEGATIVE FOR CARCINOMA of the RIGHT breast, 9 o'clock (coil clip). This was found to be concordant by Dr. Lovey Newcomer. Pathology revealed METASTATIC CARCINOMA INVOLVING A LYMPH NODE of the RIGHT axilla, (heart clip). This was found to be concordant by Dr. Lovey Newcomer. Pathology results were discussed with the patient by telephone. The patient reported doing well after the biopsies with tenderness at the sites. Post biopsy instructions and care were reviewed and questions were answered. The patient was encouraged to call [facility] for any additional concerns. The patient was referred  to [facility] at [facility] on November 20, 2020. Pathology results reported by Stacie Acres RN on 11/11/2020. Electronically Signed   By: Lovey Newcomer M.D.   On: 11/11/2020 14:28   Result Date: 11/12/2020 CLINICAL DATA:  Patient with indeterminate right breast masses at the 10 o'clock and 9 o'clock position and low right axilla. EXAM: ULTRASOUND GUIDED RIGHT BREAST CORE NEEDLE BIOPSY COMPARISON:  Previous exam(s). PROCEDURE: I met with the patient and we discussed the procedure of ultrasound-guided biopsy, including benefits and alternatives. We discussed the high likelihood of a successful procedure. We discussed the risks of the procedure, including infection, bleeding, tissue injury, clip migration, and inadequate sampling. Informed written consent was given. The usual time-out protocol was performed immediately prior to the procedure. Site 1: Right breast 10 o'clock position: Ribbon shaped clip Lesion quadrant: Upper outer quadrant Using sterile technique and 1% Lidocaine as local anesthetic, under direct  ultrasound visualization, a 14 gauge spring-loaded device was used to perform biopsy of right breast mass 10 o'clock position using a lateral approach. At the conclusion of the procedure coil shaped tissue marker clip was deployed into the biopsy cavity. Follow up 2 view mammogram was performed and dictated separately. Site 2: Right breast 9 o'clock position: Coil clip Lesion quadrant: Lower outer quadrant Using sterile technique and 1% Lidocaine as local anesthetic, under direct ultrasound visualization, a 14 gauge spring-loaded device was used to perform biopsy of right breast 9 o'clock mass using a lateral approach. At the conclusion of the procedure coil shaped tissue marker clip was deployed into the biopsy cavity. Follow up 2 view mammogram was performed and dictated separately. Site 3: Low right axilla: Heart clip Lesion quadrant: Upper outer quadrant Using sterile technique and 1% Lidocaine as local anesthetic, under direct ultrasound visualization, a 14 gauge spring-loaded device was used to perform biopsy of low right axillary mass using a lateral approach. At the conclusion of the procedure heart shaped tissue marker clip was deployed into the biopsy cavity. Follow up 2 view mammogram was performed and dictated separately. IMPRESSION: Ultrasound guided biopsy of 2 right breast masses and low right axillary mass. No apparent complications. Electronically Signed: By: Lovey Newcomer M.D. 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 This was found to be concordant by Dr. Lovey Newcomer. Pathology revealed FIBROCYSTIC CHANGE- NEGATIVE FOR CARCINOMA of the RIGHT breast, 9 o'clock (coil clip). This was found to be concordant by Dr. Lovey Newcomer. Pathology revealed METASTATIC CARCINOMA INVOLVING A LYMPH NODE  of the RIGHT axilla, (heart clip). This was found to be concordant by Dr. Lovey Newcomer. Pathology results were discussed with the patient by telephone. The patient reported doing well after the biopsies with tenderness at the sites. Post biopsy instructions and care were reviewed and questions were answered. The patient was encouraged to call [facility] for any additional concerns. The patient was referred to [facility] at [facility] on November 20, 2020. Pathology results reported by Stacie Acres RN on 11/11/2020. Electronically Signed   By: Lovey Newcomer M.D.   On: 11/11/2020 14:28   Result Date: 11/12/2020 CLINICAL DATA:  Patient with indeterminate right breast masses at the 10 o'clock  and 9 o'clock position and low right axilla. EXAM: ULTRASOUND GUIDED RIGHT BREAST CORE NEEDLE BIOPSY COMPARISON:  Previous exam(s). PROCEDURE: I met with the patient and we discussed the procedure of ultrasound-guided biopsy, including benefits and alternatives. We discussed the high likelihood of a successful procedure. We discussed the risks of the procedure, including infection, bleeding, tissue injury, clip migration, and inadequate sampling. Informed written consent was given. The usual time-out protocol was performed immediately prior to the procedure. Site 1: Right breast 10 o'clock position: Ribbon shaped clip Lesion quadrant: Upper outer quadrant Using sterile technique and 1% Lidocaine as local anesthetic, under direct ultrasound visualization, a 14 gauge spring-loaded device was used to perform biopsy of right breast mass 10 o'clock position using a lateral approach. At the conclusion of the procedure coil shaped tissue marker clip was deployed into the biopsy cavity. Follow up 2 view mammogram was performed and dictated separately. Site 2: Right breast 9 o'clock position: Coil clip Lesion quadrant: Lower outer quadrant Using sterile  technique and 1% Lidocaine as local anesthetic, under direct ultrasound visualization, a 14 gauge spring-loaded device was used to perform biopsy of right breast 9 o'clock mass using a lateral approach. At the conclusion of the procedure coil shaped tissue marker clip was deployed into the biopsy cavity. Follow up 2 view mammogram was performed and dictated separately. Site 3: Low right axilla: Heart clip Lesion quadrant: Upper outer quadrant Using sterile technique and 1% Lidocaine as local anesthetic, under direct ultrasound visualization, a 14 gauge spring-loaded device was used to perform biopsy of low right axillary mass using a lateral approach. At the conclusion of the procedure heart shaped tissue marker clip was deployed into the biopsy cavity. Follow up 2 view mammogram was performed and dictated separately. IMPRESSION: Ultrasound guided biopsy of 2 right breast masses and low right axillary mass. No apparent complications. Electronically Signed: By: Lovey Newcomer M.D. On: 11/08/2020 10:28     IMPRESSION/PLAN: Right breast cancer, node positive  She anticipates neoadjuvant chemotherapy followed by breast conserving surgery.  We discussed ways to optimize nutrition and to use caution regarding vitamin supplements and other nutritional supplements.  She will also meet with our nutritionist.  It was a pleasure meeting the patient today. We discussed the risks, benefits, and side effects of radiotherapy.  I recommend radiotherapy to the breast and regional nodes to reduce her risk of locoregional recurrence by 2/3.  We discussed that radiation would take approximately 6 weeks to complete and that I would give the patient a few weeks to heal following surgery before starting treatment planning.  We spoke about acute effects including skin irritation and fatigue as well as much less common late effects including internal organ injury or irritation. We spoke about the latest technology that is used to  minimize the risk of late effects for patients undergoing radiotherapy to the breast or chest wall. No guarantees of treatment were given. The patient is enthusiastic about proceeding with treatment. I look forward to participating in the patient's care.  I will await her referral back to me for postoperative follow-up and eventual CT simulation/treatment planning.   Of note the patient will also meet with genetic counseling.  On date of service, in total, I spent 45 minutes on this encounter. Patient was seen in person.   __________________________________________   Eppie Gibson, MD  This document serves as a record of services personally performed by Eppie Gibson, MD. It was created on her behalf by Roney Mans, a trained  medical scribe. The creation of this record is based on the scribe's personal observations and the provider's statements to them. This document has been checked and approved by the attending provider.

## 2020-11-19 NOTE — Progress Notes (Signed)
[facility]  [phone]) [phone] [phone]     ID: TAMEEKA LUO DOB: [birth year]  MR#: [id number]  PIP#:[id number]  Patient Care Team: Corwin Levins, MD as PCP - General Donnelly Angelica, RN as Oncology Nurse Navigator Pershing Proud, RN as Oncology Nurse Navigator Manus Rudd, MD as Consulting Physician (General Surgery) Jodelle Fausto, Valentino Hue, MD as Consulting Physician (Oncology) Olivia Mackie, MD as Consulting Physician (Obstetrics and Gynecology) Kari Baars OTHER MD:  CHIEF COMPLAINT: 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 She underwent bilateral diagnostic mammography with tomography and right breast ultrasonography at [facility] on 11/06/2020 showing: breast density category B; 1.9 cm mass at palpable site of concern in right breast at 10 o'clock; 0.8 cm smaller mass in right breast at 9 o'clock; 0.7 cm small mass in right axilla, possibly a replaced lymph node; second mildly suspicious lymph node in axilla.  Accordingly on 11/08/2020 she proceeded to biopsy of the right breast area in question. The pathology from this procedure ([id number]) showed:  1. Right Breast, 10 o'clock - invasive ductal carcinoma, grade 3  - Prognostic indicators significant for: estrogen receptor, 5% "positive "with weak staining intensity (this is functionally negative) and progesterone receptor, 0% negative. Proliferation marker Ki67 at 30%. HER2 equivocal by immunohistochemistry (2+), but negative by fluorescent in situ hybridization with a signals ratio 1.19 and number per cell 1.6. 2. Right Breast, 9 o'clock  - fibrocystic change 3. Right Axilla, lymph node  - metastatic carcinoma involving a lymph node  Cancer Staging Malignant neoplasm of upper-outer quadrant of right breast in female,  estrogen receptor positive (HCC) Staging form: Breast, AJCC 8th Edition - Clinical: Stage IIB (cT1c, cN1, cM0, G3, ER-, PR-, HER2-) - Signed by Lowella Dell, MD on 11/20/2020 Histologic grading system: 3 grade system  The patient's subsequent history is as detailed below.   INTERVAL HISTORY: Wylma was evaluated in the multidisciplinary breast cancer clinic on 11/20/2020 accompanied by her husband Trey Paula and her daughter Rutherford Nail. Her case was also presented at the multidisciplinary breast cancer conference on the same day. At that time a preliminary plan was proposed: Neoadjuvant chemotherapy followed by breast conserving surgery with targeted axillary dissection, adjuvant radiation, genetics   REVIEW OF SYSTEMS: On the provided questionnaire, Jonie reports night sweats, wearing glasses, incontinence, palpable breast lump, history of skin cancer (2014), and easy bruising. The patient denies unusual headaches, visual changes, nausea, vomiting, stiff neck, dizziness, or gait imbalance. There has been no cough, phlegm production, or pleurisy, no chest pain or pressure, and no change in bowel or bladder habits. The patient denies fever, rash, bleeding, unexplained fatigue or unexplained weight loss. A detailed review of systems was otherwise entirely negative.   COVID 19 VACCINATION STATUS: Pfizer x2, most recently 11/2019; infection 07/2020   PAST MEDICAL HISTORY: Past Medical History:  Diagnosis Date   Abdominal pain, generalized 12/27/2008   Acute sinusitis, unspecified 02/26/2007   ADD 03/13/2010   ALLERGIC RHINITIS 02/23/2007   ANEMIA 02/05/2007   Anxiety 11/17/2011   ATTENTION DEFICIT DISORDER, HX OF 02/05/2007   Breast cancer (HCC)    DEPRESSION 02/23/2007   Depression 02/18/2011   Deviated nasal septum 02/05/2007   FATIGUE 02/23/2007   GERD 12/27/2008   HYPERLIPIDEMIA 02/23/2007   SWELLING MASS OR LUMP IN HEAD AND NECK 07/27/2008   UTI 02/23/2007  PAST SURGICAL  HISTORY: Past Surgical History:  Procedure Laterality Date   BROW LIFT     CESAREAN SECTION     COSMETIC SURGERY  1995   Nasal   MELANOMA EXCISION WITH SENTINEL LYMPH NODE BIOPSY Left 10/24/2012   Procedure: MELANOMA wide EXCISION left lateral thigh WITH SENTINEL LYMPH NODE BIOPSY left groin;  Surgeon: Edward Jolly, MD;  Location: [facility];  Service: General;  Laterality: Left;   NASAL SEPTUM SURGERY      FAMILY HISTORY: Family History  Problem Relation Age of Onset   Hypertension Mother    Breast cancer Mother    COPD Father    Atrial fibrillation Father    Atrial fibrillation Sister    Brain cancer Paternal Grandfather        brain tumor, per pt   Alcohol abuse Other    Depression Other    Glaucoma Other    Breast cancer Other        3 great-aunts with breast cancer   Melanoma Cousin        metastatic   Colon polyps Neg Hx    Her father died at age 26 from COPD. Her mother is currently 58 years old as of 11/2020. Caragh has no full siblings and three half-siblings-- two brothers and one sister. She reports breast cancer in her mother and three great-aunts, brain tumor in her paternal grandfather, and metastatic melanoma in a cousin.   GYNECOLOGIC HISTORY:  No LMP recorded. Menarche: 55 years old Age at first live birth: 55 years old [place] P 2 LMP 2016 Contraceptive used for 1-2 years HRT estrogen cream irregularly Hysterectomy? no BSO? 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 She lives in [place], [place] but travels here frequently for work. Husband [name] works in Press photographer for [employer]. She lives at home with [name] and their daughter [name], who is 1 and is a Ship broker at [employer]. Daughter [name], age 77, is a Secretary/administrator in [place]. [place]. Latashia is not a Designer, fashion/clothing.    ADVANCED DIRECTIVES: In the absence of any documentation to the contrary, the  patient's spouse is their HCPOA.    HEALTH MAINTENANCE: Social History   Tobacco Use   Smoking status: Never   Smokeless tobacco: Never  Substance Use Topics   Alcohol use: Yes    Comment: Very rare occasions   Drug use: Never     Colonoscopy: 12/2014 (Dr. Henrene Pastor)  PAP: 2020  Bone density: unsure   Allergies  Allergen Reactions   Promethazine Hcl     Legs twitch    Current Outpatient Medications  Medication Sig Dispense Refill   amphetamine-dextroamphetamine (ADDERALL) 20 MG tablet Take 1 and 1/2 tablet by mouth twice per day 90 tablet 0   aspirin 81 MG EC tablet Take 81 mg by mouth daily.     atorvastatin (LIPITOR) 20 MG tablet Take 1 tablet (20 mg total) by mouth daily. 90 tablet 3   Cholecalciferol (THERA-D 2000) 50 MCG (2000 UT) TABS 1 tab by mouth once daily 30 tablet 99   conjugated estrogens (PREMARIN) vaginal cream Place 1 Applicatorful vaginally daily. 42.5 g 12   diclofenac sodium (VOLTAREN) 1 % GEL Apply 2 g topically 4 (four) times daily as needed. 100 g 5   Emollient (ROC RETINOL CORREXION EYE) CREA Use as directed daily 15 mL 11   metFORMIN (GLUCOPHAGE-XR) 500 MG 24 hr tablet  Take 1 tablet (500 mg total) by mouth daily with breakfast. 90 tablet 3   No current facility-administered medications for this visit.    OBJECTIVE: White woman who appears younger than stated age  86:   11/20/20 0905  BP: 132/69  Pulse: 89  Resp: 18  Temp: 97.9 F (36.6 C)  SpO2: 100%     Body mass index is 24.9 kg/m.   Wt Readings from Last 3 Encounters:  11/20/20 154 lb 4.8 oz (70 kg)  06/10/20 147 lb (66.7 kg)  12/20/19 144 lb (65.3 kg)      ECOG FS:1 - Symptomatic but completely ambulatory  Ocular: Sclerae unicteric, pupils round and equal Ear-nose-throat: Wearing a mask Lymphatic: No cervical or supraclavicular adenopathy Lungs no rales or rhonchi Heart regular rate and rhythm Abd soft, nontender, positive bowel sounds MSK no focal spinal tenderness, no joint  edema Neuro: non-focal, well-oriented, appropriate affect Breasts: The right breast mass is palpable in the upper outer quadrant.  There is no skin or nipple involvement.  The left breast and both axillae are benign.   LAB RESULTS:  CMP     Component Value Date/Time   NA 145 11/20/2020 0839   K 3.7 11/20/2020 0839   CL 108 11/20/2020 0839   CO2 26 11/20/2020 0839   GLUCOSE 71 11/20/2020 0839   BUN 19 11/20/2020 0839   CREATININE 0.92 11/20/2020 0839   CALCIUM 10.3 11/20/2020 0839   PROT 7.3 11/20/2020 0839   ALBUMIN 4.0 11/20/2020 0839   AST 21 11/20/2020 0839   ALT 25 11/20/2020 0839   ALKPHOS 79 11/20/2020 0839   BILITOT 0.4 11/20/2020 0839   GFRNONAA >60 11/20/2020 0839   GFRAA 100 03/23/2008 1212    No results found for: TOTALPROTELP, ALBUMINELP, A1GS, A2GS, BETS, BETA2SER, GAMS, MSPIKE, SPEI  Lab Results  Component Value Date   WBC 4.5 11/20/2020   NEUTROABS 2.7 11/20/2020   HGB 13.5 11/20/2020   HCT 39.4 11/20/2020   MCV 90.6 11/20/2020   PLT 251 11/20/2020    No results found for: LABCA2  No components found for: [id number]  No results for input(s): INR in the last 168 hours.  No results found for: LABCA2  No results found for: LKG401  No results found for: UUV253  No results found for: GUY403  No results found for: CA2729  No components found for: HGQUANT  No results found for: CEA1 / No results found for: CEA1   No results found for: AFPTUMOR  No results found for: CHROMOGRNA  No results found for: KPAFRELGTCHN, LAMBDASER, KAPLAMBRATIO (kappa/lambda light chains)  No results found for: HGBA, HGBA2QUANT, HGBFQUANT, HGBSQUAN (Hemoglobinopathy evaluation)   No results found for: LDH  Lab Results  Component Value Date   IRON 93 12/16/2018   IRONPCTSAT 25.6 12/16/2018   (Iron and TIBC)  No results found for: FERRITIN  Urinalysis    Component Value Date/Time   COLORURINE YELLOW 12/20/2019 1521   APPEARANCEUR CLEAR 12/20/2019 1521    LABSPEC 1.017 12/20/2019 1521   PHURINE 6.5 12/20/2019 1521   GLUCOSEU NEGATIVE 12/20/2019 1521   GLUCOSEU NEGATIVE 12/16/2018 1512   HGBUR NEGATIVE 12/20/2019 1521   BILIRUBINUR NEGATIVE 12/16/2018 1512   KETONESUR NEGATIVE 12/20/2019 1521   PROTEINUR NEGATIVE 12/20/2019 1521   UROBILINOGEN 0.2 12/16/2018 1512   NITRITE NEGATIVE 12/20/2019 1521   LEUKOCYTESUR NEGATIVE 12/20/2019 1521     STUDIES: US BREAST LTD UNI RIGHT INC AXILLA  Result Date: 11/06/2020 CLINICAL DATA:  55 year old female presenting  with a new lump in the right breast. Patient states the lump has been present for several months. EXAM: DIGITAL DIAGNOSTIC BILATERAL MAMMOGRAM WITH TOMOSYNTHESIS AND CAD; ULTRASOUND RIGHT BREAST LIMITED TECHNIQUE: Bilateral digital diagnostic mammography and breast tomosynthesis was performed. The images were evaluated with computer-aided detection.; Targeted ultrasound examination of the right breast was performed COMPARISON:  Previous exam(s). ACR Breast Density Category b: There are scattered areas of fibroglandular density. FINDINGS: Mammogram: Right breast: A skin BB marks the palpable site of concern in the upper outer right breast. A spot tangential view of this area is performed in addition to standard views. There is an irregular spiculated mass with associated calcifications measuring approximately 1.7 cm. There is an additional smaller irregular mass measuring approximately 0.8 cm also in the outer breast just inferior to the above described mass. Left breast: No suspicious mass, distortion, or microcalcifications are identified to suggest presence of malignancy. On physical exam at the site of concern in the upper-outer right breast I feel a fixed discrete mass. Ultrasound: Targeted ultrasound performed at the palpable site of concern in the right breast at the 10 o'clock 7 cm from the nipple demonstrating an irregular hypoechoic mass measuring 1.9 x 1.1 x 1.7 cm. There is internal  vascularity. This corresponds to the mammographic finding. At 9 o'clock 6 cm from the nipple there is an irregular hypoechoic mass measuring 0.8 x 0.4 x 0.8 cm. No internal vascularity. This corresponds to the second mass identified mammographically. Targeted ultrasound the right axilla demonstrates a small irregular hypoechoic mass labeled #1 measuring 0.6 x 0.5 x 0.7 cm, possibly a replaced lymph node. There is a lymph node with mildly thickened cortex measuring 0.5 cm labeled #2. IMPRESSION: 1. Highly suspicious mass at the palpable site of concern in the right breast at 10 o'clock measuring 1.9 cm. 2. Suspicious smaller mass in the right breast at 9 o'clock measuring 0.8 cm. 3. Suspicious small mass in the right axilla measuring 0.7 cm, possibly a replaced lymph node. There is a second mildly suspicious lymph node in the axilla. RECOMMENDATION: Ultrasound-guided core needle biopsy x3 of the right breast mass at 10 o'clock, of the right breast mass at 9 o'clock and of the small mass in the right axilla measuring 0.7 cm. I have discussed the findings and recommendations with the patient who agrees to proceed with biopsy. The patient will be scheduled for the biopsy appointment prior to leaving the office today. BI-RADS CATEGORY  5: Highly suggestive of malignancy. Electronically Signed   By: Audie Pinto M.D.   On: 11/06/2020 13:25  MM DIAG BREAST TOMO BILATERAL  Result Date: 11/06/2020 CLINICAL DATA:  55 year old female presenting with a new lump in the right breast. Patient states the lump has been present for several months. EXAM: DIGITAL DIAGNOSTIC BILATERAL MAMMOGRAM WITH TOMOSYNTHESIS AND CAD; ULTRASOUND RIGHT BREAST LIMITED TECHNIQUE: Bilateral digital diagnostic mammography and breast tomosynthesis was performed. The images were evaluated with computer-aided detection.; Targeted ultrasound examination of the right breast was performed COMPARISON:  Previous exam(s). ACR Breast Density Category b:  There are scattered areas of fibroglandular density. FINDINGS: Mammogram: Right breast: A skin BB marks the palpable site of concern in the upper outer right breast. A spot tangential view of this area is performed in addition to standard views. There is an irregular spiculated mass with associated calcifications measuring approximately 1.7 cm. There is an additional smaller irregular mass measuring approximately 0.8 cm also in the outer breast just inferior to the above described  mass. Left breast: No suspicious mass, distortion, or microcalcifications are identified to suggest presence of malignancy. On physical exam at the site of concern in the upper-outer right breast I feel a fixed discrete mass. Ultrasound: Targeted ultrasound performed at the palpable site of concern in the right breast at the 10 o'clock 7 cm from the nipple demonstrating an irregular hypoechoic mass measuring 1.9 x 1.1 x 1.7 cm. There is internal vascularity. This corresponds to the mammographic finding. At 9 o'clock 6 cm from the nipple there is an irregular hypoechoic mass measuring 0.8 x 0.4 x 0.8 cm. No internal vascularity. This corresponds to the second mass identified mammographically. Targeted ultrasound the right axilla demonstrates a small irregular hypoechoic mass labeled #1 measuring 0.6 x 0.5 x 0.7 cm, possibly a replaced lymph node. There is a lymph node with mildly thickened cortex measuring 0.5 cm labeled #2. IMPRESSION: 1. Highly suspicious mass at the palpable site of concern in the right breast at 10 o'clock measuring 1.9 cm. 2. Suspicious smaller mass in the right breast at 9 o'clock measuring 0.8 cm. 3. Suspicious small mass in the right axilla measuring 0.7 cm, possibly a replaced lymph node. There is a second mildly suspicious lymph node in the axilla. RECOMMENDATION: Ultrasound-guided core needle biopsy x3 of the right breast mass at 10 o'clock, of the right breast mass at 9 o'clock and of the small mass in the right  axilla measuring 0.7 cm. I have discussed the findings and recommendations with the patient who agrees to proceed with biopsy. The patient will be scheduled for the biopsy appointment prior to leaving the office today. BI-RADS CATEGORY  5: Highly suggestive of malignancy. Electronically Signed   By: Audie Pinto M.D.   On: 11/06/2020 13:25  [place] AXILLARY NODE CORE BIOPSY RIGHT  Addendum Date: 11/12/2020   ADDENDUM REPORT: 11/11/2020 14:28 ADDENDUM: 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 This was found to be concordant by Dr. Lovey Newcomer. Pathology revealed FIBROCYSTIC CHANGE- NEGATIVE FOR CARCINOMA of the RIGHT breast, 9 o'clock (coil clip). This was found to be concordant by Dr. Lovey Newcomer. Pathology revealed METASTATIC CARCINOMA INVOLVING A LYMPH NODE of the RIGHT axilla, (heart clip). This was found to be concordant by Dr. Lovey Newcomer. Pathology results were discussed with the patient by telephone. The patient reported doing well after the biopsies with tenderness at the sites. Post biopsy instructions and care were reviewed and questions were answered. The patient was encouraged to call [facility] for any additional concerns. The patient was referred to [facility] on November 20, 2020. Pathology results reported by Stacie Acres RN on 11/11/2020. Electronically Signed   By: Lovey Newcomer M.D.   On: 11/11/2020 14:28   Result Date: 11/12/2020 CLINICAL DATA:  Patient with indeterminate right breast masses at the 10 o'clock and 9 o'clock position and low right axilla. EXAM: ULTRASOUND GUIDED RIGHT BREAST CORE NEEDLE BIOPSY COMPARISON:  Previous exam(s). PROCEDURE: I met with the patient and we discussed the procedure of ultrasound-guided biopsy, including benefits and alternatives. We discussed the high likelihood of a successful procedure. We discussed  the risks of the procedure, including infection, bleeding, tissue injury, clip migration, and inadequate sampling. Informed written consent was given. The usual time-out protocol was performed immediately prior to the procedure. Site 1: Right breast 10 o'clock position: Ribbon shaped clip Lesion quadrant: Upper outer quadrant  Using sterile technique and 1% Lidocaine as local anesthetic, under direct ultrasound visualization, a 14 gauge spring-loaded device was used to perform biopsy of right breast mass 10 o'clock position using a lateral approach. At the conclusion of the procedure coil shaped tissue marker clip was deployed into the biopsy cavity. Follow up 2 view mammogram was performed and dictated separately. Site 2: Right breast 9 o'clock position: Coil clip Lesion quadrant: Lower outer quadrant Using sterile technique and 1% Lidocaine as local anesthetic, under direct ultrasound visualization, a 14 gauge spring-loaded device was used to perform biopsy of right breast 9 o'clock mass using a lateral approach. At the conclusion of the procedure coil shaped tissue marker clip was deployed into the biopsy cavity. Follow up 2 view mammogram was performed and dictated separately. Site 3: Low right axilla: Heart clip Lesion quadrant: Upper outer quadrant Using sterile technique and 1% Lidocaine as local anesthetic, under direct ultrasound visualization, a 14 gauge spring-loaded device was used to perform biopsy of low right axillary mass using a lateral approach. At the conclusion of the procedure heart shaped tissue marker clip was deployed into the biopsy cavity. Follow up 2 view mammogram was performed and dictated separately. IMPRESSION: Ultrasound guided biopsy of 2 right breast masses and low right axillary mass. No apparent complications. Electronically Signed: By: Lovey Newcomer M.D. On: 11/08/2020 10:28  MM CLIP PLACEMENT RIGHT  Result Date: 11/08/2020 CLINICAL DATA:  Patient status post ultrasound-guided  core needle biopsy 2 right breast masses and right axillary node. EXAM: 3D DIAGNOSTIC RIGHT MAMMOGRAM POST ULTRASOUND BIOPSY COMPARISON:  Previous exam(s). FINDINGS: 3D Mammographic images were obtained following ultrasound guided biopsy of 2 right breast masses and low right axillary node. Site 1: Right breast mass 10 o'clock position: Ribbon shaped clip: In appropriate position. Site 2: Right breast mass 9 o'clock position: Coil shaped clip: In appropriate position. Site 3: Right axilla: Heart shaped clip: In appropriate position. IMPRESSION: Appropriate positioning of the biopsy clips after biopsies as above. Final Assessment: Post Procedure Mammograms for Marker Placement Electronically Signed   By: Lovey Newcomer M.D.   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 This was found to be concordant by Dr. Lovey Newcomer. Pathology revealed FIBROCYSTIC CHANGE- NEGATIVE FOR CARCINOMA of the RIGHT breast, 9 o'clock (coil clip). This was found to be concordant by Dr. Lovey Newcomer. Pathology revealed METASTATIC CARCINOMA INVOLVING A LYMPH NODE of the RIGHT axilla, (heart clip). This was found to be concordant by Dr. Lovey Newcomer. Pathology results were discussed with the patient by telephone. The patient reported doing well after the biopsies with tenderness at the sites. Post biopsy instructions and care were reviewed and questions were answered. The patient was encouraged to call [facility] for any additional concerns. The patient was referred to [facility] on November 20, 2020. Pathology results reported by Stacie Acres RN on 11/11/2020. Electronically Signed   By: Lovey Newcomer M.D.   On: 11/11/2020 14:28   Result  Date: 11/12/2020 CLINICAL DATA:  Patient with indeterminate right breast masses at the 10 o'clock and 9 o'clock position and low right axilla. EXAM: ULTRASOUND GUIDED RIGHT BREAST CORE NEEDLE BIOPSY COMPARISON:  Previous exam(s). PROCEDURE: I met with  the patient and we discussed the procedure of ultrasound-guided biopsy, including benefits and alternatives. We discussed the high likelihood of a successful procedure. We discussed the risks of the procedure, including infection, bleeding, tissue injury, clip migration, and inadequate sampling. Informed written consent was given. The usual time-out protocol was performed immediately prior to the procedure. Site 1: Right breast 10 o'clock position: Ribbon shaped clip Lesion quadrant: Upper outer quadrant Using sterile technique and 1% Lidocaine as local anesthetic, under direct ultrasound visualization, a 14 gauge spring-loaded device was used to perform biopsy of right breast mass 10 o'clock position using a lateral approach. At the conclusion of the procedure coil shaped tissue marker clip was deployed into the biopsy cavity. Follow up 2 view mammogram was performed and dictated separately. Site 2: Right breast 9 o'clock position: Coil clip Lesion quadrant: Lower outer quadrant Using sterile technique and 1% Lidocaine as local anesthetic, under direct ultrasound visualization, a 14 gauge spring-loaded device was used to perform biopsy of right breast 9 o'clock mass using a lateral approach. At the conclusion of the procedure coil shaped tissue marker clip was deployed into the biopsy cavity. Follow up 2 view mammogram was performed and dictated separately. Site 3: Low right axilla: Heart clip Lesion quadrant: Upper outer quadrant Using sterile technique and 1% Lidocaine as local anesthetic, under direct ultrasound visualization, a 14 gauge spring-loaded device was used to perform biopsy of low right axillary mass using a lateral approach. At the conclusion of the  procedure heart shaped tissue marker clip was deployed into the biopsy cavity. Follow up 2 view mammogram was performed and dictated separately. IMPRESSION: Ultrasound guided biopsy of 2 right breast masses and low right axillary mass. No apparent complications. Electronically Signed: By: Lovey Newcomer M.D. 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 This was found to be concordant by Dr. Lovey Newcomer. Pathology revealed FIBROCYSTIC CHANGE- NEGATIVE FOR CARCINOMA of the RIGHT breast, 9 o'clock (coil clip). This was found to be concordant by Dr. Lovey Newcomer. Pathology revealed METASTATIC CARCINOMA INVOLVING A LYMPH NODE of the RIGHT axilla, (heart clip). This was found to be concordant by Dr. Lovey Newcomer. Pathology results were discussed with the patient by telephone. The patient reported doing well after the biopsies with tenderness at the sites. Post biopsy instructions and care were reviewed and questions were answered. The patient was encouraged to call [facility] for any additional concerns. The patient was referred to [facility] on November 20, 2020. Pathology results reported by Stacie Acres RN on 11/11/2020. Electronically Signed   By: Lovey Newcomer M.D.   On: 11/11/2020 14:28   Result Date: 11/12/2020 CLINICAL DATA:  Patient with indeterminate right breast masses at the 10 o'clock and 9 o'clock position and low right axilla. EXAM: ULTRASOUND GUIDED RIGHT BREAST CORE NEEDLE BIOPSY COMPARISON:  Previous exam(s). PROCEDURE: I met with the patient and we discussed the procedure of ultrasound-guided biopsy, including benefits and alternatives. We discussed the high likelihood of a successful procedure. We  discussed the risks of the procedure, including infection, bleeding, tissue injury, clip migration, and inadequate sampling. Informed written consent was given. The usual time-out protocol was performed immediately prior to the procedure. Site 1:  Right breast 10 o'clock position: Ribbon shaped clip Lesion quadrant: Upper outer quadrant Using sterile technique and 1% Lidocaine as local anesthetic, under direct ultrasound visualization, a 14 gauge spring-loaded device was used to perform biopsy of right breast mass 10 o'clock position using a lateral approach. At the conclusion of the procedure coil shaped tissue marker clip was deployed into the biopsy cavity. Follow up 2 view mammogram was performed and dictated separately. Site 2: Right breast 9 o'clock position: Coil clip Lesion quadrant: Lower outer quadrant Using sterile technique and 1% Lidocaine as local anesthetic, under direct ultrasound visualization, a 14 gauge spring-loaded device was used to perform biopsy of right breast 9 o'clock mass using a lateral approach. At the conclusion of the procedure coil shaped tissue marker clip was deployed into the biopsy cavity. Follow up 2 view mammogram was performed and dictated separately. Site 3: Low right axilla: Heart clip Lesion quadrant: Upper outer quadrant Using sterile technique and 1% Lidocaine as local anesthetic, under direct ultrasound visualization, a 14 gauge spring-loaded device was used to perform biopsy of low right axillary mass using a lateral approach. At the conclusion of the procedure heart shaped tissue marker clip was deployed into the biopsy cavity. Follow up 2 view mammogram was performed and dictated separately. IMPRESSION: Ultrasound guided biopsy of 2 right breast masses and low right axillary mass. No apparent complications. Electronically Signed: By: Lovey Newcomer M.D. On: 11/08/2020 10:28    ELIGIBLE FOR AVAILABLE RESEARCH PROTOCOL: no  ASSESSMENT: 55 y.o. [place] woman status  post right breast upper outer quadrant biopsy 11/08/2020 for a clinical T1c N1, stage IIB invasive ductal carcinoma, grade 3, functionally triple negative, with an MIB-1 of 30%  (1) genetics test  (2) neoadjuvant chemotherapy to consist of carboplatin and paclitaxel with pembrolizumab every 21 days x 4 starting 12/03/2020, to be followed by doxorubicin and cyclophosphamide with pembrolizumab every 21 days x 4  (3) definitive surgery to follow  (4) adjuvant radiation   PLAN: I met today with Syliva to review her new diagnosis. Specifically we discussed the biology of her breast cancer, its diagnosis, staging, treatment  options and prognosis.We first reviewed the fact that cancer is not one disease but more than 100 different diseases and that it is important to keep them separate-- otherwise when friends and relatives discuss their own cancer experiences with Mercadez confusion can result. Similarly we explained that if breast cancer spreads to the bone or liver, the patient would not have bone cancer or liver cancer, but breast cancer in the bone and breast cancer in the liver: one cancer in three places-- not 3 different cancers which otherwise would have to be treated in 3 different ways.  We discussed the difference between local and systemic therapy. In terms of loco-regional treatment, lumpectomy plus radiation is equivalent to mastectomy as far as survival is concerned. For this reason, and because the cosmetic results are generally superior, we recommend breast conserving surgery.   We also noted that in terms of sequencing of treatments, whether systemic therapy or surgery is done first does not affect the ultimate outcome.  We then discussed the rationale for systemic therapy. There is some risk that this cancer may have already spread to other parts of her body. Patients frequently ask at this point about bone scans, CAT scans and PET scans to find out if they have occult breast cancer  somewhere else. The problem is that in early stage disease we are much more likely to  find false positives then true cancers and this would expose the patient to unnecessary procedures as well as unnecessary radiation. Scans cannot answer the question the patient really would like to know, which is whether she has microscopic disease elsewhere in her body. For those reasons we do not recommend them.  Of course we would proceed to aggressive evaluation of any symptoms that might suggest metastatic disease, but that is not the case here.  Next we went over the options for systemic therapy which are anti-estrogens, anti-HER-2 immunotherapy, and chemotherapy. Junnie does not meet criteria for anti-HER-2 immunotherapy.  Given the progesterone receptor negative ED and the very weak low percentage estrogen "positivity", no significant benefit is expected from antiestrogens.  The cancer is functionally triple negative and needs to be treated as such.    Optimal chemotherapy for triple negative cancer currently involves the use of carboplatin and Taxol given together with Keytruda and then Adriamycin and Cytoxan also given together with Keytruda.  We discussed the possible toxicities side effects and complications of these agents and the patient will also meet with our chemotherapy teaching nurse for more in-depth discussion and also for tumor of the treatment area.  We are hoping to be able to start the chemotherapy 12/03/2020.  She will have a breast MRI, port placement, echocardiography and will see me again before the start of treatment.  Olena also qualifies for genetics testing. In patients who carry a deleterious mutation [for example in a  BRCA gene], the risk of a new breast cancer developing in the future may be sufficiently great that the patient may choose bilateral mastectomies. However if she wishes to keep her breasts in that situation it is safe to do so. That would require intensified screening,  which generally means not only yearly mammography but a yearly breast MRI as well.   Hajra has a good understanding of the overall plan. She agrees with it. She knows the goal of treatment in her case is cure. She will call with any problems that may develop before her next visit here.  Total encounter time 65 minutes.Sarajane Jews C. Freya Zobrist, MD 11/20/2020 10:51 AM Medical Oncology and Hematology [facility] [place], [place] [address] Tel. [phone]    Fax. [phone]   This document serves as a record of services personally performed by Lurline Del, MD. It was created on his behalf by Wilburn Mylar, a trained medical scribe. The creation of this record is based on the scribe's personal observations and the provider's statements to them.   I, Lurline Del MD, have reviewed the above documentation for accuracy and completeness, and I agree with the above.    *Total Encounter Time as defined by the Centers for Medicare and Medicaid Services includes, in addition to the face-to-face time of a patient visit (documented in the note above) non-face-to-face time: obtaining and reviewing outside history, ordering and reviewing medications, tests or procedures, care coordination (communications with other health care professionals or caregivers) and documentation in the medical record.

## 2020-11-20 ENCOUNTER — Encounter: Payer: Self-pay | Admitting: Genetic Counselor

## 2020-11-20 ENCOUNTER — Inpatient Hospital Stay (HOSPITAL_BASED_OUTPATIENT_CLINIC_OR_DEPARTMENT_OTHER): Payer: Managed Care, Other (non HMO) | Admitting: Genetic Counselor

## 2020-11-20 ENCOUNTER — Ambulatory Visit
Admission: RE | Admit: 2020-11-20 | Discharge: 2020-11-20 | Disposition: A | Discharge status: Home / Self Care | Payer: Managed Care, Other (non HMO) | Source: Ambulatory Visit | Attending: Radiation Oncology | Admitting: Radiation Oncology

## 2020-11-20 ENCOUNTER — Inpatient Hospital Stay: Payer: Managed Care, Other (non HMO) | Admitting: Licensed Clinical Social Worker

## 2020-11-20 ENCOUNTER — Encounter: Payer: Self-pay | Admitting: *Deleted

## 2020-11-20 ENCOUNTER — Encounter: Payer: Self-pay | Admitting: Oncology

## 2020-11-20 ENCOUNTER — Inpatient Hospital Stay: Payer: Managed Care, Other (non HMO) | Attending: Oncology | Admitting: Oncology

## 2020-11-20 ENCOUNTER — Other Ambulatory Visit: Payer: Self-pay

## 2020-11-20 ENCOUNTER — Encounter: Payer: Self-pay | Admitting: Radiation Oncology

## 2020-11-20 ENCOUNTER — Inpatient Hospital Stay: Payer: Managed Care, Other (non HMO)

## 2020-11-20 ENCOUNTER — Ambulatory Visit: Payer: Self-pay | Admitting: Surgery

## 2020-11-20 VITALS — BP 132/69 | HR 89 | Temp 97.9°F | Resp 18 | Ht 66.0 in | Wt 154.3 lb

## 2020-11-20 DIAGNOSIS — C773 Secondary and unspecified malignant neoplasm of axilla and upper limb lymph nodes: Secondary | ICD-10-CM | POA: Insufficient documentation

## 2020-11-20 DIAGNOSIS — Z803 Family history of malignant neoplasm of breast: Secondary | ICD-10-CM

## 2020-11-20 DIAGNOSIS — Z7952 Long term (current) use of systemic steroids: Secondary | ICD-10-CM | POA: Diagnosis not present

## 2020-11-20 DIAGNOSIS — Z7984 Long term (current) use of oral hypoglycemic drugs: Secondary | ICD-10-CM | POA: Diagnosis not present

## 2020-11-20 DIAGNOSIS — Z7982 Long term (current) use of aspirin: Secondary | ICD-10-CM | POA: Insufficient documentation

## 2020-11-20 DIAGNOSIS — Z5111 Encounter for antineoplastic chemotherapy: Secondary | ICD-10-CM | POA: Insufficient documentation

## 2020-11-20 DIAGNOSIS — Z170 Estrogen receptor positive status [ER+]: Secondary | ICD-10-CM

## 2020-11-20 DIAGNOSIS — E785 Hyperlipidemia, unspecified: Secondary | ICD-10-CM | POA: Diagnosis not present

## 2020-11-20 DIAGNOSIS — C50411 Malignant neoplasm of upper-outer quadrant of right female breast: Secondary | ICD-10-CM | POA: Insufficient documentation

## 2020-11-20 DIAGNOSIS — Z808 Family history of malignant neoplasm of other organs or systems: Secondary | ICD-10-CM | POA: Diagnosis not present

## 2020-11-20 DIAGNOSIS — E119 Type 2 diabetes mellitus without complications: Secondary | ICD-10-CM | POA: Diagnosis not present

## 2020-11-20 DIAGNOSIS — Z79899 Other long term (current) drug therapy: Secondary | ICD-10-CM | POA: Diagnosis not present

## 2020-11-20 DIAGNOSIS — Z85820 Personal history of malignant melanoma of skin: Secondary | ICD-10-CM | POA: Insufficient documentation

## 2020-11-20 DIAGNOSIS — Z5112 Encounter for antineoplastic immunotherapy: Secondary | ICD-10-CM | POA: Insufficient documentation

## 2020-11-20 DIAGNOSIS — Z171 Estrogen receptor negative status [ER-]: Secondary | ICD-10-CM

## 2020-11-20 HISTORY — DX: Family history of malignant neoplasm of breast: Z80.3

## 2020-11-20 HISTORY — DX: Personal history of malignant melanoma of skin: Z85.820

## 2020-11-20 HISTORY — DX: Family history of malignant neoplasm of other organs or systems: Z80.8

## 2020-11-20 LAB — CMP (CANCER CENTER ONLY)
ALT: 25 U/L (ref 0–44)
AST: 21 U/L (ref 15–41)
Albumin: 4 g/dL (ref 3.5–5.0)
Alkaline Phosphatase: 79 U/L (ref 38–126)
Anion gap: 11 (ref 5–15)
BUN: 19 mg/dL (ref 6–20)
CO2: 26 mmol/L (ref 22–32)
Calcium: 10.3 mg/dL (ref 8.9–10.3)
Chloride: 108 mmol/L (ref 98–111)
Creatinine: 0.92 mg/dL (ref 0.44–1.00)
GFR, Estimated: >60 mL/min (ref >60)
Glucose, Bld: 71 mg/dL (ref 70–99)
Potassium: 3.7 mmol/L (ref 3.5–5.1)
Sodium: 145 mmol/L (ref 135–145)
Total Bilirubin: 0.4 mg/dL (ref 0.3–1.2)
Total Protein: 7.3 g/dL (ref 6.5–8.1)

## 2020-11-20 LAB — CBC WITH DIFFERENTIAL (CANCER CENTER ONLY)
Abs Immature Granulocytes: 0 10*3/uL (ref 0.00–0.07)
Basophils Absolute: 0 10*3/uL (ref 0.0–0.1)
Basophils Relative: 1 %
Eosinophils Absolute: 0.1 10*3/uL (ref 0.0–0.5)
Eosinophils Relative: 2 %
HCT: 39.4 % (ref 36.0–46.0)
Hemoglobin: 13.5 g/dL (ref 12.0–15.0)
Immature Granulocytes: 0 %
Lymphocytes Relative: 29 %
Lymphs Abs: 1.3 10*3/uL (ref 0.7–4.0)
MCH: 31 pg (ref 26.0–34.0)
MCHC: 34.3 g/dL (ref 30.0–36.0)
MCV: 90.6 fL (ref 80.0–100.0)
Monocytes Absolute: 0.4 10*3/uL (ref 0.1–1.0)
Monocytes Relative: 8 %
Neutro Abs: 2.7 10*3/uL (ref 1.7–7.7)
Neutrophils Relative %: 60 %
Platelet Count: 251 10*3/uL (ref 150–400)
RBC: 4.35 MIL/uL (ref 3.87–5.11)
RDW: 12 % (ref 11.5–15.5)
WBC Count: 4.5 10*3/uL (ref 4.0–10.5)
nRBC: 0 % (ref 0.0–0.2)

## 2020-11-20 LAB — GENETIC SCREENING ORDER

## 2020-11-20 MED ORDER — LIDOCAINE-PRILOCAINE 2.5-2.5 % EX CREA: TOPICAL_CREAM | CUTANEOUS | 3 refills | Status: DC

## 2020-11-20 MED ORDER — DEXAMETHASONE 4 MG PO TABS: ORAL_TABLET | ORAL | 1 refills | Status: DC

## 2020-11-20 MED ORDER — PROCHLORPERAZINE MALEATE 10 MG PO TABS: 10.0000 mg | ORAL_TABLET | Freq: Four times a day (QID) | ORAL | 1 refills | Status: DC | PRN

## 2020-11-20 NOTE — Progress Notes (Signed)
[place] Work  Initial Assessment   Robin Farley is a 55 y.o. year old female accompanied by daughter and husband [name] & [name]). Clinical Social Work was referred by [facility] for assessment of psychosocial needs.   SDOH (Social Determinants of Health) assessments performed: Yes SDOH Interventions    Flowsheet Row Most Recent Value  SDOH Interventions   Food Insecurity Interventions Intervention Not Indicated  Financial Strain Interventions Intervention Not Indicated  Transportation Interventions Intervention Not Indicated       Distress Screen completed: Yes ONCBCN DISTRESS SCREENING 11/20/2020  Screening Type Initial Screening  Distress experienced in past week (1-10) 7  Practical problem type Work/school  Emotional problem type Adjusting to illness;Isolation/feeling alone;Adjusting to appearance changes  Spiritual/Religous concerns type Facing my mortality;Loss of sense of purpose      Family/Social Information:  Housing Arrangement: patient lives with husband, daughter Robin Farley (when not at college). Split time between [facility] & [place] . Older daughter Robin Farley- 62) lives in [place] Family members/support persons in your life? Family and Friends/Colleagues Transportation concerns: no  Employment: Working full time as Chartered certified accountant for [employer]. Income source: Employment. Believes she has access to short-term disability, FMLA Financial concerns: No Type of concern: None Food access concerns: no Medication Concerns: concerned about side effects of chemo  Services Currently in place:  n/a  Coping/ Adjustment to diagnosis: Patient understands treatment plan and what happens next? yes, still processing all of the information from today but generally understands the plan Concerns about diagnosis and/or treatment: Pain or discomfort during procedures, Overwhelmed by information, and body image, impact on work Patient reported stressors: Work/ school, Adjusting to my  illness, Isolation/ feeling alone, Facing my mortality, and Loss of sense of purpose Hopes and priorities: hopes to still work as much as possible through treatment (at least doing administrative parts if she cannot travel to the different sites) Patient enjoys reading, watching TV, and travel, games/ cards Current coping skills/ strengths: Capable of independent living, Motivation for treatment/growth, Special hobby/interest, and Supportive family/friends    SUMMARY: Current SDOH Barriers:  No significant SDOH barriers at this time  Interventions: Discussed common feeling and emotions when being diagnosed with cancer, and the importance of support during treatment Informed patient of the support team roles and support services at [facility] Provided CSW contact information and encouraged patient to call with any questions or concerns   Follow Up Plan: Patient will contact CSW with any support or resource needs Patient verbalizes understanding of plan: Yes    Christeen Douglas LCSW

## 2020-11-20 NOTE — Progress Notes (Signed)
REFERRING PROVIDER: Magrinat, Gustav C, MD [address] [place],  [place] [address]  PRIMARY PROVIDER:  John, James W, MD  PRIMARY REASON FOR VISIT:  1. Malignant neoplasm of upper-outer quadrant of right breast in female  2. Personal history of malignant melanoma   3. Family history of breast cancer   4. Family history of melanoma     HISTORY OF PRESENT ILLNESS:   Robin Farley, a 55 y.o. female, was seen for a [facility] cancer genetics consultation at the request of Dr. Magrinat due to a personal and family history of cancer.  Robin Farley presents to clinic today to discuss the possibility of a hereditary predisposition to cancer, to discuss genetic testing, and to further clarify her future cancer risks, as well as potential cancer risks for family members.   In August, at the age of 55, Robin Farley was diagnosed with invasive ductal carcinoma of the right breast (functionally triple negative). The preliminary treatment plan includes chemotherapy, surgery, and adjuvant radiation.  She also has a personal history of melanoma of the lateral left thigh, diagnosed in 2014 at the age of 47, s/p wide excision.   CANCER HISTORY:  Oncology History  Malignant neoplasm of upper-outer quadrant of right breast in female, estrogen receptor positive (HCC)  11/12/2020 Initial Diagnosis   Malignant neoplasm of upper-outer quadrant of right breast in female, estrogen receptor positive (HCC)    11/20/2020 Cancer Staging   Staging form: Breast, AJCC 8th Edition - Clinical: Stage IIB (cT1c, cN1, cM0, G3, ER-, PR-, HER2-) - Signed by Magrinat, Gustav C, MD on 11/20/2020  Histologic grading system: 3 grade system      RISK FACTORS:  Menarche was at age 10 or 11.  First live birth at age 31.  OCP use for approximately 2 years.  Ovaries intact: yes.  Hysterectomy: no.  Menopausal status: postmenopausal.  HRT use: 0 years. Colonoscopy: yes;  most recent in 2018 or 2019 . Mammogram within the last  year: yes. Up to date with pelvic exams: most recent PAP in 2020.   Past Medical History:  Diagnosis Date   Abdominal pain, generalized 12/27/2008   Acute sinusitis, unspecified 02/26/2007   ADD 03/13/2010   ALLERGIC RHINITIS 02/23/2007   ANEMIA 02/05/2007   Anxiety 11/17/2011   ATTENTION DEFICIT DISORDER, HX OF 02/05/2007   Breast cancer (HCC)    DEPRESSION 02/23/2007   Depression 02/18/2011   Deviated nasal septum 02/05/2007   Family history of breast cancer 11/20/2020   Family history of melanoma 11/20/2020   FATIGUE 02/23/2007   GERD 12/27/2008   HYPERLIPIDEMIA 02/23/2007   Personal history of malignant melanoma 11/20/2020   SWELLING MASS OR LUMP IN HEAD AND NECK 07/27/2008   UTI 02/23/2007    Past Surgical History:  Procedure Laterality Date   BROW LIFT     CESAREAN SECTION     COSMETIC SURGERY  1995   Nasal   MELANOMA EXCISION WITH SENTINEL LYMPH NODE BIOPSY Left 10/24/2012   Procedure: MELANOMA wide EXCISION left lateral thigh WITH SENTINEL LYMPH NODE BIOPSY left groin;  Surgeon: Benjamin T Hoxworth, MD;  Location: [facility];  Service: General;  Laterality: Left;   NASAL SEPTUM SURGERY      Social History   Socioeconomic History   Marital status: Married    Spouse name: Not on file   Number of children: 2   Years of education: Not on file   Highest education level: Not on file  Occupational History     Occupation: Brewing technologist in Product manager: [employer]  Tobacco Use   Smoking status: Never   Smokeless tobacco: Never  Substance and Sexual Activity   Alcohol use: Yes    Comment: Very rare occasions   Drug use: Never   Sexual activity: Yes    Birth control/protection: None  Other Topics Concern   Not on file  Social History Narrative   Not on file   Social Determinants of Health   Financial Resource Strain: Low Risk    Difficulty of Paying Living Expenses: Not hard at all  Food Insecurity: No Food Insecurity    Worried About Charity fundraiser in the Last Year: Never true   [place] in the Last Year: Never true  Transportation Needs: No Transportation Needs   Lack of Transportation (Medical): No   Lack of Transportation (Non-Medical): No  Physical Activity: Not on file  Stress: Not on file  Social Connections: Not on file     FAMILY HISTORY:  We obtained a detailed, 4-generation family history.  Significant diagnoses are listed below: Family History  Problem Relation Age of Onset   Breast cancer Mother 28   Kidney cancer Maternal Aunt        dx 12s   Leukemia Paternal Aunt        dx after 42   Cancer Paternal Aunt        unknown type; dx after 69   Brain tumor Paternal Grandfather 37   Melanoma Cousin        arm   Breast cancer Other        MGM's sisters, x3, dx after 6    Robin Farley is unaware of previous family history of genetic testing for hereditary cancer risks. There is no reported Ashkenazi Jewish ancestry. There is no known consanguinity.  GENETIC COUNSELING ASSESSMENT: Robin Farley is a 55 y.o. female with a personal family history of cancer which is somewhat suggestive of a hereditary cancer syndrome and predisposition to cancer given her triple negative status and the presence of related cancers in the family. We, therefore, discussed and recommended the following at today's visit.   DISCUSSION: We discussed that 5 - 10% of cancer is hereditary, with most cases of hereditary breast associated with mutations in BRCA1/2.  There are other genes that can be associated with hereditary breast cancer syndromes.  Type of cancer risk and level of risk are gene-specific.  We discussed that testing is beneficial for several reasons including knowing how to follow individuals after completing their treatment, identifying whether potential treatment options would be beneficial, and understanding if other family members could be at risk for cancer and allowing them to undergo genetic  testing.   We reviewed the characteristics, features and inheritance patterns of hereditary cancer syndromes. We also discussed genetic testing, including the appropriate family members to test, the process of testing, insurance coverage and turn-around-time for results. We discussed the implications of a negative, positive, carrier and/or variant of uncertain significant result. We recommended Robin Farley pursue genetic testing for a panel that includes genes associated with breast cancer and melanoma.   The CancerNext-Expanded gene panel offered by [facility] and includes sequencing, rearrangement, and RNA analysis for the following 77 genes: AIP, ALK, APC, ATM, AXIN2, BAP1, BARD1, BLM, BMPR1A, BRCA1, BRCA2, BRIP1, CDC73, CDH1, CDK4, CDKN1B, CDKN2A, CHEK2, CTNNA1, DICER1, FANCC, FH, FLCN, GALNT12, KIF1B, LZTR1, MAX, MEN1, MET, MLH1, MSH2, MSH3, MSH6, MUTYH, NBN, NF1,  NF2, NTHL1, PALB2, PHOX2B, PMS2, POT1, PRKAR1A, PTCH1, PTEN, RAD51C, RAD51D, RB1, RECQL, RET, SDHA, SDHAF2, SDHB, SDHC, SDHD, SMAD4, SMARCA4, SMARCB1, SMARCE1, STK11, SUFU, TMEM127, TP53, TSC1, TSC2, VHL and XRCC2 (sequencing and deletion/duplication); EGFR, EGLN1, HOXB13, KIT, MITF, PDGFRA, POLD1, and POLE (sequencing only); EPCAM and GREM1 (deletion/duplication only).   Based on Robin Farley's personal and family history of cancer, she meets medical criteria for genetic testing. Despite that she meets criteria, she may still have an out of pocket cost. We discussed that if her out of pocket cost for testing is over $100, the laboratory should contact her to discuss self-pay options and/or patient pay assistance programs.   PLAN: After considering the risks, benefits, and limitations, Robin Farley provided informed consent to pursue genetic testing and the blood sample was sent to [facility] for analysis of the CancerNext-Expanded +RNAinsight Panel. Results should be available within approximately 3 weeks' time, at which point they will  be disclosed by telephone to Robin Farley, as will any additional recommendations warranted by these results. Robin Farley will receive a summary of her genetic counseling visit and a copy of her results once available. This information will also be available in Epic.   Lastly, we encouraged Robin Farley to remain in contact with cancer genetics annually so that we can continuously update the family history and inform her of any changes in cancer genetics and testing that may be of benefit for this family.   Robin Farley questions were answered to her satisfaction today. Our contact information was provided should additional questions or concerns arise. Thank you for the referral and allowing [place] to share in the care of your patient.   Shahzaib Azevedo M. Joette Catching, [place], [facility] Genetic Counselor [email or web address] (P) [phone]  The patient was seen for a total of 20 minutes in face-to-face genetic counseling.  The patient was accompanied by her husband and her daughter, Robin Farley.  Drs. Magrinat, Lindi Adie and/or Burr Medico were available to discuss this case as needed.    _______________________________________________________________________ For Office Staff:  Number of people involved in session: 3 Was an Intern/ student involved with case: no

## 2020-11-20 NOTE — H&P (View-Only) (Signed)
REFERRING PHYSICIAN:  Dr. 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 female who is seen today as an office consultation at the request of Dr. Jenny Reichmann for evaluation of invasive ductal carcinoma right breast.  This is a healthy 55 year old with a history of melanoma of the left leg status post wide excision and sentinel lymph node biopsy in 2014 by Dr. Adonis Housekeeper.  The patient has had not had mammograms in the last 3 years.  Recently she noticed some asymmetry and a possible mass in her right breast.  This was about 4 months ago.  She brought this to the attention of her gynecologist to obtain mammogram followed by ultrasound and biopsy.  She had 2 separate masses noted in the right breast.  At 10:00 located 7 cm from the nipple there was a 1.9 x 1.1 x 1.7 cm mass.  At 9:00 there was a smaller mass that was subsequently biopsied and was benign.  Axillary ultrasound showed 2 enlarged lymph nodes.  1 of these was biopsied.  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  The axillary lymph node showed metastatic carcinoma.  She presents now for her first consultation.  The patient has a home here in [place] but also has a primary residence in [place].  She is planning on having all of her treatment here in [place].  Her job requires frequent traveling.  Family history of breast cancer in her mother who was in her 87s as well as 3 different great aunts.  Another cousin had metastatic melanoma.   Review of Systems: A complete review of systems was obtained from the patient.  I have reviewed this information and discussed as appropriate with the patient.  See HPI as well for other ROS.  Review of  Systems  Constitutional: Positive for fever (night sweats).  HENT: Negative.   Eyes: Negative.   Respiratory: Negative.   Cardiovascular: Negative.   Gastrointestinal: Negative.   Genitourinary: Negative.   Musculoskeletal: Negative.   Skin: Negative.   Neurological: Negative.   Endo/Heme/Allergies: Negative.   Psychiatric/Behavioral: Negative.       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 Excell Seltzer  No Known Allergies  Current Outpatient Medications on File Prior to Visit  Medication Sig Dispense Refill   atorvastatin (  LIPITOR) 20 MG tablet Take 20 mg by mouth once daily     cholecalciferol (VITAMIN D3) 2,000 unit tablet 1 tab by mouth once daily     dextroamphetamine-amphetamine (ADDERALL) 20 mg tablet Take 1 and 1/2 tablet by mouth twice per day     metFORMIN (GLUCOPHAGE-XR) 500 MG XR tablet Take 500 mg by mouth daily with breakfast     No current facility-administered medications on file prior to visit.    Family History  Problem Relation Age of Onset   High blood pressure (Hypertension) Mother    Hyperlipidemia (Elevated cholesterol) Mother    Diabetes Mother    Breast cancer  Mother    Deep vein thrombosis (DVT or abnormal blood clot formation) Father    High blood pressure (Hypertension) Brother    Hyperlipidemia (Elevated cholesterol) Brother      Social History   Tobacco Use  Smoking Status Never Smoker  Smokeless Tobacco Never Used     Social History   Socioeconomic History   Marital status: Unknown  Tobacco Use   Smoking status: Never Smoker   Smokeless tobacco: Never Used  Scientific laboratory technician Use: Never used  Substance and Sexual Activity   Alcohol use: Yes   Drug use: Never    Objective:    There were no vitals filed for this visit.  There is no height or weight on file to calculate BMI.  Physical Exam   Constitutional:  WDWN in NAD, conversant, no obvious deformities; lying in bed comfortably Eyes:  Pupils equal, round; sclera anicteric; moist conjunctiva; no lid lag HENT:  Oral mucosa moist; good dentition  Neck:  No masses palpated, trachea midline; no thyromegaly Lungs:  CTA bilaterally; normal respiratory effort Breasts:  symmetric, no nipple changes; right lateral breast shows a palpable mass with some slight skin retraction but no overlying skin changes.  The mass is about 2 cm in diameter.  No palpable axillary lymph nodes. CV:  Regular rate and rhythm; no murmurs; extremities well-perfused with no edema Abd:  +bowel sounds, soft, non-tender, no palpable organomegaly; no palpable hernias Musc:  Unable to assess gait; no apparent clubbing or cyanosis in extremities Lymphatic:  No palpable cervical or axillary lymphadenopathy Skin:  Warm, dry; no sign of jaundice Psychiatric - alert and oriented x 4; calm mood and affect   Labs, Imaging and Diagnostic Testing: Path - IDC-3, triple negative, Ki67 30%.  Axillary lymph node positive.  Mass at 0900 negative.  CLINICAL DATA:  55 year old female presenting with a new lump in the right breast. Patient states the lump has been present for several months.   EXAM: DIGITAL  DIAGNOSTIC BILATERAL MAMMOGRAM WITH TOMOSYNTHESIS AND CAD; ULTRASOUND RIGHT BREAST LIMITED   TECHNIQUE: Bilateral digital diagnostic mammography and breast tomosynthesis was performed. The images were evaluated with computer-aided detection.; Targeted ultrasound examination of the right breast was performed   COMPARISON:  Previous exam(s).   ACR Breast Density Category b: There are scattered areas of fibroglandular density.   FINDINGS: Mammogram:   Right breast: A skin BB marks the palpable site of concern in the upper outer right breast. A spot tangential view of this area is performed in addition to standard views. There is an irregular spiculated mass with associated calcifications measuring approximately 1.7 cm. There is an additional smaller irregular mass measuring approximately 0.8 cm also in the outer breast just inferior to the above described mass.   Left breast: No suspicious mass, distortion, or microcalcifications are identified to  suggest presence of malignancy.   On physical exam at the site of concern in the upper-outer right breast I feel a fixed discrete mass.   Ultrasound:   Targeted ultrasound performed at the palpable site of concern in the right breast at the 10 o'clock 7 cm from the nipple demonstrating an irregular hypoechoic mass measuring 1.9 x 1.1 x 1.7 cm. There is internal vascularity. This corresponds to the mammographic finding.   At 9 o'clock 6 cm from the nipple there is an irregular hypoechoic mass measuring 0.8 x 0.4 x 0.8 cm. No internal vascularity. This corresponds to the second mass identified mammographically.   Targeted ultrasound the right axilla demonstrates a small irregular hypoechoic mass labeled #1 measuring 0.6 x 0.5 x 0.7 cm, possibly a replaced lymph node. There is a lymph node with mildly thickened cortex measuring 0.5 cm labeled #2.   IMPRESSION: 1. Highly suspicious mass at the palpable site of concern in the right  breast at 10 o'clock measuring 1.9 cm.   2. Suspicious smaller mass in the right breast at 9 o'clock measuring 0.8 cm.   3. Suspicious small mass in the right axilla measuring 0.7 cm, possibly a replaced lymph node. There is a second mildly suspicious lymph node in the axilla.   RECOMMENDATION: Ultrasound-guided core needle biopsy x3 of the right breast mass at 10 o'clock, of the right breast mass at 9 o'clock and of the small mass in the right axilla measuring 0.7 cm.   I have discussed the findings and recommendations with the patient who agrees to proceed with biopsy. The patient will be scheduled for the biopsy appointment prior to leaving the office today.   BI-RADS CATEGORY  5: Highly suggestive of malignancy.     Electronically Signed   By: Audie Pinto M.D.   On: 11/06/2020 13:25    Assessment and Plan:  Diagnoses and all orders for this visit:  Malignant neoplasm of upper-outer quadrant of right breast in female, estrogen receptor positive (CMS-HCC)     After discussion in breast multidisciplinary clinic, the plan is for neoadjuvant chemotherapy.  This will be followed by eventual lumpectomy and targeted axial lymph node dissection followed by radiation.  We will perform ultrasound-guided port placement as soon as possible to begin chemotherapy.  The surgical procedure has been discussed with the patient.  Potential risks, benefits, alternative treatments, and expected outcomes have been explained.  All of the patient's questions at this time have been answered.  The likelihood of reaching the patient's treatment goal is good.  The patient understand the proposed surgical procedure and wishes to proceed.   No follow-ups on file.  Miguel Medal Jearld Adjutant, MD  11/20/2020 11:05 AM   I had direct face-to-face contact with the patient for a total of 65 minutes and greater than 50% of that time was spent providing counseling and/or coordination of care for the patient  regarding right breast invasive ductal carcinoma with axillary metastases.Marland Kitchen

## 2020-11-20 NOTE — H&P (Signed)
REFERRING PHYSICIAN:  Dr. 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 female who is seen today as an office consultation at the request of Dr. Jenny Farley for evaluation of invasive ductal carcinoma right breast.  This is a healthy 55 year old with a history of melanoma of the left leg status post wide excision and sentinel lymph node biopsy in 2014 by Dr. Adonis Farley.  The patient has had not had mammograms in the last 3 years.  Recently she noticed some asymmetry and a possible mass in her right breast.  This was about 4 months ago.  She brought this to the attention of her gynecologist to obtain mammogram followed by ultrasound and biopsy.  She had 2 separate masses noted in the right breast.  At 10:00 located 7 cm from the nipple there was a 1.9 x 1.1 x 1.7 cm mass.  At 9:00 there was a smaller mass that was subsequently biopsied and was benign.  Axillary ultrasound showed 2 enlarged lymph nodes.  1 of these was biopsied.  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  The axillary lymph node showed metastatic carcinoma.  She presents now for her first consultation.  The patient has a home here in [place] but also has a primary residence in [place].  She is planning on having all of her treatment here in [place].  Her job requires frequent traveling.  Family history of breast cancer in her mother who was in her 36s as well as 3 different great aunts.  Another cousin had metastatic melanoma.   Review of Systems: A complete review of systems was obtained from the patient.  I have reviewed this information and discussed as appropriate with the patient.  See HPI as well for other ROS.  Review of  Systems  Constitutional: Positive for fever (night sweats).  HENT: Negative.   Eyes: Negative.   Respiratory: Negative.   Cardiovascular: Negative.   Gastrointestinal: Negative.   Genitourinary: Negative.   Musculoskeletal: Negative.   Skin: Negative.   Neurological: Negative.   Endo/Heme/Allergies: Negative.   Psychiatric/Behavioral: Negative.       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 Excell Farley  No Known Allergies  Current Outpatient Medications on File Prior to Visit  Medication Sig Dispense Refill   atorvastatin (  LIPITOR) 20 MG tablet Take 20 mg by mouth once daily     cholecalciferol (VITAMIN D3) 2,000 unit tablet 1 tab by mouth once daily     dextroamphetamine-amphetamine (ADDERALL) 20 mg tablet Take 1 and 1/2 tablet by mouth twice per day     metFORMIN (GLUCOPHAGE-XR) 500 MG XR tablet Take 500 mg by mouth daily with breakfast     No current facility-administered medications on file prior to visit.    Family History  Problem Relation Age of Onset   High blood pressure (Hypertension) Mother    Hyperlipidemia (Elevated cholesterol) Mother    Diabetes Mother    Breast cancer  Mother    Deep vein thrombosis (DVT or abnormal blood clot formation) Father    High blood pressure (Hypertension) Brother    Hyperlipidemia (Elevated cholesterol) Brother      Social History   Tobacco Use  Smoking Status Never Smoker  Smokeless Tobacco Never Used     Social History   Socioeconomic History   Marital status: Unknown  Tobacco Use   Smoking status: Never Smoker   Smokeless tobacco: Never Used  Scientific laboratory technician Use: Never used  Substance and Sexual Activity   Alcohol use: Yes   Drug use: Never    Objective:    There were no vitals filed for this visit.  There is no height or weight on file to calculate BMI.  Physical Exam   Constitutional:  WDWN in NAD, conversant, no obvious deformities; lying in bed comfortably Eyes:  Pupils equal, round; sclera anicteric; moist conjunctiva; no lid lag HENT:  Oral mucosa moist; good dentition  Neck:  No masses palpated, trachea midline; no thyromegaly Lungs:  CTA bilaterally; normal respiratory effort Breasts:  symmetric, no nipple changes; right lateral breast shows a palpable mass with some slight skin retraction but no overlying skin changes.  The mass is about 2 cm in diameter.  No palpable axillary lymph nodes. CV:  Regular rate and rhythm; no murmurs; extremities well-perfused with no edema Abd:  +bowel sounds, soft, non-tender, no palpable organomegaly; no palpable hernias Musc:  Unable to assess gait; no apparent clubbing or cyanosis in extremities Lymphatic:  No palpable cervical or axillary lymphadenopathy Skin:  Warm, dry; no sign of jaundice Psychiatric - alert and oriented x 4; calm mood and affect   Labs, Imaging and Diagnostic Testing: Path - IDC-3, triple negative, Ki67 30%.  Axillary lymph node positive.  Mass at 0900 negative.  CLINICAL DATA:  55 year old female presenting with a new lump in the right breast. Patient states the lump has been present for several months.   EXAM: DIGITAL  DIAGNOSTIC BILATERAL MAMMOGRAM WITH TOMOSYNTHESIS AND CAD; ULTRASOUND RIGHT BREAST LIMITED   TECHNIQUE: Bilateral digital diagnostic mammography and breast tomosynthesis was performed. The images were evaluated with computer-aided detection.; Targeted ultrasound examination of the right breast was performed   COMPARISON:  Previous exam(s).   ACR Breast Density Category b: There are scattered areas of fibroglandular density.   FINDINGS: Mammogram:   Right breast: A skin BB marks the palpable site of concern in the upper outer right breast. A spot tangential view of this area is performed in addition to standard views. There is an irregular spiculated mass with associated calcifications measuring approximately 1.7 cm. There is an additional smaller irregular mass measuring approximately 0.8 cm also in the outer breast just inferior to the above described mass.   Left breast: No suspicious mass, distortion, or microcalcifications are identified to  suggest presence of malignancy.   On physical exam at the site of concern in the upper-outer right breast I feel a fixed discrete mass.   Ultrasound:   Targeted ultrasound performed at the palpable site of concern in the right breast at the 10 o'clock 7 cm from the nipple demonstrating an irregular hypoechoic mass measuring 1.9 x 1.1 x 1.7 cm. There is internal vascularity. This corresponds to the mammographic finding.   At 9 o'clock 6 cm from the nipple there is an irregular hypoechoic mass measuring 0.8 x 0.4 x 0.8 cm. No internal vascularity. This corresponds to the second mass identified mammographically.   Targeted ultrasound the right axilla demonstrates a small irregular hypoechoic mass labeled #1 measuring 0.6 x 0.5 x 0.7 cm, possibly a replaced lymph node. There is a lymph node with mildly thickened cortex measuring 0.5 cm labeled #2.   IMPRESSION: 1. Highly suspicious mass at the palpable site of concern in the right  breast at 10 o'clock measuring 1.9 cm.   2. Suspicious smaller mass in the right breast at 9 o'clock measuring 0.8 cm.   3. Suspicious small mass in the right axilla measuring 0.7 cm, possibly a replaced lymph node. There is a second mildly suspicious lymph node in the axilla.   RECOMMENDATION: Ultrasound-guided core needle biopsy x3 of the right breast mass at 10 o'clock, of the right breast mass at 9 o'clock and of the small mass in the right axilla measuring 0.7 cm.   I have discussed the findings and recommendations with the patient who agrees to proceed with biopsy. The patient will be scheduled for the biopsy appointment prior to leaving the office today.   BI-RADS CATEGORY  5: Highly suggestive of malignancy.     Electronically Signed   By: Audie Pinto M.D.   On: 11/06/2020 13:25    Assessment and Plan:  Diagnoses and all orders for this visit:  Malignant neoplasm of upper-outer quadrant of right breast in female, estrogen receptor positive (CMS-HCC)     After discussion in breast multidisciplinary clinic, the plan is for neoadjuvant chemotherapy.  This will be followed by eventual lumpectomy and targeted axial lymph node dissection followed by radiation.  We will perform ultrasound-guided port placement as soon as possible to begin chemotherapy.  The surgical procedure has been discussed with the patient.  Potential risks, benefits, alternative treatments, and expected outcomes have been explained.  All of the patient's questions at this time have been answered.  The likelihood of reaching the patient's treatment goal is good.  The patient understand the proposed surgical procedure and wishes to proceed.   No follow-ups on file.  Salam Micucci Jearld Adjutant, MD  11/20/2020 11:05 AM   I had direct face-to-face contact with the patient for a total of 65 minutes and greater than 50% of that time was spent providing counseling and/or coordination of care for the patient  regarding right breast invasive ductal carcinoma with axillary metastases.Marland Kitchen

## 2020-11-20 NOTE — Progress Notes (Signed)
START ON PATHWAY REGIMEN - Breast     Cycles 1 through 4: A cycle is every 21 days:     Pembrolizumab      Paclitaxel      Carboplatin      Filgrastim-xxxx    Cycles 5 through 8: A cycle is every 21 days:     Pembrolizumab      Doxorubicin      Cyclophosphamide      Pegfilgrastim-xxxx   **Always confirm dose/schedule in your pharmacy ordering system**  Patient Characteristics: Preoperative or Nonsurgical Candidate (Clinical Staging), Neoadjuvant Therapy followed by Surgery, Invasive Disease, Chemotherapy, HER2 Negative/Unknown/Equivocal, ER Negative/Unknown, Platinum Therapy Indicated and Candidate for Checkpoint Inhibitor Therapeutic Status: Preoperative or Nonsurgical Candidate (Clinical Staging) AJCC M Category: cM0 AJCC Grade: G3 Breast Surgical Plan: Neoadjuvant Therapy followed by Surgery ER Status: Negative (-) AJCC 8 Stage Grouping: IIB HER2 Status: Negative (-) AJCC T Category: cT1c AJCC N Category: cN1 PR Status: Negative (-) Intent of Therapy: Curative Intent, Discussed with Patient

## 2020-11-22 ENCOUNTER — Encounter (HOSPITAL_BASED_OUTPATIENT_CLINIC_OR_DEPARTMENT_OTHER): Payer: Self-pay | Admitting: Surgery

## 2020-11-22 ENCOUNTER — Other Ambulatory Visit: Payer: Self-pay

## 2020-11-22 ENCOUNTER — Telehealth: Payer: Self-pay | Admitting: Oncology

## 2020-11-22 NOTE — Telephone Encounter (Signed)
Scheduled per 8/10 los. Pt will receive an updated appt calendar at next appt per appt notes

## 2020-11-25 ENCOUNTER — Telehealth: Payer: Self-pay | Admitting: *Deleted

## 2020-11-25 ENCOUNTER — Encounter (HOSPITAL_BASED_OUTPATIENT_CLINIC_OR_DEPARTMENT_OTHER)
Admission: RE | Admit: 2020-11-25 | Discharge: 2020-11-25 | Disposition: A | Discharge status: Home / Self Care | Payer: Managed Care, Other (non HMO) | Source: Ambulatory Visit | Attending: Surgery | Admitting: Surgery

## 2020-11-25 ENCOUNTER — Encounter: Payer: Self-pay | Admitting: *Deleted

## 2020-11-25 DIAGNOSIS — Z01810 Encounter for preprocedural cardiovascular examination: Secondary | ICD-10-CM | POA: Diagnosis not present

## 2020-11-25 NOTE — Progress Notes (Signed)

## 2020-11-25 NOTE — Telephone Encounter (Signed)
Spoke to pt concerning [place] from 8.10.22. Denies questions or concerns regarding dx or treatment care plan. Confirmed future appts. Encourage pt to call with needs. Received verbal understanding.

## 2020-11-26 ENCOUNTER — Inpatient Hospital Stay: Payer: Managed Care, Other (non HMO)

## 2020-11-26 ENCOUNTER — Ambulatory Visit (HOSPITAL_COMMUNITY)
Admission: RE | Admit: 2020-11-26 | Discharge: 2020-11-26 | Disposition: A | Discharge status: Home / Self Care | Payer: Managed Care, Other (non HMO) | Source: Ambulatory Visit | Attending: Oncology | Admitting: Oncology

## 2020-11-26 ENCOUNTER — Other Ambulatory Visit: Payer: Self-pay

## 2020-11-26 ENCOUNTER — Encounter: Payer: Self-pay | Admitting: *Deleted

## 2020-11-26 ENCOUNTER — Ambulatory Visit (HOSPITAL_BASED_OUTPATIENT_CLINIC_OR_DEPARTMENT_OTHER)
Admission: RE | Admit: 2020-11-26 | Discharge: 2020-11-26 | Disposition: A | Discharge status: Home / Self Care | Payer: Managed Care, Other (non HMO) | Source: Ambulatory Visit | Attending: Oncology | Admitting: Oncology

## 2020-11-26 DIAGNOSIS — C50411 Malignant neoplasm of upper-outer quadrant of right female breast: Secondary | ICD-10-CM

## 2020-11-26 DIAGNOSIS — Z01818 Encounter for other preprocedural examination: Secondary | ICD-10-CM | POA: Insufficient documentation

## 2020-11-26 DIAGNOSIS — E119 Type 2 diabetes mellitus without complications: Secondary | ICD-10-CM | POA: Insufficient documentation

## 2020-11-26 DIAGNOSIS — Z170 Estrogen receptor positive status [ER+]: Secondary | ICD-10-CM | POA: Diagnosis not present

## 2020-11-26 DIAGNOSIS — E785 Hyperlipidemia, unspecified: Secondary | ICD-10-CM | POA: Diagnosis not present

## 2020-11-26 DIAGNOSIS — Z0189 Encounter for other specified special examinations: Secondary | ICD-10-CM | POA: Diagnosis not present

## 2020-11-26 LAB — ECHOCARDIOGRAM COMPLETE
Area-P 1/2: 3.19 cm2
S' Lateral: 2.9 cm

## 2020-11-26 MED ORDER — GADOBUTROL 1 MMOL/ML IV SOLN
7.0000 mL | Freq: Once | INTRAVENOUS | Status: AC | PRN
  Administered 2020-11-26: 7 mL via INTRAVENOUS

## 2020-11-26 NOTE — Progress Notes (Signed)
  Echocardiogram 2D Echocardiogram has been performed.  Robin Farley 11/26/2020, 10:00 AM

## 2020-11-26 NOTE — Progress Notes (Signed)
Pharmacist Chemotherapy Monitoring - Initial Assessment    Anticipated start date: 12/03/20   The following has been reviewed per standard work regarding the patient's treatment regimen: The patient's diagnosis, treatment plan and drug doses, and organ/hematologic function Lab orders and baseline tests specific to treatment regimen  The treatment plan start date, drug sequencing, and pre-medications Prior authorization status  Patient's documented medication list, including drug-drug interaction screen and prescriptions for anti-emetics and supportive care specific to the treatment regimen The drug concentrations, fluid compatibility, administration routes, and timing of the medications to be used The patient's access for treatment and lifetime cumulative dose history, if applicable  The patient's medication allergies and previous infusion related reactions, if applicable   Changes made to treatment plan:  N/A  Follow up needed:  Pending authorization for treatment  ECHO pending.   Kennith Center, Pharm.D., CPP 11/26/2020'@3'$ :56 PM

## 2020-11-27 ENCOUNTER — Encounter: Payer: Self-pay | Admitting: *Deleted

## 2020-11-27 ENCOUNTER — Other Ambulatory Visit: Payer: Self-pay | Admitting: *Deleted

## 2020-11-27 ENCOUNTER — Other Ambulatory Visit: Payer: Self-pay | Admitting: Oncology

## 2020-11-27 DIAGNOSIS — R928 Other abnormal and inconclusive findings on diagnostic imaging of breast: Secondary | ICD-10-CM

## 2020-11-27 DIAGNOSIS — C50411 Malignant neoplasm of upper-outer quadrant of right female breast: Secondary | ICD-10-CM

## 2020-11-27 NOTE — Progress Notes (Signed)
Lab/flush appt canceled tomorrow per no need for lab at this time. Had labs 11/20/2020 and will have labs again on 12/03/2020 prior to chemotherapy

## 2020-11-28 ENCOUNTER — Inpatient Hospital Stay (HOSPITAL_BASED_OUTPATIENT_CLINIC_OR_DEPARTMENT_OTHER): Payer: Managed Care, Other (non HMO) | Admitting: Oncology

## 2020-11-28 ENCOUNTER — Inpatient Hospital Stay: Payer: Managed Care, Other (non HMO)

## 2020-11-28 ENCOUNTER — Other Ambulatory Visit: Payer: Self-pay

## 2020-11-28 VITALS — BP 151/71 | HR 77 | Temp 98.1°F | Resp 16 | Ht 66.0 in | Wt 154.8 lb

## 2020-11-28 DIAGNOSIS — C50411 Malignant neoplasm of upper-outer quadrant of right female breast: Secondary | ICD-10-CM | POA: Diagnosis not present

## 2020-11-28 DIAGNOSIS — Z171 Estrogen receptor negative status [ER-]: Secondary | ICD-10-CM | POA: Diagnosis not present

## 2020-11-28 DIAGNOSIS — Z5112 Encounter for antineoplastic immunotherapy: Secondary | ICD-10-CM | POA: Diagnosis not present

## 2020-11-28 DIAGNOSIS — C4370 Malignant melanoma of unspecified lower limb, including hip: Secondary | ICD-10-CM | POA: Diagnosis not present

## 2020-11-28 MED ORDER — ZOLPIDEM TARTRATE 5 MG PO TABS: 5.0000 mg | ORAL_TABLET | Freq: Every evening | ORAL | 0 refills | Status: DC | PRN

## 2020-11-28 NOTE — Progress Notes (Signed)
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 She was evaluated in the multidisciplinary breast cancer clinic on 11/20/2020.  She is accompanied by her daughter and a friend.  Since consultation, she underwent breast MRI on 11/26/2020 showing: breast composition B; 2.4 cm biopsy-proven malignancy in upper-outer right breast with associated skin tethering; additional suspicious foci and linear non-mass enhancement in outer right breast extending anterior from mass towards nipple; additional 0.6 cm focus of enhancement in upper-inner right breast; biopsy-proven metastatic lymph node, with two additional suspicious lymph nodes, one in low right axilla and one in intramammary at level of primary tumor; no evidence of malignancy in left breast.  She is scheduled for additional right breast biopsies on 12/09/2020.  She also underwent echocardiogram on 11/26/2020 showing an ejection fraction of 64%.  Finally she met with our chemotherapy teaching nurse.  There were very pleased with the amount of information they received.  They had a good understanding of how to take other supportive meds.   REVIEW OF SYSTEMS: Nadia has got a wig.  I thought we had discussed the technique But apparently we had not and we  discussed that today.  She has stopped her Adderall.  A detailed review of systems today was otherwise stable.   COVID 19 VACCINATION STATUS: [place] x2, most recently 11/2019; infection 07/2020   HISTORY OF CURRENT ILLNESS: From the original intake note:  [name] presented with a new palpable lump in the right breast. She underwent bilateral diagnostic mammography with tomography and right breast ultrasonography at [facility] on 11/06/2020 showing: breast density category B; 1.9 cm mass at palpable site of concern in right breast at 10 o'clock; 0.8 cm smaller mass in right breast at 9 o'clock; 0.7 cm small mass in right axilla, possibly a replaced lymph node; second mildly suspicious lymph node in axilla.  Accordingly on 11/08/2020 she proceeded to biopsy of the right breast area in question. The pathology from this procedure ([id number]) showed:  1. Right Breast, 10 o'clock - invasive ductal carcinoma, grade 3  - Prognostic indicators significant for: estrogen receptor, 5% "positive "with weak staining intensity (this is functionally negative) and progesterone receptor, 0% negative. Proliferation marker Ki67 at 30%. HER2 equivocal by immunohistochemistry (2+), but negative by fluorescent in situ hybridization with a signals ratio 1.19 and number per cell 1.6. 2. Right Breast, 9 o'clock  - fibrocystic change 3. Right Axilla, lymph node  - metastatic carcinoma involving a lymph node  Cancer Staging Malignant neoplasm of upper-outer quadrant of right breast in female, estrogen receptor negative ([place]) Staging form: Breast, AJCC 8th Edition - Clinical: Stage IIB (cT1c, cN1, cM0, G3, ER-, PR-, HER2-) - Signed by [name], MD on 11/20/2020 Histologic grading system: 3 grade system  The patient's subsequent history is as detailed below.   PAST MEDICAL HISTORY: Past Medical History:  Diagnosis Date  Abdominal pain, generalized 12/27/2008   Acute sinusitis, unspecified  02/26/2007   ADD 03/13/2010   ALLERGIC RHINITIS 02/23/2007   ANEMIA 02/05/2007   Anxiety 11/17/2011   ATTENTION DEFICIT DISORDER, HX OF 02/05/2007   Breast cancer ([place])    Deviated nasal septum 02/05/2007   Diabetes mellitus without complication ([place])    Family history of breast cancer 11/20/2020   Family history of melanoma 11/20/2020   FATIGUE 02/23/2007   HYPERLIPIDEMIA 02/23/2007   Personal history of malignant melanoma 11/20/2020   SWELLING MASS OR LUMP IN HEAD AND NECK 07/27/2008   UTI 02/23/2007    PAST SURGICAL HISTORY: Past Surgical History:  Procedure Laterality Date   BROW LIFT     [place]   [place]   MELANOMA EXCISION WITH SENTINEL LYMPH NODE BIOPSY Left 10/24/2012   Procedure: MELANOMA wide EXCISION left lateral thigh WITH SENTINEL LYMPH NODE BIOPSY left groin;  Surgeon: Edward Jolly, MD;  Location: [facility];  Service: General;  Laterality: Left;   NASAL SEPTUM SURGERY      FAMILY HISTORY: Family History  Problem Relation Age of Onset   Hypertension Mother    Breast cancer Mother 58   COPD Father    Atrial fibrillation Father    Atrial fibrillation Sister    Kidney cancer Maternal Aunt        dx 50s   Leukemia Paternal Aunt        dx after 62   Cancer Paternal Aunt        unknown type; dx after 20   Brain cancer Paternal Grandfather 73       brain tumor per pt   Melanoma Cousin        arm; no mets per pt   Alcohol abuse Other    Depression Other    Glaucoma Other    Breast cancer Other        MGM's sisters, x3, dx after 3   Colon polyps Neg Hx    Her father died at age 9 from COPD. Her mother is currently 79 years old as of 11/2020. Lunabelle has no full siblings and three half-siblings-- two brothers and one sister. She reports breast cancer in her mother and three great-aunts, brain tumor in her paternal grandfather, and metastatic melanoma in a cousin.   GYNECOLOGIC HISTORY:  Patient's  last menstrual period was 04/27/2016. Menarche: [age] years old Age at first live birth: 55 years old [place] P 2 LMP 2016 Contraceptive used for 1-2 years HRT estrogen cream irregularly Hysterectomy? no BSO? 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 She lives in [place], [place] but travels here frequently for work. Husband [name] works in Press photographer for [employer]. She lives at home with [name] and their daughter [name], who is 55 and is a Ship broker at [employer]. Daughter [name], age 29, is a Secretary/administrator in [place]. [place], [place]. Caleigha is not a Designer, fashion/clothing.    ADVANCED DIRECTIVES: In the absence of any documentation to the contrary, the patient's spouse is their HCPOA.    HEALTH MAINTENANCE: Social History   Tobacco Use   Smoking status: Never   Smokeless tobacco: Never  Substance Use Topics   Alcohol use: Yes    Comment: Very rare occasions   Drug use: Never     Colonoscopy: 12/2014 (Dr. Henrene Pastor)  PAP: 2020  Bone density: unsure  Allergies  Allergen Reactions   Promethazine Hcl     Legs twitch    Current Outpatient Medications  Medication Sig Dispense Refill   zolpidem (AMBIEN) 5 MG tablet Take 1 tablet (5 mg total) by mouth at bedtime as needed for sleep. 20 tablet 0   amphetamine-dextroamphetamine (ADDERALL) 20 MG tablet Take 1 and 1/2 tablet by mouth twice per day 90 tablet 0   aspirin 81 MG EC tablet Take 81 mg by mouth daily.     atorvastatin (LIPITOR) 20 MG tablet Take 1 tablet (20 mg total) by mouth daily. 90 tablet 3   Cholecalciferol (THERA-D 2000) 50 MCG (2000 UT) TABS 1 tab by mouth once daily 30 tablet 99   dexamethasone (DECADRON) 4 MG tablet Take 2 tablets once a day for 3 days after carboplatin and AC chemotherapy. Take with food. 30 tablet 1   Emollient (ROC RETINOL CORREXION EYE) CREA Use as directed daily 15 mL 11   lidocaine-prilocaine (EMLA) cream Apply to affected area once 30  g 3   metFORMIN (GLUCOPHAGE-XR) 500 MG 24 hr tablet Take 1 tablet (500 mg total) by mouth daily with breakfast. 90 tablet 3   prochlorperazine (COMPAZINE) 10 MG tablet Take 1 tablet (10 mg total) by mouth every 6 (six) hours as needed (Nausea or vomiting). 30 tablet 1   No current facility-administered medications for this visit.    OBJECTIVE: White woman who appears younger than stated age  25:   11/28/20 1553  BP: (!) 151/71  Pulse: 77  Resp: 16  Temp: 98.1 F (36.7 C)  SpO2: 100%     Body mass index is 24.99 kg/m.   Wt Readings from Last 3 Encounters:  11/28/20 154 lb 12.8 oz (70.2 kg)  11/20/20 154 lb 4.8 oz (70 kg)  06/10/20 147 lb (66.7 kg)      ECOG FS:1 - Symptomatic but completely ambulatory  Sclerae unicteric, EOMs intact Wearing a mask No cervical or supraclavicular adenopathy Lungs no rales or rhonchi Heart regular rate and rhythm Abd soft, nontender, positive bowel sounds MSK no focal spinal tenderness, no upper extremity lymphedema Neuro: nonfocal, well oriented, appropriate affect Breasts: Deferred  LAB RESULTS:  CMP     Component Value Date/Time   NA 145 11/20/2020 0839   K 3.7 11/20/2020 0839   CL 108 11/20/2020 0839   CO2 26 11/20/2020 0839   GLUCOSE 71 11/20/2020 0839   BUN 19 11/20/2020 0839   CREATININE 0.92 11/20/2020 0839   CALCIUM 10.3 11/20/2020 0839   PROT 7.3 11/20/2020 0839   ALBUMIN 4.0 11/20/2020 0839   AST 21 11/20/2020 0839   ALT 25 11/20/2020 0839   ALKPHOS 79 11/20/2020 0839   BILITOT 0.4 11/20/2020 0839   GFRNONAA >60 11/20/2020 0839   GFRAA 100 03/23/2008 1212    No results found for: TOTALPROTELP, ALBUMINELP, A1GS, A2GS, BETS, BETA2SER, GAMS, MSPIKE, SPEI  Lab Results  Component Value Date   WBC 4.5 11/20/2020   NEUTROABS 2.7 11/20/2020   HGB 13.5 11/20/2020   HCT 39.4 11/20/2020   MCV 90.6 11/20/2020   PLT 251 11/20/2020    No results found for: LABCA2  No components found for: KKXFGH829  No results  for input(s): INR in the last 168 hours.  No results found for: LABCA2  No results found for: HBZ169  No results found for: CVE938  No results found for: BOF751  No results found for: CA2729  No components found for: HGQUANT  No results found for:  CEA1 / No results found for: CEA1   No results found for: AFPTUMOR  No results found for: CHROMOGRNA  No results found for: KPAFRELGTCHN, LAMBDASER, KAPLAMBRATIO (kappa/lambda light chains)  No results found for: HGBA, HGBA2QUANT, HGBFQUANT, HGBSQUAN (Hemoglobinopathy evaluation)   No results found for: LDH  Lab Results  Component Value Date   IRON 93 12/16/2018   IRONPCTSAT 25.6 12/16/2018   (Iron and TIBC)  No results found for: FERRITIN  Urinalysis    Component Value Date/Time   COLORURINE YELLOW 12/20/2019 1521   APPEARANCEUR CLEAR 12/20/2019 1521   LABSPEC 1.017 12/20/2019 1521   PHURINE 6.5 12/20/2019 1521   GLUCOSEU NEGATIVE 12/20/2019 1521   GLUCOSEU NEGATIVE 12/16/2018 1512   HGBUR NEGATIVE 12/20/2019 1521   BILIRUBINUR NEGATIVE 12/16/2018 1512   KETONESUR NEGATIVE 12/20/2019 1521   PROTEINUR NEGATIVE 12/20/2019 1521   UROBILINOGEN 0.2 12/16/2018 1512   NITRITE NEGATIVE 12/20/2019 1521   LEUKOCYTESUR NEGATIVE 12/20/2019 1521     STUDIES: MR BREAST BILATERAL W WO CONTRAST INC CAD  Result Date: 11/26/2020 CLINICAL DATA:  55 year old female with newly diagnosed invasive right breast carcinoma and positive axillary node. LABS:  None. EXAM: BILATERAL BREAST MRI WITH AND WITHOUT CONTRAST TECHNIQUE: Multiplanar, multisequence MR images of both breasts were obtained prior to and following the intravenous administration of 7 ml of Gadavist Three-dimensional MR images were rendered by post-processing of the original MR data on an independent workstation. The three-dimensional MR images were interpreted, and findings are reported in the following complete MRI report for this study. Three dimensional images were  evaluated at the independent interpreting workstation using the DynaCAD thin client. COMPARISON:  None. FINDINGS: Breast composition: b. Scattered fibroglandular tissue. Background parenchymal enhancement: Mild Right breast: There is susceptibility artifact in the upper outer right breast consistent with a biopsy marking clip associated with this is an irregular spiculated enhancing mass measuring 2.4 x 1.3 x 2.1 cm (series 7, image 65). There is overlying tethering to the skin. No abnormal skin thickening or enhancement. There are additional suspicious foci and non mass enhancement extending anteriorly from the mass towards the nipple. The linear non mass enhancement extending to the base of the nipple spans approximately 1.8 cm (series 11, image 70). This is located approximately 5.2 cm anterior to the dominant mass. There is an additional focus of enhancement in the upper inner anterior aspect of the breast (series 11, image 61) measuring 0.6 cm. There is subtle clumped non mass enhancement in the lower outer right breast with susceptibility artifact at the posterior aspect, representing the site of recently biopsied fibrocystic changes. Left breast: No mass or abnormal enhancement. Lymph nodes: There is a small irregular lymph node with associated susceptibility artifact in the room low right axilla (series 11, image 51), consistent with biopsy proven metastatic node. There is 1 additional abnormal lymph node with cortical thickness measuring 0.6 cm (series 2, image 11). There is also a mildly asymmetric suspicious intramammary lymph node measuring 0.7 cm (series 6, image 77). No left axillary adenopathy. Ancillary findings:  None. IMPRESSION: 1. Biopsy-proven malignancy in the upper outer right breast measuring 2.4 x 1.3 x 2.1 cm. There is associated skin tethering. There are additional suspicious foci and linear non mass enhancement in the outer right breast extending anteriorly from the mass towards the  nipple. The linear non mass enhancement extending to the base of the nipple spans approximately 1.8 cm. This is located approximately 5.2 cm anterior to the dominant mass. 2. Additional focus of enhancement  in the upper inner anterior aspect of the right breast measuring 0.6 cm. 3. Biopsy-proven metastatic lymph node in the low right axilla with 1 additional suspicious lymph node. There is also an asymmetric, suspicious intramammary lymph node at the level of the biopsy improved malignancy in the upper outer quadrant. 4. No MRI evidence of malignancy in the left breast. RECOMMENDATION: If breast conservation is being considered recommend MRI guided biopsy x 2 of the retroareolar linear non mass enhancement and of the focus of enhancement in the upper inner right breast. BI-RADS CATEGORY  4: Suspicious. Electronically Signed   By: Audie Pinto M.D.   On: 11/26/2020 14:43  US BREAST LTD UNI RIGHT INC AXILLA  Result Date: 11/06/2020 CLINICAL DATA:  55 year old female presenting with a new lump in the right breast. Patient states the lump has been present for several months. EXAM: DIGITAL DIAGNOSTIC BILATERAL MAMMOGRAM WITH TOMOSYNTHESIS AND CAD; ULTRASOUND RIGHT BREAST LIMITED TECHNIQUE: Bilateral digital diagnostic mammography and breast tomosynthesis was performed. The images were evaluated with computer-aided detection.; Targeted ultrasound examination of the right breast was performed COMPARISON:  Previous exam(s). ACR Breast Density Category b: There are scattered areas of fibroglandular density. FINDINGS: Mammogram: Right breast: A skin BB marks the palpable site of concern in the upper outer right breast. A spot tangential view of this area is performed in addition to standard views. There is an irregular spiculated mass with associated calcifications measuring approximately 1.7 cm. There is an additional smaller irregular mass measuring approximately 0.8 cm also in the outer breast just inferior to the  above described mass. Left breast: No suspicious mass, distortion, or microcalcifications are identified to suggest presence of malignancy. On physical exam at the site of concern in the upper-outer right breast I feel a fixed discrete mass. Ultrasound: Targeted ultrasound performed at the palpable site of concern in the right breast at the 10 o'clock 7 cm from the nipple demonstrating an irregular hypoechoic mass measuring 1.9 x 1.1 x 1.7 cm. There is internal vascularity. This corresponds to the mammographic finding. At 9 o'clock 6 cm from the nipple there is an irregular hypoechoic mass measuring 0.8 x 0.4 x 0.8 cm. No internal vascularity. This corresponds to the second mass identified mammographically. Targeted ultrasound the right axilla demonstrates a small irregular hypoechoic mass labeled #1 measuring 0.6 x 0.5 x 0.7 cm, possibly a replaced lymph node. There is a lymph node with mildly thickened cortex measuring 0.5 cm labeled #2. IMPRESSION: 1. Highly suspicious mass at the palpable site of concern in the right breast at 10 o'clock measuring 1.9 cm. 2. Suspicious smaller mass in the right breast at 9 o'clock measuring 0.8 cm. 3. Suspicious small mass in the right axilla measuring 0.7 cm, possibly a replaced lymph node. There is a second mildly suspicious lymph node in the axilla. RECOMMENDATION: Ultrasound-guided core needle biopsy x3 of the right breast mass at 10 o'clock, of the right breast mass at 9 o'clock and of the small mass in the right axilla measuring 0.7 cm. I have discussed the findings and recommendations with the patient who agrees to proceed with biopsy. The patient will be scheduled for the biopsy appointment prior to leaving the office today. BI-RADS CATEGORY  5: Highly suggestive of malignancy. Electronically Signed   By: Audie Pinto M.D.   On: 11/06/2020 13:25  MM DIAG BREAST TOMO BILATERAL  Result Date: 11/06/2020 CLINICAL DATA:  55 year old female presenting with a new lump  in the right breast. Patient states the  lump has been present for several months. EXAM: DIGITAL DIAGNOSTIC BILATERAL MAMMOGRAM WITH TOMOSYNTHESIS AND CAD; ULTRASOUND RIGHT BREAST LIMITED TECHNIQUE: Bilateral digital diagnostic mammography and breast tomosynthesis was performed. The images were evaluated with computer-aided detection.; Targeted ultrasound examination of the right breast was performed COMPARISON:  Previous exam(s). ACR Breast Density Category b: There are scattered areas of fibroglandular density. FINDINGS: Mammogram: Right breast: A skin BB marks the palpable site of concern in the upper outer right breast. A spot tangential view of this area is performed in addition to standard views. There is an irregular spiculated mass with associated calcifications measuring approximately 1.7 cm. There is an additional smaller irregular mass measuring approximately 0.8 cm also in the outer breast just inferior to the above described mass. Left breast: No suspicious mass, distortion, or microcalcifications are identified to suggest presence of malignancy. On physical exam at the site of concern in the upper-outer right breast I feel a fixed discrete mass. Ultrasound: Targeted ultrasound performed at the palpable site of concern in the right breast at the 10 o'clock 7 cm from the nipple demonstrating an irregular hypoechoic mass measuring 1.9 x 1.1 x 1.7 cm. There is internal vascularity. This corresponds to the mammographic finding. At 9 o'clock 6 cm from the nipple there is an irregular hypoechoic mass measuring 0.8 x 0.4 x 0.8 cm. No internal vascularity. This corresponds to the second mass identified mammographically. Targeted ultrasound the right axilla demonstrates a small irregular hypoechoic mass labeled #1 measuring 0.6 x 0.5 x 0.7 cm, possibly a replaced lymph node. There is a lymph node with mildly thickened cortex measuring 0.5 cm labeled #2. IMPRESSION: 1. Highly suspicious mass at the palpable site  of concern in the right breast at 10 o'clock measuring 1.9 cm. 2. Suspicious smaller mass in the right breast at 9 o'clock measuring 0.8 cm. 3. Suspicious small mass in the right axilla measuring 0.7 cm, possibly a replaced lymph node. There is a second mildly suspicious lymph node in the axilla. RECOMMENDATION: Ultrasound-guided core needle biopsy x3 of the right breast mass at 10 o'clock, of the right breast mass at 9 o'clock and of the small mass in the right axilla measuring 0.7 cm. I have discussed the findings and recommendations with the patient who agrees to proceed with biopsy. The patient will be scheduled for the biopsy appointment prior to leaving the office today. BI-RADS CATEGORY  5: Highly suggestive of malignancy. Electronically Signed   By: Audie Pinto M.D.   On: 11/06/2020 13:25  ECHOCARDIOGRAM COMPLETE  Result Date: 11/26/2020    ECHOCARDIOGRAM REPORT   Patient Name:   KYERRA VARGO Date of Exam: 11/26/2020 Medical Rec #:  [id number]       Height:       66.0 in Accession #:    [id number]      Weight:       154.0 lb Date of Birth:  [birth year]       BSA:          1.790 m Patient Age:    26 years        BP:           133/82 mmHg Patient Gender: F               HR:           71 bpm. Exam Location:  Outpatient Procedure: 2D Echo, 3D Echo, Cardiac Doppler, Color Doppler and Strain Analysis Indications:    Z51.11 Encounter for antineoplastic  chemotheraphy  History:        Patient has no prior history of Echocardiogram examinations.                 Risk Factors:Diabetes and Dyslipidemia. Cancer.  Sonographer:    Tiffany Dance RVT Referring Phys: [id number] Thuan Tippett C Saia Derossett IMPRESSIONS  1. Left ventricular ejection fraction by 3D volume is 64 %. The left ventricle has normal function. The left ventricle has no regional wall motion abnormalities. Left ventricular diastolic parameters were normal.  2. Right ventricular systolic function is normal. The right ventricular size is normal.  3. The mitral  valve is normal in structure. Trivial mitral valve regurgitation. No evidence of mitral stenosis.  4. The aortic valve is normal in structure. Aortic valve regurgitation is not visualized. No aortic stenosis is present. FINDINGS  Left Ventricle: Left ventricular ejection fraction by 3D volume is 64 %. The left ventricle has normal function. The left ventricle has no regional wall motion abnormalities. The left ventricular internal cavity size was normal in size. There is no left  ventricular hypertrophy. Left ventricular diastolic parameters were normal. Right Ventricle: The right ventricular size is normal. Right vetricular wall thickness was not well visualized. Right ventricular systolic function is normal. Left Atrium: Left atrial size was normal in size. Right Atrium: Right atrial size was normal in size. Pericardium: There is no evidence of pericardial effusion. Mitral Valve: The mitral valve is normal in structure. Trivial mitral valve regurgitation. No evidence of mitral valve stenosis. Tricuspid Valve: The tricuspid valve is grossly normal. Tricuspid valve regurgitation is not demonstrated. Aortic Valve: The aortic valve is normal in structure. Aortic valve regurgitation is not visualized. No aortic stenosis is present. Pulmonic Valve: The pulmonic valve was normal in structure. Pulmonic valve regurgitation is not visualized. Aorta: The aortic root and ascending aorta are structurally normal, with no evidence of dilitation. IAS/Shunts: The atrial septum is grossly normal.  LEFT VENTRICLE PLAX 2D LVIDd:         4.60 cm         Diastology LVIDs:         2.90 cm         LV e' medial:    5.98 cm/s LV PW:         1.00 cm         LV E/e' medial:  9.1 LV IVS:        1.00 cm         LV e' lateral:   9.03 cm/s LVOT diam:     2.10 cm         LV E/e' lateral: 6.1 LV SV:         60 LV SV Index:   34              2D LVOT Area:     3.46 cm        Longitudinal                                Strain                                 2D Strain GLS  -22.4 %  Avg:                                 3D Volume EF                                LV 3D EF:    Left                                             ventricul                                             ar                                             ejection                                             fraction                                             by 3D                                             volume is                                             64 %.                                 3D Volume EF:                                3D EF:        64 %                                LV EDV:       95 ml                                LV ESV:       34 ml                                LV SV:        61 ml RIGHT VENTRICLE  IVC RV Basal diam:  2.70 cm    IVC diam: 1.60 cm RV S prime:     8.70 cm/s TAPSE (M-mode): 1.7 cm LEFT ATRIUM             Index       RIGHT ATRIUM           Index LA diam:        3.40 cm 1.90 cm/m  RA Area:     12.90 cm LA Vol (A2C):   41.6 ml 23.25 ml/m RA Volume:   28.10 ml  15.70 ml/m LA Vol (A4C):   38.0 ml 21.23 ml/m LA Biplane Vol: 40.5 ml 22.63 ml/m  AORTIC VALVE             PULMONIC VALVE LVOT Vmax:   85.10 cm/s  PR End Diast Vel: 2.23 msec LVOT Vmean:  54.800 cm/s LVOT VTI:    0.174 m  AORTA Ao Root diam: 4.00 cm Ao Asc diam:  3.40 cm MITRAL VALVE MV Area (PHT): 3.19 cm    SHUNTS MV Decel Time: 238 msec    Systemic VTI:  0.17 m MV E velocity: 54.70 cm/s  Systemic Diam: 2.10 cm MV A velocity: 59.60 cm/s MV E/A ratio:  0.92 Mertie Moores MD Electronically signed by Mertie Moores MD Signature Date/Time: 11/26/2020/5:22:55 PM    Final    [place] AXILLARY NODE CORE BIOPSY RIGHT  Addendum Date: 11/12/2020   ADDENDUM REPORT: 11/11/2020 14:28 ADDENDUM: 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 This was found to be concordant by Dr. Lovey Newcomer. Pathology revealed  FIBROCYSTIC CHANGE- NEGATIVE FOR CARCINOMA of the RIGHT breast, 9 o'clock (coil clip). This was found to be concordant by Dr. Lovey Newcomer. Pathology revealed METASTATIC CARCINOMA INVOLVING A LYMPH NODE of the RIGHT axilla, (heart clip). This was found to be concordant by Dr. Lovey Newcomer. Pathology results were discussed with the patient by telephone. The patient reported doing well after the biopsies with tenderness at the sites. Post biopsy instructions and care were reviewed and questions were answered. The patient was encouraged to call [facility] [place] for any additional concerns. The patient was referred to [facility] on November 20, 2020. Pathology results reported by Stacie Acres RN on 11/11/2020. Electronically Signed   By: Lovey Newcomer M.D.   On: 11/11/2020 14:28   Result Date: 11/12/2020 CLINICAL DATA:  Patient with indeterminate right breast masses at the 10 o'clock and 9 o'clock position and low right axilla. EXAM: ULTRASOUND GUIDED RIGHT BREAST CORE NEEDLE BIOPSY COMPARISON:  Previous exam(s). PROCEDURE: I met with the patient and we discussed the procedure of ultrasound-guided biopsy, including benefits and alternatives. We discussed the high likelihood of a successful procedure. We discussed the risks of the procedure, including infection, bleeding, tissue injury, clip migration, and inadequate sampling. Informed written consent was given. The usual time-out protocol was performed immediately prior to the procedure. Site 1: Right breast 10 o'clock position: Ribbon shaped clip Lesion quadrant: Upper outer quadrant Using sterile technique and 1% Lidocaine as local anesthetic, under direct ultrasound visualization, a 14 gauge spring-loaded device was used to perform biopsy of right breast mass 10 o'clock position using a lateral approach. At the conclusion of the procedure coil shaped tissue marker clip was  deployed into the biopsy cavity. Follow up 2 view mammogram was performed and dictated separately. Site 2: Right breast  9 o'clock position: Coil clip Lesion quadrant: Lower outer quadrant Using sterile technique and 1% Lidocaine as local anesthetic, under direct ultrasound visualization, a 14 gauge spring-loaded device was used to perform biopsy of right breast 9 o'clock mass using a lateral approach. At the conclusion of the procedure coil shaped tissue marker clip was deployed into the biopsy cavity. Follow up 2 view mammogram was performed and dictated separately. Site 3: Low right axilla: Heart clip Lesion quadrant: Upper outer quadrant Using sterile technique and 1% Lidocaine as local anesthetic, under direct ultrasound visualization, a 14 gauge spring-loaded device was used to perform biopsy of low right axillary mass using a lateral approach. At the conclusion of the procedure heart shaped tissue marker clip was deployed into the biopsy cavity. Follow up 2 view mammogram was performed and dictated separately. IMPRESSION: Ultrasound guided biopsy of 2 right breast masses and low right axillary mass. No apparent complications. Electronically Signed: By: Lovey Newcomer M.D. On: 11/08/2020 10:28  MM CLIP PLACEMENT RIGHT  Result Date: 11/08/2020 CLINICAL DATA:  Patient status post ultrasound-guided core needle biopsy 2 right breast masses and right axillary node. EXAM: 3D DIAGNOSTIC RIGHT MAMMOGRAM POST ULTRASOUND BIOPSY COMPARISON:  Previous exam(s). FINDINGS: 3D Mammographic images were obtained following ultrasound guided biopsy of 2 right breast masses and low right axillary node. Site 1: Right breast mass 10 o'clock position: Ribbon shaped clip: In appropriate position. Site 2: Right breast mass 9 o'clock position: Coil shaped clip: In appropriate position. Site 3: Right axilla: Heart shaped clip: In appropriate position. IMPRESSION: Appropriate positioning of the biopsy clips after biopsies as above. Final  Assessment: Post Procedure Mammograms for Marker Placement Electronically Signed   By: Lovey Newcomer M.D.   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 This was found to be concordant by Dr. Lovey Newcomer. Pathology revealed FIBROCYSTIC CHANGE- NEGATIVE FOR CARCINOMA of the RIGHT breast, 9 o'clock (coil clip). This was found to be concordant by Dr. Lovey Newcomer. Pathology revealed METASTATIC CARCINOMA INVOLVING A LYMPH NODE of the RIGHT axilla, (heart clip). This was found to be concordant by Dr. Lovey Newcomer. Pathology results were discussed with the patient by telephone. The patient reported doing well after the biopsies with tenderness at the sites. Post biopsy instructions and care were reviewed and questions were answered. The patient was encouraged to call [facility] for any additional concerns. The patient was referred to [facility] on November 20, 2020. Pathology results reported by Stacie Acres RN on 11/11/2020. Electronically Signed   By: Lovey Newcomer M.D.   On: 11/11/2020 14:28   Result Date: 11/12/2020 CLINICAL DATA:  Patient with indeterminate right breast masses at the 10 o'clock and 9 o'clock position and low right axilla. EXAM: ULTRASOUND GUIDED RIGHT BREAST CORE NEEDLE BIOPSY COMPARISON:  Previous exam(s). PROCEDURE: I met with the patient and we discussed the procedure of ultrasound-guided biopsy, including benefits and alternatives. We discussed the high likelihood of a successful procedure. We discussed the risks of the procedure, including infection, bleeding, tissue injury, clip migration, and inadequate sampling. Informed written consent was given. The usual time-out protocol was performed  immediately prior to the procedure. Site 1: Right breast 10 o'clock position: Ribbon shaped  clip Lesion quadrant: Upper outer quadrant Using sterile technique and 1% Lidocaine as local anesthetic, under direct ultrasound visualization, a 14 gauge spring-loaded device was used to perform biopsy of right breast mass 10 o'clock position using a lateral approach. At the conclusion of the procedure coil shaped tissue marker clip was deployed into the biopsy cavity. Follow up 2 view mammogram was performed and dictated separately. Site 2: Right breast 9 o'clock position: Coil clip Lesion quadrant: Lower outer quadrant Using sterile technique and 1% Lidocaine as local anesthetic, under direct ultrasound visualization, a 14 gauge spring-loaded device was used to perform biopsy of right breast 9 o'clock mass using a lateral approach. At the conclusion of the procedure coil shaped tissue marker clip was deployed into the biopsy cavity. Follow up 2 view mammogram was performed and dictated separately. Site 3: Low right axilla: Heart clip Lesion quadrant: Upper outer quadrant Using sterile technique and 1% Lidocaine as local anesthetic, under direct ultrasound visualization, a 14 gauge spring-loaded device was used to perform biopsy of low right axillary mass using a lateral approach. At the conclusion of the procedure heart shaped tissue marker clip was deployed into the biopsy cavity. Follow up 2 view mammogram was performed and dictated separately. IMPRESSION: Ultrasound guided biopsy of 2 right breast masses and low right axillary mass. No apparent complications. Electronically Signed: By: Lovey Newcomer M.D. 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 This was found to be concordant by Dr. Lovey Newcomer. Pathology  revealed FIBROCYSTIC CHANGE- NEGATIVE FOR CARCINOMA of the RIGHT breast, 9 o'clock (coil clip). This was found to be concordant by Dr. Lovey Newcomer. Pathology revealed METASTATIC CARCINOMA INVOLVING A LYMPH NODE of the RIGHT axilla, (heart clip). This was found to be concordant by Dr. Lovey Newcomer. Pathology results were discussed with the patient by telephone. The patient reported doing well after the biopsies with tenderness at the sites. Post biopsy instructions and care were reviewed and questions were answered. The patient was encouraged to call [facility] for any additional concerns. The patient was referred to [facility] at [facility] on November 20, 2020. Pathology results reported by Stacie Acres RN on 11/11/2020. Electronically Signed   By: Lovey Newcomer M.D.   On: 11/11/2020 14:28   Result Date: 11/12/2020 CLINICAL DATA:  Patient with indeterminate right breast masses at the 10 o'clock and 9 o'clock position and low right axilla. EXAM: ULTRASOUND GUIDED RIGHT BREAST CORE NEEDLE BIOPSY COMPARISON:  Previous exam(s). PROCEDURE: I met with the patient and we discussed the procedure of ultrasound-guided biopsy, including benefits and alternatives. We discussed the high likelihood of a successful procedure. We discussed the risks of the procedure, including infection, bleeding, tissue injury, clip migration, and inadequate sampling. Informed written consent was given. The usual time-out protocol was performed immediately prior to the procedure. Site 1: Right breast 10 o'clock position: Ribbon shaped clip Lesion quadrant: Upper outer quadrant Using sterile technique and 1% Lidocaine as local anesthetic, under direct ultrasound visualization, a 14 gauge spring-loaded device was used to perform biopsy of right breast mass 10 o'clock position using a lateral approach. At the conclusion of the procedure coil shaped tissue marker clip  was deployed into the biopsy cavity. Follow  up 2 view mammogram was performed and dictated separately. Site 2: Right breast 9 o'clock position: Coil clip Lesion quadrant: Lower outer quadrant Using sterile technique and 1% Lidocaine as local anesthetic, under direct ultrasound visualization, a 14 gauge spring-loaded device was used to perform biopsy of right breast 9 o'clock mass using a lateral approach. At the conclusion of the procedure coil shaped tissue marker clip was deployed into the biopsy cavity. Follow up 2 view mammogram was performed and dictated separately. Site 3: Low right axilla: Heart clip Lesion quadrant: Upper outer quadrant Using sterile technique and 1% Lidocaine as local anesthetic, under direct ultrasound visualization, a 14 gauge spring-loaded device was used to perform biopsy of low right axillary mass using a lateral approach. At the conclusion of the procedure heart shaped tissue marker clip was deployed into the biopsy cavity. Follow up 2 view mammogram was performed and dictated separately. IMPRESSION: Ultrasound guided biopsy of 2 right breast masses and low right axillary mass. No apparent complications. Electronically Signed: By: Lovey Newcomer M.D. On: 11/08/2020 10:28    ELIGIBLE FOR AVAILABLE RESEARCH PROTOCOL: no  ASSESSMENT: 55 y.o. [place] woman status post right breast upper outer quadrant biopsy 11/08/2020 for a clinical T1c N1, stage IIB invasive ductal carcinoma, grade 3, functionally triple negative, with an MIB-1 of 30%  (1) genetics test  (2) neoadjuvant chemotherapy to consist of carboplatin and paclitaxel with pembrolizumab every 21 days x 4 starting 12/03/2020, to be followed by doxorubicin and cyclophosphamide with pembrolizumab every 21 days x 4  (3) definitive surgery to follow  (4) adjuvant radiation   PLAN: Chelesa has done a very good job of getting all her tests scheduled on done.  We went over her echo which is very favorable and her MRI and  I showed her the images.  She took a Clinical research associate.  She understands why we need additional biopsies so we can make a definitive surgical plans at the end of chemotherapy.  She has a good understanding of the possible toxicities side effects and complications of chemo.  We also reviewed some of the possible side effects of steroids which she will be taking.  I went ahead and wrote her an Ambien for sleep if she needs it understanding that she should not take it more than twice a week at most.  We discussed the dignity.  She is interested in pursuing that possibility and we will be able to accommodate her 12/03/2020 if she gets to kappa sometime between now and then, which should not be an issue.  I am going to see her on day 8 with her treatment and I asked her to keep a symptom diary so we can discuss problems that may develop with the first treatment  Total encounter time 25 minutes.Sarajane Jews C. Christianne Zacher, MD 11/28/2020 4:40 PM Medical Oncology and Hematology [facility] [place], [place] [address] Tel. [phone]    Fax. [phone]   This document serves as a record of services personally performed by Lurline Del, MD. It was created on his behalf by Wilburn Mylar, a trained medical scribe. The creation of this record is based on the scribe's personal observations and the provider's statements to them.   I, Lurline Del MD, have reviewed the above documentation for accuracy and completeness, and I agree with the above.   *Total Encounter Time as defined by the Centers for Medicare and Medicaid Services includes, in addition to the face-to-face time of a patient  visit (documented in the note above) non-face-to-face time: obtaining and reviewing outside history, ordering and reviewing medications, tests or procedures, care coordination (communications with other health care professionals or caregivers) and documentation in the medical record.

## 2020-11-29 MED ORDER — OXYCODONE HCL 5 MG PO TABS
ORAL_TABLET | ORAL | Status: AC
  Filled 2020-11-29: qty 1

## 2020-11-29 MED ORDER — METHOCARBAMOL 500 MG PO TABS
ORAL_TABLET | ORAL | Status: AC
  Filled 2020-11-29: qty 1

## 2020-12-02 ENCOUNTER — Ambulatory Visit (HOSPITAL_BASED_OUTPATIENT_CLINIC_OR_DEPARTMENT_OTHER)
Admission: RE | Admit: 2020-12-02 | Discharge: 2020-12-02 | Disposition: A | Discharge status: Home / Self Care | Payer: Managed Care, Other (non HMO) | Attending: Surgery | Admitting: Surgery

## 2020-12-02 ENCOUNTER — Ambulatory Visit (HOSPITAL_COMMUNITY): Payer: Managed Care, Other (non HMO)

## 2020-12-02 ENCOUNTER — Ambulatory Visit (HOSPITAL_BASED_OUTPATIENT_CLINIC_OR_DEPARTMENT_OTHER): Payer: Managed Care, Other (non HMO) | Admitting: Certified Registered"

## 2020-12-02 ENCOUNTER — Other Ambulatory Visit: Payer: Self-pay

## 2020-12-02 ENCOUNTER — Encounter (HOSPITAL_BASED_OUTPATIENT_CLINIC_OR_DEPARTMENT_OTHER): Payer: Self-pay | Admitting: Surgery

## 2020-12-02 ENCOUNTER — Other Ambulatory Visit (HOSPITAL_COMMUNITY): Payer: Self-pay | Admitting: Surgery

## 2020-12-02 ENCOUNTER — Encounter (HOSPITAL_BASED_OUTPATIENT_CLINIC_OR_DEPARTMENT_OTHER)
Admission: RE | Disposition: A | Discharge status: Home / Self Care | Payer: Self-pay | Source: Home / Self Care | Attending: Surgery

## 2020-12-02 DIAGNOSIS — Z419 Encounter for procedure for purposes other than remedying health state, unspecified: Secondary | ICD-10-CM

## 2020-12-02 DIAGNOSIS — Z452 Encounter for adjustment and management of vascular access device: Secondary | ICD-10-CM

## 2020-12-02 DIAGNOSIS — Z803 Family history of malignant neoplasm of breast: Secondary | ICD-10-CM | POA: Insufficient documentation

## 2020-12-02 DIAGNOSIS — C50411 Malignant neoplasm of upper-outer quadrant of right female breast: Secondary | ICD-10-CM | POA: Insufficient documentation

## 2020-12-02 DIAGNOSIS — C773 Secondary and unspecified malignant neoplasm of axilla and upper limb lymph nodes: Secondary | ICD-10-CM | POA: Diagnosis not present

## 2020-12-02 DIAGNOSIS — Z7984 Long term (current) use of oral hypoglycemic drugs: Secondary | ICD-10-CM | POA: Diagnosis not present

## 2020-12-02 DIAGNOSIS — Z170 Estrogen receptor positive status [ER+]: Secondary | ICD-10-CM | POA: Insufficient documentation

## 2020-12-02 DIAGNOSIS — C4370 Malignant melanoma of unspecified lower limb, including hip: Secondary | ICD-10-CM

## 2020-12-02 DIAGNOSIS — Z85820 Personal history of malignant melanoma of skin: Secondary | ICD-10-CM | POA: Insufficient documentation

## 2020-12-02 DIAGNOSIS — Z79899 Other long term (current) drug therapy: Secondary | ICD-10-CM | POA: Insufficient documentation

## 2020-12-02 HISTORY — PX: PORTACATH PLACEMENT: SHX2246

## 2020-12-02 HISTORY — DX: Type 2 diabetes mellitus without complications: E11.9

## 2020-12-02 LAB — GLUCOSE, CAPILLARY
Glucose-Capillary: 96 mg/dL (ref 70–99)
Glucose-Capillary: 100 mg/dL — ABNORMAL HIGH (ref 70–99)

## 2020-12-02 SURGERY — INSERTION, TUNNELED CENTRAL VENOUS DEVICE, WITH PORT
Anesthesia: General | Site: Chest | Laterality: Right

## 2020-12-02 MED ORDER — FENTANYL CITRATE (PF) 100 MCG/2ML IJ SOLN
INTRAMUSCULAR | Status: AC
  Filled 2020-12-02: qty 2

## 2020-12-02 MED ORDER — ACETAMINOPHEN 500 MG PO TABS
1000.0000 mg | ORAL_TABLET | ORAL | Status: AC
  Administered 2020-12-02: 1000 mg via ORAL

## 2020-12-02 MED ORDER — DEXAMETHASONE SODIUM PHOSPHATE 4 MG/ML IJ SOLN
INTRAMUSCULAR | Status: DC | PRN
  Administered 2020-12-02: 4 mg via INTRAVENOUS

## 2020-12-02 MED ORDER — HEPARIN SOD (PORK) LOCK FLUSH 100 UNIT/ML IV SOLN
INTRAVENOUS | Status: AC
  Filled 2020-12-02: qty 5

## 2020-12-02 MED ORDER — HEPARIN (PORCINE) IN NACL 1000-0.9 UT/500ML-% IV SOLN
INTRAVENOUS | Status: AC
  Filled 2020-12-02: qty 500

## 2020-12-02 MED ORDER — MIDAZOLAM HCL 5 MG/5ML IJ SOLN
INTRAMUSCULAR | Status: DC | PRN
  Administered 2020-12-02: 2 mg via INTRAVENOUS

## 2020-12-02 MED ORDER — HYDROCODONE-ACETAMINOPHEN 5-325 MG PO TABS: 1.0000 | ORAL_TABLET | Freq: Four times a day (QID) | ORAL | 0 refills | Status: DC | PRN

## 2020-12-02 MED ORDER — PROPOFOL 10 MG/ML IV BOLUS
INTRAVENOUS | Status: DC | PRN
  Administered 2020-12-02: 180 mg via INTRAVENOUS

## 2020-12-02 MED ORDER — HEPARIN (PORCINE) IN NACL 2-0.9 UNITS/ML
INTRAMUSCULAR | Status: AC | PRN
  Administered 2020-12-02: 500 mL via INTRAVENOUS

## 2020-12-02 MED ORDER — CHLORHEXIDINE GLUCONATE CLOTH 2 % EX PADS: 6.0000 | MEDICATED_PAD | Freq: Once | CUTANEOUS | Status: DC

## 2020-12-02 MED ORDER — MIDAZOLAM HCL 2 MG/2ML IJ SOLN
INTRAMUSCULAR | Status: AC
  Filled 2020-12-02: qty 2

## 2020-12-02 MED ORDER — FENTANYL CITRATE (PF) 100 MCG/2ML IJ SOLN: 25.0000 ug | INTRAMUSCULAR | Status: DC | PRN

## 2020-12-02 MED ORDER — EPHEDRINE SULFATE 50 MG/ML IJ SOLN
INTRAMUSCULAR | Status: DC | PRN
  Administered 2020-12-02: 10 mg via INTRAVENOUS
  Administered 2020-12-02: 20 mg via INTRAVENOUS

## 2020-12-02 MED ORDER — CEFAZOLIN SODIUM-DEXTROSE 2-4 GM/100ML-% IV SOLN
2.0000 g | INTRAVENOUS | Status: AC
  Administered 2020-12-02: 2 g via INTRAVENOUS

## 2020-12-02 MED ORDER — BUPIVACAINE-EPINEPHRINE 0.25% -1:200000 IJ SOLN
INTRAMUSCULAR | Status: DC | PRN
  Administered 2020-12-02: 10 mL

## 2020-12-02 MED ORDER — SODIUM BICARBONATE 4.2 % IV SOLN
INTRAVENOUS | Status: AC
  Filled 2020-12-02: qty 10

## 2020-12-02 MED ORDER — EPHEDRINE 5 MG/ML INJ
INTRAVENOUS | Status: AC
  Filled 2020-12-02: qty 20

## 2020-12-02 MED ORDER — HEPARIN SOD (PORK) LOCK FLUSH 100 UNIT/ML IV SOLN
INTRAVENOUS | Status: DC | PRN
  Administered 2020-12-02: 500 [IU] via INTRAVENOUS

## 2020-12-02 MED ORDER — LACTATED RINGERS IV SOLN: INTRAVENOUS | Status: DC

## 2020-12-02 MED ORDER — LIDOCAINE HCL (CARDIAC) PF 100 MG/5ML IV SOSY
PREFILLED_SYRINGE | INTRAVENOUS | Status: DC | PRN
  Administered 2020-12-02: 60 mg via INTRAVENOUS

## 2020-12-02 MED ORDER — BUPIVACAINE-EPINEPHRINE (PF) 0.25% -1:200000 IJ SOLN
INTRAMUSCULAR | Status: AC
  Filled 2020-12-02: qty 30

## 2020-12-02 MED ORDER — ONDANSETRON HCL 4 MG/2ML IJ SOLN
INTRAMUSCULAR | Status: DC | PRN
  Administered 2020-12-02: 4 mg via INTRAVENOUS

## 2020-12-02 MED ORDER — FENTANYL CITRATE (PF) 100 MCG/2ML IJ SOLN
INTRAMUSCULAR | Status: DC | PRN
  Administered 2020-12-02: 100 ug via INTRAVENOUS

## 2020-12-02 MED ORDER — ACETAMINOPHEN 500 MG PO TABS
ORAL_TABLET | ORAL | Status: AC
  Filled 2020-12-02: qty 1

## 2020-12-02 MED ORDER — PHENYLEPHRINE 40 MCG/ML (10ML) SYRINGE FOR IV PUSH (FOR BLOOD PRESSURE SUPPORT)
PREFILLED_SYRINGE | INTRAVENOUS | Status: AC
  Filled 2020-12-02: qty 20

## 2020-12-02 MED ORDER — CEFAZOLIN SODIUM-DEXTROSE 2-4 GM/100ML-% IV SOLN
INTRAVENOUS | Status: AC
  Filled 2020-12-02: qty 100

## 2020-12-02 SURGICAL SUPPLY — 47 items
APL PRP STRL LF DISP 70% ISPRP (MISCELLANEOUS) ×1
APL SKNCLS STERI-STRIP NONHPOA (GAUZE/BANDAGES/DRESSINGS) ×1
BAG DECANTER FOR FLEXI CONT (MISCELLANEOUS) ×2 IMPLANT
BENZOIN TINCTURE PRP APPL 2/3 (GAUZE/BANDAGES/DRESSINGS) ×2 IMPLANT
BLADE SURG 11 STRL SS (BLADE) ×2 IMPLANT
BLADE SURG 15 STRL LF DISP TIS (BLADE) ×1 IMPLANT
BLADE SURG 15 STRL SS (BLADE) ×2
CANISTER SUCT 1200ML W/VALVE (MISCELLANEOUS) IMPLANT
CHLORAPREP W/TINT 26 (MISCELLANEOUS) ×2 IMPLANT
CLEANER CAUTERY TIP 5X5 PAD (MISCELLANEOUS) ×1 IMPLANT
COVER BACK TABLE 60X90IN (DRAPES) ×2 IMPLANT
COVER MAYO STAND STRL (DRAPES) ×2 IMPLANT
COVER PROBE 5X48 (MISCELLANEOUS) ×2
DECANTER SPIKE VIAL GLASS SM (MISCELLANEOUS) IMPLANT
DRAPE C-ARM 42X72 X-RAY (DRAPES) ×2 IMPLANT
DRAPE LAPAROTOMY TRNSV 102X78 (DRAPES) ×2 IMPLANT
DRAPE UTILITY XL STRL (DRAPES) ×2 IMPLANT
DRSG TEGADERM 4X4.75 (GAUZE/BANDAGES/DRESSINGS) ×2 IMPLANT
ELECT REM PT RETURN 9FT ADLT (ELECTROSURGICAL) ×2
ELECTRODE REM PT RTRN 9FT ADLT (ELECTROSURGICAL) ×1 IMPLANT
GAUZE SPONGE 4X4 12PLY STRL LF (GAUZE/BANDAGES/DRESSINGS) ×2 IMPLANT
GLOVE SURG ENC MOIS LTX SZ7 (GLOVE) ×4 IMPLANT
GLOVE SURG UNDER POLY LF SZ7.5 (GLOVE) ×4 IMPLANT
GOWN STRL REUS W/ TWL LRG LVL3 (GOWN DISPOSABLE) ×2 IMPLANT
GOWN STRL REUS W/TWL LRG LVL3 (GOWN DISPOSABLE) ×4
IV KIT MINILOC 20X1 SAFETY (NEEDLE) IMPLANT
KIT CVR 48X5XPRB PLUP LF (MISCELLANEOUS) ×1 IMPLANT
KIT PORT POWER 8FR ISP CVUE (Port) ×2 IMPLANT
NDL SAFETY ECLIPSE 18X1.5 (NEEDLE) IMPLANT
NEEDLE HYPO 18GX1.5 SHARP (NEEDLE)
NEEDLE HYPO 25X1 1.5 SAFETY (NEEDLE) ×2 IMPLANT
NEEDLE SPNL 22GX3.5 QUINCKE BK (NEEDLE) IMPLANT
PACK BASIN DAY SURGERY FS (CUSTOM PROCEDURE TRAY) ×2 IMPLANT
PAD CLEANER CAUTERY TIP 5X5 (MISCELLANEOUS) ×1
PENCIL SMOKE EVACUATOR (MISCELLANEOUS) ×2 IMPLANT
SLEEVE SCD COMPRESS KNEE MED (STOCKING) ×2 IMPLANT
SPONGE GAUZE 2X2 8PLY STRL LF (GAUZE/BANDAGES/DRESSINGS) ×2 IMPLANT
STRIP CLOSURE SKIN 1/2X4 (GAUZE/BANDAGES/DRESSINGS) ×2 IMPLANT
SUT MON AB 4-0 PC3 18 (SUTURE) ×2 IMPLANT
SUT PROLENE 2 0 CT2 30 (SUTURE) ×2 IMPLANT
SUT VIC AB 3-0 SH 27 (SUTURE) ×2
SUT VIC AB 3-0 SH 27X BRD (SUTURE) ×1 IMPLANT
SYR 5ML LUER SLIP (SYRINGE) ×2 IMPLANT
SYR CONTROL 10ML LL (SYRINGE) ×2 IMPLANT
TOWEL GREEN STERILE FF (TOWEL DISPOSABLE) ×2 IMPLANT
TUBE CONNECTING 20X1/4 (TUBING) IMPLANT
YANKAUER SUCT BULB TIP NO VENT (SUCTIONS) IMPLANT

## 2020-12-02 NOTE — Discharge Instructions (Addendum)
PORT-A-CATH: POST OP INSTRUCTIONS  Always review your discharge instruction sheet given to you by the facility where your surgery was performed.   A prescription for pain medication may be given to you upon discharge. Take your pain medication as prescribed, if needed. If narcotic pain medicine is not needed, then you make take acetaminophen (Tylenol) or ibuprofen (Advil) as needed.  Take your usually prescribed medications unless otherwise directed. If you need a refill on your pain medication, please contact our office. All narcotic pain medicine now requires a paper prescription.  Phoned in and fax refills are no longer allowed by law.  Prescriptions will not be filled after 5 pm or on weekends.  You should follow a light diet for the remainder of the day after your procedure. Most patients will experience some mild swelling and/or bruising in the area of the incision. It may take several days to resolve. It is common to experience some constipation if taking pain medication after surgery. Increasing fluid intake and taking a stool softener (such as Colace) will usually help or prevent this problem from occurring. A mild laxative (Milk of Magnesia or Miralax) should be taken according to package directions if there are no bowel movements after 48 hours.  If your port is left accessed at the end of surgery (needle left in port), the dressing cannot get wet and should only by changed by a healthcare professional. When the port is no longer accessed (when the needle has been removed), you may shower over the steri-strips in 48 hours. ACTIVITIES:  Limit activity involving your arms for the next 72 hours. Do no strenuous exercise or activity for 1 week. You may drive when you are no longer taking prescription pain medication, you can comfortably wear a seatbelt, and you can maneuver your car. 10.You may need to see your doctor in the office for a follow-up appointment.  Please       check with your  doctor.  11.When you receive a new Port-a-Cath, you will get a product guide and        ID card.  Please keep them in case you need them.  WHEN TO CALL YOUR DOCTOR [phone]): Fever over 101.0 Chills Continued bleeding from incision Increased redness and tenderness at the site Shortness of breath, difficulty breathing   The clinic staff is available to answer your questions during regular business hours. Please don't hesitate to call and ask to speak to one of the nurses or medical assistants for clinical concerns. If you have a medical emergency, go to the nearest emergency room or call 911.  A surgeon from [facility] is always on call at the hospital.     For further information, please visit [email or web address]     Post Anesthesia Home Care Instructions  Activity: Get plenty of rest for the remainder of the day. A responsible individual must stay with you for 24 hours following the procedure.  For the next 24 hours, DO NOT: -Drive a car -Paediatric nurse -Drink alcoholic beverages -Take any medication unless instructed by your physician -Make any legal decisions or sign important papers.  Meals: Start with liquid foods such as gelatin or soup. Progress to regular foods as tolerated. Avoid greasy, spicy, heavy foods. If nausea and/or vomiting occur, drink only clear liquids until the nausea and/or vomiting subsides. Call your physician if vomiting continues.  Special Instructions/Symptoms: Your throat may feel dry or sore from the anesthesia or the breathing tube placed in your  throat during surgery. If this causes discomfort, gargle with warm salt water. The discomfort should disappear within 24 hours.  If you had a scopolamine patch placed behind your ear for the management of post- operative nausea and/or vomiting:  1. The medication in the patch is effective for 72 hours, after which it should be removed.  Wrap patch in a tissue and discard  in the trash. Wash hands thoroughly with soap and water. 2. You may remove the patch earlier than 72 hours if you experience unpleasant side effects which may include dry mouth, dizziness or visual disturbances. 3. Avoid touching the patch. Wash your hands with soap and water after contact with the patch.     Can take Tylenol after 7pm as needed for pain

## 2020-12-02 NOTE — Anesthesia Postprocedure Evaluation (Signed)
Anesthesia Post Note  Patient: Robin Farley  Procedure(s) Performed: INSERTION PORT-A-CATH (Right: Chest)     Patient location during evaluation: PACU Anesthesia Type: General Level of consciousness: awake Pain management: pain level controlled Vital Signs Assessment: post-procedure vital signs reviewed and stable Respiratory status: spontaneous breathing Cardiovascular status: stable Postop Assessment: no apparent nausea or vomiting Anesthetic complications: no   No notable events documented.  Last Vitals:  Vitals:   12/02/20 1415 12/02/20 1437  BP: (!) 151/75 130/73  Pulse: 93 77  Resp: (!) 22 18  Temp:  (!) 36.2 C  SpO2: 97% 99%    Last Pain:  Vitals:   12/02/20 1437  TempSrc:   PainSc: 0-No pain                 Faige Seely

## 2020-12-02 NOTE — Anesthesia Preprocedure Evaluation (Addendum)
Anesthesia Evaluation  Patient identified by MRN, date of birth, ID band Patient awake    Reviewed: Allergy & Precautions, NPO status , Patient's Chart, lab work & pertinent test results  Airway Mallampati: II  TM Distance: >3 FB     Dental   Pulmonary neg pulmonary ROS,    breath sounds clear to auscultation       Cardiovascular  Rhythm:Regular Rate:Normal     Neuro/Psych  Headaches, Anxiety Depression    GI/Hepatic Neg liver ROS, GERD  ,  Endo/Other  diabetes  Renal/GU negative Renal ROS     Musculoskeletal   Abdominal   Peds  Hematology   Anesthesia Other Findings   Reproductive/Obstetrics                             Anesthesia Physical Anesthesia Plan  ASA: 3  Anesthesia Plan: General   Post-op Pain Management:    Induction: Intravenous  PONV Risk Score and Plan: 3 and Ondansetron, Dexamethasone and Midazolam  Airway Management Planned: LMA  Additional Equipment:   Intra-op Plan:   Post-operative Plan: Extubation in OR  Informed Consent: I have reviewed the patients History and Physical, chart, labs and discussed the procedure including the risks, benefits and alternatives for the proposed anesthesia with the patient or authorized representative who has indicated his/her understanding and acceptance.     Dental advisory given  Plan Discussed with: CRNA, Anesthesiologist and Surgeon  Anesthesia Plan Comments:        Anesthesia Quick Evaluation

## 2020-12-02 NOTE — Anesthesia Procedure Notes (Signed)
Procedure Name: LMA Insertion Date/Time: 12/02/2020 1:18 PM Performed by: Signe Colt, CRNA Pre-anesthesia Checklist: Patient identified, Emergency Drugs available, Suction available and Patient being monitored Patient Re-evaluated:Patient Re-evaluated prior to induction Oxygen Delivery Method: Circle System Utilized Preoxygenation: Pre-oxygenation with 100% oxygen Induction Type: IV induction Ventilation: Mask ventilation without difficulty LMA: LMA inserted LMA Size: 4.0 Number of attempts: 1 Airway Equipment and Method: bite block Placement Confirmation: positive ETCO2 Tube secured with: Tape Dental Injury: Teeth and Oropharynx as per pre-operative assessment

## 2020-12-02 NOTE — Op Note (Signed)
Preop diagnosis: invasive ductal carcinoma right breast Postop diagnosis: Same Procedure performed: Ultrasound guided right internal jugular vein port placement Surgeon:Marquarius Lofton K Samhita Kretsch Anesthesia: General via LMA Indications: This is a healthy 55 year old with a history of melanoma of the left leg status post wide excision and sentinel lymph node biopsy in 2014 by Dr. Adonis Housekeeper.  The patient has had not had mammograms in the last 3 years.  Recently she noticed some asymmetry and a possible mass in her right breast.  This was about 4 months ago.  She brought this to the attention of her gynecologist to obtain mammogram followed by ultrasound and biopsy.  She had 2 separate masses noted in the right breast.  At 10:00 located 7 cm from the nipple there was a 1.9 x 1.1 x 1.7 cm mass.  At 9:00 there was a smaller mass that was subsequently biopsied and was benign.  Axillary ultrasound showed 2 enlarged lymph nodes.  1 of these was biopsied.  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  The axillary lymph node showed metastatic carcinoma.  She presents now for port placement for neoadjuvant chemotherapy.  Description of procedure: The patient is brought to the operating room placed in the supine position on the operating table.  After an adequate level of general anesthesia was obtained, the patient right arm was tucked at her side.  Her right chest and neck were prepped with ChloraPrep and draped sterile fashion.  A timeout was taken to ensure the proper patient and proper procedure.  She was placed in Trendelenburg position.  We interrogated her neck with the ultrasound.  The jugular vein is easily identified.  Using ultrasound guidance we directly cannulated the internal jugular vein with good blood return.  The wire passed easily.  Fluoroscopy confirmed that the wire headed down the right side of the mediastinum.  The needle was removed.  We created a  subcutaneous pocket below the right clavicle.  We first anesthetized with local anesthetic.  We created a subcutaneous tunnel from the subcutaneous pocket to the insertion site on the neck.  An 8 French [place] port was assembled and was tunneled from the subcutaneous pocket to the insertion site.  The catheter was cut to the appropriate length using fluoroscopic guidance.  Using fluoroscopic guidance, we passed the dilator and breakaway sheath over the wire.  The wire and dilator were removed.  The catheter was then advanced through the sheath which was removed.  Fluoroscopy confirmed that there were no kinks along the length of the catheter.  We are able to aspirate blood easily through the port and were able to flush easily.  The port was secured with two interrupted 2-0 Prolene sutures.  3-0 Vicryl was used to close the subcutaneous tissue and 4-0 Monocryl was used to close the skin at both sites.  Benzoin and Steri-Strips were applied.  The port was accessed and instilled with concentrated heparin solution.  An occlusive dressing was placed.  The patient was then extubated and brought to the recovery room in stable condition.  All sponge, instrument, and needle counts are correct.  Post-op chest x-ray is pending.  Imogene Burn. Georgette Dover, MD, [facility]  General Surgery   12/02/2020 2:08 PM

## 2020-12-02 NOTE — Transfer of Care (Signed)
Immediate Anesthesia Transfer of Care Note  Patient: Robin Farley  Procedure(s) Performed: INSERTION PORT-A-CATH (Right: Chest)  Patient Location: PACU  Anesthesia Type:General  Level of Consciousness: drowsy and patient cooperative  Airway & Oxygen Therapy: Patient Spontanous Breathing and Patient connected to face mask oxygen  Post-op Assessment: Report given to RN and Post -op Vital signs reviewed and stable  Post vital signs: Reviewed and stable  Last Vitals:  Vitals Value Taken Time  BP    Temp    Pulse 96 12/02/20 1409  Resp    SpO2 100 % 12/02/20 1409  Vitals shown include unvalidated device data.  Last Pain:  Vitals:   12/02/20 1256  TempSrc: Oral  PainSc: 0-No pain         Complications: No notable events documented.

## 2020-12-02 NOTE — Interval H&P Note (Signed)
History and Physical Interval Note:  12/02/2020 12:41 PM  Robin Farley  has presented today for surgery, with the diagnosis of BREAST CANCER.  The various methods of treatment have been discussed with the patient and family. After consideration of risks, benefits and other options for treatment, the patient has consented to  Procedure(s): INSERTION PORT-A-CATH (N/A) as a surgical intervention.  The patient's history has been reviewed, patient examined, no change in status, stable for surgery.  I have reviewed the patient's chart and labs.  Questions were answered to the patient's satisfaction.     [name]

## 2020-12-03 ENCOUNTER — Inpatient Hospital Stay: Payer: Managed Care, Other (non HMO)

## 2020-12-03 ENCOUNTER — Encounter: Payer: Self-pay | Admitting: *Deleted

## 2020-12-03 ENCOUNTER — Encounter (HOSPITAL_BASED_OUTPATIENT_CLINIC_OR_DEPARTMENT_OTHER): Payer: Self-pay | Admitting: Surgery

## 2020-12-03 ENCOUNTER — Encounter: Payer: Self-pay | Admitting: Oncology

## 2020-12-03 VITALS — BP 133/75 | HR 69 | Temp 97.7°F | Resp 18 | Wt 155.0 lb

## 2020-12-03 DIAGNOSIS — Z95828 Presence of other vascular implants and grafts: Secondary | ICD-10-CM | POA: Insufficient documentation

## 2020-12-03 DIAGNOSIS — Z171 Estrogen receptor negative status [ER-]: Secondary | ICD-10-CM

## 2020-12-03 DIAGNOSIS — Z5112 Encounter for antineoplastic immunotherapy: Secondary | ICD-10-CM | POA: Diagnosis not present

## 2020-12-03 DIAGNOSIS — C4370 Malignant melanoma of unspecified lower limb, including hip: Secondary | ICD-10-CM

## 2020-12-03 HISTORY — DX: Presence of other vascular implants and grafts: Z95.828

## 2020-12-03 LAB — CMP (CANCER CENTER ONLY)
ALT: 22 U/L (ref 0–44)
AST: 17 U/L (ref 15–41)
Albumin: 3.9 g/dL (ref 3.5–5.0)
Alkaline Phosphatase: 70 U/L (ref 38–126)
Anion gap: 12 (ref 5–15)
BUN: 13 mg/dL (ref 6–20)
CO2: 23 mmol/L (ref 22–32)
Calcium: 9.4 mg/dL (ref 8.9–10.3)
Chloride: 107 mmol/L (ref 98–111)
Creatinine: 0.88 mg/dL (ref 0.44–1.00)
GFR, Estimated: >60 mL/min (ref >60)
Glucose, Bld: 127 mg/dL — ABNORMAL HIGH (ref 70–99)
Potassium: 3.5 mmol/L (ref 3.5–5.1)
Sodium: 142 mmol/L (ref 135–145)
Total Bilirubin: 0.3 mg/dL (ref 0.3–1.2)
Total Protein: 7.1 g/dL (ref 6.5–8.1)

## 2020-12-03 LAB — CBC WITH DIFFERENTIAL (CANCER CENTER ONLY)
Abs Immature Granulocytes: 0.02 10*3/uL (ref 0.00–0.07)
Basophils Absolute: 0 10*3/uL (ref 0.0–0.1)
Basophils Relative: 0 %
Eosinophils Absolute: 0 10*3/uL (ref 0.0–0.5)
Eosinophils Relative: 1 %
HCT: 36 % (ref 36.0–46.0)
Hemoglobin: 12.2 g/dL (ref 12.0–15.0)
Immature Granulocytes: 0 %
Lymphocytes Relative: 27 %
Lymphs Abs: 2.1 10*3/uL (ref 0.7–4.0)
MCH: 30.5 pg (ref 26.0–34.0)
MCHC: 33.9 g/dL (ref 30.0–36.0)
MCV: 90 fL (ref 80.0–100.0)
Monocytes Absolute: 0.4 10*3/uL (ref 0.1–1.0)
Monocytes Relative: 5 %
Neutro Abs: 5.3 10*3/uL (ref 1.7–7.7)
Neutrophils Relative %: 67 %
Platelet Count: 232 10*3/uL (ref 150–400)
RBC: 4 MIL/uL (ref 3.87–5.11)
RDW: 11.8 % (ref 11.5–15.5)
WBC Count: 7.9 10*3/uL (ref 4.0–10.5)
nRBC: 0 % (ref 0.0–0.2)

## 2020-12-03 MED ORDER — DIPHENHYDRAMINE HCL 50 MG/ML IJ SOLN
50.0000 mg | Freq: Once | INTRAMUSCULAR | Status: AC
  Administered 2020-12-03: 50 mg via INTRAVENOUS
  Filled 2020-12-03: qty 1

## 2020-12-03 MED ORDER — SODIUM CHLORIDE 0.9 % IV SOLN
Freq: Once | INTRAVENOUS | Status: AC
Start: 2020-12-03 — End: 2020-12-03

## 2020-12-03 MED ORDER — FAMOTIDINE 20 MG IN NS 100 ML IVPB
20.0000 mg | Freq: Once | INTRAVENOUS | Status: AC
  Administered 2020-12-03: 20 mg via INTRAVENOUS
  Filled 2020-12-03: qty 100

## 2020-12-03 MED ORDER — ACETAMINOPHEN 500 MG PO TABS
500.0000 mg | ORAL_TABLET | Freq: Once | ORAL | Status: AC
  Administered 2020-12-03: 500 mg via ORAL
  Filled 2020-12-03: qty 1

## 2020-12-03 MED ORDER — HEPARIN SOD (PORK) LOCK FLUSH 100 UNIT/ML IV SOLN
500.0000 [IU] | Freq: Once | INTRAVENOUS | Status: DC | PRN
Start: 2020-12-03 — End: 2020-12-03

## 2020-12-03 MED ORDER — SODIUM CHLORIDE 0.9% FLUSH: 10.0000 mL | INTRAVENOUS | Status: DC | PRN

## 2020-12-03 MED ORDER — SODIUM CHLORIDE 0.9 % IV SOLN
80.0000 mg/m2 | Freq: Once | INTRAVENOUS | Status: AC
  Administered 2020-12-03: 144 mg via INTRAVENOUS
  Filled 2020-12-03: qty 24

## 2020-12-03 MED ORDER — PALONOSETRON HCL INJECTION 0.25 MG/5ML
0.2500 mg | Freq: Once | INTRAVENOUS | Status: AC
  Administered 2020-12-03: 0.25 mg via INTRAVENOUS
  Filled 2020-12-03: qty 5

## 2020-12-03 MED ORDER — SODIUM CHLORIDE 0.9 % IV SOLN
150.0000 mg | Freq: Once | INTRAVENOUS | Status: AC
  Administered 2020-12-03: 150 mg via INTRAVENOUS
  Filled 2020-12-03: qty 150

## 2020-12-03 MED ORDER — SODIUM CHLORIDE 0.9 % IV SOLN
510.0000 mg | Freq: Once | INTRAVENOUS | Status: AC
  Administered 2020-12-03: 510 mg via INTRAVENOUS
  Filled 2020-12-03: qty 51

## 2020-12-03 MED ORDER — SODIUM CHLORIDE 0.9 % IV SOLN
10.0000 mg | Freq: Once | INTRAVENOUS | Status: AC
  Administered 2020-12-03: 10 mg via INTRAVENOUS
  Filled 2020-12-03: qty 10

## 2020-12-03 MED ORDER — SODIUM CHLORIDE 0.9% FLUSH
10.0000 mL | INTRAVENOUS | Status: DC | PRN
  Administered 2020-12-03: 10 mL

## 2020-12-03 MED ORDER — SODIUM CHLORIDE 0.9 % IV SOLN
200.0000 mg | Freq: Once | INTRAVENOUS | Status: AC
  Administered 2020-12-03: 200 mg via INTRAVENOUS
  Filled 2020-12-03: qty 8

## 2020-12-03 NOTE — Patient Instructions (Addendum)
[facility]  Discharge Instructions: Thank you for choosing [facility] to provide your oncology and hematology care.   If you have a lab appointment with the [facility], please go directly to the [facility] and check in at the registration area.   Wear comfortable clothing and clothing appropriate for easy access to any Portacath or PICC line.   We strive to give you quality time with your provider. You may need to reschedule your appointment if you arrive late (15 or more minutes).  Arriving late affects you and other patients whose appointments are after yours.  Also, if you miss three or more appointments without notifying the office, you may be dismissed from the clinic at the provider's discretion.      For prescription refill requests, have your pharmacy contact our office and allow 72 hours for refills to be completed.    Today you received the following chemotherapy and/or immunotherapy agents: Keytruda/Taxol/Carboplatin.      To help prevent nausea and vomiting after your treatment, we encourage you to take your nausea medication as directed.  BELOW ARE SYMPTOMS THAT SHOULD BE REPORTED IMMEDIATELY: *FEVER GREATER THAN 100.4 F (38 C) OR HIGHER *CHILLS OR SWEATING *NAUSEA AND VOMITING THAT IS NOT CONTROLLED WITH YOUR NAUSEA MEDICATION *UNUSUAL SHORTNESS OF BREATH *UNUSUAL BRUISING OR BLEEDING *URINARY PROBLEMS (pain or burning when urinating, or frequent urination) *BOWEL PROBLEMS (unusual diarrhea, constipation, pain near the anus) TENDERNESS IN MOUTH AND THROAT WITH OR WITHOUT PRESENCE OF ULCERS (sore throat, sores in mouth, or a toothache) UNUSUAL RASH, SWELLING OR PAIN  UNUSUAL VAGINAL DISCHARGE OR ITCHING   Items with * indicate a potential emergency and should be followed up as soon as possible or go to the Emergency Department if any problems should occur.  Please show the CHEMOTHERAPY ALERT CARD or IMMUNOTHERAPY ALERT  CARD at check-in to the Emergency Department and triage nurse.  Should you have questions after your visit or need to cancel or reschedule your appointment, please contact [facility]  Dept: [phone]  and follow the prompts.  Office hours are 8:00 a.m. to 4:30 p.m. Monday - Friday. Please note that voicemails left after 4:00 p.m. may not be returned until the following business day.  We are closed weekends and major holidays. You have access to a nurse at all times for urgent questions. Please call the main number to the clinic Dept: [phone] and follow the prompts.   For any non-urgent questions, you may also contact your provider using MyChart. We now offer e-Visits for anyone 76 and older to request care online for non-urgent symptoms. For details visit [email or web address].   Also download the MyChart app! Go to the app store, search "MyChart", open the app, select [facility], and log in with your MyChart username and password.  Due to Covid, a mask is required upon entering the hospital/clinic. If you do not have a mask, one will be given to you upon arrival. For doctor visits, patients may have 1 support person aged 25 or older with them. For treatment visits, patients cannot have anyone with them due to current Covid guidelines and our immunocompromised population.   Pembrolizumab injection What is this medication? PEMBROLIZUMAB (pem broe liz ue mab) is a monoclonal antibody. It is used totreat certain types of cancer. This medicine may be used for other purposes; ask your health care provider orpharmacist if you have questions. COMMON BRAND NAME(S): Keytruda What should I  tell my care team before I take this medication? They need to know if you have any of these conditions: autoimmune diseases like Crohn's disease, ulcerative colitis, or lupus have had or planning to have an allogeneic stem cell transplant (uses someone else's stem cells) history  of organ transplant history of chest radiation nervous system problems like myasthenia gravis or Guillain-Barre syndrome an unusual or allergic reaction to pembrolizumab, other medicines, foods, dyes, or preservatives pregnant or trying to get pregnant breast-feeding How should I use this medication? This medicine is for infusion into a vein. It is given by a health careprofessional in a hospital or clinic setting. A special MedGuide will be given to you before each treatment. Be sure to readthis information carefully each time. Talk to your pediatrician regarding the use of this medicine in children. While this drug may be prescribed for children as young as 6 months for selectedconditions, precautions do apply. Overdosage: If you think you have taken too much of this medicine contact apoison control center or emergency room at once. NOTE: This medicine is only for you. Do not share this medicine with others. What if I miss a dose? It is important not to miss your dose. Call your doctor or health careprofessional if you are unable to keep an appointment. What may interact with this medication? Interactions have not been studied. This list may not describe all possible interactions. Give your health care provider a list of all the medicines, herbs, non-prescription drugs, or dietary supplements you use. Also tell them if you smoke, drink alcohol, or use illegaldrugs. Some items may interact with your medicine. What should I watch for while using this medication? Your condition will be monitored carefully while you are receiving thismedicine. You may need blood work done while you are taking this medicine. Do not become pregnant while taking this medicine or for 4 months after stopping it. Women should inform their doctor if they wish to become pregnant or think they might be pregnant. There is a potential for serious side effects to an unborn child. Talk to your health care professional or  pharmacist for more information. Do not breast-feed an infant while taking this medicine orfor 4 months after the last dose. What side effects may I notice from receiving this medication? Side effects that you should report to your doctor or health care professionalas soon as possible: allergic reactions like skin rash, itching or hives, swelling of the face, lips, or tongue bloody or black, tarry breathing problems changes in vision chest pain chills confusion constipation cough diarrhea dizziness or feeling faint or lightheaded fast or irregular heartbeat fever flushing joint pain low blood counts - this medicine may decrease the number of white blood cells, red blood cells and platelets. You may be at increased risk for infections and bleeding. muscle pain muscle weakness pain, tingling, numbness in the hands or feet persistent headache redness, blistering, peeling or loosening of the skin, including inside the mouth signs and symptoms of high blood sugar such as dizziness; dry mouth; dry skin; fruity breath; nausea; stomach pain; increased hunger or thirst; increased urination signs and symptoms of kidney injury like trouble passing urine or change in the amount of urine signs and symptoms of liver injury like dark urine, light-colored stools, loss of appetite, nausea, right upper belly pain, yellowing of the eyes or skin sweating swollen lymph nodes weight loss Side effects that usually do not require medical attention (report to yourdoctor or health care professional if they continue  or are bothersome): decreased appetite hair loss tiredness This list may not describe all possible side effects. Call your doctor for medical advice about side effects. You may report side effects to FDA at1-800-FDA-1088. Where should I keep my medication? This drug is given in a hospital or clinic and will not be stored at home. NOTE: This sheet is a summary. It may not cover all possible  information. If you have questions about this medicine, talk to your doctor, pharmacist, orhealth care provider.  2022 Elsevier/Gold Standard (2019-03-01 21:44:53)  Paclitaxel injection What is this medication? PACLITAXEL (PAK li TAX el) is a chemotherapy drug. It targets fast dividing cells, like cancer cells, and causes these cells to die. This medicine is used to treat ovarian cancer, breast cancer, lung cancer, Kaposi's sarcoma, andother cancers. This medicine may be used for other purposes; ask your health care provider orpharmacist if you have questions. COMMON BRAND NAME(S): Onxol, Taxol What should I tell my care team before I take this medication? They need to know if you have any of these conditions: history of irregular heartbeat liver disease low blood counts, like low white cell, platelet, or red cell counts lung or breathing disease, like asthma tingling of the fingers or toes, or other nerve disorder an unusual or allergic reaction to paclitaxel, alcohol, polyoxyethylated castor oil, other chemotherapy, other medicines, foods, dyes, or preservatives pregnant or trying to get pregnant breast-feeding How should I use this medication? This drug is given as an infusion into a vein. It is administered in a hospitalor clinic by a specially trained health care professional. Talk to your pediatrician regarding the use of this medicine in children.Special care may be needed. Overdosage: If you think you have taken too much of this medicine contact apoison control center or emergency room at once. NOTE: This medicine is only for you. Do not share this medicine with others. What if I miss a dose? It is important not to miss your dose. Call your doctor or health careprofessional if you are unable to keep an appointment. What may interact with this medication? Do not take this medicine with any of the following medications: live virus vaccines This medicine may also interact with the  following medications: antiviral medicines for hepatitis, HIV or AIDS certain antibiotics like erythromycin and clarithromycin certain medicines for fungal infections like ketoconazole and itraconazole certain medicines for seizures like carbamazepine, phenobarbital, phenytoin gemfibrozil nefazodone rifampin St. John's wort This list may not describe all possible interactions. Give your health care provider a list of all the medicines, herbs, non-prescription drugs, or dietary supplements you use. Also tell them if you smoke, drink alcohol, or use illegaldrugs. Some items may interact with your medicine. What should I watch for while using this medication? Your condition will be monitored carefully while you are receiving this medicine. You will need important blood work done while you are taking thismedicine. This medicine can cause serious allergic reactions. To reduce your risk you will need to take other medicine(s) before treatment with this medicine. If you experience allergic reactions like skin rash, itching or hives, swelling of theface, lips, or tongue, tell your doctor or health care professional right away. In some cases, you may be given additional medicines to help with side effects.Follow all directions for their use. This drug may make you feel generally unwell. This is not uncommon, as chemotherapy can affect healthy cells as well as cancer cells. Report any side effects. Continue your course of treatment even though you feel  ill unless yourdoctor tells you to stop. Call your doctor or health care professional for advice if you get a fever, chills or sore throat, or other symptoms of a cold or flu. Do not treat yourself. This drug decreases your body's ability to fight infections. Try toavoid being around people who are sick. This medicine may increase your risk to bruise or bleed. Call your doctor orhealth care professional if you notice any unusual bleeding. Be careful brushing  and flossing your teeth or using a toothpick because you may get an infection or bleed more easily. If you have any dental work done,tell your dentist you are receiving this medicine. Avoid taking products that contain aspirin, acetaminophen, ibuprofen, naproxen, or ketoprofen unless instructed by your doctor. These medicines may hide afever. Do not become pregnant while taking this medicine. Women should inform their doctor if they wish to become pregnant or think they might be pregnant. There is a potential for serious side effects to an unborn child. Talk to your health care professional or pharmacist for more information. Do not breast-feed aninfant while taking this medicine. Men are advised not to father a child while receiving this medicine. This product may contain alcohol. Ask your pharmacist or healthcare provider if this medicine contains alcohol. Be sure to tell all healthcare providers you are taking this medicine. Certain medicines, like metronidazole and disulfiram, can cause an unpleasant reaction when taken with alcohol. The reaction includes flushing, headache, nausea, vomiting, sweating, and increased thirst. Thereaction can last from 30 minutes to several hours. What side effects may I notice from receiving this medication? Side effects that you should report to your doctor or health care professionalas soon as possible: allergic reactions like skin rash, itching or hives, swelling of the face, lips, or tongue breathing problems changes in vision fast, irregular heartbeat high or low blood pressure mouth sores pain, tingling, numbness in the hands or feet signs of decreased platelets or bleeding - bruising, pinpoint red spots on the skin, black, tarry stools, blood in the urine signs of decreased red blood cells - unusually weak or tired, feeling faint or lightheaded, falls signs of infection - fever or chills, cough, sore throat, pain or difficulty passing urine signs and  symptoms of liver injury like dark yellow or brown urine; general ill feeling or flu-like symptoms; light-colored stools; loss of appetite; nausea; right upper belly pain; unusually weak or tired; yellowing of the eyes or skin swelling of the ankles, feet, hands unusually slow heartbeat Side effects that usually do not require medical attention (report to yourdoctor or health care professional if they continue or are bothersome): diarrhea hair loss loss of appetite muscle or joint pain nausea, vomiting pain, redness, or irritation at site where injected tiredness This list may not describe all possible side effects. Call your doctor for medical advice about side effects. You may report side effects to FDA at1-800-FDA-1088. Where should I keep my medication? This drug is given in a hospital or clinic and will not be stored at home. NOTE: This sheet is a summary. It may not cover all possible information. If you have questions about this medicine, talk to your doctor, pharmacist, orhealth care provider.  2022 Elsevier/Gold Standard (2019-03-01 13:37:23)  Carboplatin injection What is this medication? CARBOPLATIN (KAR boe pla tin) is a chemotherapy drug. It targets fast dividing cells, like cancer cells, and causes these cells to die. This medicine is usedto treat ovarian cancer and many other cancers. This medicine may be used for  other purposes; ask your health care provider orpharmacist if you have questions. COMMON BRAND NAME(S): Paraplatin What should I tell my care team before I take this medication? They need to know if you have any of these conditions: blood disorders hearing problems kidney disease recent or ongoing radiation therapy an unusual or allergic reaction to carboplatin, cisplatin, other chemotherapy, other medicines, foods, dyes, or preservatives pregnant or trying to get pregnant breast-feeding How should I use this medication? This drug is usually given as an  infusion into a vein. It is administered in [facility] or clinic by a specially trained health care professional. Talk to your pediatrician regarding the use of this medicine in children.Special care may be needed. Overdosage: If you think you have taken too much of this medicine contact apoison control center or emergency room at once. NOTE: This medicine is only for you. Do not share this medicine with others. What if I miss a dose? It is important not to miss a dose. Call your doctor or health careprofessional if you are unable to keep an appointment. What may interact with this medication? medicines for seizures medicines to increase blood counts like filgrastim, pegfilgrastim, sargramostim some antibiotics like amikacin, gentamicin, neomycin, streptomycin, tobramycin vaccines Talk to your doctor or health care professional before taking any of thesemedicines: acetaminophen aspirin ibuprofen ketoprofen naproxen This list may not describe all possible interactions. Give your health care provider a list of all the medicines, herbs, non-prescription drugs, or dietary supplements you use. Also tell them if you smoke, drink alcohol, or use illegaldrugs. Some items may interact with your medicine. What should I watch for while using this medication? Your condition will be monitored carefully while you are receiving this medicine. You will need important blood work done while you are taking thismedicine. This drug may make you feel generally unwell. This is not uncommon, as chemotherapy can affect healthy cells as well as cancer cells. Report any side effects. Continue your course of treatment even though you feel ill unless yourdoctor tells you to stop. In some cases, you may be given additional medicines to help with side effects.Follow all directions for their use. Call your doctor or health care professional for advice if you get a fever, chills or sore throat, or other symptoms of a cold or  flu. Do not treat yourself. This drug decreases your body's ability to fight infections. Try toavoid being around people who are sick. This medicine may increase your risk to bruise or bleed. Call your doctor orhealth care professional if you notice any unusual bleeding. Be careful brushing and flossing your teeth or using a toothpick because you may get an infection or bleed more easily. If you have any dental work done,tell your dentist you are receiving this medicine. Avoid taking products that contain aspirin, acetaminophen, ibuprofen, naproxen, or ketoprofen unless instructed by your doctor. These medicines may hide afever. Do not become pregnant while taking this medicine. Women should inform their doctor if they wish to become pregnant or think they might be pregnant. There is a potential for serious side effects to an unborn child. Talk to your health care professional or pharmacist for more information. Do not breast-feed aninfant while taking this medicine. What side effects may I notice from receiving this medication? Side effects that you should report to your doctor or health care professionalas soon as possible: allergic reactions like skin rash, itching or hives, swelling of the face, lips, or tongue signs of infection - fever or chills, cough,  sore throat, pain or difficulty passing urine signs of decreased platelets or bleeding - bruising, pinpoint red spots on the skin, black, tarry stools, nosebleeds signs of decreased red blood cells - unusually weak or tired, fainting spells, lightheadedness breathing problems changes in hearing changes in vision chest pain high blood pressure low blood counts - This drug may decrease the number of white blood cells, red blood cells and platelets. You may be at increased risk for infections and bleeding. nausea and vomiting pain, swelling, redness or irritation at the injection site pain, tingling, numbness in the hands or feet problems with  balance, talking, walking trouble passing urine or change in the amount of urine Side effects that usually do not require medical attention (report to yourdoctor or health care professional if they continue or are bothersome): hair loss loss of appetite metallic taste in the mouth or changes in taste This list may not describe all possible side effects. Call your doctor for medical advice about side effects. You may report side effects to FDA at1-800-FDA-1088. Where should I keep my medication? This drug is given in a hospital or clinic and will not be stored at home. NOTE: This sheet is a summary. It may not cover all possible information. If you have questions about this medicine, talk to your doctor, pharmacist, orhealth care provider.  2022 Elsevier/Gold Standard (2007-07-05 14:38:05)

## 2020-12-03 NOTE — Progress Notes (Signed)
Met with patient at registration to introduce myself as Arboriculturist and to offer available resources.  Discussed one-time $1000 Radio broadcast assistant to assist with personal expenses while going through treatment. Also discussed available copay assistance for [facility] if she has not met her ded/OOP. She thinks she has but not sure. Advised if she finds out from insurance that she has not, to let me know and I would apply on her behalf.  She has my card if interested in applying and for any additional financial questions or concerns.

## 2020-12-04 ENCOUNTER — Telehealth: Payer: Self-pay | Admitting: *Deleted

## 2020-12-04 NOTE — Telephone Encounter (Signed)
-----   Message from Wylene Men, RN sent at 12/03/2020  5:03 PM EDT ----- Regarding: MAGRINAT 1ST TIME KEYTRUDA/TAXOL/CARBO Patient received 1st time keytruda/taxol/carbo.  Tolerated well.  No s/s or c/o distress or discomfort.

## 2020-12-04 NOTE — Telephone Encounter (Signed)
Called pt to see how she did with her treatment yesterday.  She reports doing well.  She ate a breakfast burrito this am with some hot sauce & has felt a little nauseas since & took her nausea med this am & it has helped.  Discussed avoiding greasy, spicy, & heavy foods.  She is taking her steroid as prescribed & encouraged to take nausea med as needed.  She denied any questions & knows her next appt.

## 2020-12-06 ENCOUNTER — Ambulatory Visit: Payer: Self-pay | Admitting: Genetic Counselor

## 2020-12-06 ENCOUNTER — Encounter: Payer: Self-pay | Admitting: Genetic Counselor

## 2020-12-06 ENCOUNTER — Telehealth: Payer: Self-pay | Admitting: Genetic Counselor

## 2020-12-06 DIAGNOSIS — Z1379 Encounter for other screening for genetic and chromosomal anomalies: Secondary | ICD-10-CM

## 2020-12-06 DIAGNOSIS — Z808 Family history of malignant neoplasm of other organs or systems: Secondary | ICD-10-CM

## 2020-12-06 DIAGNOSIS — Z803 Family history of malignant neoplasm of breast: Secondary | ICD-10-CM

## 2020-12-06 DIAGNOSIS — C50411 Malignant neoplasm of upper-outer quadrant of right female breast: Secondary | ICD-10-CM

## 2020-12-06 DIAGNOSIS — Z85820 Personal history of malignant melanoma of skin: Secondary | ICD-10-CM

## 2020-12-06 NOTE — Progress Notes (Signed)
HPI:  Robin Farley was previously seen in the [place] clinic due to a personal and family history of cancer and concerns regarding a hereditary predisposition to cancer. Please refer to our prior cancer genetics clinic note for more information regarding our discussion, assessment and recommendations, at the time. Robin Farley recent genetic test results were disclosed to her, as were recommendations warranted by these results. These results and recommendations are discussed in more detail below.  CANCER HISTORY:  Oncology History  Malignant neoplasm of upper-outer quadrant of right breast in female, estrogen receptor negative ([place])  11/12/2020 Initial Diagnosis   Malignant neoplasm of upper-outer quadrant of right breast in female, estrogen receptor positive ([place])   11/20/2020 Cancer Staging   Staging form: Breast, AJCC 8th Edition - Clinical: Stage IIB (cT1c, cN1, cM0, G3, ER-, PR-, HER2-) - Signed by [name], MD on 11/20/2020 Histologic grading system: 3 grade system   12/03/2020 -  Chemotherapy    Patient is on Treatment Plan: BREAST PEMBROLIZUMAB + CARBOPLATIN D1 + PACLITAXEL D1,8,15 Q21D X 4 CYCLES / PEMBROLIZUMAB + AC Q21D X 4 CYCLES       12/03/2020 Genetic Testing   Negative hereditary cancer genetic testing: no pathogenic variants detected in [facility] CancerNext-Expanded +RNAinsight Panel.  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  The report date is December 03, 2020.   The CancerNext-Expanded gene panel offered by [facility] and includes sequencing, rearrangement, and RNA analysis for the following 77 genes: AIP, ALK, APC, ATM, AXIN2, BAP1, BARD1, BLM, BMPR1A, BRCA1, BRCA2, BRIP1, CDC73, CDH1, CDK4, CDKN1B, CDKN2A, CHEK2, CTNNA1, DICER1, FANCC, FH, FLCN, GALNT12, KIF1B, LZTR1, MAX, MEN1, MET, MLH1, MSH2, MSH3, MSH6, MUTYH, NBN, NF1, NF2, NTHL1, PALB2, PHOX2B, PMS2, POT1, PRKAR1A, PTCH1, PTEN, RAD51C, RAD51D, RB1, RECQL, RET,  SDHA, SDHAF2, SDHB, SDHC, SDHD, SMAD4, SMARCA4, SMARCB1, SMARCE1, STK11, SUFU, TMEM127, TP53, TSC1, TSC2, VHL and XRCC2 (sequencing and deletion/duplication); EGFR, EGLN1, HOXB13, KIT, MITF, PDGFRA, POLD1, and POLE (sequencing only); EPCAM and GREM1 (deletion/duplication only).      FAMILY HISTORY:  We obtained a detailed, 4-generation family history.  Significant diagnoses are listed below: Family History  Problem Relation Age of Onset   Breast cancer Mother 6   Kidney cancer Maternal Aunt        dx 33s   Leukemia Paternal Aunt        dx after 48   Cancer Paternal Aunt        unknown type; dx after 59   Brain cancer Paternal Grandfather 37       brain tumor per pt   Melanoma Cousin        arm; no mets per pt   Breast cancer Other        MGM's sisters, x3, dx after 107   Robin Farley is unaware of previous family history of genetic testing for hereditary cancer risks. There is no reported Ashkenazi Jewish ancestry. There is no known consanguinity.    GENETIC TEST RESULTS: Genetic testing reported out on December 03, 2020.  The CancerNext-Expanded +RNAinsight Panel found no pathogenic mutations. The CancerNext-Expanded gene panel offered by [facility] and includes sequencing, rearrangement, and RNA analysis for the following 77 genes: AIP, ALK, APC, ATM, AXIN2, BAP1, BARD1, BLM, BMPR1A, BRCA1, BRCA2, BRIP1, CDC73, CDH1, CDK4, CDKN1B, CDKN2A, CHEK2, CTNNA1, DICER1, FANCC, FH, FLCN, GALNT12, KIF1B, LZTR1, MAX, MEN1, MET, MLH1, MSH2, MSH3, MSH6, MUTYH, NBN, NF1, NF2, NTHL1, PALB2, PHOX2B, PMS2, POT1, PRKAR1A, PTCH1, PTEN, RAD51C, RAD51D,  RB1, RECQL, RET, SDHA, SDHAF2, SDHB, SDHC, SDHD, SMAD4, SMARCA4, SMARCB1, SMARCE1, STK11, SUFU, TMEM127, TP53, TSC1, TSC2, VHL and XRCC2 (sequencing and deletion/duplication); EGFR, EGLN1, HOXB13, KIT, MITF, PDGFRA, POLD1, and POLE (sequencing only); EPCAM and GREM1 (deletion/duplication only).   The test report has been scanned into EPIC and is located under  the Molecular Pathology section of the Results Review tab.  A portion of the result report is included below for reference.     We discussed with Robin Farley that because current genetic testing is not perfect, it is possible there may be a gene mutation in one of these genes that current testing cannot detect, but that chance is small.  We also discussed, that there could be another gene that has not yet been discovered, or that we have not yet tested, that is responsible for the cancer diagnoses in the family. It is also possible there is a hereditary cause for the cancer in the family that Robin Farley did not inherit and therefore was not identified in her testing.  Therefore, it is important to remain in touch with cancer genetics in the future so that we can continue to offer Robin Farley the most up to date genetic testing.   Genetic testing did identify a variant of uncertain significance (VUS) the APC gene called c.3227C>G (p.P1076R).  At this time, it is unknown if this variant is associated with increased cancer risk or if this is a normal finding, but most variants such as this get reclassified to being inconsequential. It should not be used to make medical management decisions. With time, we suspect the lab will determine the significance of this variant, if any. If we do learn more about it, we will try to contact Robin Farley to discuss it further. However, it is important to stay in touch with [place] periodically and keep the address and phone number up to date.  ADDITIONAL GENETIC TESTING:  We discussed with Robin Farley that her genetic testing was fairly extensive.  If there are genes identified to increase cancer risk that can be analyzed in the future, we would be happy to discuss and coordinate this testing at that time.    CANCER SCREENING RECOMMENDATIONS: Robin Farley test result is considered negative (normal).  This means that we have not identified a hereditary cause for her personal history of  cancer at this time. Most cancers happen by chance and this negative test suggests that her cancer may fall into this category.    While reassuring, this does not definitively rule out a hereditary predisposition to cancer. It is still possible that there could be genetic mutations that are undetectable by current technology. There could be genetic mutations in genes that have not been tested or identified to increase cancer risk.  Therefore, it is recommended she continue to follow the cancer management and screening guidelines provided by her oncology and primary healthcare provider.   An individual's cancer risk and medical management are not determined by genetic test results alone. Overall cancer risk assessment incorporates additional factors, including personal medical history, family history, and any available genetic information that may result in a personalized plan for cancer prevention and surveillance  RECOMMENDATIONS FOR FAMILY MEMBERS:  Individuals in this family might be at some increased risk of developing cancer, over the general population risk, simply due to the family history of cancer.  We recommended women in this family have a yearly mammogram beginning at age 71, or 16 years younger than the earliest  onset of cancer, an annual clinical breast exam, and perform monthly breast self-exams. Women in this family should also have a gynecological exam as recommended by their primary provider. Family members should be referred for colonoscopy starting at age 105, or earlier, as recommended by their providers.    FOLLOW-UP: Lastly, we discussed with Robin Farley that cancer genetics is a rapidly advancing field and it is possible that new genetic tests will be appropriate for her and/or her family members in the future. We encouraged her to remain in contact with cancer genetics on an annual basis so we can update her personal and family histories and let her know of advances in cancer genetics  that may benefit this family.   Our contact number was provided. Robin Farley questions were answered to her satisfaction, and she knows she is welcome to call us at anytime with additional questions or concerns.     Iwao Shamblin M. Joette Catching, [place], [facility] Genetic Counselor [email or web address] (P) [phone]

## 2020-12-06 NOTE — Telephone Encounter (Signed)
Revealed negative genetic testing and variant of uncertain significance in APC.  Discussed that we do not know why she has breast cancer or why there is cancer in the family. It could be familial, due to a different gene that we are not testing, or maybe our current technology may not be able to pick something up.  It will be important for her to keep in contact with genetics to keep up with whether additional testing may be needed.

## 2020-12-08 NOTE — Progress Notes (Signed)
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  First dose on 12/03/2020  INTERVAL HISTORY: Robin Farley 55 y.o. female returns to clinic today for a follow-up visit accompanied by her husband.  The patient is feeling fairly well today without any concerning complaints.The patient underwent a breast biopsy yesterday and tolerated it fairly well. She took two tablets of (presumably a total of 650 mg) of tylenol BID yesterday for pain. She denies abdominal pain. The patient underwent her first cycle of chemotherapy/immunotherapy last week and tolerated it well without any concerning adverse side effects. She had to take her anti-emetic once and her Ambien once as well.  She denies any recent fever, chills, night sweats, or unexplained weight loss.  She denies any skin infections.  She denies any rashes or skin changes.  She denies any vomiting or diarrhea. She had mild constipation and took miralax which was effective for her. Her last bowel movement was yesterday. She denies any peripheral neuropathy. She is planning on using the dignitiy cap today. She ordered one in the mail but has not received it yet. She brought one today that she used last time. She denies any chest pain, cough, or shortness of breath.  She is here today for evaluation and repeat blood work before starting day 8.  From the original intake note:   Robin Farley presented with a new palpable lump in the right breast. She underwent bilateral diagnostic mammography with tomography and right breast ultrasonography at [facility] on 11/06/2020 showing: breast density category B; 1.9 cm mass at palpable site of concern in right  breast at 10 o'clock; 0.8 cm smaller mass in right breast at 9 o'clock; 0.7 cm small mass in right axilla, possibly a replaced lymph node; second mildly suspicious lymph node in axilla.   Accordingly on 11/08/2020 she proceeded to biopsy of the right breast area in question. The pathology from this procedure ([id number]) showed:  1. Right Breast, 10 o'clock - invasive ductal carcinoma, grade 3  - Prognostic indicators significant for: estrogen receptor, 5% "positive "with weak staining intensity (this is functionally negative) and progesterone receptor, 0% negative. Proliferation marker Ki67 at 30%. HER2 equivocal by immunohistochemistry (2+), but negative by fluorescent in situ hybridization with a signals ratio 1.19 and number per cell 1.6. 2. Right Breast, 9 o'clock             - fibrocystic change 3. Right Axilla, lymph node             - metastatic carcinoma involving a lymph node   Cancer Staging Malignant neoplasm of upper-outer quadrant of right breast in female, estrogen receptor negative ([place]) Staging form: Breast, AJCC 8th Edition - Clinical: Stage IIB (cT1c, cN1, cM0, G3, ER-, PR-, HER2-) - Signed by [name], MD on 11/20/2020 Histologic grading system: 3 grade system   The patient's subsequent history is as detailed below.  MEDICAL HISTORY: Past Medical History:  Diagnosis Date   Abdominal pain, generalized 12/27/2008   Acute sinusitis, unspecified 02/26/2007   ADD 03/13/2010   ALLERGIC RHINITIS 02/23/2007   ANEMIA 02/05/2007   Anxiety 11/17/2011   ATTENTION DEFICIT DISORDER, HX OF 02/05/2007  Breast cancer ([place])    Deviated nasal septum 02/05/2007   Diabetes mellitus without complication ([place])    Family history of breast cancer 11/20/2020   Family history of melanoma 11/20/2020   FATIGUE 02/23/2007   HYPERLIPIDEMIA 02/23/2007   Personal history of malignant melanoma 11/20/2020   SWELLING MASS OR LUMP IN HEAD AND NECK 07/27/2008   UTI 02/23/2007     ALLERGIES:  is allergic to promethazine hcl.  MEDICATIONS:  Current Outpatient Medications  Medication Sig Dispense Refill   atorvastatin (LIPITOR) 20 MG tablet Take 1 tablet (20 mg total) by mouth daily. 90 tablet 3   Cholecalciferol (THERA-D 2000) 50 MCG (2000 UT) TABS 1 tab by mouth once daily 30 tablet 99   dexamethasone (DECADRON) 4 MG tablet Take 2 tablets once a day for 3 days after carboplatin and AC chemotherapy. Take with food. 30 tablet 1   Emollient (ROC RETINOL CORREXION EYE) CREA Use as directed daily 15 mL 11   HYDROcodone-acetaminophen (NORCO/VICODIN) 5-325 MG tablet Take 1 tablet by mouth every 6 (six) hours as needed for moderate pain. 15 tablet 0   lidocaine-prilocaine (EMLA) cream Apply to affected area once 30 g 3   metFORMIN (GLUCOPHAGE-XR) 500 MG 24 hr tablet Take 1 tablet (500 mg total) by mouth daily with breakfast. 90 tablet 3   prochlorperazine (COMPAZINE) 10 MG tablet Take 1 tablet (10 mg total) by mouth every 6 (six) hours as needed (Nausea or vomiting). 30 tablet 1   zolpidem (AMBIEN) 5 MG tablet Take 1 tablet (5 mg total) by mouth at bedtime as needed for sleep. 20 tablet 0   No current facility-administered medications for this visit.    SURGICAL HISTORY:  Past Surgical History:  Procedure Laterality Date   BROW LIFT     CESAREAN SECTION     COSMETIC SURGERY  1995   Nasal   MELANOMA EXCISION WITH SENTINEL LYMPH NODE BIOPSY Left 10/24/2012   Procedure: MELANOMA wide EXCISION left lateral thigh WITH SENTINEL LYMPH NODE BIOPSY left groin;  Surgeon: Edward Jolly, MD;  Location: [facility];  Service: General;  Laterality: Left;   NASAL SEPTUM SURGERY     PORTACATH PLACEMENT Right 12/02/2020   Procedure: INSERTION PORT-A-CATH;  Surgeon: Donnie Mesa, MD;  Location: [facility];  Service: General;  Laterality: Right;    REVIEW OF SYSTEMS:   Review of Systems  Constitutional: Negative for appetite change, chills,  fatigue, fever and unexpected weight change.  HENT: Negative for mouth sores, nosebleeds, sore throat and trouble swallowing.   Eyes: Negative for eye problems and icterus.  Respiratory: Negative for cough, hemoptysis, shortness of breath and wheezing.   Cardiovascular: Negative for chest pain and leg swelling.  Gastrointestinal: Positive for mild constipation. Negative for abdominal pain, diarrhea, nausea and vomiting.  Genitourinary: Negative for bladder incontinence, difficulty urinating, dysuria, frequency and hematuria.   Musculoskeletal: Negative for back pain, gait problem, neck pain and neck stiffness.  Skin: Negative for itching and rash.  Neurological: Negative for dizziness, extremity weakness, gait problem, headaches, light-headedness and seizures.  Hematological: Negative for adenopathy. Does not bruise/bleed easily.  Psychiatric/Behavioral: Negative for confusion, depression and sleep disturbance. The patient is not nervous/anxious.     PHYSICAL EXAMINATION:  Blood pressure 125/77, pulse 78, temperature 97.9 F (36.6 C), temperature source Tympanic, resp. rate 18, height $RemoveBe'5\' 6"'jmOGejsbN$  (1.676 m), weight 157 lb 8 oz (71.4 kg), last menstrual period 04/27/2016, SpO2 99 %.  ECOG PERFORMANCE STATUS: 0-1  Physical Exam  Constitutional: Oriented to person, place, and time and well-developed, well-nourished, and in no distress.  HENT:  Head: Normocephalic and atraumatic.  Mouth/Throat: Oropharynx is clear and moist. No oropharyngeal exudate.  Eyes: Conjunctivae are normal. Right eye exhibits no discharge. Left eye exhibits no discharge. No scleral icterus.  Neck: Normal range of motion. Neck supple.  Cardiovascular: Normal rate, regular rhythm, normal heart sounds and intact distal pulses.   Pulmonary/Chest: Effort normal and breath sounds normal. No respiratory distress. No wheezes. No rales.  Abdominal: Soft. Bowel sounds are normal. Exhibits no distension and no mass. There is no  tenderness.  Musculoskeletal: Normal range of motion. Exhibits no edema.  Lymphadenopathy:    No cervical adenopathy.  Neurological: Alert and oriented to person, place, and time. Exhibits normal muscle tone. Gait normal. Coordination normal.  Skin: Skin is warm and dry. No rash noted. Not diaphoretic. No erythema. No pallor.  Psychiatric: Mood, memory and judgment normal.  Vitals reviewed.  Family History:    Her father died at age 40 from COPD. Her mother is currently 35 years old as of 11/2020. Narcissa has no full siblings and three half-siblings-- two brothers and one sister. She reports breast cancer in her mother and three great-aunts, brain tumor in her paternal grandfather, and metastatic melanoma in a cousin.     GYNECOLOGIC HISTORY:  Patient's last menstrual period was 04/27/2016. Menarche: 23/55 years old Age at first live birth: 55 years old [place] P 2 LMP 2016 Contraceptive used for 1-2 years HRT estrogen cream irregularly Hysterectomy? no BSO? 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 She lives in [place], [place] but travels here frequently for work. Husband [name] works in Press photographer for [employer]. She lives at home with [name] and their daughter [name], who is 53 and is a Ship broker at [employer]. Daughter [name], age 37, is a Secretary/administrator in [place]. [place]. Anarie is not a Designer, fashion/clothing.                          ADVANCED DIRECTIVES: In the absence of any documentation to the contrary, the patient's spouse is their HCPOA  LABORATORY DATA: Lab Results  Component Value Date   WBC 4.3 12/10/2020   HGB 12.8 12/10/2020   HCT 36.4 12/10/2020   MCV 87.9 12/10/2020   PLT 237 12/10/2020      Chemistry      Component Value Date/Time   NA 141 12/10/2020 1030   K 4.1 12/10/2020 1030   CL 107 12/10/2020 1030   CO2 27 12/10/2020 1030   BUN 12 12/10/2020 1030   CREATININE 0.78 12/10/2020  1030      Component Value Date/Time   CALCIUM 9.5 12/10/2020 1030   ALKPHOS 81 12/10/2020 1030   AST 119 (H) 12/10/2020 1030   ALT 129 (H) 12/10/2020 1030   BILITOT 0.4 12/10/2020 1030       RADIOGRAPHIC STUDIES:  MR BREAST BILATERAL W WO CONTRAST INC CAD  Result Date: 11/26/2020 CLINICAL DATA:  54 year old female with newly diagnosed invasive right breast carcinoma and positive axillary node. LABS:  None. EXAM: BILATERAL BREAST MRI WITH AND WITHOUT CONTRAST TECHNIQUE: Multiplanar, multisequence MR images of both breasts were obtained prior to and following the intravenous administration of 7 ml of Gadavist Three-dimensional MR images were rendered by post-processing of the original MR data on an independent workstation. The three-dimensional MR  images were interpreted, and findings are reported in the following complete MRI report for this study. Three dimensional images were evaluated at the independent interpreting workstation using the DynaCAD thin client. COMPARISON:  None. FINDINGS: Breast composition: b. Scattered fibroglandular tissue. Background parenchymal enhancement: Mild Right breast: There is susceptibility artifact in the upper outer right breast consistent with a biopsy marking clip associated with this is an irregular spiculated enhancing mass measuring 2.4 x 1.3 x 2.1 cm (series 7, image 65). There is overlying tethering to the skin. No abnormal skin thickening or enhancement. There are additional suspicious foci and non mass enhancement extending anteriorly from the mass towards the nipple. The linear non mass enhancement extending to the base of the nipple spans approximately 1.8 cm (series 11, image 70). This is located approximately 5.2 cm anterior to the dominant mass. There is an additional focus of enhancement in the upper inner anterior aspect of the breast (series 11, image 61) measuring 0.6 cm. There is subtle clumped non mass enhancement in the lower outer right breast  with susceptibility artifact at the posterior aspect, representing the site of recently biopsied fibrocystic changes. Left breast: No mass or abnormal enhancement. Lymph nodes: There is a small irregular lymph node with associated susceptibility artifact in the room low right axilla (series 11, image 51), consistent with biopsy proven metastatic node. There is 1 additional abnormal lymph node with cortical thickness measuring 0.6 cm (series 2, image 11). There is also a mildly asymmetric suspicious intramammary lymph node measuring 0.7 cm (series 6, image 77). No left axillary adenopathy. Ancillary findings:  None. IMPRESSION: 1. Biopsy-proven malignancy in the upper outer right breast measuring 2.4 x 1.3 x 2.1 cm. There is associated skin tethering. There are additional suspicious foci and linear non mass enhancement in the outer right breast extending anteriorly from the mass towards the nipple. The linear non mass enhancement extending to the base of the nipple spans approximately 1.8 cm. This is located approximately 5.2 cm anterior to the dominant mass. 2. Additional focus of enhancement in the upper inner anterior aspect of the right breast measuring 0.6 cm. 3. Biopsy-proven metastatic lymph node in the low right axilla with 1 additional suspicious lymph node. There is also an asymmetric, suspicious intramammary lymph node at the level of the biopsy improved malignancy in the upper outer quadrant. 4. No MRI evidence of malignancy in the left breast. RECOMMENDATION: If breast conservation is being considered recommend MRI guided biopsy x 2 of the retroareolar linear non mass enhancement and of the focus of enhancement in the upper inner right breast. BI-RADS CATEGORY  4: Suspicious. Electronically Signed   By: Emmaline Kluver M.D.   On: 11/26/2020 14:43  DG CHEST PORT 1 VIEW  Result Date: 12/02/2020 CLINICAL DATA:  Status post Port-A-Cath insertion EXAM: PORTABLE CHEST 1 VIEW COMPARISON:  None. FINDINGS:  There is a right chest wall port in place with the tip in the mid SVC. The cardiomediastinal silhouette is normal. The lungs are clear, with no focal consolidation or pulmonary edema. There is no pleural effusion or pneumothorax The bones are unremarkable. IMPRESSION: Right chest wall port tip is in the mid SVC. Electronically Signed   By: Lesia Hausen M.D.   On: 12/02/2020 14:41   DG Fluoro Guide CV Line-No Report  Result Date: 12/02/2020 Fluoroscopy was utilized by the requesting physician.  No radiographic interpretation.   ECHOCARDIOGRAM COMPLETE  Result Date: 11/26/2020    ECHOCARDIOGRAM REPORT   Patient Name:  Robin Farley Date of Exam: 11/26/2020 Medical Rec #:  [id number]       Height:       66.0 in Accession #:    [id number]      Weight:       154.0 lb Date of Birth:  06-Jan-1966       BSA:          1.790 m Patient Age:    33 years        BP:           133/82 mmHg Patient Gender: F               HR:           71 bpm. Exam Location:  Outpatient Procedure: 2D Echo, 3D Echo, Cardiac Doppler, Color Doppler and Strain Analysis Indications:    Z51.11 Encounter for antineoplastic chemotheraphy  History:        Patient has no prior history of Echocardiogram examinations.                 Risk Factors:Diabetes and Dyslipidemia. Cancer.  Sonographer:    Tiffany Dance RVT Referring Phys: [id number] GUSTAV C MAGRINAT IMPRESSIONS  1. Left ventricular ejection fraction by 3D volume is 64 %. The left ventricle has normal function. The left ventricle has no regional wall motion abnormalities. Left ventricular diastolic parameters were normal.  2. Right ventricular systolic function is normal. The right ventricular size is normal.  3. The mitral valve is normal in structure. Trivial mitral valve regurgitation. No evidence of mitral stenosis.  4. The aortic valve is normal in structure. Aortic valve regurgitation is not visualized. No aortic stenosis is present. FINDINGS  Left Ventricle: Left ventricular ejection fraction  by 3D volume is 64 %. The left ventricle has normal function. The left ventricle has no regional wall motion abnormalities. The left ventricular internal cavity size was normal in size. There is no left  ventricular hypertrophy. Left ventricular diastolic parameters were normal. Right Ventricle: The right ventricular size is normal. Right vetricular wall thickness was not well visualized. Right ventricular systolic function is normal. Left Atrium: Left atrial size was normal in size. Right Atrium: Right atrial size was normal in size. Pericardium: There is no evidence of pericardial effusion. Mitral Valve: The mitral valve is normal in structure. Trivial mitral valve regurgitation. No evidence of mitral valve stenosis. Tricuspid Valve: The tricuspid valve is grossly normal. Tricuspid valve regurgitation is not demonstrated. Aortic Valve: The aortic valve is normal in structure. Aortic valve regurgitation is not visualized. No aortic stenosis is present. Pulmonic Valve: The pulmonic valve was normal in structure. Pulmonic valve regurgitation is not visualized. Aorta: The aortic root and ascending aorta are structurally normal, with no evidence of dilitation. IAS/Shunts: The atrial septum is grossly normal.  LEFT VENTRICLE PLAX 2D LVIDd:         4.60 cm         Diastology LVIDs:         2.90 cm         LV e' medial:    5.98 cm/s LV PW:         1.00 cm         LV E/e' medial:  9.1 LV IVS:        1.00 cm         LV e' lateral:   9.03 cm/s LVOT diam:     2.10 cm  LV E/e' lateral: 6.1 LV SV:         60 LV SV Index:   34              2D LVOT Area:     3.46 cm        Longitudinal                                Strain                                2D Strain GLS  -22.4 %                                Avg:                                 3D Volume EF                                LV 3D EF:    Left                                             ventricul                                             ar                                              ejection                                             fraction                                             by 3D                                             volume is                                             64 %.                                 3D Volume EF:  3D EF:        64 %                                LV EDV:       95 ml                                LV ESV:       34 ml                                LV SV:        61 ml RIGHT VENTRICLE            IVC RV Basal diam:  2.70 cm    IVC diam: 1.60 cm RV S prime:     8.70 cm/s TAPSE (M-mode): 1.7 cm LEFT ATRIUM             Index       RIGHT ATRIUM           Index LA diam:        3.40 cm 1.90 cm/m  RA Area:     12.90 cm LA Vol (A2C):   41.6 ml 23.25 ml/m RA Volume:   28.10 ml  15.70 ml/m LA Vol (A4C):   38.0 ml 21.23 ml/m LA Biplane Vol: 40.5 ml 22.63 ml/m  AORTIC VALVE             PULMONIC VALVE LVOT Vmax:   85.10 cm/s  PR End Diast Vel: 2.23 msec LVOT Vmean:  54.800 cm/s LVOT VTI:    0.174 m  AORTA Ao Root diam: 4.00 cm Ao Asc diam:  3.40 cm MITRAL VALVE MV Area (PHT): 3.19 cm    SHUNTS MV Decel Time: 238 msec    Systemic VTI:  0.17 m MV E velocity: 54.70 cm/s  Systemic Diam: 2.10 cm MV A velocity: 59.60 cm/s MV E/A ratio:  0.92 Mertie Moores MD Electronically signed by Mertie Moores MD Signature Date/Time: 11/26/2020/5:22:55 PM    Final    MM CLIP PLACEMENT RIGHT  Result Date: 12/09/2020 CLINICAL DATA:  Status post MRI guided biopsy of the right breast. EXAM: 3D DIAGNOSTIC RIGHT MAMMOGRAM POST STEREOTACTIC BIOPSY COMPARISON:  Previous exam(s). FINDINGS: 3D Mammographic images were obtained following MRI guided biopsy of the right breast x2. Both post biopsy marking clips are in the expected locations. IMPRESSION: Appropriate positioning of the cylinder and barbell shaped biopsy marking clips in the subareolar right breast. Final Assessment: Post Procedure Mammograms for Marker Placement  Electronically Signed   By: Kristopher Oppenheim M.D.   On: 12/09/2020 10:04  MR RT BREAST BX W LOC DEV 1ST LESION IMAGE BX SPEC MR GUIDE  Result Date: 12/09/2020 CLINICAL DATA:  55 year old female with newly diagnosed right breast cancer in 2 additional sites of indeterminate enhancement in the subareolar right breast. EXAM: MRI GUIDED CORE NEEDLE BIOPSY OF THE RIGHT BREAST x2 TECHNIQUE: Multiplanar, multisequence MR imaging of the right breast was performed both before and after administration of intravenous contrast. CONTRAST:  48mL GADAVIST GADOBUTROL 1 MMOL/ML IV SOLN COMPARISON:  Previous exams. FINDINGS: I met with the patient, and we discussed the procedure of MRI guided biopsy, including risks, benefits, and alternatives. Specifically, we discussed the risks of infection, bleeding, tissue injury, clip migration, and inadequate  sampling. Informed, written consent was given. The usual time out protocol was performed immediately prior to the procedure. Using sterile technique, 1% Lidocaine, MRI guidance, and a 9 gauge vacuum assisted device, biopsy was performed of of the subareolar right breast using a lateral approach. At the conclusion of the procedure, a cylinder shaped tissue marker clip was deployed into the biopsy cavity. Using sterile technique, 1% Lidocaine, MRI guidance, and a 9 gauge vacuum assisted device, biopsy was performed of the subareolar right breast using a lateral approach. At the conclusion of the procedure, a barbell shaped tissue marker clip was deployed into the biopsy cavity. Follow-up 2-view mammogram was performed and dictated separately. IMPRESSION: MRI guided biopsy of the right breast x2. No apparent complications. Electronically Signed   By: Kristopher Oppenheim M.D.   On: 12/09/2020 10:03   MR RT BREAST BX W LOC DEV EA ADD LESION IMAGE BX SPEC MR GUIDE  Result Date: 12/09/2020 CLINICAL DATA:  55 year old female with newly diagnosed right breast cancer in 2 additional sites of  indeterminate enhancement in the subareolar right breast. EXAM: MRI GUIDED CORE NEEDLE BIOPSY OF THE RIGHT BREAST x2 TECHNIQUE: Multiplanar, multisequence MR imaging of the right breast was performed both before and after administration of intravenous contrast. CONTRAST:  73mL GADAVIST GADOBUTROL 1 MMOL/ML IV SOLN COMPARISON:  Previous exams. FINDINGS: I met with the patient, and we discussed the procedure of MRI guided biopsy, including risks, benefits, and alternatives. Specifically, we discussed the risks of infection, bleeding, tissue injury, clip migration, and inadequate sampling. Informed, written consent was given. The usual time out protocol was performed immediately prior to the procedure. Using sterile technique, 1% Lidocaine, MRI guidance, and a 9 gauge vacuum assisted device, biopsy was performed of of the subareolar right breast using a lateral approach. At the conclusion of the procedure, a cylinder shaped tissue marker clip was deployed into the biopsy cavity. Using sterile technique, 1% Lidocaine, MRI guidance, and a 9 gauge vacuum assisted device, biopsy was performed of the subareolar right breast using a lateral approach. At the conclusion of the procedure, a barbell shaped tissue marker clip was deployed into the biopsy cavity. Follow-up 2-view mammogram was performed and dictated separately. IMPRESSION: MRI guided biopsy of the right breast x2. No apparent complications. Electronically Signed   By: Kristopher Oppenheim M.D.   On: 12/09/2020 10:03     ASSESSMENT: 55 y.o. [place] woman status post right breast upper outer quadrant biopsy 11/08/2020 for a clinical T1c N1, stage IIB invasive ductal carcinoma, grade 3, functionally triple negative, with an MIB-1 of 30%   (1) genetics test   (2) neoadjuvant chemotherapy to consist of carboplatin and paclitaxel with pembrolizumab every 21 days x 4 starting 12/03/2020, to be followed by doxorubicin and cyclophosphamide with pembrolizumab every 21  days x 4   (3) definitive surgery to follow   (4) adjuvant radiation     PLAN: Avamarie is a very pleastant 58 female recently diagnosed with breast cancer.  She underwent a biopsy yesterday and the results are still pending.   She underwent her first cycle of treatment last week and she tolerated it well.  She is scheduled to have week 2 of treatment today with single agent taxol.  Labs are reviewed which show some elevated LFTs. Unclear if this was related to her chemotherapy/immunotherapy last week or tylenol usage yesterday do to pain following the breast biopsy. She denies current nausea/vomiting or abdominal pain. Advised to avoid tylenol and alcohol for the  time being. I also advised her to avoid any herbal or supplements OTC. I reviewed with Dr. Lindi Adie who recommended reducing her dose of taxol to 65 mg/m2 today.   She will have repeat labs next week. Further dose recommendations for next weeks treatment will be based on labs at that time.    We will see her back for follow-up visit in 2 weeks on 12/24/20 before starting her next cycle of treatment.   We reviewed how to take Claritin for the upcoming injections.   She is planning on using the dignity cap today.   She has Ambien if needed for sleep. She knows that Dr. Jana Hakim recommended taking it no more than twice a week at the most.   The patient was advised to call immediately if she has any concerning symptoms in the interval. The patient voices understanding of current disease status and treatment options and is in agreement with the current care plan. All questions were answered. The patient knows to call the clinic with any problems, questions or concerns. We can certainly see the patient much sooner if necessary    No orders of the defined types were placed in this encounter.    The total time spent in the appointment was 30-39 minutes.  Laddie Math L Ami Thornsberry, PA-C 12/10/20

## 2020-12-09 ENCOUNTER — Other Ambulatory Visit: Payer: Self-pay

## 2020-12-09 ENCOUNTER — Ambulatory Visit
Admission: RE | Admit: 2020-12-09 | Discharge: 2020-12-09 | Disposition: A | Discharge status: Home / Self Care | Payer: Managed Care, Other (non HMO) | Source: Ambulatory Visit | Attending: Oncology | Admitting: Oncology

## 2020-12-09 DIAGNOSIS — C50411 Malignant neoplasm of upper-outer quadrant of right female breast: Secondary | ICD-10-CM

## 2020-12-09 DIAGNOSIS — Z170 Estrogen receptor positive status [ER+]: Secondary | ICD-10-CM

## 2020-12-09 DIAGNOSIS — Z171 Estrogen receptor negative status [ER-]: Secondary | ICD-10-CM

## 2020-12-09 DIAGNOSIS — R928 Other abnormal and inconclusive findings on diagnostic imaging of breast: Secondary | ICD-10-CM

## 2020-12-09 MED ORDER — GADOBUTROL 1 MMOL/ML IV SOLN
7.0000 mL | Freq: Once | INTRAVENOUS | Status: AC | PRN
  Administered 2020-12-09: 7 mL via INTRAVENOUS

## 2020-12-10 ENCOUNTER — Inpatient Hospital Stay: Payer: Managed Care, Other (non HMO)

## 2020-12-10 ENCOUNTER — Inpatient Hospital Stay: Payer: Managed Care, Other (non HMO) | Admitting: Physician Assistant

## 2020-12-10 ENCOUNTER — Encounter: Payer: Self-pay | Admitting: Physician Assistant

## 2020-12-10 ENCOUNTER — Encounter: Payer: Self-pay | Admitting: *Deleted

## 2020-12-10 VITALS — BP 125/77 | HR 78 | Temp 97.9°F | Resp 18 | Ht 66.0 in | Wt 157.5 lb

## 2020-12-10 DIAGNOSIS — C50411 Malignant neoplasm of upper-outer quadrant of right female breast: Secondary | ICD-10-CM | POA: Diagnosis not present

## 2020-12-10 DIAGNOSIS — Z5112 Encounter for antineoplastic immunotherapy: Secondary | ICD-10-CM | POA: Diagnosis not present

## 2020-12-10 DIAGNOSIS — Z95828 Presence of other vascular implants and grafts: Secondary | ICD-10-CM

## 2020-12-10 DIAGNOSIS — R7989 Other specified abnormal findings of blood chemistry: Secondary | ICD-10-CM | POA: Insufficient documentation

## 2020-12-10 DIAGNOSIS — Z171 Estrogen receptor negative status [ER-]: Secondary | ICD-10-CM

## 2020-12-10 LAB — CBC WITH DIFFERENTIAL (CANCER CENTER ONLY)
Abs Immature Granulocytes: 0.02 10*3/uL (ref 0.00–0.07)
Basophils Absolute: 0 10*3/uL (ref 0.0–0.1)
Basophils Relative: 1 %
Eosinophils Absolute: 0.1 10*3/uL (ref 0.0–0.5)
Eosinophils Relative: 2 %
HCT: 36.4 % (ref 36.0–46.0)
Hemoglobin: 12.8 g/dL (ref 12.0–15.0)
Immature Granulocytes: 1 %
Lymphocytes Relative: 31 %
Lymphs Abs: 1.3 10*3/uL (ref 0.7–4.0)
MCH: 30.9 pg (ref 26.0–34.0)
MCHC: 35.2 g/dL (ref 30.0–36.0)
MCV: 87.9 fL (ref 80.0–100.0)
Monocytes Absolute: 0.2 10*3/uL (ref 0.1–1.0)
Monocytes Relative: 5 %
Neutro Abs: 2.6 10*3/uL (ref 1.7–7.7)
Neutrophils Relative %: 60 %
Platelet Count: 237 10*3/uL (ref 150–400)
RBC: 4.14 MIL/uL (ref 3.87–5.11)
RDW: 11.6 % (ref 11.5–15.5)
WBC Count: 4.3 10*3/uL (ref 4.0–10.5)
nRBC: 0 % (ref 0.0–0.2)

## 2020-12-10 LAB — CMP (CANCER CENTER ONLY)
ALT: 129 U/L — ABNORMAL HIGH (ref 0–44)
AST: 119 U/L — ABNORMAL HIGH (ref 15–41)
Albumin: 3.8 g/dL (ref 3.5–5.0)
Alkaline Phosphatase: 81 U/L (ref 38–126)
Anion gap: 7 (ref 5–15)
BUN: 12 mg/dL (ref 6–20)
CO2: 27 mmol/L (ref 22–32)
Calcium: 9.5 mg/dL (ref 8.9–10.3)
Chloride: 107 mmol/L (ref 98–111)
Creatinine: 0.78 mg/dL (ref 0.44–1.00)
GFR, Estimated: >60 mL/min (ref >60)
Glucose, Bld: 106 mg/dL — ABNORMAL HIGH (ref 70–99)
Potassium: 4.1 mmol/L (ref 3.5–5.1)
Sodium: 141 mmol/L (ref 135–145)
Total Bilirubin: 0.4 mg/dL (ref 0.3–1.2)
Total Protein: 7 g/dL (ref 6.5–8.1)

## 2020-12-10 LAB — TSH: TSH: 1.333 u[IU]/mL (ref 0.308–3.960)

## 2020-12-10 MED ORDER — SODIUM CHLORIDE 0.9 % IV SOLN
65.0000 mg/m2 | Freq: Once | INTRAVENOUS | Status: AC
  Administered 2020-12-10: 120 mg via INTRAVENOUS
  Filled 2020-12-10: qty 20

## 2020-12-10 MED ORDER — FAMOTIDINE 20 MG IN NS 100 ML IVPB
20.0000 mg | Freq: Once | INTRAVENOUS | Status: AC
  Administered 2020-12-10: 20 mg via INTRAVENOUS
  Filled 2020-12-10: qty 100

## 2020-12-10 MED ORDER — SODIUM CHLORIDE 0.9% FLUSH
10.0000 mL | INTRAVENOUS | Status: DC | PRN
  Administered 2020-12-10: 10 mL

## 2020-12-10 MED ORDER — SODIUM CHLORIDE 0.9 % IV SOLN
10.0000 mg | Freq: Once | INTRAVENOUS | Status: AC
  Administered 2020-12-10: 10 mg via INTRAVENOUS
  Filled 2020-12-10: qty 10

## 2020-12-10 MED ORDER — DIPHENHYDRAMINE HCL 50 MG/ML IJ SOLN
50.0000 mg | Freq: Once | INTRAMUSCULAR | Status: AC
  Administered 2020-12-10: 50 mg via INTRAVENOUS
  Filled 2020-12-10: qty 1

## 2020-12-10 MED ORDER — HEPARIN SOD (PORK) LOCK FLUSH 100 UNIT/ML IV SOLN
500.0000 [IU] | Freq: Once | INTRAVENOUS | Status: AC | PRN
  Administered 2020-12-10: 500 [IU]

## 2020-12-10 MED ORDER — SODIUM CHLORIDE 0.9 % IV SOLN: Freq: Once | INTRAVENOUS | Status: AC

## 2020-12-10 NOTE — Progress Notes (Signed)
Ok to treat with elevated LFTs, see new dosing, pharmacy aware. Per [employer], PA

## 2020-12-10 NOTE — Patient Instructions (Signed)
[facility]  Discharge Instructions: Thank you for choosing [facility] to provide your oncology and hematology care.   If you have a lab appointment with the Cancer Center, please go directly to the Cancer Center and check in at the registration area.   Wear comfortable clothing and clothing appropriate for easy access to any Portacath or PICC line.   We strive to give you quality time with your provider. You may need to reschedule your appointment if you arrive late (15 or more minutes).  Arriving late affects you and other patients whose appointments are after yours.  Also, if you miss three or more appointments without notifying the office, you may be dismissed from the clinic at the provider's discretion.      For prescription refill requests, have your pharmacy contact our office and allow 72 hours for refills to be completed.    Today you received the following chemotherapy and/or immunotherapy agents taxol      To help prevent nausea and vomiting after your treatment, we encourage you to take your nausea medication as directed.  BELOW ARE SYMPTOMS THAT SHOULD BE REPORTED IMMEDIATELY: *FEVER GREATER THAN 100.4 F (38 C) OR HIGHER *CHILLS OR SWEATING *NAUSEA AND VOMITING THAT IS NOT CONTROLLED WITH YOUR NAUSEA MEDICATION *UNUSUAL SHORTNESS OF BREATH *UNUSUAL BRUISING OR BLEEDING *URINARY PROBLEMS (pain or burning when urinating, or frequent urination) *BOWEL PROBLEMS (unusual diarrhea, constipation, pain near the anus) TENDERNESS IN MOUTH AND THROAT WITH OR WITHOUT PRESENCE OF ULCERS (sore throat, sores in mouth, or a toothache) UNUSUAL RASH, SWELLING OR PAIN  UNUSUAL VAGINAL DISCHARGE OR ITCHING   Items with * indicate a potential emergency and should be followed up as soon as possible or go to the Emergency Department if any problems should occur.  Please show the CHEMOTHERAPY ALERT CARD or IMMUNOTHERAPY ALERT CARD at check-in to the  Emergency Department and triage nurse.  Should you have questions after your visit or need to cancel or reschedule your appointment, please contact [facility]  Dept: [phone]  and follow the prompts.  Office hours are 8:00 a.m. to 4:30 p.m. Monday - Friday. Please note that voicemails left after 4:00 p.m. may not be returned until the following business day.  We are closed weekends and major holidays. You have access to a nurse at all times for urgent questions. Please call the main number to the clinic Dept: [phone] and follow the prompts.   For any non-urgent questions, you may also contact your provider using MyChart. We now offer e-Visits for anyone 18 and older to request care online for non-urgent symptoms. For details visit [email or web address].   Also download the MyChart app! Go to the app store, search "MyChart", open the app, select [facility], and log in with your MyChart username and password.  Due to Covid, a mask is required upon entering the hospital/clinic. If you do not have a mask, one will be given to you upon arrival. For doctor visits, patients may have 1 support person aged 18 or older with them. For treatment visits, patients cannot have anyone with them due to current Covid guidelines and our immunocompromised population.   

## 2020-12-13 ENCOUNTER — Other Ambulatory Visit: Payer: Self-pay

## 2020-12-13 DIAGNOSIS — Z171 Estrogen receptor negative status [ER-]: Secondary | ICD-10-CM

## 2020-12-13 MED FILL — Dexamethasone Sodium Phosphate Inj 100 MG/10ML: INTRAMUSCULAR | Qty: 1 | Status: AC

## 2020-12-17 ENCOUNTER — Inpatient Hospital Stay: Payer: Managed Care, Other (non HMO)

## 2020-12-17 ENCOUNTER — Encounter: Payer: Self-pay | Admitting: *Deleted

## 2020-12-17 ENCOUNTER — Other Ambulatory Visit: Payer: Self-pay | Admitting: Oncology

## 2020-12-17 ENCOUNTER — Other Ambulatory Visit: Payer: Self-pay

## 2020-12-17 ENCOUNTER — Inpatient Hospital Stay: Payer: Managed Care, Other (non HMO) | Attending: Oncology

## 2020-12-17 VITALS — BP 125/75 | HR 86 | Temp 98.5°F | Resp 18 | Wt 160.0 lb

## 2020-12-17 DIAGNOSIS — Z79899 Other long term (current) drug therapy: Secondary | ICD-10-CM | POA: Diagnosis not present

## 2020-12-17 DIAGNOSIS — Z5111 Encounter for antineoplastic chemotherapy: Secondary | ICD-10-CM | POA: Diagnosis present

## 2020-12-17 DIAGNOSIS — Z95828 Presence of other vascular implants and grafts: Secondary | ICD-10-CM

## 2020-12-17 DIAGNOSIS — C773 Secondary and unspecified malignant neoplasm of axilla and upper limb lymph nodes: Secondary | ICD-10-CM | POA: Insufficient documentation

## 2020-12-17 DIAGNOSIS — Z171 Estrogen receptor negative status [ER-]: Secondary | ICD-10-CM | POA: Diagnosis not present

## 2020-12-17 DIAGNOSIS — E785 Hyperlipidemia, unspecified: Secondary | ICD-10-CM | POA: Insufficient documentation

## 2020-12-17 DIAGNOSIS — Z5189 Encounter for other specified aftercare: Secondary | ICD-10-CM | POA: Diagnosis not present

## 2020-12-17 DIAGNOSIS — E119 Type 2 diabetes mellitus without complications: Secondary | ICD-10-CM | POA: Diagnosis not present

## 2020-12-17 DIAGNOSIS — Z7984 Long term (current) use of oral hypoglycemic drugs: Secondary | ICD-10-CM | POA: Diagnosis not present

## 2020-12-17 DIAGNOSIS — C50411 Malignant neoplasm of upper-outer quadrant of right female breast: Secondary | ICD-10-CM | POA: Insufficient documentation

## 2020-12-17 DIAGNOSIS — Z7952 Long term (current) use of systemic steroids: Secondary | ICD-10-CM | POA: Insufficient documentation

## 2020-12-17 DIAGNOSIS — Z5112 Encounter for antineoplastic immunotherapy: Secondary | ICD-10-CM | POA: Insufficient documentation

## 2020-12-17 LAB — CMP (CANCER CENTER ONLY)
ALT: 189 U/L — ABNORMAL HIGH (ref 0–44)
AST: 93 U/L — ABNORMAL HIGH (ref 15–41)
Albumin: 3.6 g/dL (ref 3.5–5.0)
Alkaline Phosphatase: 88 U/L (ref 38–126)
Anion gap: 9 (ref 5–15)
BUN: 17 mg/dL (ref 6–20)
CO2: 25 mmol/L (ref 22–32)
Calcium: 9.3 mg/dL (ref 8.9–10.3)
Chloride: 109 mmol/L (ref 98–111)
Creatinine: 0.8 mg/dL (ref 0.44–1.00)
GFR, Estimated: >60 mL/min (ref >60)
Glucose, Bld: 115 mg/dL — ABNORMAL HIGH (ref 70–99)
Potassium: 4.1 mmol/L (ref 3.5–5.1)
Sodium: 143 mmol/L (ref 135–145)
Total Bilirubin: 0.3 mg/dL (ref 0.3–1.2)
Total Protein: 6.7 g/dL (ref 6.5–8.1)

## 2020-12-17 LAB — CBC WITH DIFFERENTIAL (CANCER CENTER ONLY)
Abs Immature Granulocytes: 0.02 10*3/uL (ref 0.00–0.07)
Basophils Absolute: 0 10*3/uL (ref 0.0–0.1)
Basophils Relative: 1 %
Eosinophils Absolute: 0.1 10*3/uL (ref 0.0–0.5)
Eosinophils Relative: 2 %
HCT: 36.1 % (ref 36.0–46.0)
Hemoglobin: 12.3 g/dL (ref 12.0–15.0)
Immature Granulocytes: 1 %
Lymphocytes Relative: 37 %
Lymphs Abs: 1.4 10*3/uL (ref 0.7–4.0)
MCH: 31.1 pg (ref 26.0–34.0)
MCHC: 34.1 g/dL (ref 30.0–36.0)
MCV: 91.4 fL (ref 80.0–100.0)
Monocytes Absolute: 0.3 10*3/uL (ref 0.1–1.0)
Monocytes Relative: 8 %
Neutro Abs: 2 10*3/uL (ref 1.7–7.7)
Neutrophils Relative %: 51 %
Platelet Count: 226 10*3/uL (ref 150–400)
RBC: 3.95 MIL/uL (ref 3.87–5.11)
RDW: 11.9 % (ref 11.5–15.5)
WBC Count: 3.8 10*3/uL — ABNORMAL LOW (ref 4.0–10.5)
nRBC: 0 % (ref 0.0–0.2)

## 2020-12-17 MED ORDER — SODIUM CHLORIDE 0.9% FLUSH
10.0000 mL | INTRAVENOUS | Status: DC | PRN
Start: 2020-12-17 — End: 2020-12-17
  Administered 2020-12-17: 10 mL

## 2020-12-17 MED ORDER — SODIUM CHLORIDE 0.9% FLUSH
10.0000 mL | INTRAVENOUS | Status: DC | PRN
  Administered 2020-12-17: 10 mL

## 2020-12-17 MED ORDER — HEPARIN SOD (PORK) LOCK FLUSH 100 UNIT/ML IV SOLN
500.0000 [IU] | Freq: Once | INTRAVENOUS | Status: AC | PRN
  Administered 2020-12-17: 500 [IU]

## 2020-12-17 NOTE — Progress Notes (Signed)
Robin Farley's LFTs are up a bit ongoing to hold her Taxol today.  We will reassess when she returns to see me on 12/23/2020

## 2020-12-17 NOTE — Progress Notes (Signed)
Per Dr. Jana Hakim, no treatment today due to elevated LFTs. Patient made aware and verbalized understanding.

## 2020-12-18 ENCOUNTER — Telehealth: Payer: Self-pay | Admitting: *Deleted

## 2020-12-18 ENCOUNTER — Inpatient Hospital Stay: Payer: Managed Care, Other (non HMO)

## 2020-12-18 VITALS — BP 145/77 | HR 85 | Temp 98.4°F | Resp 18

## 2020-12-18 DIAGNOSIS — Z5112 Encounter for antineoplastic immunotherapy: Secondary | ICD-10-CM | POA: Diagnosis not present

## 2020-12-18 DIAGNOSIS — C50411 Malignant neoplasm of upper-outer quadrant of right female breast: Secondary | ICD-10-CM

## 2020-12-18 MED ORDER — FILGRASTIM-AAFI 480 MCG/0.8ML IJ SOSY
480.0000 ug | PREFILLED_SYRINGE | Freq: Once | INTRAMUSCULAR | Status: AC
  Administered 2020-12-18: 480 ug via SUBCUTANEOUS
  Filled 2020-12-18: qty 0.8

## 2020-12-18 NOTE — Patient Instructions (Signed)
Filgrastim, G-CSF injection What is this medication? FILGRASTIM, G-CSF (fil GRA stim) is a granulocyte colony-stimulating factor that stimulates the growth of neutrophils, a type of white blood cell (WBC) important in the body's fight against infection. It is used to reduce the incidence of fever and infection in patients with certain types of cancer who are receiving chemotherapy that affects the bone marrow, to stimulate blood cell production for removal of WBCs from the body prior to a bone marrow transplantation, to reduce the incidence of fever and infection in patients who have severe chronic neutropenia, and to improve survival outcomes following high-dose radiation exposure that is toxic to the bone marrow. This medicine may be used for other purposes; ask your health care provider or pharmacist if you have questions. COMMON BRAND NAME(S): Neupogen, Nivestym, Releuko, Zarxio What should I tell my care team before I take this medication? They need to know if you have any of these conditions: kidney disease latex allergy ongoing radiation therapy sickle cell disease an unusual or allergic reaction to filgrastim, pegfilgrastim, other medicines, foods, dyes, or preservatives pregnant or trying to get pregnant breast-feeding How should I use this medication? This medicine is for injection under the skin or infusion into a vein. As an infusion into a vein, it is usually given by a health care professional in a hospital or clinic setting. If you get this medicine at home, you will be taught how to prepare and give this medicine. Refer to the Instructions for Use that come with your medication packaging. Use exactly as directed. Take your medicine at regular intervals. Do not take your medicine more often than directed. It is important that you put your used needles and syringes in a special sharps container. Do not put them in a trash can. If you do not have a sharps container, call your pharmacist  or healthcare provider to get one. Talk to your pediatrician regarding the use of this medicine in children. While this drug may be prescribed for children as young as 7 months for selected conditions, precautions do apply. Overdosage: If you think you have taken too much of this medicine contact a poison control center or emergency room at once. NOTE: This medicine is only for you. Do not share this medicine with others. What if I miss a dose? It is important not to miss your dose. Call your doctor or health care professional if you miss a dose. What may interact with this medication? This medicine may interact with the following medications: medicines that may cause a release of neutrophils, such as lithium This list may not describe all possible interactions. Give your health care provider a list of all the medicines, herbs, non-prescription drugs, or dietary supplements you use. Also tell them if you smoke, drink alcohol, or use illegal drugs. Some items may interact with your medicine. What should I watch for while using this medication? Your condition will be monitored carefully while you are receiving this medicine. You may need blood work done while you are taking this medicine. Talk to your health care provider about your risk of cancer. You may be more at risk for certain types of cancer if you take this medicine. What side effects may I notice from receiving this medication? Side effects that you should report to your doctor or health care professional as soon as possible: allergic reactions like skin rash, itching or hives, swelling of the face, lips, or tongue back pain dizziness or feeling faint fever pain, redness, or   irritation at site where injected pinpoint red spots on the skin shortness of breath or breathing problems signs and symptoms of kidney injury like trouble passing urine, change in the amount of urine, or red or dark-brown urine stomach or side pain, or pain at  the shoulder swelling tiredness unusual bleeding or bruising Side effects that usually do not require medical attention (report to your doctor or health care professional if they continue or are bothersome): bone pain cough diarrhea hair loss headache muscle pain This list may not describe all possible side effects. Call your doctor for medical advice about side effects. You may report side effects to FDA at 1-800-FDA-1088. Where should I keep my medication? Keep out of the reach of children. Store in a refrigerator between 2 and 8 degrees C (36 and 46 degrees F). Do not freeze. Keep in carton to protect from light. Throw away this medicine if vials or syringes are left out of the refrigerator for more than 24 hours. Throw away any unused medicine after the expiration date. NOTE: This sheet is a summary. It may not cover all possible information. If you have questions about this medicine, talk to your doctor, pharmacist, or health care provider.  2022 [employer] Standard (2019-04-20 18:47:55)  

## 2020-12-18 NOTE — Telephone Encounter (Signed)
Per MD - pt is to proceed with ordered GCSF injections as ordered ( noted day 15 taxol held yesterday ).  Informed injection room nurse.

## 2020-12-19 ENCOUNTER — Other Ambulatory Visit: Payer: Self-pay

## 2020-12-19 ENCOUNTER — Inpatient Hospital Stay: Payer: Managed Care, Other (non HMO)

## 2020-12-19 VITALS — BP 131/75 | HR 83 | Temp 98.6°F | Resp 16

## 2020-12-19 DIAGNOSIS — Z5112 Encounter for antineoplastic immunotherapy: Secondary | ICD-10-CM | POA: Diagnosis not present

## 2020-12-19 DIAGNOSIS — Z171 Estrogen receptor negative status [ER-]: Secondary | ICD-10-CM

## 2020-12-19 MED ORDER — FILGRASTIM-AAFI 480 MCG/0.8ML IJ SOSY
480.0000 ug | PREFILLED_SYRINGE | Freq: Once | INTRAMUSCULAR | Status: AC
  Administered 2020-12-19: 480 ug via SUBCUTANEOUS

## 2020-12-20 ENCOUNTER — Inpatient Hospital Stay: Payer: Managed Care, Other (non HMO)

## 2020-12-20 VITALS — BP 119/82 | HR 99 | Temp 99.8°F | Resp 14

## 2020-12-20 DIAGNOSIS — Z5112 Encounter for antineoplastic immunotherapy: Secondary | ICD-10-CM | POA: Diagnosis not present

## 2020-12-20 DIAGNOSIS — C50411 Malignant neoplasm of upper-outer quadrant of right female breast: Secondary | ICD-10-CM

## 2020-12-20 MED ORDER — FILGRASTIM-AAFI 480 MCG/0.8ML IJ SOSY
480.0000 ug | PREFILLED_SYRINGE | Freq: Once | INTRAMUSCULAR | Status: AC
  Administered 2020-12-20: 480 ug via SUBCUTANEOUS
  Filled 2020-12-20: qty 0.8

## 2020-12-20 MED FILL — Fosaprepitant Dimeglumine For IV Infusion 150 MG (Base Eq): INTRAVENOUS | Qty: 5 | Status: AC

## 2020-12-20 MED FILL — Dexamethasone Sodium Phosphate Inj 100 MG/10ML: INTRAMUSCULAR | Qty: 1 | Status: AC

## 2020-12-20 NOTE — Progress Notes (Signed)
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   She began neoadjuvant chemotherapy, consisting of carboplatin, taxol, and keytruda, on 12/03/2020. Day 15 taxol was held due to elevated LFTs.  She underwent additional right breast biopsies on 12/09/2020. The results returned since her last visit and pathology ([id number]) showed: 1. Right Breast, retroareolar (cylinder clip)  -invasive ductal carcinoma, grade 2 2. Right Breast, retroareolar (barbell clip)  -tubular adenoma  She due to start her 2d cycle of carboplatin/paclitaxel/pembrolizumab today. She has all the treatment appointments ofr this cycle in place.   REVIEW OF SYSTEMS: [name] had some soreness and discomfort from the biopsy which was near the nipple.  She is having some housing issues which we discussed.  She has had mouth sores and she is treating that with some honey and some baking soda.  She is very concerned about her liver function test issues.  She is having difficulty sleeping chiefly because of nocturia.  She is drinking a lot of fluids.  She did have some bony aches from the growth factors.  She took Claritin 4 days instead of 7.  Otherwise a detailed review of systems today was stable and specifically she has no peripheral  neuropathy symptoms   COVID 19 VACCINATION STATUS: [place] x2, most recently 11/2019; infection 07/2020   HISTORY OF CURRENT ILLNESS: From the original intake note:  Robin Farley presented with a new palpable lump in the right breast. She underwent bilateral diagnostic mammography with tomography and right breast ultrasonography at [facility] on 11/06/2020 showing: breast density category B; 1.9 cm mass at palpable site of concern in right breast at 10 o'clock; 0.8 cm smaller mass in right breast at 9 o'clock; 0.7 cm small mass in right axilla, possibly a replaced lymph node; second mildly suspicious lymph node in axilla.  Accordingly on 11/08/2020 she proceeded to biopsy of the right breast area in question. The pathology from this procedure ([id number]) showed:  1. Right Breast, 10 o'clock - invasive ductal carcinoma, grade 3  - Prognostic indicators significant for: estrogen receptor, 5% "positive "with weak staining intensity (this is functionally negative) and progesterone receptor, 0% negative. Proliferation marker Ki67 at 30%. HER2 equivocal by immunohistochemistry (2+), but negative by fluorescent in situ hybridization with a signals ratio 1.19 and number per cell 1.6. 2. Right Breast, 9 o'clock  - fibrocystic change 3. Right Axilla, lymph node  - metastatic carcinoma involving a lymph node  Cancer Staging Malignant neoplasm of upper-outer quadrant of right breast in female, estrogen receptor negative ([place]) Staging form: Breast, AJCC 8th Edition - Clinical: Stage IIB (cT1c, cN1, cM0, G3, ER-, PR-, HER2-) - Signed by [name], MD on 11/20/2020 Histologic grading system: 3 grade system  The patient's subsequent history is as detailed below.   PAST MEDICAL HISTORY: Past Medical History:  Diagnosis Date   Abdominal pain, generalized 12/27/2008  Acute sinusitis, unspecified 02/26/2007   ADD 03/13/2010   ALLERGIC RHINITIS 02/23/2007   ANEMIA 02/05/2007   Anxiety  11/17/2011   ATTENTION DEFICIT DISORDER, HX OF 02/05/2007   Breast cancer ([place])    Deviated nasal septum 02/05/2007   Diabetes mellitus without complication ([place])    Family history of breast cancer 11/20/2020   Family history of melanoma 11/20/2020   FATIGUE 02/23/2007   HYPERLIPIDEMIA 02/23/2007   Personal history of malignant melanoma 11/20/2020   SWELLING MASS OR LUMP IN HEAD AND NECK 07/27/2008   UTI 02/23/2007    PAST SURGICAL HISTORY: Past Surgical History:  Procedure Laterality Date   BROW LIFT     CESAREAN SECTION     COSMETIC SURGERY  1995   Nasal   MELANOMA EXCISION WITH SENTINEL LYMPH NODE BIOPSY Left 10/24/2012   Procedure: MELANOMA wide EXCISION left lateral thigh WITH SENTINEL LYMPH NODE BIOPSY left groin;  Surgeon: Edward Jolly, MD;  Location: [facility];  Service: General;  Laterality: Left;   NASAL SEPTUM SURGERY     PORTACATH PLACEMENT Right 12/02/2020   Procedure: INSERTION PORT-A-CATH;  Surgeon: Donnie Mesa, MD;  Location: [facility];  Service: General;  Laterality: Right;    FAMILY HISTORY: Family History  Problem Relation Age of Onset   Hypertension Mother    Breast cancer Mother 63   COPD Father    Atrial fibrillation Father    Atrial fibrillation Sister    Kidney cancer Maternal Aunt        dx 46s   Leukemia Paternal Aunt        dx after 51   Cancer Paternal Aunt        unknown type; dx after 73   Brain cancer Paternal Grandfather 73       brain tumor per pt   Melanoma Cousin        arm; no mets per pt   Alcohol abuse Other    Depression Other    Glaucoma Other    Breast cancer Other        MGM's sisters, x3, dx after 69   Colon polyps Neg Hx    Her father died at age 32 from COPD. Her mother is currently 34 years old as of 11/2020. Robin Farley has no full siblings and three half-siblings-- two brothers and one sister. She reports breast cancer in her mother and three great-aunts, brain tumor in her paternal  grandfather, and metastatic melanoma in a cousin.   GYNECOLOGIC HISTORY:  Patient's last menstrual period was 04/27/2016. Menarche: [age] years old Age at first live birth: 55 years old [place] P 2 LMP 2016 Contraceptive used for 1-2 years HRT estrogen cream irregularly Hysterectomy? no BSO? 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 She lives in [place], [place] but travels here frequently for work. Husband [name] works in Press photographer for [employer]. She lives at home with [name] and their daughter [name], who is 57 and is a Ship broker at [employer]. Daughter [name], age 59, is a Secretary/administrator in [place]. [place], [place]. Devanshi is not a Designer, fashion/clothing.    ADVANCED DIRECTIVES: In the absence of any documentation to the contrary, the patient's spouse is their HCPOA.    HEALTH MAINTENANCE: Social History   Tobacco Use   Smoking status: Never   Smokeless tobacco: Never  Substance Use Topics   Alcohol use: Yes    Comment: Very rare  occasions   Drug use: Never     Colonoscopy: 12/2014 (Dr. Henrene Pastor)  PAP: 2020  Bone density: unsure   Allergies  Allergen Reactions   Promethazine Hcl     Legs twitch    Current Outpatient Medications  Medication Sig Dispense Refill   valACYclovir (VALTREX) 500 MG tablet Take 1 tablet (500 mg total) by mouth daily. 60 tablet 1   atorvastatin (LIPITOR) 20 MG tablet Take 1 tablet (20 mg total) by mouth daily. 90 tablet 3   Cholecalciferol (THERA-D 2000) 50 MCG (2000 UT) TABS 1 tab by mouth once daily 30 tablet 99   dexamethasone (DECADRON) 4 MG tablet Take 2 tablets once a day for 3 days after carboplatin and AC chemotherapy. Take with food. 30 tablet 1   Emollient (ROC RETINOL CORREXION EYE) CREA Use as directed daily 15 mL 11   HYDROcodone-acetaminophen (NORCO/VICODIN) 5-325 MG tablet Take 1 tablet by mouth every 6 (six) hours as needed for moderate pain. 15 tablet 0    lidocaine-prilocaine (EMLA) cream Apply to affected area once 30 g 3   metFORMIN (GLUCOPHAGE-XR) 500 MG 24 hr tablet Take 1 tablet (500 mg total) by mouth daily with breakfast. 90 tablet 3   prochlorperazine (COMPAZINE) 10 MG tablet Take 1 tablet (10 mg total) by mouth every 6 (six) hours as needed (Nausea or vomiting). 30 tablet 1   zolpidem (AMBIEN) 5 MG tablet Take 1 tablet (5 mg total) by mouth at bedtime as needed for sleep. 20 tablet 0   No current facility-administered medications for this visit.    OBJECTIVE: White woman who appears younger than stated age  76:   12/23/20 0914  BP: (!) 127/56  Pulse: 79  Resp: 18  Temp: (!) 97.2 F (36.2 C)  SpO2: 99%     Body mass index is 25.58 kg/m.   Wt Readings from Last 3 Encounters:  12/23/20 158 lb 8 oz (71.9 kg)  12/17/20 160 lb (72.6 kg)  12/10/20 157 lb 8 oz (71.4 kg)     ECOG FS:1 - Symptomatic but completely ambulatory  Sclerae unicteric, EOMs intact Wearing a mask No cervical or supraclavicular adenopathy Lungs no rales or rhonchi Heart regular rate and rhythm Abd soft, nontender, positive bowel sounds MSK no focal spinal tenderness, no upper extremity lymphedema Neuro: nonfocal, well oriented, appropriate affect Breasts: I do not palpate a mass in her right axilla.  The area around the nipple is mildly ecchymotic.   LAB RESULTS:  CMP     Component Value Date/Time   NA 141 12/23/2020 0853   K 3.8 12/23/2020 0853   CL 108 12/23/2020 0853   CO2 25 12/23/2020 0853   GLUCOSE 107 (H) 12/23/2020 0853   BUN 13 12/23/2020 0853   CREATININE 0.81 12/23/2020 0853   CALCIUM 9.7 12/23/2020 0853   PROT 7.0 12/23/2020 0853   ALBUMIN 3.7 12/23/2020 0853   AST 19 12/23/2020 0853   ALT 81 (H) 12/23/2020 0853   ALKPHOS 157 (H) 12/23/2020 0853   BILITOT 0.3 12/23/2020 0853   GFRNONAA >60 12/23/2020 0853   GFRAA 100 03/23/2008 1212    No results found for: TOTALPROTELP, ALBUMINELP, A1GS, A2GS, BETS, BETA2SER, GAMS,  MSPIKE, SPEI  Lab Results  Component Value Date   WBC 5.4 12/23/2020   NEUTROABS PENDING 12/23/2020   HGB 12.3 12/23/2020   HCT 36.8 12/23/2020   MCV 91.8 12/23/2020   PLT 182 12/23/2020    No results found for: LABCA2  No components found for:  BCWUGQ916  No results for input(s): INR in the last 168 hours.  No results found for: LABCA2  No results found for: XIH038  No results found for: UEK800  No results found for: LKJ179  No results found for: CA2729  No components found for: HGQUANT  No results found for: CEA1 / No results found for: CEA1   No results found for: AFPTUMOR  No results found for: CHROMOGRNA  No results found for: KPAFRELGTCHN, LAMBDASER, KAPLAMBRATIO (kappa/lambda light chains)  No results found for: HGBA, HGBA2QUANT, HGBFQUANT, HGBSQUAN (Hemoglobinopathy evaluation)   No results found for: LDH  Lab Results  Component Value Date   IRON 93 12/16/2018   IRONPCTSAT 25.6 12/16/2018   (Iron and TIBC)  No results found for: FERRITIN  Urinalysis    Component Value Date/Time   COLORURINE YELLOW 12/20/2019 1521   APPEARANCEUR CLEAR 12/20/2019 1521   LABSPEC 1.017 12/20/2019 1521   PHURINE 6.5 12/20/2019 1521   GLUCOSEU NEGATIVE 12/20/2019 1521   GLUCOSEU NEGATIVE 12/16/2018 1512   HGBUR NEGATIVE 12/20/2019 1521   BILIRUBINUR NEGATIVE 12/16/2018 1512   KETONESUR NEGATIVE 12/20/2019 1521   PROTEINUR NEGATIVE 12/20/2019 1521   UROBILINOGEN 0.2 12/16/2018 1512   NITRITE NEGATIVE 12/20/2019 1521   LEUKOCYTESUR NEGATIVE 12/20/2019 1521    STUDIES: MR BREAST BILATERAL W WO CONTRAST INC CAD  Result Date: 11/26/2020 CLINICAL DATA:  55 year old female with newly diagnosed invasive right breast carcinoma and positive axillary node. LABS:  None. EXAM: BILATERAL BREAST MRI WITH AND WITHOUT CONTRAST TECHNIQUE: Multiplanar, multisequence MR images of both breasts were obtained prior to and following the intravenous administration of 7 ml of Gadavist  Three-dimensional MR images were rendered by post-processing of the original MR data on an independent workstation. The three-dimensional MR images were interpreted, and findings are reported in the following complete MRI report for this study. Three dimensional images were evaluated at the independent interpreting workstation using the DynaCAD thin client. COMPARISON:  None. FINDINGS: Breast composition: b. Scattered fibroglandular tissue. Background parenchymal enhancement: Mild Right breast: There is susceptibility artifact in the upper outer right breast consistent with a biopsy marking clip associated with this is an irregular spiculated enhancing mass measuring 2.4 x 1.3 x 2.1 cm (series 7, image 65). There is overlying tethering to the skin. No abnormal skin thickening or enhancement. There are additional suspicious foci and non mass enhancement extending anteriorly from the mass towards the nipple. The linear non mass enhancement extending to the base of the nipple spans approximately 1.8 cm (series 11, image 70). This is located approximately 5.2 cm anterior to the dominant mass. There is an additional focus of enhancement in the upper inner anterior aspect of the breast (series 11, image 61) measuring 0.6 cm. There is subtle clumped non mass enhancement in the lower outer right breast with susceptibility artifact at the posterior aspect, representing the site of recently biopsied fibrocystic changes. Left breast: No mass or abnormal enhancement. Lymph nodes: There is a small irregular lymph node with associated susceptibility artifact in the room low right axilla (series 11, image 51), consistent with biopsy proven metastatic node. There is 1 additional abnormal lymph node with cortical thickness measuring 0.6 cm (series 2, image 11). There is also a mildly asymmetric suspicious intramammary lymph node measuring 0.7 cm (series 6, image 77). No left axillary adenopathy. Ancillary findings:  None.  IMPRESSION: 1. Biopsy-proven malignancy in the upper outer right breast measuring 2.4 x 1.3 x 2.1 cm. There is associated skin tethering. There are additional  suspicious foci and linear non mass enhancement in the outer right breast extending anteriorly from the mass towards the nipple. The linear non mass enhancement extending to the base of the nipple spans approximately 1.8 cm. This is located approximately 5.2 cm anterior to the dominant mass. 2. Additional focus of enhancement in the upper inner anterior aspect of the right breast measuring 0.6 cm. 3. Biopsy-proven metastatic lymph node in the low right axilla with 1 additional suspicious lymph node. There is also an asymmetric, suspicious intramammary lymph node at the level of the biopsy improved malignancy in the upper outer quadrant. 4. No MRI evidence of malignancy in the left breast. RECOMMENDATION: If breast conservation is being considered recommend MRI guided biopsy x 2 of the retroareolar linear non mass enhancement and of the focus of enhancement in the upper inner right breast. BI-RADS CATEGORY  4: Suspicious. Electronically Signed   By: Audie Pinto M.D.   On: 11/26/2020 14:43  DG CHEST PORT 1 VIEW  Result Date: 12/02/2020 CLINICAL DATA:  Status post Port-A-Cath insertion EXAM: PORTABLE CHEST 1 VIEW COMPARISON:  None. FINDINGS: There is a right chest wall port in place with the tip in the mid SVC. The cardiomediastinal silhouette is normal. The lungs are clear, with no focal consolidation or pulmonary edema. There is no pleural effusion or pneumothorax The bones are unremarkable. IMPRESSION: Right chest wall port tip is in the mid SVC. Electronically Signed   By: Valetta Mole M.D.   On: 12/02/2020 14:41   DG Fluoro Guide CV Line-No Report  Result Date: 12/02/2020 Fluoroscopy was utilized by the requesting physician.  No radiographic interpretation.   ECHOCARDIOGRAM COMPLETE  Result Date: 11/26/2020    ECHOCARDIOGRAM REPORT    Patient Name:   Robin Farley Date of Exam: 11/26/2020 Medical Rec #:  [id number]       Height:       66.0 in Accession #:    [id number]      Weight:       154.0 lb Date of Birth:  Aug 11, 1965       BSA:          1.790 m Patient Age:    54 years        BP:           133/82 mmHg Patient Gender: F               HR:           71 bpm. Exam Location:  Outpatient Procedure: 2D Echo, 3D Echo, Cardiac Doppler, Color Doppler and Strain Analysis Indications:    Z51.11 Encounter for antineoplastic chemotheraphy  History:        Patient has no prior history of Echocardiogram examinations.                 Risk Factors:Diabetes and Dyslipidemia. Cancer.  Sonographer:    Tiffany Dance RVT Referring Phys: [id number] Winta Barcelo C Boysie Bonebrake IMPRESSIONS  1. Left ventricular ejection fraction by 3D volume is 64 %. The left ventricle has normal function. The left ventricle has no regional wall motion abnormalities. Left ventricular diastolic parameters were normal.  2. Right ventricular systolic function is normal. The right ventricular size is normal.  3. The mitral valve is normal in structure. Trivial mitral valve regurgitation. No evidence of mitral stenosis.  4. The aortic valve is normal in structure. Aortic valve regurgitation is not visualized. No aortic stenosis is present. FINDINGS  Left Ventricle: Left ventricular ejection fraction by  3D volume is 64 %. The left ventricle has normal function. The left ventricle has no regional wall motion abnormalities. The left ventricular internal cavity size was normal in size. There is no left  ventricular hypertrophy. Left ventricular diastolic parameters were normal. Right Ventricle: The right ventricular size is normal. Right vetricular wall thickness was not well visualized. Right ventricular systolic function is normal. Left Atrium: Left atrial size was normal in size. Right Atrium: Right atrial size was normal in size. Pericardium: There is no evidence of pericardial effusion. Mitral Valve: The  mitral valve is normal in structure. Trivial mitral valve regurgitation. No evidence of mitral valve stenosis. Tricuspid Valve: The tricuspid valve is grossly normal. Tricuspid valve regurgitation is not demonstrated. Aortic Valve: The aortic valve is normal in structure. Aortic valve regurgitation is not visualized. No aortic stenosis is present. Pulmonic Valve: The pulmonic valve was normal in structure. Pulmonic valve regurgitation is not visualized. Aorta: The aortic root and ascending aorta are structurally normal, with no evidence of dilitation. IAS/Shunts: The atrial septum is grossly normal.  LEFT VENTRICLE PLAX 2D LVIDd:         4.60 cm         Diastology LVIDs:         2.90 cm         LV e' medial:    5.98 cm/s LV PW:         1.00 cm         LV E/e' medial:  9.1 LV IVS:        1.00 cm         LV e' lateral:   9.03 cm/s LVOT diam:     2.10 cm         LV E/e' lateral: 6.1 LV SV:         60 LV SV Index:   34              2D LVOT Area:     3.46 cm        Longitudinal                                Strain                                2D Strain GLS  -22.4 %                                Avg:                                 3D Volume EF                                LV 3D EF:    Left                                             ventricul  ar                                             ejection                                             fraction                                             by 3D                                             volume is                                             64 %.                                 3D Volume EF:                                3D EF:        64 %                                LV EDV:       95 ml                                LV ESV:       34 ml                                LV SV:        61 ml RIGHT VENTRICLE            IVC RV Basal diam:  2.70 cm    IVC diam: 1.60 cm RV S prime:     8.70 cm/s TAPSE (M-mode): 1.7 cm  LEFT ATRIUM             Index       RIGHT ATRIUM           Index LA diam:        3.40 cm 1.90 cm/m  RA Area:     12.90 cm LA Vol (A2C):   41.6 ml 23.25 ml/m RA Volume:   28.10 ml  15.70 ml/m LA Vol (A4C):   38.0 ml 21.23 ml/m LA Biplane Vol: 40.5 ml 22.63 ml/m  AORTIC VALVE             PULMONIC VALVE LVOT Vmax:   85.10 cm/s  PR End Diast Vel: 2.23 msec LVOT Vmean:  54.800 cm/s LVOT VTI:  0.174 m  AORTA Ao Root diam: 4.00 cm Ao Asc diam:  3.40 cm MITRAL VALVE MV Area (PHT): 3.19 cm    SHUNTS MV Decel Time: 238 msec    Systemic VTI:  0.17 m MV E velocity: 54.70 cm/s  Systemic Diam: 2.10 cm MV A velocity: 59.60 cm/s MV E/A ratio:  0.92 Mertie Moores MD Electronically signed by Mertie Moores MD Signature Date/Time: 11/26/2020/5:22:55 PM    Final    MM CLIP PLACEMENT RIGHT  Result Date: 12/09/2020 CLINICAL DATA:  Status post MRI guided biopsy of the right breast. EXAM: 3D DIAGNOSTIC RIGHT MAMMOGRAM POST STEREOTACTIC BIOPSY COMPARISON:  Previous exam(s). FINDINGS: 3D Mammographic images were obtained following MRI guided biopsy of the right breast x2. Both post biopsy marking clips are in the expected locations. IMPRESSION: Appropriate positioning of the cylinder and barbell shaped biopsy marking clips in the subareolar right breast. Final Assessment: Post Procedure Mammograms for Marker Placement Electronically Signed   By: Kristopher Oppenheim M.D.   On: 12/09/2020 10:04  MR RT BREAST BX W LOC DEV 1ST LESION IMAGE BX SPEC MR GUIDE  Addendum Date: 12/11/2020   ADDENDUM REPORT: 12/11/2020 11:02 ADDENDUM: Pathology revealed GRADE II INVASIVE DUCTAL CARCINOMA of the Right breast, retroareolar (cylinder clip). This was found to be concordant by Dr. Kristopher Oppenheim. Pathology revealed TUBULAR ADENOMA of the Right breast, retroareolar (barbell clip). This was found to be concordant by Dr. Kristopher Oppenheim. Pathology results were discussed with the patient by telephone. The patient reported doing well after the biopsies  with tenderness at the sites. Post biopsy instructions and care were reviewed and questions were answered. The patient was encouraged to call [facility] for any additional concerns. My direct phone number was provided. The patient has a recent diagnosis of Right breast cancer and should follow her outlined treatment plan. Bary Castilla, RN Oncology Navigator with [facility] was notified of biopsy results via EPIC message on December 10, 2020. Pathology results reported by Terie Purser, RN on 12/11/2020. Electronically Signed   By: Kristopher Oppenheim M.D.   On: 12/11/2020 11:02   Result Date: 12/11/2020 CLINICAL DATA:  55 year old female with newly diagnosed right breast cancer in 2 additional sites of indeterminate enhancement in the subareolar right breast. EXAM: MRI GUIDED CORE NEEDLE BIOPSY OF THE RIGHT BREAST x2 TECHNIQUE: Multiplanar, multisequence MR imaging of the right breast was performed both before and after administration of intravenous contrast. CONTRAST:  78m GADAVIST GADOBUTROL 1 MMOL/ML IV SOLN COMPARISON:  Previous exams. FINDINGS: I met with the patient, and we discussed the procedure of MRI guided biopsy, including risks, benefits, and alternatives. Specifically, we discussed the risks of infection, bleeding, tissue injury, clip migration, and inadequate sampling. Informed, written consent was given. The usual time out protocol was performed immediately prior to the procedure. Using sterile technique, 1% Lidocaine, MRI guidance, and a 9 gauge vacuum assisted device, biopsy was performed of of the subareolar right breast using a lateral approach. At the conclusion of the procedure, a cylinder shaped tissue marker clip was deployed into the biopsy cavity. Using sterile technique, 1% Lidocaine, MRI guidance, and a 9 gauge vacuum assisted device, biopsy was performed of the subareolar right breast using a lateral approach. At the conclusion of the procedure, a  barbell shaped tissue marker clip was deployed into the biopsy cavity. Follow-up 2-view mammogram was performed and dictated separately. IMPRESSION: MRI guided biopsy of the right breast x2. No apparent complications. Electronically Signed: By:  Kristopher Oppenheim M.D. On: 12/09/2020 10:03  MR RT BREAST BX W LOC DEV EA ADD LESION IMAGE BX SPEC MR GUIDE  Addendum Date: 12/11/2020   ADDENDUM REPORT: 12/11/2020 11:02 ADDENDUM: Pathology revealed GRADE II INVASIVE DUCTAL CARCINOMA of the Right breast, retroareolar (cylinder clip). This was found to be concordant by Dr. Kristopher Oppenheim. Pathology revealed TUBULAR ADENOMA of the Right breast, retroareolar (barbell clip). This was found to be concordant by Dr. Kristopher Oppenheim. Pathology results were discussed with the patient by telephone. The patient reported doing well after the biopsies with tenderness at the sites. Post biopsy instructions and care were reviewed and questions were answered. The patient was encouraged to call [facility] for any additional concerns. My direct phone number was provided. The patient has a recent diagnosis of Right breast cancer and should follow her outlined treatment plan. Bary Castilla, RN Oncology Navigator with [facility] was notified of biopsy results via EPIC message on December 10, 2020. Pathology results reported by Terie Purser, RN on 12/11/2020. Electronically Signed   By: Kristopher Oppenheim M.D.   On: 12/11/2020 11:02   Result Date: 12/11/2020 CLINICAL DATA:  55 year old female with newly diagnosed right breast cancer in 2 additional sites of indeterminate enhancement in the subareolar right breast. EXAM: MRI GUIDED CORE NEEDLE BIOPSY OF THE RIGHT BREAST x2 TECHNIQUE: Multiplanar, multisequence MR imaging of the right breast was performed both before and after administration of intravenous contrast. CONTRAST:  56m GADAVIST GADOBUTROL 1 MMOL/ML IV SOLN COMPARISON:  Previous exams. FINDINGS: I met  with the patient, and we discussed the procedure of MRI guided biopsy, including risks, benefits, and alternatives. Specifically, we discussed the risks of infection, bleeding, tissue injury, clip migration, and inadequate sampling. Informed, written consent was given. The usual time out protocol was performed immediately prior to the procedure. Using sterile technique, 1% Lidocaine, MRI guidance, and a 9 gauge vacuum assisted device, biopsy was performed of of the subareolar right breast using a lateral approach. At the conclusion of the procedure, a cylinder shaped tissue marker clip was deployed into the biopsy cavity. Using sterile technique, 1% Lidocaine, MRI guidance, and a 9 gauge vacuum assisted device, biopsy was performed of the subareolar right breast using a lateral approach. At the conclusion of the procedure, a barbell shaped tissue marker clip was deployed into the biopsy cavity. Follow-up 2-view mammogram was performed and dictated separately. IMPRESSION: MRI guided biopsy of the right breast x2. No apparent complications. Electronically Signed: By: SKristopher OppenheimM.D. On: 12/09/2020 10:03    ELIGIBLE FOR AVAILABLE RESEARCH PROTOCOL: no  ASSESSMENT: 55y.o. [place] woman status post right breast upper outer quadrant biopsy 11/08/2020 for a clinical T1c N1, stage IIB invasive ductal carcinoma, grade 3, functionally triple negative, with an MIB-1 of 30%  (A) right breast retroareolar biopsy 12/09/2020 shows invasive ductal carcinoma, grade 2  (1) genetics test 12/03/2020 through the [facility] found no deleterious mutations in AIP, ALK, APC, ATM, AXIN2, BAP1, BARD1, BLM, BMPR1A, BRCA1, BRCA2, BRIP1, CDC73, CDH1, CDK4, CDKN1B, CDKN2A, CHEK2, CTNNA1, DICER1, FANCC, FH, FLCN, GALNT12, KIF1B, LZTR1, MAX, MEN1, MET, MLH1, MSH2, MSH3, MSH6, MUTYH, NBN, NF1, NF2, NTHL1, PALB2, PHOX2B, PMS2, POT1, PRKAR1A, PTCH1, PTEN, RAD51C, RAD51D, RB1, RECQL, RET, SDHA, SDHAF2,  SDHB, SDHC, SDHD, SMAD4, SMARCA4, SMARCB1, SMARCE1, STK11, SUFU, TMEM127, TP53, TSC1, TSC2, VHL and XRCC2 (sequencing and deletion/duplication); EGFR, EGLN1, HOXB13, KIT, MITF, PDGFRA, POLD1, and POLE (sequencing only); EPCAM and GREM1 (deletion/duplication  only).    (2) neoadjuvant chemotherapy to consist of carboplatin and paclitaxel with pembrolizumab every 21 days x 4 starting 12/03/2020, to be followed by doxorubicin and cyclophosphamide with pembrolizumab every 21 days x 4  (3) definitive surgery to follow  (4) adjuvant radiation   PLAN: Sarinah tolerated her first cycle of chemotherapy generally well.  We did have to hold the day 15 Taxol because of concerns regarding her liver function tests.  We will need to follow this closely.  This could be really more related to fatty liver than 2 the treatment itself.  I have added valacyclovir to her medications to prevent further problems with mouth sores.  She has a complex living situation and it might be better if he was just she and her husband in the house.  They are trying to work through that problem right now.  Otherwise she will return in 1 week for her day 8 treatment.  She knows to call for any other issues that may develop before then  Total encounter time 25 minutes.Sarajane Jews C. Ariadna Setter, MD 12/23/2020 9:43 AM Medical Oncology and Hematology [facility] [place], [place] [address] Tel. [phone]    Fax. [phone]   This document serves as a record of services personally performed by Lurline Del, MD. It was created on his behalf by Wilburn Mylar, a trained medical scribe. The creation of this record is based on the scribe's personal observations and the provider's statements to them.   I, Lurline Del MD, have reviewed the above documentation for accuracy and completeness, and I agree with the above.   *Total Encounter Time as defined by the Centers for Medicare and Medicaid  Services includes, in addition to the face-to-face time of a patient visit (documented in the note above) non-face-to-face time: obtaining and reviewing outside history, ordering and reviewing medications, tests or procedures, care coordination (communications with other health care professionals or caregivers) and documentation in the medical record.

## 2020-12-23 ENCOUNTER — Inpatient Hospital Stay: Payer: Managed Care, Other (non HMO)

## 2020-12-23 ENCOUNTER — Other Ambulatory Visit: Payer: Self-pay

## 2020-12-23 ENCOUNTER — Inpatient Hospital Stay: Payer: Managed Care, Other (non HMO) | Admitting: Oncology

## 2020-12-23 VITALS — BP 127/56 | HR 79 | Temp 97.2°F | Resp 18 | Wt 158.5 lb

## 2020-12-23 DIAGNOSIS — C50411 Malignant neoplasm of upper-outer quadrant of right female breast: Secondary | ICD-10-CM

## 2020-12-23 DIAGNOSIS — Z5112 Encounter for antineoplastic immunotherapy: Secondary | ICD-10-CM | POA: Diagnosis not present

## 2020-12-23 DIAGNOSIS — Z171 Estrogen receptor negative status [ER-]: Secondary | ICD-10-CM

## 2020-12-23 DIAGNOSIS — Z95828 Presence of other vascular implants and grafts: Secondary | ICD-10-CM

## 2020-12-23 LAB — CBC WITH DIFFERENTIAL (CANCER CENTER ONLY)
Abs Immature Granulocytes: 0.25 10*3/uL — ABNORMAL HIGH (ref 0.00–0.07)
Basophils Absolute: 0.1 10*3/uL (ref 0.0–0.1)
Basophils Relative: 1 %
Eosinophils Absolute: 0.1 10*3/uL (ref 0.0–0.5)
Eosinophils Relative: 1 %
HCT: 36.8 % (ref 36.0–46.0)
Hemoglobin: 12.3 g/dL (ref 12.0–15.0)
Immature Granulocytes: 5 %
Lymphocytes Relative: 26 %
Lymphs Abs: 1.4 10*3/uL (ref 0.7–4.0)
MCH: 30.7 pg (ref 26.0–34.0)
MCHC: 33.4 g/dL (ref 30.0–36.0)
MCV: 91.8 fL (ref 80.0–100.0)
Monocytes Absolute: 0.6 10*3/uL (ref 0.1–1.0)
Monocytes Relative: 11 %
Neutro Abs: 3.1 10*3/uL (ref 1.7–7.7)
Neutrophils Relative %: 56 %
Platelet Count: 182 10*3/uL (ref 150–400)
RBC: 4.01 MIL/uL (ref 3.87–5.11)
RDW: 12.5 % (ref 11.5–15.5)
WBC Count: 5.4 10*3/uL (ref 4.0–10.5)
nRBC: 0 % (ref 0.0–0.2)

## 2020-12-23 LAB — CMP (CANCER CENTER ONLY)
ALT: 81 U/L — ABNORMAL HIGH (ref 0–44)
AST: 19 U/L (ref 15–41)
Albumin: 3.7 g/dL (ref 3.5–5.0)
Alkaline Phosphatase: 157 U/L — ABNORMAL HIGH (ref 38–126)
Anion gap: 8 (ref 5–15)
BUN: 13 mg/dL (ref 6–20)
CO2: 25 mmol/L (ref 22–32)
Calcium: 9.7 mg/dL (ref 8.9–10.3)
Chloride: 108 mmol/L (ref 98–111)
Creatinine: 0.81 mg/dL (ref 0.44–1.00)
GFR, Estimated: >60 mL/min (ref >60)
Glucose, Bld: 107 mg/dL — ABNORMAL HIGH (ref 70–99)
Potassium: 3.8 mmol/L (ref 3.5–5.1)
Sodium: 141 mmol/L (ref 135–145)
Total Bilirubin: 0.3 mg/dL (ref 0.3–1.2)
Total Protein: 7 g/dL (ref 6.5–8.1)

## 2020-12-23 MED ORDER — FAMOTIDINE 20 MG IN NS 100 ML IVPB
20.0000 mg | Freq: Once | INTRAVENOUS | Status: AC
Start: 2020-12-23 — End: 2020-12-23
  Administered 2020-12-23: 20 mg via INTRAVENOUS
  Filled 2020-12-23: qty 100

## 2020-12-23 MED ORDER — SODIUM CHLORIDE 0.9 % IV SOLN
150.0000 mg | Freq: Once | INTRAVENOUS | Status: AC
  Administered 2020-12-23: 150 mg via INTRAVENOUS
  Filled 2020-12-23: qty 150

## 2020-12-23 MED ORDER — HEPARIN SOD (PORK) LOCK FLUSH 100 UNIT/ML IV SOLN
500.0000 [IU] | Freq: Once | INTRAVENOUS | Status: AC | PRN
  Administered 2020-12-23: 500 [IU]

## 2020-12-23 MED ORDER — PALONOSETRON HCL INJECTION 0.25 MG/5ML
0.2500 mg | Freq: Once | INTRAVENOUS | Status: AC
  Administered 2020-12-23: 0.25 mg via INTRAVENOUS
  Filled 2020-12-23: qty 5

## 2020-12-23 MED ORDER — SODIUM CHLORIDE 0.9% FLUSH
10.0000 mL | INTRAVENOUS | Status: DC | PRN
  Administered 2020-12-23: 10 mL

## 2020-12-23 MED ORDER — SODIUM CHLORIDE 0.9 % IV SOLN
10.0000 mg | Freq: Once | INTRAVENOUS | Status: AC
  Administered 2020-12-23: 10 mg via INTRAVENOUS
  Filled 2020-12-23: qty 10

## 2020-12-23 MED ORDER — DIPHENHYDRAMINE HCL 50 MG/ML IJ SOLN
50.0000 mg | Freq: Once | INTRAMUSCULAR | Status: AC
  Administered 2020-12-23: 50 mg via INTRAVENOUS
  Filled 2020-12-23: qty 1

## 2020-12-23 MED ORDER — SODIUM CHLORIDE 0.9 % IV SOLN
548.5000 mg | Freq: Once | INTRAVENOUS | Status: AC
  Administered 2020-12-23: 550 mg via INTRAVENOUS
  Filled 2020-12-23: qty 55

## 2020-12-23 MED ORDER — SODIUM CHLORIDE 0.9 % IV SOLN: Freq: Once | INTRAVENOUS | Status: AC

## 2020-12-23 MED ORDER — VALACYCLOVIR HCL 500 MG PO TABS
500.0000 mg | ORAL_TABLET | Freq: Every day | ORAL | 1 refills | Status: DC
Start: 2020-12-23 — End: 2021-01-07

## 2020-12-23 MED ORDER — SODIUM CHLORIDE 0.9 % IV SOLN
65.0000 mg/m2 | Freq: Once | INTRAVENOUS | Status: AC
  Administered 2020-12-23: 120 mg via INTRAVENOUS
  Filled 2020-12-23: qty 20

## 2020-12-23 MED ORDER — SODIUM CHLORIDE 0.9 % IV SOLN: 80.0000 mg/m2 | Freq: Once | INTRAVENOUS | Status: DC

## 2020-12-23 MED ORDER — SODIUM CHLORIDE 0.9 % IV SOLN
200.0000 mg | Freq: Once | INTRAVENOUS | Status: AC
  Administered 2020-12-23: 200 mg via INTRAVENOUS
  Filled 2020-12-23: qty 8

## 2020-12-23 NOTE — Patient Instructions (Signed)
[facility]ICAL [facility]COL[facility]  Discharge Instructions: Thank you for choosing [facility] to provide your oncology and hematology care.   If you have a lab appointment with the Cancer Center, please go directly to the Cancer Center and check in at the registration area.   Wear comfortable clothing and clothing appropriate for easy access to any Portacath or PICC line.   We strive to give you quality time with your provider. You may need to reschedule your appointment if you arrive late (15 or more minutes).  Arriving late affects you and other patients whose appointments are after yours.  Also, if you miss three or more appointments without notifying the office, you may be dismissed from the clinic at the provider's discretion.      For prescription refill requests, have your pharmacy contact our office and allow 72 hours for refills to be completed.    Today you received the following chemotherapy and/or immunotherapy agents: Keytruda, Taxol, Carboplatin     To help prevent nausea and vomiting after your treatment, we encourage you to take your nausea medication as directed.  BELOW ARE SYMPTOMS THAT SHOULD BE REPORTED IMMEDIATELY: . *FEVER GREATER THAN 100.4 F (38 C) OR HIGHER . *CHILLS OR SWEATING . *NAUSEA AND VOMITING THAT IS NOT CONTROLLED WITH YOUR NAUSEA MEDICATION . *UNUSUAL SHORTNESS OF BREATH . *UNUSUAL BRUISING OR BLEEDING . *URINARY PROBLEMS (pain or burning when urinating, or frequent urination) . *BOWEL PROBLEMS (unusual diarrhea, constipation, pain near the anus) . TENDERNESS IN MOUTH AND THROAT WITH OR WITHOUT PRESENCE OF ULCERS (sore throat, sores in mouth, or a toothache) . UNUSUAL RASH, SWELLING OR PAIN  . UNUSUAL VAGINAL DISCHARGE OR ITCHING   Items with * indicate a potential emergency and should be followed up as soon as possible or go to the Emergency Department if any problems should occur.  Please show the CHEMOTHERAPY ALERT CARD or  IMMUNOTHERAPY ALERT CARD at check-in to the Emergency Department and triage nurse.  Should you have questions after your visit or need to cancel or reschedule your appointment, please contact [facility]  Dept: [phone]  and follow the prompts.  Office hours are 8:00 a.m. to 4:30 p.m. Monday - Friday. Please note that voicemails left after 4:00 p.m. may not be returned until the following business day.  We are closed weekends and major holidays. You have access to a nurse at all times for urgent questions. Please call the main number to the clinic Dept: [phone] and follow the prompts.   For any non-urgent questions, you may also contact your provider using MyChart. We now offer e-Visits for anyone 18 and older to request care online for non-urgent symptoms. For details visit [email or web address].   Also download the MyChart app! Go to the app store, search "MyChart", open the app, select [facility], and log in with your MyChart username and password.  Due to Covid, a mask is required upon entering the hospital/clinic. If you do not have a mask, one will be given to you upon arrival. For doctor visits, patients may have 1 support person aged 18 or older with them. For treatment visits, patients cannot have anyone with them due to current Covid guidelines and our immunocompromised population.   

## 2020-12-23 NOTE — Progress Notes (Signed)
Per Dr. Jana Hakim, Taxol doses should be decreased to 65 mg/m2 for today and all future doses.  Kennith Center, Pharm.D., CPP 12/23/2020'@10'$ :29 AM

## 2020-12-23 NOTE — Progress Notes (Signed)
Okay to treat with ALT of 81 per Dr. Jana Hakim

## 2020-12-24 ENCOUNTER — Other Ambulatory Visit: Payer: Self-pay | Admitting: Pharmacist

## 2020-12-30 MED FILL — Dexamethasone Sodium Phosphate Inj 100 MG/10ML: INTRAMUSCULAR | Qty: 1 | Status: AC

## 2020-12-31 ENCOUNTER — Telehealth: Payer: Self-pay | Admitting: *Deleted

## 2020-12-31 ENCOUNTER — Inpatient Hospital Stay: Payer: Managed Care, Other (non HMO)

## 2020-12-31 ENCOUNTER — Other Ambulatory Visit: Payer: Self-pay

## 2020-12-31 ENCOUNTER — Other Ambulatory Visit: Payer: Self-pay | Admitting: *Deleted

## 2020-12-31 VITALS — BP 120/83 | HR 86 | Temp 98.6°F | Resp 16

## 2020-12-31 DIAGNOSIS — Z5112 Encounter for antineoplastic immunotherapy: Secondary | ICD-10-CM | POA: Diagnosis not present

## 2020-12-31 DIAGNOSIS — C50411 Malignant neoplasm of upper-outer quadrant of right female breast: Secondary | ICD-10-CM

## 2020-12-31 DIAGNOSIS — Z95828 Presence of other vascular implants and grafts: Secondary | ICD-10-CM

## 2020-12-31 LAB — CMP (CANCER CENTER ONLY)
ALT: 34 U/L (ref 0–44)
AST: 18 U/L (ref 15–41)
Albumin: 3.6 g/dL (ref 3.5–5.0)
Alkaline Phosphatase: 102 U/L (ref 38–126)
Anion gap: 7 (ref 5–15)
BUN: 12 mg/dL (ref 6–20)
CO2: 25 mmol/L (ref 22–32)
Calcium: 9.2 mg/dL (ref 8.9–10.3)
Chloride: 109 mmol/L (ref 98–111)
Creatinine: 0.74 mg/dL (ref 0.44–1.00)
GFR, Estimated: >60 mL/min (ref >60)
Glucose, Bld: 92 mg/dL (ref 70–99)
Potassium: 4.1 mmol/L (ref 3.5–5.1)
Sodium: 141 mmol/L (ref 135–145)
Total Bilirubin: 0.3 mg/dL (ref 0.3–1.2)
Total Protein: 6.6 g/dL (ref 6.5–8.1)

## 2020-12-31 LAB — CBC WITH DIFFERENTIAL (CANCER CENTER ONLY)
Abs Immature Granulocytes: 0 10*3/uL (ref 0.00–0.07)
Basophils Absolute: 0 10*3/uL (ref 0.0–0.1)
Basophils Relative: 1 %
Eosinophils Absolute: 0.1 10*3/uL (ref 0.0–0.5)
Eosinophils Relative: 1 %
HCT: 33.7 % — ABNORMAL LOW (ref 36.0–46.0)
Hemoglobin: 11.6 g/dL — ABNORMAL LOW (ref 12.0–15.0)
Immature Granulocytes: 0 %
Lymphocytes Relative: 35 %
Lymphs Abs: 1.3 10*3/uL (ref 0.7–4.0)
MCH: 31 pg (ref 26.0–34.0)
MCHC: 34.4 g/dL (ref 30.0–36.0)
MCV: 90.1 fL (ref 80.0–100.0)
Monocytes Absolute: 0.3 10*3/uL (ref 0.1–1.0)
Monocytes Relative: 7 %
Neutro Abs: 2.2 10*3/uL (ref 1.7–7.7)
Neutrophils Relative %: 56 %
Platelet Count: 210 10*3/uL (ref 150–400)
RBC: 3.74 MIL/uL — ABNORMAL LOW (ref 3.87–5.11)
RDW: 11.6 % (ref 11.5–15.5)
WBC Count: 3.9 10*3/uL — ABNORMAL LOW (ref 4.0–10.5)
nRBC: 0 % (ref 0.0–0.2)

## 2020-12-31 MED ORDER — SODIUM CHLORIDE 0.9 % IV SOLN
65.0000 mg/m2 | Freq: Once | INTRAVENOUS | Status: AC
  Administered 2020-12-31: 120 mg via INTRAVENOUS
  Filled 2020-12-31: qty 20

## 2020-12-31 MED ORDER — SODIUM CHLORIDE 0.9% FLUSH
10.0000 mL | INTRAVENOUS | Status: DC | PRN
  Administered 2020-12-31: 10 mL

## 2020-12-31 MED ORDER — SODIUM CHLORIDE 0.9 % IV SOLN: Freq: Once | INTRAVENOUS | Status: AC

## 2020-12-31 MED ORDER — FLUCONAZOLE 100 MG PO TABS: 100.0000 mg | ORAL_TABLET | Freq: Every day | ORAL | 1 refills | Status: DC

## 2020-12-31 MED ORDER — SODIUM CHLORIDE 0.9 % IV SOLN
10.0000 mg | Freq: Once | INTRAVENOUS | Status: AC
  Administered 2020-12-31: 10 mg via INTRAVENOUS
  Filled 2020-12-31: qty 10

## 2020-12-31 MED ORDER — FAMOTIDINE 20 MG IN NS 100 ML IVPB
20.0000 mg | Freq: Once | INTRAVENOUS | Status: AC
  Administered 2020-12-31: 20 mg via INTRAVENOUS
  Filled 2020-12-31: qty 100

## 2020-12-31 MED ORDER — HEPARIN SOD (PORK) LOCK FLUSH 100 UNIT/ML IV SOLN
500.0000 [IU] | Freq: Once | INTRAVENOUS | Status: AC | PRN
  Administered 2020-12-31: 500 [IU]

## 2020-12-31 MED ORDER — DIPHENHYDRAMINE HCL 50 MG/ML IJ SOLN
50.0000 mg | Freq: Once | INTRAMUSCULAR | Status: AC
  Administered 2020-12-31: 50 mg via INTRAVENOUS
  Filled 2020-12-31: qty 1

## 2020-12-31 MED ORDER — SODIUM CHLORIDE 0.9% FLUSH
10.0000 mL | INTRAVENOUS | Status: DC | PRN
Start: 2020-12-31 — End: 2020-12-31
  Administered 2020-12-31: 10 mL

## 2020-12-31 NOTE — Telephone Encounter (Signed)
This RN spoke with pt per call to her post receiving communication from paper inbox sent 12/29/2020 from on call service.  Per on call communication- pt's husband called on 9/18 with concern for ongoing mouth sores now interfering with intake - pt was advised to " see PCP within 24 hours ".  Note communication states husband informed them pt was under chemotherapy.  Per call today- Robin Farley states her husband was advised by their pharmacy of how to mix two OTC medications to help with noted benefit.  Pt is scheduled for day 8 of cycle 2 of taxol/carbo/keytruda today.  This RN asked pt to have port accessed and then to ask to see this RN for review of above prior to getting day 8 chemotherapy.  Pt verbalized understanding and appreciation of above.

## 2020-12-31 NOTE — Patient Instructions (Signed)
[facility]COL[facility]   Discharge Instructions: Thank you for choosing [facility] to provide your oncology and hematology care.   If you have a lab appointment with the Cancer Center, please go directly to the Cancer Center and check in at the registration area.   Wear comfortable clothing and clothing appropriate for easy access to any Portacath or PICC line.   We strive to give you quality time with your provider. You may need to reschedule your appointment if you arrive late (15 or more minutes).  Arriving late affects you and other patients whose appointments are after yours.  Also, if you miss three or more appointments without notifying the office, you may be dismissed from the clinic at the provider's discretion.      For prescription refill requests, have your pharmacy contact our office and allow 72 hours for refills to be completed.    Today you received the following chemotherapy and/or immunotherapy agents: paclitaxel.      To help prevent nausea and vomiting after your treatment, we encourage you to take your nausea medication as directed.  BELOW ARE SYMPTOMS THAT SHOULD BE REPORTED IMMEDIATELY: *FEVER GREATER THAN 100.4 F (38 C) OR HIGHER *CHILLS OR SWEATING *NAUSEA AND VOMITING THAT IS NOT CONTROLLED WITH YOUR NAUSEA MEDICATION *UNUSUAL SHORTNESS OF BREATH *UNUSUAL BRUISING OR BLEEDING *URINARY PROBLEMS (pain or burning when urinating, or frequent urination) *BOWEL PROBLEMS (unusual diarrhea, constipation, pain near the anus) TENDERNESS IN MOUTH AND THROAT WITH OR WITHOUT PRESENCE OF ULCERS (sore throat, sores in mouth, or a toothache) UNUSUAL RASH, SWELLING OR PAIN  UNUSUAL VAGINAL DISCHARGE OR ITCHING   Items with * indicate a potential emergency and should be followed up as soon as possible or go to the Emergency Department if any problems should occur.  Please show the CHEMOTHERAPY ALERT CARD or IMMUNOTHERAPY ALERT CARD at check-in  to the Emergency Department and triage nurse.  Should you have questions after your visit or need to cancel or reschedule your appointment, please contact [facility]  Dept: [phone]  and follow the prompts.  Office hours are 8:00 a.m. to 4:30 p.m. Monday - Friday. Please note that voicemails left after 4:00 p.m. may not be returned until the following business day.  We are closed weekends and major holidays. You have access to a nurse at all times for urgent questions. Please call the main number to the clinic Dept: [phone] and follow the prompts.   For any non-urgent questions, you may also contact your provider using MyChart. We now offer e-Visits for anyone 18 and older to request care online for non-urgent symptoms. For details visit [email or web address].   Also download the MyChart app! Go to the app store, search "MyChart", open the app, select [facility], and log in with your MyChart username and password.  Due to Covid, a mask is required upon entering the hospital/clinic. If you do not have a mask, one will be given to you upon arrival. For doctor visits, patients may have 1 support person aged 18 or older with them. For treatment visits, patients cannot have anyone with them due to current Covid guidelines and our immunocompromised population.   

## 2021-01-06 MED FILL — Dexamethasone Sodium Phosphate Inj 100 MG/10ML: INTRAMUSCULAR | Qty: 1 | Status: AC

## 2021-01-06 NOTE — Progress Notes (Signed)
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   She began neoadjuvant chemotherapy, consisting of carboplatin, taxol, and keytruda, on 12/03/2020. Today is day 15 cycle 2.   REVIEW OF SYSTEMS: Robin Farley continues to have mouth problems.  She does say her thrush is improved on Diflucan which she has been taking for about a week.  The mouth sores have not entirely disappeared although she is on Valtrex once a week.  She is not having any neuropathy symptoms.  She is using the Digna cap and so far has not lost her hair.  Otherwise a review of systems today was stable   COVID 19 VACCINATION STATUS: [name] x2, most recently 11/2019; infection 07/2020   HISTORY OF CURRENT ILLNESS: From the original intake note:  Robin Farley presented with a new palpable lump in the right breast. She underwent bilateral diagnostic mammography with tomography and right breast ultrasonography at [facility] on 11/06/2020 showing: breast density category B; 1.9 cm mass at palpable site of concern in right breast at 10 o'clock; 0.8 cm smaller mass in right breast at 9 o'clock; 0.7 cm small mass in right axilla, possibly a replaced lymph node; second mildly suspicious lymph node in axilla.  Accordingly on 11/08/2020 she  proceeded to biopsy of the right breast area in question. The pathology from this procedure ([id number]) showed:  1. Right Breast, 10 o'clock - invasive ductal carcinoma, grade 3  - Prognostic indicators significant for: estrogen receptor, 5% "positive "with weak staining intensity (this is functionally negative) and progesterone receptor, 0% negative. Proliferation marker Ki67 at 30%. HER2 equivocal by immunohistochemistry (2+), but negative by fluorescent in situ hybridization with a signals ratio 1.19 and number per cell 1.6. 2. Right Breast, 9 o'clock  - fibrocystic change 3. Right Axilla, lymph node  - metastatic carcinoma involving a lymph node  Cancer Staging Malignant neoplasm of upper-outer quadrant of right breast in female, estrogen receptor negative ([place]) Staging form: Breast, AJCC 8th Edition - Clinical: Stage IIB (cT1c, cN1, cM0, G3, ER-, PR-, HER2-) - Signed by [name], MD on 11/20/2020 Histologic grading system: 3 grade system  The patient's subsequent history is as detailed below.   PAST MEDICAL HISTORY: Past Medical History:  Diagnosis Date   Abdominal pain, generalized 12/27/2008   Acute sinusitis, unspecified 02/26/2007   ADD 03/13/2010   ALLERGIC RHINITIS 02/23/2007   ANEMIA 02/05/2007   Anxiety 11/17/2011   ATTENTION DEFICIT DISORDER, HX OF 02/05/2007   Breast cancer ([place])    Deviated nasal septum 02/05/2007   Diabetes mellitus without complication ([place])    Family history of breast cancer 11/20/2020   Family history of melanoma 11/20/2020   FATIGUE 02/23/2007   HYPERLIPIDEMIA 02/23/2007   Personal history of malignant melanoma 11/20/2020   SWELLING MASS OR LUMP IN HEAD AND NECK 07/27/2008   UTI 02/23/2007    PAST SURGICAL HISTORY: Past  Surgical History:  Procedure Laterality Date   BROW LIFT     CESAREAN SECTION     COSMETIC SURGERY  1995   Nasal   MELANOMA EXCISION WITH SENTINEL LYMPH NODE BIOPSY Left 10/24/2012   Procedure: MELANOMA  wide EXCISION left lateral thigh WITH SENTINEL LYMPH NODE BIOPSY left groin;  Surgeon: Edward Jolly, MD;  Location: [facility];  Service: General;  Laterality: Left;   NASAL SEPTUM SURGERY     PORTACATH PLACEMENT Right 12/02/2020   Procedure: INSERTION PORT-A-CATH;  Surgeon: Donnie Mesa, MD;  Location: [facility];  Service: General;  Laterality: Right;    FAMILY HISTORY: Family History  Problem Relation Age of Onset   Hypertension Mother    Breast cancer Mother 69   COPD Father    Atrial fibrillation Father    Atrial fibrillation Sister    Kidney cancer Maternal Aunt        dx 3s   Leukemia Paternal Aunt        dx after 67   Cancer Paternal Aunt        unknown type; dx after 17   Brain cancer Paternal Grandfather 73       brain tumor per pt   Melanoma Cousin        arm; no mets per pt   Alcohol abuse Other    Depression Other    Glaucoma Other    Breast cancer Other        MGM's sisters, x3, dx after 5   Colon polyps Neg Hx    Her father died at age 68 from COPD. Her mother is currently 60 years old as of 11/2020. Robin Farley has no full siblings and three half-siblings-- two brothers and one sister. She reports breast cancer in her mother and three great-aunts, brain tumor in her paternal grandfather, and metastatic melanoma in a cousin.   GYNECOLOGIC HISTORY:  Patient's last menstrual period was 04/27/2016. Menarche: [age] years old Age at first live birth: 55 years old [place] P 2 LMP 2016 Contraceptive used for 1-2 years HRT estrogen cream irregularly Hysterectomy? no BSO? 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 She lives in [place], [place] but travels here frequently for work. Husband [name] works in Press photographer for [employer]. She lives at home with [name] and their daughter [name], who is 25 and is a Ship broker at [employer]. Daughter [name], age 56, is a Careers adviser in [place]. [place], . [name] is not a Designer, fashion/clothing.    ADVANCED DIRECTIVES: In the absence of any documentation to the contrary, the patient's spouse is their HCPOA.    HEALTH MAINTENANCE: Social History   Tobacco Use   Smoking status: Never   Smokeless tobacco: Never  Substance Use Topics   Alcohol use: Yes    Comment: Very rare occasions   Drug use: Never     Colonoscopy: 12/2014 (Dr. Henrene Pastor)  PAP: 2020  Bone density: unsure   Allergies  Allergen Reactions   Promethazine Hcl     Legs twitch    Current Outpatient Medications  Medication Sig Dispense Refill   atorvastatin (LIPITOR) 20 MG tablet Take 1 tablet (20 mg total) by mouth daily. 90 tablet 3   Cholecalciferol (THERA-D 2000) 50 MCG (2000 UT) TABS 1 tab by mouth once daily 30 tablet 99   dexamethasone (DECADRON) 4 MG tablet Take 2 tablets once a day for 3 days  after carboplatin and AC chemotherapy. Take with food. 30 tablet 1   Emollient (ROC RETINOL CORREXION EYE) CREA Use as directed daily 15 mL 11   fluconazole (DIFLUCAN) 100 MG tablet Take 1 tablet (100 mg total) by mouth daily. 60 tablet 1   lidocaine-prilocaine (EMLA) cream Apply to affected area once 30 g 3   metFORMIN (GLUCOPHAGE-XR) 500 MG 24 hr tablet Take 1 tablet (500 mg total) by mouth daily with breakfast. 90 tablet 3   prochlorperazine (COMPAZINE) 10 MG tablet Take 1 tablet (10 mg total) by mouth every 6 (six) hours as needed (Nausea or vomiting). 30 tablet 1   valACYclovir (VALTREX) 500 MG tablet Take 1 tablet (500 mg total) by mouth 2 (two) times daily. 60 tablet 1   zolpidem (AMBIEN) 5 MG tablet Take 1 tablet (5 mg total) by mouth at bedtime as needed for sleep. 20 tablet 0   No current facility-administered medications for this visit.   Facility-Administered Medications Ordered in Other Visits  Medication Dose Route Frequency Provider Last Rate Last Admin   Cold Pack 1 packet  1 packet Topical Once PRN Georgeanne Frankland, Virgie Dad, MD        dexamethasone (DECADRON) 10 mg in sodium chloride 0.9 % 50 mL IVPB  10 mg Intravenous Once Cherylynn Liszewski, Virgie Dad, MD       diphenhydrAMINE (BENADRYL) 50 MG/ML injection            famotidine (PEPCID) 20 mg IVPB            famotidine (PEPCID) IVPB 20 mg in NS 100 mL IVPB  20 mg Intravenous Once Kaide Gage, Virgie Dad, MD       heparin lock flush 100 unit/mL  500 Units Intracatheter Once PRN Krysta Bloomfield, Virgie Dad, MD       PACLitaxel (TAXOL) 120 mg in sodium chloride 0.9 % 250 mL chemo infusion (</= 51m/m2)  65 mg/m2 (Treatment Plan Recorded) Intravenous Once Othman Masur, GVirgie Dad MD       sodium chloride flush (NS) 0.9 % injection 10 mL  10 mL Intracatheter PRN Avett Reineck, GVirgie Dad MD        OBJECTIVE: White woman examined in the infusion room  For vitals associated with the 01/07/2021 visit please see the infusion flowsheet  There were no vitals filed for this visit.    There is no height or weight on file to calculate BMI.   Wt Readings from Last 3 Encounters:  01/07/21 160 lb 4 oz (72.7 kg)  12/23/20 158 lb 8 oz (71.9 kg)  12/17/20 160 lb (72.6 kg)     ECOG FS:1 - Symptomatic but completely ambulatory   LAB RESULTS:  CMP     Component Value Date/Time   NA 140 01/07/2021 1210   K 4.2 01/07/2021 1210   CL 105 01/07/2021 1210   CO2 24 01/07/2021 1210   GLUCOSE 109 (H) 01/07/2021 1210   BUN 12 01/07/2021 1210   CREATININE 0.85 01/07/2021 1210   CALCIUM 9.8 01/07/2021 1210   PROT 6.9 01/07/2021 1210   ALBUMIN 3.8 01/07/2021 1210   AST 20 01/07/2021 1210   ALT 28 01/07/2021 1210   ALKPHOS 93 01/07/2021 1210   BILITOT 0.3 01/07/2021 1210   GFRNONAA >60 01/07/2021 1210   GFRAA 100 03/23/2008 1212    No results found for: TOTALPROTELP, ALBUMINELP, A1GS, A2GS, BETS, BETA2SER, GAMS, MSPIKE, SPEI  Lab Results  Component Value Date   WBC 4.1 01/07/2021   NEUTROABS 1.8 01/07/2021   HGB 12.0 01/07/2021  HCT 33.9 (L) 01/07/2021   MCV 89.2 01/07/2021   PLT 284 01/07/2021    No results  found for: LABCA2  No components found for: WIOXBD532  No results for input(s): INR in the last 168 hours.  No results found for: LABCA2  No results found for: DJM426  No results found for: STM196  No results found for: QIW979  No results found for: CA2729  No components found for: HGQUANT  No results found for: CEA1 / No results found for: CEA1   No results found for: AFPTUMOR  No results found for: CHROMOGRNA  No results found for: KPAFRELGTCHN, LAMBDASER, KAPLAMBRATIO (kappa/lambda light chains)  No results found for: HGBA, HGBA2QUANT, HGBFQUANT, HGBSQUAN (Hemoglobinopathy evaluation)   No results found for: LDH  Lab Results  Component Value Date   IRON 93 12/16/2018   IRONPCTSAT 25.6 12/16/2018   (Iron and TIBC)  No results found for: FERRITIN  Urinalysis    Component Value Date/Time   COLORURINE YELLOW 12/20/2019 1521   APPEARANCEUR CLEAR 12/20/2019 1521   LABSPEC 1.017 12/20/2019 1521   PHURINE 6.5 12/20/2019 1521   GLUCOSEU NEGATIVE 12/20/2019 1521   GLUCOSEU NEGATIVE 12/16/2018 1512   HGBUR NEGATIVE 12/20/2019 1521   BILIRUBINUR NEGATIVE 12/16/2018 1512   KETONESUR NEGATIVE 12/20/2019 1521   PROTEINUR NEGATIVE 12/20/2019 1521   UROBILINOGEN 0.2 12/16/2018 1512   NITRITE NEGATIVE 12/20/2019 1521   LEUKOCYTESUR NEGATIVE 12/20/2019 1521    STUDIES: MM CLIP PLACEMENT RIGHT  Result Date: 12/09/2020 CLINICAL DATA:  Status post MRI guided biopsy of the right breast. EXAM: 3D DIAGNOSTIC RIGHT MAMMOGRAM POST STEREOTACTIC BIOPSY COMPARISON:  Previous exam(s). FINDINGS: 3D Mammographic images were obtained following MRI guided biopsy of the right breast x2. Both post biopsy marking clips are in the expected locations. IMPRESSION: Appropriate positioning of the cylinder and barbell shaped biopsy marking clips in the subareolar right breast. Final Assessment: Post Procedure Mammograms for Marker Placement Electronically Signed   By: Kristopher Oppenheim M.D.   On:  12/09/2020 10:04  MR RT BREAST BX W LOC DEV 1ST LESION IMAGE BX SPEC MR GUIDE  Addendum Date: 12/11/2020   ADDENDUM REPORT: 12/11/2020 11:02 ADDENDUM: Pathology revealed GRADE II INVASIVE DUCTAL CARCINOMA of the Right breast, retroareolar (cylinder clip). This was found to be concordant by Dr. Kristopher Oppenheim. Pathology revealed TUBULAR ADENOMA of the Right breast, retroareolar (barbell clip). This was found to be concordant by Dr. Kristopher Oppenheim. Pathology results were discussed with the patient by telephone. The patient reported doing well after the biopsies with tenderness at the sites. Post biopsy instructions and care were reviewed and questions were answered. The patient was encouraged to call [facility] for any additional concerns. My direct phone number was provided. The patient has a recent diagnosis of Right breast cancer and should follow her outlined treatment plan. Bary Castilla, RN Oncology Navigator with [facility] was notified of biopsy results via EPIC message on December 10, 2020. Pathology results reported by Terie Purser, RN on 12/11/2020. Electronically Signed   By: Kristopher Oppenheim M.D.   On: 12/11/2020 11:02   Result Date: 12/11/2020 CLINICAL DATA:  55 year old female with newly diagnosed right breast cancer in 2 additional sites of indeterminate enhancement in the subareolar right breast. EXAM: MRI GUIDED CORE NEEDLE BIOPSY OF THE RIGHT BREAST x2 TECHNIQUE: Multiplanar, multisequence MR imaging of the right breast was performed both before and after administration of intravenous contrast. CONTRAST:  23m GADAVIST GADOBUTROL 1 MMOL/ML IV SOLN  COMPARISON:  Previous exams. FINDINGS: I met with the patient, and we discussed the procedure of MRI guided biopsy, including risks, benefits, and alternatives. Specifically, we discussed the risks of infection, bleeding, tissue injury, clip migration, and inadequate sampling. Informed, written consent was given. The  usual time out protocol was performed immediately prior to the procedure. Using sterile technique, 1% Lidocaine, MRI guidance, and a 9 gauge vacuum assisted device, biopsy was performed of of the subareolar right breast using a lateral approach. At the conclusion of the procedure, a cylinder shaped tissue marker clip was deployed into the biopsy cavity. Using sterile technique, 1% Lidocaine, MRI guidance, and a 9 gauge vacuum assisted device, biopsy was performed of the subareolar right breast using a lateral approach. At the conclusion of the procedure, a barbell shaped tissue marker clip was deployed into the biopsy cavity. Follow-up 2-view mammogram was performed and dictated separately. IMPRESSION: MRI guided biopsy of the right breast x2. No apparent complications. Electronically Signed: By: Kristopher Oppenheim M.D. On: 12/09/2020 10:03  MR RT BREAST BX W LOC DEV EA ADD LESION IMAGE BX SPEC MR GUIDE  Addendum Date: 12/11/2020   ADDENDUM REPORT: 12/11/2020 11:02 ADDENDUM: Pathology revealed GRADE II INVASIVE DUCTAL CARCINOMA of the Right breast, retroareolar (cylinder clip). This was found to be concordant by Dr. Kristopher Oppenheim. Pathology revealed TUBULAR ADENOMA of the Right breast, retroareolar (barbell clip). This was found to be concordant by Dr. Kristopher Oppenheim. Pathology results were discussed with the patient by telephone. The patient reported doing well after the biopsies with tenderness at the sites. Post biopsy instructions and care were reviewed and questions were answered. The patient was encouraged to call [facility] for any additional concerns. My direct phone number was provided. The patient has a recent diagnosis of Right breast cancer and should follow her outlined treatment plan. Bary Castilla, RN Oncology Navigator with [facility] was notified of biopsy results via EPIC message on December 10, 2020. Pathology results reported by Terie Purser, RN on  12/11/2020. Electronically Signed   By: Kristopher Oppenheim M.D.   On: 12/11/2020 11:02   Result Date: 12/11/2020 CLINICAL DATA:  55 year old female with newly diagnosed right breast cancer in 2 additional sites of indeterminate enhancement in the subareolar right breast. EXAM: MRI GUIDED CORE NEEDLE BIOPSY OF THE RIGHT BREAST x2 TECHNIQUE: Multiplanar, multisequence MR imaging of the right breast was performed both before and after administration of intravenous contrast. CONTRAST:  57m GADAVIST GADOBUTROL 1 MMOL/ML IV SOLN COMPARISON:  Previous exams. FINDINGS: I met with the patient, and we discussed the procedure of MRI guided biopsy, including risks, benefits, and alternatives. Specifically, we discussed the risks of infection, bleeding, tissue injury, clip migration, and inadequate sampling. Informed, written consent was given. The usual time out protocol was performed immediately prior to the procedure. Using sterile technique, 1% Lidocaine, MRI guidance, and a 9 gauge vacuum assisted device, biopsy was performed of of the subareolar right breast using a lateral approach. At the conclusion of the procedure, a cylinder shaped tissue marker clip was deployed into the biopsy cavity. Using sterile technique, 1% Lidocaine, MRI guidance, and a 9 gauge vacuum assisted device, biopsy was performed of the subareolar right breast using a lateral approach. At the conclusion of the procedure, a barbell shaped tissue marker clip was deployed into the biopsy cavity. Follow-up 2-view mammogram was performed and dictated separately. IMPRESSION: MRI guided biopsy of the right breast x2. No apparent complications. Electronically Signed:  By: Kristopher Oppenheim M.D. On: 12/09/2020 10:03    ELIGIBLE FOR AVAILABLE RESEARCH PROTOCOL: no  ASSESSMENT: 55 y.o. [place] woman status post right breast upper outer quadrant biopsy 11/08/2020 for a clinical T1c N1, stage IIB invasive ductal carcinoma, grade 3, functionally triple negative,  with an MIB-1 of 30%  (A) right breast retroareolar biopsy 12/09/2020 shows invasive ductal carcinoma, grade 2  (1) genetics test 12/03/2020 through the [facility] +RNAinsight Panel found no deleterious mutations in AIP, ALK, APC, ATM, AXIN2, BAP1, BARD1, BLM, BMPR1A, BRCA1, BRCA2, BRIP1, CDC73, CDH1, CDK4, CDKN1B, CDKN2A, CHEK2, CTNNA1, DICER1, FANCC, FH, FLCN, GALNT12, KIF1B, LZTR1, MAX, MEN1, MET, MLH1, MSH2, MSH3, MSH6, MUTYH, NBN, NF1, NF2, NTHL1, PALB2, PHOX2B, PMS2, POT1, PRKAR1A, PTCH1, PTEN, RAD51C, RAD51D, RB1, RECQL, RET, SDHA, SDHAF2, SDHB, SDHC, SDHD, SMAD4, SMARCA4, SMARCB1, SMARCE1, STK11, SUFU, TMEM127, TP53, TSC1, TSC2, VHL and XRCC2 (sequencing and deletion/duplication); EGFR, EGLN1, HOXB13, KIT, MITF, PDGFRA, POLD1, and POLE (sequencing only); EPCAM and GREM1 (deletion/duplication only).    (2) neoadjuvant chemotherapy to consist of carboplatin and paclitaxel with pembrolizumab every 21 days x 4 starting 12/03/2020, to be followed by doxorubicin and cyclophosphamide with pembrolizumab every 21 days x 4  (3) definitive surgery to follow  (4) adjuvant radiation   PLAN: Satori continues to have some oral issues.  This is partly due to the steroids and partly due to her baseline diabetes.  I am increasing the Valtrex to twice daily and continuing the Diflucan until the thrush is completely resolved.  She is not having any peripheral neuropathy symptoms today which is very favorable.  For some reason her second and t additional cycles had not been scheduled.  We need to keep at least a cycle ahead otherwise she will lose her slot in the treatment area.  I corrected that for the next 2 cycles.  Total encounter time 20 minutes.Sarajane Jews C. Stephana Morell, MD 01/07/2021 1:09 PM Medical Oncology and Hematology [facility] [place], [place] [address] Tel. [phone]    Fax. [phone]   This document serves as a record of services  personally performed by Lurline Del, MD. It was created on his behalf by Wilburn Mylar, a trained medical scribe. The creation of this record is based on the scribe's personal observations and the provider's statements to them.   I, Lurline Del MD, have reviewed the above documentation for accuracy and completeness, and I agree with the above.   *Total Encounter Time as defined by the Centers for Medicare and Medicaid Services includes, in addition to the face-to-face time of a patient visit (documented in the note above) non-face-to-face time: obtaining and reviewing outside history, ordering and reviewing medications, tests or procedures, care coordination (communications with other health care professionals or caregivers) and documentation in the medical record.

## 2021-01-07 ENCOUNTER — Inpatient Hospital Stay: Payer: Managed Care, Other (non HMO)

## 2021-01-07 ENCOUNTER — Encounter: Payer: Self-pay | Admitting: *Deleted

## 2021-01-07 ENCOUNTER — Inpatient Hospital Stay (HOSPITAL_BASED_OUTPATIENT_CLINIC_OR_DEPARTMENT_OTHER): Payer: Managed Care, Other (non HMO) | Admitting: Oncology

## 2021-01-07 ENCOUNTER — Other Ambulatory Visit: Payer: Self-pay

## 2021-01-07 VITALS — BP 138/85 | HR 89 | Temp 98.9°F | Resp 16 | Wt 160.2 lb

## 2021-01-07 DIAGNOSIS — C50411 Malignant neoplasm of upper-outer quadrant of right female breast: Secondary | ICD-10-CM | POA: Diagnosis not present

## 2021-01-07 DIAGNOSIS — Z171 Estrogen receptor negative status [ER-]: Secondary | ICD-10-CM | POA: Diagnosis not present

## 2021-01-07 DIAGNOSIS — Z5112 Encounter for antineoplastic immunotherapy: Secondary | ICD-10-CM | POA: Diagnosis not present

## 2021-01-07 DIAGNOSIS — Z95828 Presence of other vascular implants and grafts: Secondary | ICD-10-CM

## 2021-01-07 LAB — CMP (CANCER CENTER ONLY)
ALT: 28 U/L (ref 0–44)
AST: 20 U/L (ref 15–41)
Albumin: 3.8 g/dL (ref 3.5–5.0)
Alkaline Phosphatase: 93 U/L (ref 38–126)
Anion gap: 11 (ref 5–15)
BUN: 12 mg/dL (ref 6–20)
CO2: 24 mmol/L (ref 22–32)
Calcium: 9.8 mg/dL (ref 8.9–10.3)
Chloride: 105 mmol/L (ref 98–111)
Creatinine: 0.85 mg/dL (ref 0.44–1.00)
GFR, Estimated: >60 mL/min (ref >60)
Glucose, Bld: 109 mg/dL — ABNORMAL HIGH (ref 70–99)
Potassium: 4.2 mmol/L (ref 3.5–5.1)
Sodium: 140 mmol/L (ref 135–145)
Total Bilirubin: 0.3 mg/dL (ref 0.3–1.2)
Total Protein: 6.9 g/dL (ref 6.5–8.1)

## 2021-01-07 LAB — CBC WITH DIFFERENTIAL (CANCER CENTER ONLY)
Abs Immature Granulocytes: 0.02 10*3/uL (ref 0.00–0.07)
Basophils Absolute: 0 10*3/uL (ref 0.0–0.1)
Basophils Relative: 1 %
Eosinophils Absolute: 0 10*3/uL (ref 0.0–0.5)
Eosinophils Relative: 1 %
HCT: 33.9 % — ABNORMAL LOW (ref 36.0–46.0)
Hemoglobin: 12 g/dL (ref 12.0–15.0)
Immature Granulocytes: 1 %
Lymphocytes Relative: 44 %
Lymphs Abs: 1.8 10*3/uL (ref 0.7–4.0)
MCH: 31.6 pg (ref 26.0–34.0)
MCHC: 35.4 g/dL (ref 30.0–36.0)
MCV: 89.2 fL (ref 80.0–100.0)
Monocytes Absolute: 0.4 10*3/uL (ref 0.1–1.0)
Monocytes Relative: 10 %
Neutro Abs: 1.8 10*3/uL (ref 1.7–7.7)
Neutrophils Relative %: 43 %
Platelet Count: 284 10*3/uL (ref 150–400)
RBC: 3.8 MIL/uL — ABNORMAL LOW (ref 3.87–5.11)
RDW: 12.6 % (ref 11.5–15.5)
WBC Count: 4.1 10*3/uL (ref 4.0–10.5)
nRBC: 0 % (ref 0.0–0.2)

## 2021-01-07 MED ORDER — SODIUM CHLORIDE 0.9 % IV SOLN
65.0000 mg/m2 | Freq: Once | INTRAVENOUS | Status: AC
  Administered 2021-01-07: 120 mg via INTRAVENOUS
  Filled 2021-01-07: qty 20

## 2021-01-07 MED ORDER — COLD PACK MISC ONCOLOGY
1.0000 | Freq: Once | Status: AC | PRN
  Administered 2021-01-07: 1 via TOPICAL

## 2021-01-07 MED ORDER — FAMOTIDINE 20 MG IN NS 100 ML IVPB
20.0000 mg | Freq: Once | INTRAVENOUS | Status: AC
Start: 2021-01-07 — End: 2021-01-07
  Administered 2021-01-07: 20 mg via INTRAVENOUS

## 2021-01-07 MED ORDER — SODIUM CHLORIDE 0.9% FLUSH
10.0000 mL | INTRAVENOUS | Status: DC | PRN
  Administered 2021-01-07: 10 mL

## 2021-01-07 MED ORDER — DIPHENHYDRAMINE HCL 50 MG/ML IJ SOLN
50.0000 mg | Freq: Once | INTRAMUSCULAR | Status: AC
  Administered 2021-01-07: 50 mg via INTRAVENOUS

## 2021-01-07 MED ORDER — SODIUM CHLORIDE 0.9 % IV SOLN
10.0000 mg | Freq: Once | INTRAVENOUS | Status: AC
  Administered 2021-01-07: 10 mg via INTRAVENOUS
  Filled 2021-01-07: qty 10

## 2021-01-07 MED ORDER — FAMOTIDINE 20 MG IN NS 100 ML IVPB
INTRAVENOUS | Status: AC
  Filled 2021-01-07: qty 100

## 2021-01-07 MED ORDER — FLUCONAZOLE 100 MG PO TABS: 100.0000 mg | ORAL_TABLET | Freq: Every day | ORAL | 1 refills | Status: DC

## 2021-01-07 MED ORDER — DIPHENHYDRAMINE HCL 50 MG/ML IJ SOLN
INTRAMUSCULAR | Status: AC
  Filled 2021-01-07: qty 1

## 2021-01-07 MED ORDER — VALACYCLOVIR HCL 500 MG PO TABS: 500.0000 mg | ORAL_TABLET | Freq: Two times a day (BID) | ORAL | 1 refills | Status: DC

## 2021-01-07 MED ORDER — HEPARIN SOD (PORK) LOCK FLUSH 100 UNIT/ML IV SOLN
500.0000 [IU] | Freq: Once | INTRAVENOUS | Status: AC | PRN
  Administered 2021-01-07: 500 [IU]

## 2021-01-07 MED ORDER — SODIUM CHLORIDE 0.9 % IV SOLN: Freq: Once | INTRAVENOUS | Status: AC

## 2021-01-07 NOTE — Patient Instructions (Signed)
[facility]  Discharge Instructions: Thank you for choosing [facility] to provide your oncology and hematology care.   If you have a lab appointment with the Cancer Center, please go directly to the Cancer Center and check in at the registration area.   Wear comfortable clothing and clothing appropriate for easy access to any Portacath or PICC line.   We strive to give you quality time with your provider. You may need to reschedule your appointment if you arrive late (15 or more minutes).  Arriving late affects you and other patients whose appointments are after yours.  Also, if you miss three or more appointments without notifying the office, you may be dismissed from the clinic at the provider's discretion.      For prescription refill requests, have your pharmacy contact our office and allow 72 hours for refills to be completed.    Today you received the following chemotherapy and/or immunotherapy agent: Paclitaxel (Taxol)   To help prevent nausea and vomiting after your treatment, we encourage you to take your nausea medication as directed.  BELOW ARE SYMPTOMS THAT SHOULD BE REPORTED IMMEDIATELY: *FEVER GREATER THAN 100.4 F (38 C) OR HIGHER *CHILLS OR SWEATING *NAUSEA AND VOMITING THAT IS NOT CONTROLLED WITH YOUR NAUSEA MEDICATION *UNUSUAL SHORTNESS OF BREATH *UNUSUAL BRUISING OR BLEEDING *URINARY PROBLEMS (pain or burning when urinating, or frequent urination) *BOWEL PROBLEMS (unusual diarrhea, constipation, pain near the anus) TENDERNESS IN MOUTH AND THROAT WITH OR WITHOUT PRESENCE OF ULCERS (sore throat, sores in mouth, or a toothache) UNUSUAL RASH, SWELLING OR PAIN  UNUSUAL VAGINAL DISCHARGE OR ITCHING   Items with * indicate a potential emergency and should be followed up as soon as possible or go to the Emergency Department if any problems should occur.  Please show the CHEMOTHERAPY ALERT CARD or IMMUNOTHERAPY ALERT CARD at  check-in to the Emergency Department and triage nurse.  Should you have questions after your visit or need to cancel or reschedule your appointment, please contact [facility]  Dept: [phone]  and follow the prompts.  Office hours are 8:00 a.m. to 4:30 p.m. Monday - Friday. Please note that voicemails left after 4:00 p.m. may not be returned until the following business day.  We are closed weekends and major holidays. You have access to a nurse at all times for urgent questions. Please call the main number to the clinic Dept: [phone] and follow the prompts.   For any non-urgent questions, you may also contact your provider using MyChart. We now offer e-Visits for anyone 18 and older to request care online for non-urgent symptoms. For details visit [email or web address].   Also download the MyChart app! Go to the app store, search "MyChart", open the app, select [facility], and log in with your MyChart username and password.  Due to Covid, a mask is required upon entering the hospital/clinic. If you do not have a mask, one will be given to you upon arrival. For doctor visits, patients may have 1 support person aged 18 or older with them. For treatment visits, patients cannot have anyone with them due to current Covid guidelines and our immunocompromised population.  

## 2021-01-07 NOTE — Progress Notes (Signed)
Pt. forgot her cloth headband for forehead for dignicap. We do not have any here. Educated pt. on possible skin breakdown/irritation/frost burn to forehead. Pt. states she would like to proceed without headband. Dignicap applied.

## 2021-01-08 ENCOUNTER — Inpatient Hospital Stay: Payer: Managed Care, Other (non HMO)

## 2021-01-08 ENCOUNTER — Other Ambulatory Visit: Payer: Self-pay

## 2021-01-08 VITALS — BP 156/87 | HR 95 | Temp 98.4°F | Resp 18

## 2021-01-08 DIAGNOSIS — Z171 Estrogen receptor negative status [ER-]: Secondary | ICD-10-CM

## 2021-01-08 DIAGNOSIS — Z5112 Encounter for antineoplastic immunotherapy: Secondary | ICD-10-CM | POA: Diagnosis not present

## 2021-01-08 MED ORDER — FILGRASTIM-AAFI 480 MCG/0.8ML IJ SOSY
480.0000 ug | PREFILLED_SYRINGE | Freq: Once | INTRAMUSCULAR | Status: AC
  Administered 2021-01-08: 480 ug via SUBCUTANEOUS
  Filled 2021-01-08: qty 0.8

## 2021-01-09 ENCOUNTER — Other Ambulatory Visit: Payer: Self-pay

## 2021-01-09 ENCOUNTER — Inpatient Hospital Stay: Payer: Managed Care, Other (non HMO)

## 2021-01-09 ENCOUNTER — Encounter: Payer: Self-pay | Admitting: *Deleted

## 2021-01-09 VITALS — BP 135/86 | HR 94 | Temp 98.5°F | Resp 18

## 2021-01-09 DIAGNOSIS — Z5112 Encounter for antineoplastic immunotherapy: Secondary | ICD-10-CM | POA: Diagnosis not present

## 2021-01-09 DIAGNOSIS — C50411 Malignant neoplasm of upper-outer quadrant of right female breast: Secondary | ICD-10-CM

## 2021-01-09 MED ORDER — FILGRASTIM-AAFI 480 MCG/0.8ML IJ SOSY
480.0000 ug | PREFILLED_SYRINGE | Freq: Once | INTRAMUSCULAR | Status: AC
Start: 2021-01-09 — End: 2021-01-09
  Administered 2021-01-09: 480 ug via SUBCUTANEOUS
  Filled 2021-01-09: qty 0.8

## 2021-01-10 ENCOUNTER — Inpatient Hospital Stay: Payer: Managed Care, Other (non HMO)

## 2021-01-10 VITALS — BP 142/74 | HR 91 | Temp 98.2°F | Resp 18

## 2021-01-10 DIAGNOSIS — C50411 Malignant neoplasm of upper-outer quadrant of right female breast: Secondary | ICD-10-CM

## 2021-01-10 DIAGNOSIS — Z171 Estrogen receptor negative status [ER-]: Secondary | ICD-10-CM

## 2021-01-10 DIAGNOSIS — Z5112 Encounter for antineoplastic immunotherapy: Secondary | ICD-10-CM | POA: Diagnosis not present

## 2021-01-10 MED ORDER — FILGRASTIM-AAFI 480 MCG/0.8ML IJ SOSY
480.0000 ug | PREFILLED_SYRINGE | Freq: Once | INTRAMUSCULAR | Status: AC
  Administered 2021-01-10: 480 ug via SUBCUTANEOUS
  Filled 2021-01-10: qty 0.8

## 2021-01-13 NOTE — Progress Notes (Signed)
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   She began neoadjuvant chemotherapy, consisting of carboplatin, taxol, and keytruda, on 12/03/2020. Today is day 1 cycle 3.  She notes an improvement in the appearance of her breast since beginning chemotherapy.    She has some mild taste changes.  She has remained active, and walked for 2 miles yesterday.  She uses the dignicap for her chemo.    She says that she is due for her chemo tomorrow.  She denies any new issues today.  She has intermittent burning in her toes, however it is brief and infrequent.  She denies any constant numbness or tingling in her fingertips or toes.    REVIEW OF SYSTEMS: Review of Systems  Constitutional:  Negative for appetite change, chills, fatigue, fever and unexpected weight change.  HENT:   Negative for hearing loss, lump/mass and trouble swallowing.   Eyes:  Negative for eye problems and icterus.  Respiratory:  Negative for chest tightness, cough and shortness of breath.   Cardiovascular:  Negative for chest pain, leg swelling and palpitations.  Gastrointestinal:  Negative for abdominal distention, abdominal pain, constipation, diarrhea, nausea and vomiting.  Endocrine: Negative for hot flashes.  Genitourinary:  Negative  for difficulty urinating.   Musculoskeletal:  Negative for arthralgias.  Skin:  Negative for itching and rash.  Neurological:  Negative for dizziness, extremity weakness, headaches and numbness.  Hematological:  Negative for adenopathy. Does not bruise/bleed easily.  Psychiatric/Behavioral:  Negative for depression. The patient is not nervous/anxious.      COVID 19 VACCINATION STATUS: Robin Farley x2, most recently 11/2019; infection 07/2020   HISTORY OF CURRENT ILLNESS: From the original intake note:  Robin Farley presented with a new palpable lump in the right breast. She underwent bilateral diagnostic mammography with tomography and right breast ultrasonography at [facility] on 11/06/2020 showing: breast density category B; 1.9 cm mass at palpable site of concern in right breast at 10 o'clock; 0.8 cm smaller mass in right breast at 9 o'clock; 0.7 cm small mass in right axilla, possibly a replaced lymph node; second mildly suspicious lymph node in axilla.  Accordingly on 11/08/2020 she proceeded to biopsy of the right breast area in question. The pathology from this procedure ([id number]) showed:  1. Right Breast, 10 o'clock - invasive ductal carcinoma, grade 3  - Prognostic indicators significant for: estrogen receptor, 5% "positive "with weak staining intensity (this is functionally negative) and progesterone receptor, 0% negative. Proliferation marker Ki67 at 30%. HER2 equivocal by immunohistochemistry (2+), but negative by fluorescent in situ hybridization with a signals ratio 1.19 and number per cell 1.6. 2. Right Breast, 9 o'clock  - fibrocystic change 3. Right Axilla, lymph node  - metastatic carcinoma involving a lymph node  Cancer Staging Malignant neoplasm of upper-outer quadrant of right breast in female, estrogen receptor negative ([place]) Staging form: Breast, AJCC 8th  Edition - Clinical: Stage IIB (cT1c, cN1, cM0, G3, ER-, PR-, HER2-) - Signed by [name], MD on  11/20/2020 Histologic grading system: 3 grade system  The patient's subsequent history is as detailed below.   PAST MEDICAL HISTORY: Past Medical History:  Diagnosis Date   Abdominal pain, generalized 12/27/2008   Acute sinusitis, unspecified 02/26/2007   ADD 03/13/2010   ALLERGIC RHINITIS 02/23/2007   ANEMIA 02/05/2007   Anxiety 11/17/2011   ATTENTION DEFICIT DISORDER, HX OF 02/05/2007   Breast cancer ([place])    Deviated nasal septum 02/05/2007   Diabetes mellitus without complication ([place])    Family history of breast cancer 11/20/2020   Family history of melanoma 11/20/2020   FATIGUE 02/23/2007   HYPERLIPIDEMIA 02/23/2007   Personal history of malignant melanoma 11/20/2020   SWELLING MASS OR LUMP IN HEAD AND NECK 07/27/2008   UTI 02/23/2007    PAST SURGICAL HISTORY: Past Surgical History:  Procedure Laterality Date   BROW LIFT     CESAREAN [place]   Nasal   MELANOMA EXCISION WITH SENTINEL LYMPH NODE BIOPSY Left 10/24/2012   Procedure: MELANOMA wide EXCISION left lateral thigh WITH SENTINEL LYMPH NODE BIOPSY left groin;  Surgeon: Edward Jolly, MD;  Location: [facility];  Service: General;  Laterality: Left;   NASAL SEPTUM SURGERY     PORTACATH PLACEMENT Right 12/02/2020   Procedure: INSERTION PORT-A-CATH;  Surgeon: Donnie Mesa, MD;  Location: [facility];  Service: General;  Laterality: Right;    FAMILY HISTORY: Family History  Problem Relation Age of Onset   Hypertension Mother    Breast cancer Mother 28   COPD Father    Atrial fibrillation Father    Atrial fibrillation Sister    Kidney cancer Maternal Aunt        dx 35s   Leukemia Paternal Aunt        dx after 11   Cancer Paternal Aunt        unknown type; dx after 23   Brain cancer Paternal Grandfather 73       brain tumor per pt   Melanoma Cousin        arm; no mets per pt   Alcohol abuse Other    Depression Other    Glaucoma Other     Breast cancer Other        MGM's sisters, x3, dx after 20   Colon polyps Neg Hx    Her father died at age 83 from COPD. Her mother is currently 45 years old as of 11/2020. Robin Farley has no full siblings and three half-siblings-- two brothers and one sister. She reports breast cancer in her mother and three great-aunts, brain tumor in her paternal grandfather, and metastatic melanoma in a cousin.   GYNECOLOGIC HISTORY:  Patient's last menstrual period was 04/27/2016. Menarche: [age] years old Age at first live birth: 55 years old [place] P 2 LMP 2016 Contraceptive used for 1-2 years HRT estrogen cream irregularly Hysterectomy? no BSO? 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 She lives in [place], [place] but travels here frequently for work. Husband Robin Farley works in Press photographer for [employer]. She lives at home with Robin Farley and their daughter Robin Farley, who is 57 and is a Ship broker at [employer]. Daughter Robin Farley, age 36, is a Secretary/administrator in [place]. [place]. Suan is not a Designer, fashion/clothing.  ADVANCED DIRECTIVES: In the absence of any documentation to the contrary, the patient's spouse is their HCPOA.    HEALTH MAINTENANCE: Social History   Tobacco Use   Smoking status: Never   Smokeless tobacco: Never  Substance Use Topics   Alcohol use: Yes    Comment: Very rare occasions   Drug use: Never     Colonoscopy: 12/2014 (Dr. Henrene Pastor)  PAP: 2020  Bone density: unsure   Allergies  Allergen Reactions   Promethazine Hcl     Legs twitch    Current Outpatient Medications  Medication Sig Dispense Refill   atorvastatin (LIPITOR) 20 MG tablet Take 1 tablet (20 mg total) by mouth daily. 90 tablet 3   Cholecalciferol (THERA-D 2000) 50 MCG (2000 UT) TABS 1 tab by mouth once daily 30 tablet 99   dexamethasone (DECADRON) 4 MG tablet Take 2 tablets once a day for 3 days after carboplatin and AC chemotherapy. Take with food. 30  tablet 1   Emollient (ROC RETINOL CORREXION EYE) CREA Use as directed daily 15 mL 11   fluconazole (DIFLUCAN) 100 MG tablet Take 1 tablet (100 mg total) by mouth daily. 60 tablet 1   lidocaine-prilocaine (EMLA) cream Apply to affected area once 30 g 3   metFORMIN (GLUCOPHAGE-XR) 500 MG 24 hr tablet Take 1 tablet (500 mg total) by mouth daily with breakfast. 90 tablet 3   prochlorperazine (COMPAZINE) 10 MG tablet Take 1 tablet (10 mg total) by mouth every 6 (six) hours as needed (Nausea or vomiting). 30 tablet 1   valACYclovir (VALTREX) 500 MG tablet Take 1 tablet (500 mg total) by mouth 2 (two) times daily. 60 tablet 1   zolpidem (AMBIEN) 5 MG tablet Take 1 tablet (5 mg total) by mouth at bedtime as needed for sleep. 20 tablet 0   No current facility-administered medications for this visit.    OBJECTIVE:   Vitals:   01/14/21 0851  BP: 126/67  Pulse: 81  Temp: 98.1 F (36.7 C)  SpO2: 100%      Body mass index is 25.81 kg/m.   Wt Readings from Last 3 Encounters:  01/14/21 159 lb 14.4 oz (72.5 kg)  01/07/21 160 lb 4 oz (72.7 kg)  12/23/20 158 lb 8 oz (71.9 kg)     ECOG FS:1 - Symptomatic but completely ambulatory GENERAL: Patient is a well appearing female in no acute distress HEENT:  Sclerae anicteric.  Oropharynx clear and moist. No ulcerations or evidence of oropharyngeal candidiasis. Neck is supple.  NODES:  No cervical, supraclavicular, or axillary lymphadenopathy palpated.  BREAST EXAM:  no progression noted LUNGS:  Clear to auscultation bilaterally.  No wheezes or rhonchi. HEART:  Regular rate and rhythm. No murmur appreciated. ABDOMEN:  Soft, nontender.  Positive, normoactive bowel sounds. No organomegaly palpated. MSK:  No focal spinal tenderness to palpation. Full range of motion bilaterally in the upper extremities. EXTREMITIES:  No peripheral edema.   SKIN:  Clear with no obvious rashes or skin changes. No nail dyscrasia. NEURO:  Nonfocal. Well oriented.  Appropriate  affect.   LAB RESULTS:  CMP     Component Value Date/Time   NA 140 01/07/2021 1210   K 4.2 01/07/2021 1210   CL 105 01/07/2021 1210   CO2 24 01/07/2021 1210   GLUCOSE 109 (H) 01/07/2021 1210   BUN 12 01/07/2021 1210   CREATININE 0.85 01/07/2021 1210   CALCIUM 9.8 01/07/2021 1210   PROT 6.9 01/07/2021 1210   ALBUMIN 3.8 01/07/2021 1210  AST 20 01/07/2021 1210   ALT 28 01/07/2021 1210   ALKPHOS 93 01/07/2021 1210   BILITOT 0.3 01/07/2021 1210   GFRNONAA >60 01/07/2021 1210   GFRAA 100 03/23/2008 1212    No results found for: TOTALPROTELP, ALBUMINELP, A1GS, A2GS, BETS, BETA2SER, GAMS, MSPIKE, SPEI  Lab Results  Component Value Date   WBC 5.7 01/14/2021   NEUTROABS 2.7 01/14/2021   HGB 11.6 (L) 01/14/2021   HCT 34.5 (L) 01/14/2021   MCV 92.0 01/14/2021   PLT 163 01/14/2021    No results found for: LABCA2  No components found for: YHCWCB762  No results for input(s): INR in the last 168 hours.  No results found for: LABCA2  No results found for: GBT517  No results found for: OHY073  No results found for: XTG626  No results found for: CA2729  No components found for: HGQUANT  No results found for: CEA1 / No results found for: CEA1   No results found for: AFPTUMOR  No results found for: CHROMOGRNA  No results found for: KPAFRELGTCHN, LAMBDASER, KAPLAMBRATIO (kappa/lambda light chains)  No results found for: HGBA, HGBA2QUANT, HGBFQUANT, HGBSQUAN (Hemoglobinopathy evaluation)   No results found for: LDH  Lab Results  Component Value Date   IRON 93 12/16/2018   IRONPCTSAT 25.6 12/16/2018   (Iron and TIBC)  No results found for: FERRITIN  Urinalysis    Component Value Date/Time   COLORURINE YELLOW 12/20/2019 1521   APPEARANCEUR CLEAR 12/20/2019 1521   LABSPEC 1.017 12/20/2019 1521   PHURINE 6.5 12/20/2019 1521   GLUCOSEU NEGATIVE 12/20/2019 1521   GLUCOSEU NEGATIVE 12/16/2018 1512   HGBUR NEGATIVE 12/20/2019 1521   BILIRUBINUR NEGATIVE  12/16/2018 1512   KETONESUR NEGATIVE 12/20/2019 1521   PROTEINUR NEGATIVE 12/20/2019 1521   UROBILINOGEN 0.2 12/16/2018 1512   NITRITE NEGATIVE 12/20/2019 1521   LEUKOCYTESUR NEGATIVE 12/20/2019 1521    STUDIES: No results found.   ELIGIBLE FOR AVAILABLE RESEARCH PROTOCOL: no  ASSESSMENT: 55 y.o. [place] woman status post right breast upper outer quadrant biopsy 11/08/2020 for a clinical T1c N1, stage IIB invasive ductal carcinoma, grade 3, functionally triple negative, with an MIB-1 of 30%  (A) right breast retroareolar biopsy 12/09/2020 shows invasive ductal carcinoma, grade 2  (1) genetics test 12/03/2020 through the [facility] [place] +RNAinsight Panel found no deleterious mutations in AIP, ALK, APC, ATM, AXIN2, BAP1, BARD1, BLM, BMPR1A, BRCA1, BRCA2, BRIP1, CDC73, CDH1, CDK4, CDKN1B, CDKN2A, CHEK2, CTNNA1, DICER1, FANCC, FH, FLCN, GALNT12, KIF1B, LZTR1, MAX, MEN1, MET, MLH1, MSH2, MSH3, MSH6, MUTYH, NBN, NF1, NF2, NTHL1, PALB2, PHOX2B, PMS2, POT1, PRKAR1A, PTCH1, PTEN, RAD51C, RAD51D, RB1, RECQL, RET, SDHA, SDHAF2, SDHB, SDHC, SDHD, SMAD4, SMARCA4, SMARCB1, SMARCE1, STK11, SUFU, TMEM127, TP53, TSC1, TSC2, VHL and XRCC2 (sequencing and deletion/duplication); EGFR, EGLN1, HOXB13, KIT, MITF, PDGFRA, POLD1, and POLE (sequencing only); EPCAM and GREM1 (deletion/duplication only).    (2) neoadjuvant chemotherapy to consist of carboplatin and paclitaxel with pembrolizumab every 21 days x 4 starting 12/03/2020, to be followed by doxorubicin and cyclophosphamide with pembrolizumab every 21 days x 4  (3) definitive surgery to follow  (4) adjuvant radiation   PLAN: Robin Farley is here today prior to receiving cycle 3 day 1 of Pembrolizumab, Carboplatin, and Paclitaxel.  Her labs thus far are stable (CMET pending).  I added a TSH onto her labs so we can follow this with the Pembrolizumab.    Robin Farley does not have peripheral neruopathy.  I reviewed with her what this feels like and what  to pay attention to,  so that she can be aware of the feeling she gets in her toes on occasion.  She noted this symptom is unchanged since she reviewed it with Dr. Jana Hakim.    We reviewed diet and exercise.  She is using the dignicap well and has maintained  a lot of her hair.    Robin Farley will return tomorrow for chemotherapy.  She will return next week to receive her Paclitaxel. She knows to call for any questions that may arise between now and her next appointment.  We are happy to see her sooner if needed.   Total encounter time 30 minutes* in face to face visit time, chart review, lab review, care coordination, order entry, and documentation of the encounter.   Wilber Bihari, NP 01/14/21 8:59 AM [facility] [place], [place] [address] Tel. [phone]    Fax. [phone]   *Total Encounter Time as defined by the Centers for Medicare and Medicaid Services includes, in addition to the face-to-face time of a patient visit (documented in the note above) non-face-to-face time: obtaining and reviewing outside history, ordering and reviewing medications, tests or procedures, care coordination (communications with other health care professionals or caregivers) and documentation in the medical record.

## 2021-01-14 ENCOUNTER — Other Ambulatory Visit: Payer: Self-pay

## 2021-01-14 ENCOUNTER — Encounter: Payer: Self-pay | Admitting: Adult Health

## 2021-01-14 ENCOUNTER — Inpatient Hospital Stay: Payer: Managed Care, Other (non HMO) | Attending: Oncology

## 2021-01-14 ENCOUNTER — Inpatient Hospital Stay (HOSPITAL_BASED_OUTPATIENT_CLINIC_OR_DEPARTMENT_OTHER): Payer: Managed Care, Other (non HMO) | Admitting: Adult Health

## 2021-01-14 VITALS — BP 126/67 | HR 81 | Temp 98.1°F | Wt 159.9 lb

## 2021-01-14 DIAGNOSIS — Z171 Estrogen receptor negative status [ER-]: Secondary | ICD-10-CM

## 2021-01-14 DIAGNOSIS — C773 Secondary and unspecified malignant neoplasm of axilla and upper limb lymph nodes: Secondary | ICD-10-CM | POA: Insufficient documentation

## 2021-01-14 DIAGNOSIS — C50411 Malignant neoplasm of upper-outer quadrant of right female breast: Secondary | ICD-10-CM | POA: Diagnosis present

## 2021-01-14 DIAGNOSIS — Z95828 Presence of other vascular implants and grafts: Secondary | ICD-10-CM

## 2021-01-14 DIAGNOSIS — Z5112 Encounter for antineoplastic immunotherapy: Secondary | ICD-10-CM | POA: Diagnosis present

## 2021-01-14 DIAGNOSIS — E119 Type 2 diabetes mellitus without complications: Secondary | ICD-10-CM | POA: Diagnosis not present

## 2021-01-14 DIAGNOSIS — Z5189 Encounter for other specified aftercare: Secondary | ICD-10-CM | POA: Insufficient documentation

## 2021-01-14 DIAGNOSIS — Z7984 Long term (current) use of oral hypoglycemic drugs: Secondary | ICD-10-CM | POA: Insufficient documentation

## 2021-01-14 DIAGNOSIS — Z79899 Other long term (current) drug therapy: Secondary | ICD-10-CM | POA: Insufficient documentation

## 2021-01-14 DIAGNOSIS — Z5111 Encounter for antineoplastic chemotherapy: Secondary | ICD-10-CM | POA: Insufficient documentation

## 2021-01-14 LAB — CBC WITH DIFFERENTIAL (CANCER CENTER ONLY)
Abs Immature Granulocytes: 0.44 10*3/uL — ABNORMAL HIGH (ref 0.00–0.07)
Basophils Absolute: 0 10*3/uL (ref 0.0–0.1)
Basophils Relative: 1 %
Eosinophils Absolute: 0 10*3/uL (ref 0.0–0.5)
Eosinophils Relative: 1 %
HCT: 34.5 % — ABNORMAL LOW (ref 36.0–46.0)
Hemoglobin: 11.6 g/dL — ABNORMAL LOW (ref 12.0–15.0)
Immature Granulocytes: 8 %
Lymphocytes Relative: 37 %
Lymphs Abs: 2.1 10*3/uL (ref 0.7–4.0)
MCH: 30.9 pg (ref 26.0–34.0)
MCHC: 33.6 g/dL (ref 30.0–36.0)
MCV: 92 fL (ref 80.0–100.0)
Monocytes Absolute: 0.4 10*3/uL (ref 0.1–1.0)
Monocytes Relative: 7 %
Neutro Abs: 2.7 10*3/uL (ref 1.7–7.7)
Neutrophils Relative %: 46 %
Platelet Count: 163 10*3/uL (ref 150–400)
RBC: 3.75 MIL/uL — ABNORMAL LOW (ref 3.87–5.11)
RDW: 13.3 % (ref 11.5–15.5)
WBC Count: 5.7 10*3/uL (ref 4.0–10.5)
nRBC: 0 % (ref 0.0–0.2)

## 2021-01-14 LAB — CMP (CANCER CENTER ONLY)
ALT: 47 U/L — ABNORMAL HIGH (ref 0–44)
AST: 27 U/L (ref 15–41)
Albumin: 3.5 g/dL (ref 3.5–5.0)
Alkaline Phosphatase: 96 U/L (ref 38–126)
Anion gap: 8 (ref 5–15)
BUN: 15 mg/dL (ref 6–20)
CO2: 24 mmol/L (ref 22–32)
Calcium: 9.1 mg/dL (ref 8.9–10.3)
Chloride: 107 mmol/L (ref 98–111)
Creatinine: 0.8 mg/dL (ref 0.44–1.00)
GFR, Estimated: >60 mL/min (ref >60)
Glucose, Bld: 89 mg/dL (ref 70–99)
Potassium: 4 mmol/L (ref 3.5–5.1)
Sodium: 139 mmol/L (ref 135–145)
Total Bilirubin: 0.3 mg/dL (ref 0.3–1.2)
Total Protein: 6.3 g/dL — ABNORMAL LOW (ref 6.5–8.1)

## 2021-01-14 LAB — TSH: TSH: 2.155 u[IU]/mL (ref 0.308–3.960)

## 2021-01-14 MED ORDER — SODIUM CHLORIDE 0.9% FLUSH
10.0000 mL | INTRAVENOUS | Status: DC | PRN
  Administered 2021-01-14: 10 mL

## 2021-01-14 MED ORDER — DEXAMETHASONE 4 MG PO TABS: ORAL_TABLET | ORAL | 1 refills | Status: DC

## 2021-01-14 MED FILL — Dexamethasone Sodium Phosphate Inj 100 MG/10ML: INTRAMUSCULAR | Qty: 1 | Status: AC

## 2021-01-14 MED FILL — Fosaprepitant Dimeglumine For IV Infusion 150 MG (Base Eq): INTRAVENOUS | Qty: 5 | Status: AC

## 2021-01-15 ENCOUNTER — Other Ambulatory Visit: Payer: Self-pay

## 2021-01-15 ENCOUNTER — Inpatient Hospital Stay: Payer: Managed Care, Other (non HMO)

## 2021-01-15 VITALS — BP 116/84 | HR 98 | Temp 98.6°F | Resp 20

## 2021-01-15 DIAGNOSIS — C50411 Malignant neoplasm of upper-outer quadrant of right female breast: Secondary | ICD-10-CM

## 2021-01-15 DIAGNOSIS — Z171 Estrogen receptor negative status [ER-]: Secondary | ICD-10-CM

## 2021-01-15 DIAGNOSIS — Z5112 Encounter for antineoplastic immunotherapy: Secondary | ICD-10-CM | POA: Diagnosis not present

## 2021-01-15 MED ORDER — PALONOSETRON HCL INJECTION 0.25 MG/5ML
0.2500 mg | Freq: Once | INTRAVENOUS | Status: AC
  Administered 2021-01-15: 0.25 mg via INTRAVENOUS
  Filled 2021-01-15: qty 5

## 2021-01-15 MED ORDER — SODIUM CHLORIDE 0.9 % IV SOLN: Freq: Once | INTRAVENOUS | Status: AC

## 2021-01-15 MED ORDER — DIPHENHYDRAMINE HCL 50 MG/ML IJ SOLN
50.0000 mg | Freq: Once | INTRAMUSCULAR | Status: AC
  Administered 2021-01-15: 50 mg via INTRAVENOUS
  Filled 2021-01-15: qty 1

## 2021-01-15 MED ORDER — SODIUM CHLORIDE 0.9% FLUSH
10.0000 mL | INTRAVENOUS | Status: DC | PRN
  Administered 2021-01-15: 10 mL

## 2021-01-15 MED ORDER — SODIUM CHLORIDE 0.9 % IV SOLN
10.0000 mg | Freq: Once | INTRAVENOUS | Status: AC
  Administered 2021-01-15: 10 mg via INTRAVENOUS
  Filled 2021-01-15: qty 10

## 2021-01-15 MED ORDER — SODIUM CHLORIDE 0.9 % IV SOLN
65.0000 mg/m2 | Freq: Once | INTRAVENOUS | Status: AC
  Administered 2021-01-15: 120 mg via INTRAVENOUS
  Filled 2021-01-15: qty 20

## 2021-01-15 MED ORDER — HEPARIN SOD (PORK) LOCK FLUSH 100 UNIT/ML IV SOLN
500.0000 [IU] | Freq: Once | INTRAVENOUS | Status: AC | PRN
  Administered 2021-01-15: 500 [IU]

## 2021-01-15 MED ORDER — SODIUM CHLORIDE 0.9 % IV SOLN
150.0000 mg | Freq: Once | INTRAVENOUS | Status: AC
  Administered 2021-01-15: 150 mg via INTRAVENOUS
  Filled 2021-01-15: qty 150

## 2021-01-15 MED ORDER — SODIUM CHLORIDE 0.9 % IV SOLN
553.5000 mg | Freq: Once | INTRAVENOUS | Status: AC
  Administered 2021-01-15: 550 mg via INTRAVENOUS
  Filled 2021-01-15: qty 55

## 2021-01-15 MED ORDER — SODIUM CHLORIDE 0.9 % IV SOLN
200.0000 mg | Freq: Once | INTRAVENOUS | Status: AC
  Administered 2021-01-15: 200 mg via INTRAVENOUS
  Filled 2021-01-15: qty 8

## 2021-01-15 MED ORDER — FAMOTIDINE 20 MG IN NS 100 ML IVPB
20.0000 mg | Freq: Once | INTRAVENOUS | Status: AC
  Administered 2021-01-15: 20 mg via INTRAVENOUS
  Filled 2021-01-15: qty 100

## 2021-01-15 NOTE — Patient Instructions (Signed)
[facility]ICAL [facility]OG[facility]  Discharge Instructions: Thank you for choosing [facility] to provide your oncology and hematology care.   If you have a lab appointment with the Cancer Center, please go directly to the Cancer Center and check in at the registration area.   Wear comfortable clothing and clothing appropriate for easy access to any Portacath or PICC line.   We strive to give you quality time with your provider. You may need to reschedule your appointment if you arrive late (15 or more minutes).  Arriving late affects you and other patients whose appointments are after yours.  Also, if you miss three or more appointments without notifying the office, you may be dismissed from the clinic at the provider's discretion.      For prescription refill requests, have your pharmacy contact our office and allow 72 hours for refills to be completed.    Today you received the following chemotherapy and/or immunotherapy agents: Keytruda, Taxol, Carboplatin     To help prevent nausea and vomiting after your treatment, we encourage you to take your nausea medication as directed.  BELOW ARE SYMPTOMS THAT SHOULD BE REPORTED IMMEDIATELY: . *FEVER GREATER THAN 100.4 F (38 C) OR HIGHER . *CHILLS OR SWEATING . *NAUSEA AND VOMITING THAT IS NOT CONTROLLED WITH YOUR NAUSEA MEDICATION . *UNUSUAL SHORTNESS OF BREATH . *UNUSUAL BRUISING OR BLEEDING . *URINARY PROBLEMS (pain or burning when urinating, or frequent urination) . *BOWEL PROBLEMS (unusual diarrhea, constipation, pain near the anus) . TENDERNESS IN MOUTH AND THROAT WITH OR WITHOUT PRESENCE OF ULCERS (sore throat, sores in mouth, or a toothache) . UNUSUAL RASH, SWELLING OR PAIN  . UNUSUAL VAGINAL DISCHARGE OR ITCHING   Items with * indicate a potential emergency and should be followed up as soon as possible or go to the Emergency Department if any problems should occur.  Please show the CHEMOTHERAPY ALERT CARD or  IMMUNOTHERAPY ALERT CARD at check-in to the Emergency Department and triage nurse.  Should you have questions after your visit or need to cancel or reschedule your appointment, please contact [facility]  Dept: [phone]  and follow the prompts.  Office hours are 8:00 a.m. to 4:30 p.m. Monday - Friday. Please note that voicemails left after 4:00 p.m. may not be returned until the following business day.  We are closed weekends and major holidays. You have access to a nurse at all times for urgent questions. Please call the main number to the clinic Dept: [phone] and follow the prompts.   For any non-urgent questions, you may also contact your provider using MyChart. We now offer e-Visits for anyone 18 and older to request care online for non-urgent symptoms. For details visit [email or web address].   Also download the MyChart app! Go to the app store, search "MyChart", open the app, select , and log in with your MyChart username and password.  Due to Covid, a mask is required upon entering the hospital/clinic. If you do not have a mask, one will be given to you upon arrival. For doctor visits, patients may have 1 support person aged 18 or older with them. For treatment visits, patients cannot have anyone with them due to current Covid guidelines and our immunocompromised population.   

## 2021-01-15 NOTE — Progress Notes (Signed)
Dr. Jana Hakim made aware of elevated ALT, he advised to proceed with treatment today.

## 2021-01-19 ENCOUNTER — Other Ambulatory Visit: Payer: Self-pay | Admitting: Oncology

## 2021-01-20 MED FILL — Dexamethasone Sodium Phosphate Inj 100 MG/10ML: INTRAMUSCULAR | Qty: 1 | Status: AC

## 2021-01-20 NOTE — Telephone Encounter (Signed)
Filled on 9/27 by Dr. Jana Hakim. Gardiner Rhyme, RN

## 2021-01-21 ENCOUNTER — Other Ambulatory Visit: Payer: Self-pay

## 2021-01-21 ENCOUNTER — Inpatient Hospital Stay: Payer: Managed Care, Other (non HMO)

## 2021-01-21 VITALS — BP 135/93 | HR 83 | Temp 98.4°F | Resp 16 | Wt 159.0 lb

## 2021-01-21 DIAGNOSIS — C50411 Malignant neoplasm of upper-outer quadrant of right female breast: Secondary | ICD-10-CM

## 2021-01-21 DIAGNOSIS — Z95828 Presence of other vascular implants and grafts: Secondary | ICD-10-CM

## 2021-01-21 DIAGNOSIS — Z171 Estrogen receptor negative status [ER-]: Secondary | ICD-10-CM

## 2021-01-21 DIAGNOSIS — Z5112 Encounter for antineoplastic immunotherapy: Secondary | ICD-10-CM | POA: Diagnosis not present

## 2021-01-21 LAB — CMP (CANCER CENTER ONLY)
ALT: 42 U/L (ref 0–44)
AST: 17 U/L (ref 15–41)
Albumin: 3.5 g/dL (ref 3.5–5.0)
Alkaline Phosphatase: 82 U/L (ref 38–126)
Anion gap: 9 (ref 5–15)
BUN: 14 mg/dL (ref 6–20)
CO2: 26 mmol/L (ref 22–32)
Calcium: 9.4 mg/dL (ref 8.9–10.3)
Chloride: 106 mmol/L (ref 98–111)
Creatinine: 0.78 mg/dL (ref 0.44–1.00)
GFR, Estimated: >60 mL/min (ref >60)
Glucose, Bld: 89 mg/dL (ref 70–99)
Potassium: 4.1 mmol/L (ref 3.5–5.1)
Sodium: 141 mmol/L (ref 135–145)
Total Bilirubin: 0.5 mg/dL (ref 0.3–1.2)
Total Protein: 6.7 g/dL (ref 6.5–8.1)

## 2021-01-21 LAB — CBC WITH DIFFERENTIAL (CANCER CENTER ONLY)
Abs Immature Granulocytes: 0.03 10*3/uL (ref 0.00–0.07)
Basophils Absolute: 0 10*3/uL (ref 0.0–0.1)
Basophils Relative: 0 %
Eosinophils Absolute: 0 10*3/uL (ref 0.0–0.5)
Eosinophils Relative: 1 %
HCT: 35.2 % — ABNORMAL LOW (ref 36.0–46.0)
Hemoglobin: 11.9 g/dL — ABNORMAL LOW (ref 12.0–15.0)
Immature Granulocytes: 1 %
Lymphocytes Relative: 40 %
Lymphs Abs: 1.5 10*3/uL (ref 0.7–4.0)
MCH: 30.9 pg (ref 26.0–34.0)
MCHC: 33.8 g/dL (ref 30.0–36.0)
MCV: 91.4 fL (ref 80.0–100.0)
Monocytes Absolute: 0.1 10*3/uL (ref 0.1–1.0)
Monocytes Relative: 3 %
Neutro Abs: 2.1 10*3/uL (ref 1.7–7.7)
Neutrophils Relative %: 55 %
Platelet Count: 196 10*3/uL (ref 150–400)
RBC: 3.85 MIL/uL — ABNORMAL LOW (ref 3.87–5.11)
RDW: 13.7 % (ref 11.5–15.5)
WBC Count: 3.7 10*3/uL — ABNORMAL LOW (ref 4.0–10.5)
nRBC: 0 % (ref 0.0–0.2)

## 2021-01-21 LAB — TSH: TSH: 2.902 u[IU]/mL (ref 0.308–3.960)

## 2021-01-21 MED ORDER — SODIUM CHLORIDE 0.9 % IV SOLN
65.0000 mg/m2 | Freq: Once | INTRAVENOUS | Status: AC
  Administered 2021-01-21: 120 mg via INTRAVENOUS
  Filled 2021-01-21: qty 20

## 2021-01-21 MED ORDER — FAMOTIDINE 20 MG IN NS 100 ML IVPB
20.0000 mg | Freq: Once | INTRAVENOUS | Status: AC
  Administered 2021-01-21: 20 mg via INTRAVENOUS
  Filled 2021-01-21: qty 100

## 2021-01-21 MED ORDER — HEPARIN SOD (PORK) LOCK FLUSH 100 UNIT/ML IV SOLN
500.0000 [IU] | Freq: Once | INTRAVENOUS | Status: AC | PRN
  Administered 2021-01-21: 500 [IU]

## 2021-01-21 MED ORDER — SODIUM CHLORIDE 0.9% FLUSH
10.0000 mL | INTRAVENOUS | Status: DC | PRN
  Administered 2021-01-21: 10 mL

## 2021-01-21 MED ORDER — SODIUM CHLORIDE 0.9 % IV SOLN
10.0000 mg | Freq: Once | INTRAVENOUS | Status: AC
  Administered 2021-01-21: 10 mg via INTRAVENOUS
  Filled 2021-01-21: qty 10

## 2021-01-21 MED ORDER — DIPHENHYDRAMINE HCL 50 MG/ML IJ SOLN
50.0000 mg | Freq: Once | INTRAMUSCULAR | Status: AC
  Administered 2021-01-21: 50 mg via INTRAVENOUS
  Filled 2021-01-21: qty 1

## 2021-01-21 MED ORDER — SODIUM CHLORIDE 0.9 % IV SOLN: Freq: Once | INTRAVENOUS | Status: AC

## 2021-01-21 NOTE — Patient Instructions (Signed)
[facility]  Discharge Instructions: Thank you for choosing [facility] to provide your oncology and hematology care.   If you have a lab appointment with the Cancer Center, please go directly to the Cancer Center and check in at the registration area.   Wear comfortable clothing and clothing appropriate for easy access to any Portacath or PICC line.   We strive to give you quality time with your provider. You may need to reschedule your appointment if you arrive late (15 or more minutes).  Arriving late affects you and other patients whose appointments are after yours.  Also, if you miss three or more appointments without notifying the office, you may be dismissed from the clinic at the provider's discretion.      For prescription refill requests, have your pharmacy contact our office and allow 72 hours for refills to be completed.    Today you received the following chemotherapy and/or immunotherapy agent: Paclitaxel (Taxol)   To help prevent nausea and vomiting after your treatment, we encourage you to take your nausea medication as directed.  BELOW ARE SYMPTOMS THAT SHOULD BE REPORTED IMMEDIATELY: *FEVER GREATER THAN 100.4 F (38 C) OR HIGHER *CHILLS OR SWEATING *NAUSEA AND VOMITING THAT IS NOT CONTROLLED WITH YOUR NAUSEA MEDICATION *UNUSUAL SHORTNESS OF BREATH *UNUSUAL BRUISING OR BLEEDING *URINARY PROBLEMS (pain or burning when urinating, or frequent urination) *BOWEL PROBLEMS (unusual diarrhea, constipation, pain near the anus) TENDERNESS IN MOUTH AND THROAT WITH OR WITHOUT PRESENCE OF ULCERS (sore throat, sores in mouth, or a toothache) UNUSUAL RASH, SWELLING OR PAIN  UNUSUAL VAGINAL DISCHARGE OR ITCHING   Items with * indicate a potential emergency and should be followed up as soon as possible or go to the Emergency Department if any problems should occur.  Please show the CHEMOTHERAPY ALERT CARD or IMMUNOTHERAPY ALERT CARD at  check-in to the Emergency Department and triage nurse.  Should you have questions after your visit or need to cancel or reschedule your appointment, please contact [facility]  Dept: [phone]  and follow the prompts.  Office hours are 8:00 a.m. to 4:30 p.m. Monday - Friday. Please note that voicemails left after 4:00 p.m. may not be returned until the following business day.  We are closed weekends and major holidays. You have access to a nurse at all times for urgent questions. Please call the main number to the clinic Dept: [phone] and follow the prompts.   For any non-urgent questions, you may also contact your provider using MyChart. We now offer e-Visits for anyone 18 and older to request care online for non-urgent symptoms. For details visit [email or web address].   Also download the MyChart app! Go to the app store, search "MyChart", open the app, select [facility], and log in with your MyChart username and password.  Due to Covid, a mask is required upon entering the hospital/clinic. If you do not have a mask, one will be given to you upon arrival. For doctor visits, patients may have 1 support person aged 57 or older with them. For treatment visits, patients cannot have anyone with them due to current Covid guidelines and our immunocompromised population.  

## 2021-01-27 MED FILL — Dexamethasone Sodium Phosphate Inj 100 MG/10ML: INTRAMUSCULAR | Qty: 1 | Status: AC

## 2021-01-28 ENCOUNTER — Inpatient Hospital Stay: Payer: Managed Care, Other (non HMO)

## 2021-01-28 ENCOUNTER — Encounter: Payer: Self-pay | Admitting: Adult Health

## 2021-01-28 ENCOUNTER — Inpatient Hospital Stay (HOSPITAL_BASED_OUTPATIENT_CLINIC_OR_DEPARTMENT_OTHER): Payer: Managed Care, Other (non HMO) | Admitting: Adult Health

## 2021-01-28 ENCOUNTER — Other Ambulatory Visit (HOSPITAL_COMMUNITY): Payer: Self-pay

## 2021-01-28 ENCOUNTER — Other Ambulatory Visit: Payer: Self-pay

## 2021-01-28 ENCOUNTER — Encounter: Payer: Self-pay | Admitting: Oncology

## 2021-01-28 VITALS — BP 128/64 | HR 103 | Temp 97.7°F | Resp 19 | Ht 66.0 in | Wt 161.5 lb

## 2021-01-28 DIAGNOSIS — Z171 Estrogen receptor negative status [ER-]: Secondary | ICD-10-CM

## 2021-01-28 DIAGNOSIS — Z95828 Presence of other vascular implants and grafts: Secondary | ICD-10-CM

## 2021-01-28 DIAGNOSIS — C50411 Malignant neoplasm of upper-outer quadrant of right female breast: Secondary | ICD-10-CM

## 2021-01-28 DIAGNOSIS — Z5112 Encounter for antineoplastic immunotherapy: Secondary | ICD-10-CM | POA: Diagnosis not present

## 2021-01-28 LAB — CBC WITH DIFFERENTIAL (CANCER CENTER ONLY)
Abs Immature Granulocytes: 0.05 10*3/uL (ref 0.00–0.07)
Basophils Absolute: 0 10*3/uL (ref 0.0–0.1)
Basophils Relative: 0 %
Eosinophils Absolute: 0 10*3/uL (ref 0.0–0.5)
Eosinophils Relative: 1 %
HCT: 34.2 % — ABNORMAL LOW (ref 36.0–46.0)
Hemoglobin: 11.6 g/dL — ABNORMAL LOW (ref 12.0–15.0)
Immature Granulocytes: 1 %
Lymphocytes Relative: 40 %
Lymphs Abs: 1.8 10*3/uL (ref 0.7–4.0)
MCH: 31.2 pg (ref 26.0–34.0)
MCHC: 33.9 g/dL (ref 30.0–36.0)
MCV: 91.9 fL (ref 80.0–100.0)
Monocytes Absolute: 0.4 10*3/uL (ref 0.1–1.0)
Monocytes Relative: 8 %
Neutro Abs: 2.2 10*3/uL (ref 1.7–7.7)
Neutrophils Relative %: 50 %
Platelet Count: 283 10*3/uL (ref 150–400)
RBC: 3.72 MIL/uL — ABNORMAL LOW (ref 3.87–5.11)
RDW: 14.6 % (ref 11.5–15.5)
WBC Count: 4.5 10*3/uL (ref 4.0–10.5)
nRBC: 0 % (ref 0.0–0.2)

## 2021-01-28 LAB — CMP (CANCER CENTER ONLY)
ALT: 42 U/L (ref 0–44)
AST: 16 U/L (ref 15–41)
Albumin: 3.4 g/dL — ABNORMAL LOW (ref 3.5–5.0)
Alkaline Phosphatase: 80 U/L (ref 38–126)
Anion gap: 11 (ref 5–15)
BUN: 16 mg/dL (ref 6–20)
CO2: 24 mmol/L (ref 22–32)
Calcium: 9.4 mg/dL (ref 8.9–10.3)
Chloride: 108 mmol/L (ref 98–111)
Creatinine: 0.78 mg/dL (ref 0.44–1.00)
GFR, Estimated: >60 mL/min (ref >60)
Glucose, Bld: 101 mg/dL — ABNORMAL HIGH (ref 70–99)
Potassium: 3.7 mmol/L (ref 3.5–5.1)
Sodium: 143 mmol/L (ref 135–145)
Total Bilirubin: 0.3 mg/dL (ref 0.3–1.2)
Total Protein: 6.6 g/dL (ref 6.5–8.1)

## 2021-01-28 LAB — TSH: TSH: 2.314 u[IU]/mL (ref 0.308–3.960)

## 2021-01-28 MED ORDER — FLUCONAZOLE 100 MG PO TABS
100.0000 mg | ORAL_TABLET | Freq: Every day | ORAL | 1 refills | Status: DC
  Filled 2021-01-28 – 2021-01-30 (×3): qty 60, 60d supply, fill #0

## 2021-01-28 MED ORDER — NYSTATIN 100000 UNIT/ML MT SUSP
OROMUCOSAL | 0 refills | Status: DC
  Filled 2021-01-28: qty 240, 12d supply, fill #0

## 2021-01-28 MED ORDER — SODIUM CHLORIDE 0.9% FLUSH
10.0000 mL | INTRAVENOUS | Status: DC | PRN
  Administered 2021-01-28: 10 mL

## 2021-01-28 MED ORDER — VALACYCLOVIR HCL 1 G PO TABS
1000.0000 mg | ORAL_TABLET | Freq: Two times a day (BID) | ORAL | 0 refills | Status: DC
  Filled 2021-01-28: qty 180, 90d supply, fill #0
  Filled 2021-01-29: qty 30, 15d supply, fill #0

## 2021-01-28 NOTE — Progress Notes (Addendum)
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   She began neoadjuvant chemotherapy, consisting of carboplatin, taxol, and keytruda, on 12/03/2020. She receives [place] on days 1 of a 21 day cycle and Taxol on day 1, 8, and 15 of a 21 day cycle.  Today is day 15 cycle 3.  Debera will receive taxol alone.  She notes an improvement in the appearance of her breast since beginning chemotherapy.     We are following her TSH since she is receiving Pembrolizumab, levels are noted below.   Results for CONCHITA, TRUXILLO (MRN [id number]) as of 01/28/2021 09:16  Ref. Range 12/16/2018 15:12 12/20/2019 15:21 12/10/2020 10:30 01/14/2021 09:17 01/21/2021 08:22  TSH Latest Ref Range: 0.308 - 3.960 uIU/mL 2.38 2.01 1.333 2.155 2.902    Santiana has had some difficulty this past week with mucositis.  She has ulcerations inside her upper and lower lip, her mouth is swollen.  Eating is painful, but she is still eating and is not losing weight.  Her HR is slightly elevated today, and she notes that she is pushing fluids, but might be slightly behind from the week prior.    She also notes some "warmth" in the pads of her toes and the first third of the sole of her foot.  She denies any pain or numbness to the area.     REVIEW OF SYSTEMS: Review of Systems  Constitutional:  Positive for fatigue. Negative for appetite change, chills, fever and unexpected weight change.  HENT:   Positive for mouth sores. Negative for hearing loss, lump/mass and trouble swallowing.   Eyes:  Negative for eye problems and icterus.  Respiratory:  Negative for chest tightness, cough and shortness of breath.   Cardiovascular:  Negative for chest pain, leg swelling and palpitations.  Gastrointestinal:  Negative for abdominal distention, abdominal pain, constipation, diarrhea, nausea and vomiting.  Endocrine: Negative for hot flashes.  Genitourinary:  Negative for difficulty urinating.   Musculoskeletal:  Negative for arthralgias.  Skin:  Negative for itching and rash.  Neurological:  Positive for numbness. Negative for dizziness, extremity weakness and headaches.  Hematological:  Negative for adenopathy. Does not bruise/bleed easily.  Psychiatric/Behavioral:  Negative for depression. The patient is not nervous/anxious.      COVID 19 VACCINATION STATUS: [place] x2, most recently 11/2019; infection 07/2020   HISTORY OF CURRENT ILLNESS: From the original intake note:  [name] presented with a new palpable lump in the right breast. She underwent bilateral diagnostic mammography with tomography and right breast ultrasonography at [facility] on 11/06/2020 showing: breast density category B; 1.9 cm mass at palpable site of concern in right breast at 10 o'clock; 0.8 cm smaller mass in right breast at 9 o'clock; 0.7 cm small mass in right axilla, possibly a replaced lymph node; second mildly suspicious lymph node in axilla.  Accordingly on 11/08/2020 she  proceeded to biopsy of the right breast area in question. The pathology from this procedure ([id number]) showed:  1. Right Breast, 10 o'clock - invasive ductal carcinoma, grade 3  - Prognostic indicators significant for: estrogen receptor, 5% "positive "with weak staining  intensity (this is functionally negative) and progesterone receptor, 0% negative. Proliferation marker Ki67 at 30%. HER2 equivocal by immunohistochemistry (2+), but negative by fluorescent in situ hybridization with a signals ratio 1.19 and number per cell 1.6. 2. Right Breast, 9 o'clock  - fibrocystic change 3. Right Axilla, lymph node  - metastatic carcinoma involving a lymph node  Cancer Staging Malignant neoplasm of upper-outer quadrant of right breast in female, estrogen receptor negative ([place]) Staging form: Breast, AJCC 8th Edition - Clinical: Stage IIB (cT1c, cN1, cM0, G3, ER-, PR-, HER2-) - Signed by [name], MD on 11/20/2020 Histologic grading system: 3 grade system  The patient's subsequent history is as detailed below.   PAST MEDICAL HISTORY: Past Medical History:  Diagnosis Date   Abdominal pain, generalized 12/27/2008   Acute sinusitis, unspecified 02/26/2007   ADD 03/13/2010   ALLERGIC RHINITIS 02/23/2007   ANEMIA 02/05/2007   Anxiety 11/17/2011   ATTENTION DEFICIT DISORDER, HX OF 02/05/2007   Breast cancer ([place])    Deviated nasal septum 02/05/2007   Diabetes mellitus without complication ([place])    Family history of breast cancer 11/20/2020   Family history of melanoma 11/20/2020   FATIGUE 02/23/2007   HYPERLIPIDEMIA 02/23/2007   Personal history of malignant melanoma 11/20/2020   SWELLING MASS OR LUMP IN HEAD AND NECK 07/27/2008   UTI 02/23/2007    PAST SURGICAL HISTORY: Past Surgical History:  Procedure Laterality Date   BROW LIFT     CESAREAN [place]   Nasal   MELANOMA EXCISION WITH SENTINEL LYMPH NODE BIOPSY Left 10/24/2012   Procedure: MELANOMA wide EXCISION left lateral thigh WITH SENTINEL LYMPH NODE BIOPSY left groin;  Surgeon: Edward Jolly, MD;  Location: [facility];  Service: General;  Laterality: Left;   NASAL SEPTUM SURGERY     PORTACATH PLACEMENT Right 12/02/2020   Procedure: INSERTION  PORT-A-CATH;  Surgeon: Donnie Mesa, MD;  Location: [facility];  Service: General;  Laterality: Right;    FAMILY HISTORY: Family History  Problem Relation Age of Onset   Hypertension Mother    Breast cancer Mother 40   COPD Father    Atrial fibrillation Father    Atrial fibrillation Sister    Kidney cancer Maternal Aunt        dx 66s   Leukemia Paternal Aunt        dx after 67   Cancer Paternal Aunt        unknown type; dx after 3   Brain cancer Paternal Grandfather 73       brain tumor per pt   Melanoma Cousin        arm; no mets per pt   Alcohol abuse Other    Depression Other    Glaucoma Other    Breast cancer Other        MGM's sisters, x3, dx after 8   Colon polyps Neg Hx    Her father died at age 2 from COPD. Her mother is currently 83 years old as of 11/2020. Amberrose has no full siblings and three half-siblings-- two brothers and one sister. She reports breast cancer in her mother and three great-aunts, brain tumor in her paternal grandfather,  and metastatic melanoma in a cousin.   GYNECOLOGIC HISTORY:  Patient's last menstrual period was 04/27/2016. Menarche: [age] years old Age at first live birth: 55 years old [place] P 2 LMP 2016 Contraceptive used for 1-2 years HRT estrogen cream irregularly Hysterectomy? no BSO? 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 She lives in [place], [place] but travels here frequently for work. Husband [name] works in Press photographer for [employer]. She lives at home with [name] and their daughter [name], who is 40 and is a Ship broker at [employer]. Daughter [name], age 62, is a Secretary/administrator in [place]. [place]. 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    ADVANCED DIRECTIVES: In the absence of any documentation to the contrary, the patient's spouse is their HCPOA.    HEALTH MAINTENANCE: Social History   Tobacco Use   Smoking status: Never   Smokeless  tobacco: Never  Substance Use Topics   Alcohol use: Yes    Comment: Very rare occasions   Drug use: Never     Colonoscopy: 12/2014 (Dr. Henrene Pastor)  PAP: 2020  Bone density: unsure   Allergies  Allergen Reactions   Promethazine Hcl     Legs twitch    Current Outpatient Medications  Medication Sig Dispense Refill   atorvastatin (LIPITOR) 20 MG tablet Take 1 tablet (20 mg total) by mouth daily. 90 tablet 3   Cholecalciferol (THERA-D 2000) 50 MCG (2000 UT) TABS 1 tab by mouth once daily 30 tablet 99   dexamethasone (DECADRON) 4 MG tablet Take 2 tablets once a day for 3 days after carboplatin and AC chemotherapy. Take with food. 30 tablet 1   Emollient (ROC RETINOL CORREXION EYE) CREA Use as directed daily 15 mL 11   fluconazole (DIFLUCAN) 100 MG tablet Take 1 tablet (100 mg total) by mouth daily. 60 tablet 1   lidocaine-prilocaine (EMLA) cream Apply to affected area once 30 g 3   metFORMIN (GLUCOPHAGE-XR) 500 MG 24 hr tablet Take 1 tablet (500 mg total) by mouth daily with breakfast. 90 tablet 3   prochlorperazine (COMPAZINE) 10 MG tablet Take 1 tablet (10 mg total) by mouth every 6 (six) hours as needed (Nausea or vomiting). 30 tablet 1   valACYclovir (VALTREX) 500 MG tablet Take 1 tablet (500 mg total) by mouth 2 (two) times daily. 60 tablet 1   zolpidem (AMBIEN) 5 MG tablet Take 1 tablet (5 mg total) by mouth at bedtime as needed for sleep. 20 tablet 0   No current facility-administered medications for this visit.    OBJECTIVE:   Vitals:   01/14/21 0851  BP: 126/67  Pulse: 81  Temp: 98.1 F (36.7 C)  SpO2: 100%      Body mass index is 25.81 kg/m.   Wt Readings from Last 3 Encounters:  01/14/21 159 lb 14.4 oz (72.5 kg)  01/07/21 160 lb 4 oz (72.7 kg)  12/23/20 158 lb 8 oz (71.9 kg)     ECOG FS:1 - Symptomatic but completely ambulatory GENERAL: Patient is a well appearing female in no acute distress HEENT:  Sclerae anicteric.  Ulcerations noted on inner buccal mucosa, no  thrush noted, lips are slightly swollen. Neck is supple.  NODES:  No cervical, supraclavicular, or axillary lymphadenopathy palpated.  BREAST EXAM:  no progression noted LUNGS:  Clear to auscultation bilaterally.  No wheezes or rhonchi. HEART:  Regular rate and rhythm. No murmur appreciated. ABDOMEN:  Soft, nontender.  Positive,  normoactive bowel sounds. No organomegaly palpated. MSK:  No focal spinal tenderness to palpation. Full range of motion bilaterally in the upper extremities. EXTREMITIES:  No peripheral edema.   SKIN:  Clear with no obvious rashes or skin changes. No nail dyscrasia. NEURO:  Nonfocal. Well oriented.  Appropriate affect.   LAB RESULTS:  CMP     Component Value Date/Time   NA 140 01/07/2021 1210   K 4.2 01/07/2021 1210   CL 105 01/07/2021 1210   CO2 24 01/07/2021 1210   GLUCOSE 109 (H) 01/07/2021 1210   BUN 12 01/07/2021 1210   CREATININE 0.85 01/07/2021 1210   CALCIUM 9.8 01/07/2021 1210   PROT 6.9 01/07/2021 1210   ALBUMIN 3.8 01/07/2021 1210   AST 20 01/07/2021 1210   ALT 28 01/07/2021 1210   ALKPHOS 93 01/07/2021 1210   BILITOT 0.3 01/07/2021 1210   GFRNONAA >60 01/07/2021 1210   GFRAA 100 03/23/2008 1212    No results found for: TOTALPROTELP, ALBUMINELP, A1GS, A2GS, BETS, BETA2SER, GAMS, MSPIKE, SPEI  Lab Results  Component Value Date   WBC 5.7 01/14/2021   NEUTROABS 2.7 01/14/2021   HGB 11.6 (L) 01/14/2021   HCT 34.5 (L) 01/14/2021   MCV 92.0 01/14/2021   PLT 163 01/14/2021    No results found for: LABCA2  No components found for: DJMEQA834  No results for input(s): INR in the last 168 hours.  No results found for: LABCA2  No results found for: HDQ222  No results found for: LNL892  No results found for: JJH417  No results found for: CA2729  No components found for: HGQUANT  No results found for: CEA1 / No results found for: CEA1   No results found for: AFPTUMOR  No results found for: CHROMOGRNA  No results found for:  KPAFRELGTCHN, LAMBDASER, KAPLAMBRATIO (kappa/lambda light chains)  No results found for: HGBA, HGBA2QUANT, HGBFQUANT, HGBSQUAN (Hemoglobinopathy evaluation)   No results found for: LDH  Lab Results  Component Value Date   IRON 93 12/16/2018   IRONPCTSAT 25.6 12/16/2018   (Iron and TIBC)  No results found for: FERRITIN  Urinalysis    Component Value Date/Time   COLORURINE YELLOW 12/20/2019 1521   APPEARANCEUR CLEAR 12/20/2019 1521   LABSPEC 1.017 12/20/2019 1521   PHURINE 6.5 12/20/2019 1521   GLUCOSEU NEGATIVE 12/20/2019 1521   GLUCOSEU NEGATIVE 12/16/2018 1512   HGBUR NEGATIVE 12/20/2019 1521   BILIRUBINUR NEGATIVE 12/16/2018 1512   KETONESUR NEGATIVE 12/20/2019 1521   PROTEINUR NEGATIVE 12/20/2019 1521   UROBILINOGEN 0.2 12/16/2018 1512   NITRITE NEGATIVE 12/20/2019 1521   LEUKOCYTESUR NEGATIVE 12/20/2019 1521    STUDIES: No results found.   ELIGIBLE FOR AVAILABLE RESEARCH PROTOCOL: no  ASSESSMENT: 55 y.o. [place] woman status post right breast upper outer quadrant biopsy 11/08/2020 for a clinical T1c N1, stage IIB invasive ductal carcinoma, grade 3, functionally triple negative, with an MIB-1 of 30%  (A) right breast retroareolar biopsy 12/09/2020 shows invasive ductal carcinoma, grade 2  (1) genetics test 12/03/2020 through the [place] +RNAinsight Panel found no deleterious mutations in AIP, ALK, APC, ATM, AXIN2, BAP1, BARD1, BLM, BMPR1A, BRCA1, BRCA2, BRIP1, CDC73, CDH1, CDK4, CDKN1B, CDKN2A, CHEK2, CTNNA1, DICER1, FANCC, FH, FLCN, GALNT12, KIF1B, LZTR1, MAX, MEN1, MET, MLH1, MSH2, MSH3, MSH6, MUTYH, NBN, NF1, NF2, NTHL1, PALB2, PHOX2B, PMS2, POT1, PRKAR1A, PTCH1, PTEN, RAD51C, RAD51D, RB1, RECQL, RET, SDHA, SDHAF2, SDHB, SDHC, SDHD, SMAD4, SMARCA4, SMARCB1, SMARCE1, STK11, SUFU, TMEM127, TP53, TSC1, TSC2, VHL and XRCC2 (sequencing and deletion/duplication); EGFR, EGLN1, HOXB13,  KIT, MITF, PDGFRA, POLD1, and POLE (sequencing only); EPCAM and  GREM1 (deletion/duplication only).    (2) neoadjuvant chemotherapy to consist of carboplatin and paclitaxel with pembrolizumab every 21 days x 4 starting 12/03/2020, to be followed by doxorubicin and cyclophosphamide with pembrolizumab every 21 days x 4  (3) definitive surgery to follow  (4) adjuvant radiation   PLAN: Adina is here today prior to receiving cycle 3 day 15 of Pembrolizumab, Carboplatin, and Paclitaxel. She is suffering from grade 2 mucositis.  She is getting by with a modified diet, and is taking in enough to not have a weight loss, or kidney injury with creatinine increase.  I have increased her valtrex dosage, refilled her Fluconazole, and called in magic mouthwash to the pharmacy.    She met with Dr. Jana Hakim today to discuss the warmth in her feet.  This is early peripheral neuropathy.  Due to this, she will stop receiving the Paclitaxel portion of her chemotherapy, this means that she will not receive chemotherapy today.  Instead, she will return next week for labs, f/u, and to start Doxorubicin, Cyclophosphamide, and Pembrolizumab every 3 weeks.  She will receive Udencya 2 days after each cycle and I requested her appointments be updated.  WE will see Elfreda back next week.  She has an appt with Dr. Jana Hakim this day and he will review her anti nausea medication changes for her upcoming cycle.  She knows to call for any questions that may arise between now and her next appointment.  We are happy to see her sooner if needed.  Total encounter time 30 minutes* in face to face visit time, chart review, lab review, care coordination, order entry, and documentation of the encounter.   Wilber Bihari, NP 01/14/21 8:59 AM Medical Oncology and Hematology [facility] [place], [place] [address] Tel. [phone]    Fax. 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  If we continue as we are doing we are likely to cause permanent damage and possible disability.  Accordingly we are stopping the taxane and carboplatin portion of her treatment.  When she returns next week she will receive her first of 4 planned doses of doxorubicin and cyclophosphamide.  I do not anticipate adding the final cycle of carboplatin/paclitaxel at the end of the [facility] doses unless there is complete resolution of the neuropathy and the post treatment MRI shows residual disease.  Shayonna has a good understanding of this plan and is in agreement with it.  I personally saw this patient and performed a substantive portion of this encounter with the listed APP documented above.   [name], MD Medical Oncology and Hematology [facility] [address] [place], [place] [address] Tel. [phone]    Fax. [phone]    *Total Encounter Time as defined by the Centers for Medicare and Medicaid Services includes, in addition to the face-to-face time of a patient visit (documented in the note above) non-face-to-face time: obtaining and reviewing outside history, ordering and reviewing medications, tests or procedures, care coordination (communications with other health care professionals or caregivers) and documentation in the medical record.

## 2021-01-29 ENCOUNTER — Encounter: Payer: Self-pay | Admitting: Adult Health

## 2021-01-29 ENCOUNTER — Other Ambulatory Visit (HOSPITAL_COMMUNITY): Payer: Self-pay

## 2021-01-29 ENCOUNTER — Inpatient Hospital Stay: Payer: Managed Care, Other (non HMO)

## 2021-01-30 ENCOUNTER — Other Ambulatory Visit (HOSPITAL_COMMUNITY): Payer: Self-pay

## 2021-01-30 ENCOUNTER — Other Ambulatory Visit: Payer: Self-pay | Admitting: *Deleted

## 2021-01-30 ENCOUNTER — Inpatient Hospital Stay: Payer: Managed Care, Other (non HMO)

## 2021-01-30 DIAGNOSIS — Z171 Estrogen receptor negative status [ER-]: Secondary | ICD-10-CM

## 2021-01-30 DIAGNOSIS — C50411 Malignant neoplasm of upper-outer quadrant of right female breast: Secondary | ICD-10-CM

## 2021-01-30 MED ORDER — VALACYCLOVIR HCL 1 G PO TABS: 1000.0000 mg | ORAL_TABLET | Freq: Two times a day (BID) | ORAL | 1 refills | Status: DC

## 2021-01-30 MED ORDER — FLUCONAZOLE 100 MG PO TABS: 100.0000 mg | ORAL_TABLET | Freq: Every day | ORAL | 1 refills | Status: DC

## 2021-01-31 ENCOUNTER — Inpatient Hospital Stay: Payer: Managed Care, Other (non HMO)

## 2021-02-03 MED FILL — Fosaprepitant Dimeglumine For IV Infusion 150 MG (Base Eq): INTRAVENOUS | Qty: 5 | Status: AC

## 2021-02-03 MED FILL — Dexamethasone Sodium Phosphate Inj 100 MG/10ML: INTRAMUSCULAR | Qty: 1 | Status: AC

## 2021-02-03 NOTE — Progress Notes (Signed)
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  She is accompanied by her husband  She began neoadjuvant chemotherapy, consisting of carboplatin, taxol, and keytruda, on 12/03/2020. After cycle 3, she noticed increased neuropathy. Accordingly, we stopped the taxane and carboplatin portion of her treatment. She will receive her first of 4 planned doses of doxorubicin and cyclophosphamide today.  She had an echo 11/26/2020 showing a ventricular ejection fraction of 64% with no regional wall motion abnormalities  We are following her TSH since she is receiving Pembrolizumab, levels are noted below.  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  She does have some [place] fasciitis on the left foot.  There is currently no numbness or tingling there and she never had any in her hands.  Her mouth sores have also resolved.  She still has a little bit of coating on the tip of her tongue she says she was slightly constipated and she resolved that with taking extra fiber and hydrating herself.  This weekend she  spent quite a bit of time hanging Christmas decorations.  A detailed review of systems today was otherwise stable   COVID 19 VACCINATION STATUS: [place] x2, most recently 11/2019; infection 07/2020   HISTORY OF CURRENT ILLNESS: From the original intake note:  Robin Farley presented with a new palpable lump in the right breast. She underwent bilateral diagnostic mammography with tomography and right breast ultrasonography at [facility] on 11/06/2020 showing: breast density category B; 1.9 cm mass at palpable site of concern in right breast at 10 o'clock; 0.8 cm smaller mass in right breast at 9 o'clock; 0.7 cm small mass in right axilla, possibly a replaced lymph node; second mildly suspicious lymph node in axilla.  Accordingly on 11/08/2020 she proceeded to biopsy of the right breast area in question. The pathology from this procedure ([id number]) showed:  1. Right Breast, 10 o'clock - invasive ductal carcinoma, grade 3  - Prognostic indicators significant for: estrogen receptor, 5% "positive "with weak staining intensity (this is functionally negative) and progesterone receptor, 0% negative. Proliferation marker Ki67 at 30%. HER2 equivocal by immunohistochemistry (2+), but negative by fluorescent in situ hybridization with a signals ratio 1.19 and number per cell 1.6. 2. Right Breast, 9 o'clock  - fibrocystic change 3. Right Axilla, lymph node  - metastatic carcinoma involving a lymph node  Cancer Staging Malignant neoplasm of upper-outer quadrant of right breast in female, estrogen receptor negative ([place]) Staging form: Breast, AJCC 8th Edition - Clinical: Stage IIB (cT1c, cN1, cM0, G3, ER-, PR-, HER2-) - Signed by [name], MD on 11/20/2020 Histologic grading system: 3 grade system  The patient's subsequent history is as detailed below.   PAST MEDICAL HISTORY: Past Medical History:  Diagnosis Date   Abdominal pain, generalized 12/27/2008   Acute sinusitis, unspecified  02/26/2007   ADD 03/13/2010   ALLERGIC RHINITIS 02/23/2007   ANEMIA 02/05/2007   Anxiety 11/17/2011   ATTENTION DEFICIT DISORDER, HX OF 02/05/2007   Breast cancer ([place])    Deviated nasal septum 02/05/2007   Diabetes mellitus without complication ([place])    Family history of breast cancer 11/20/2020   Family history of melanoma 11/20/2020   FATIGUE 02/23/2007   HYPERLIPIDEMIA 02/23/2007   Personal history of malignant melanoma 11/20/2020   SWELLING MASS OR LUMP IN HEAD AND NECK 07/27/2008   UTI 02/23/2007    PAST SURGICAL HISTORY: Past Surgical History:  Procedure Laterality Date   BROW LIFT     CESAREAN [place]   Nasal   MELANOMA EXCISION WITH SENTINEL LYMPH NODE BIOPSY Left 10/24/2012   Procedure: MELANOMA wide EXCISION left lateral thigh WITH SENTINEL LYMPH NODE BIOPSY left groin;  Surgeon: Edward Jolly, MD;  Location: [facility];  Service: General;  Laterality: Left;   NASAL SEPTUM SURGERY     PORTACATH PLACEMENT Right 12/02/2020   Procedure: INSERTION PORT-A-CATH;  Surgeon: Donnie Mesa, MD;  Location: [facility];  Service: General;  Laterality: Right;    FAMILY HISTORY: Family History  Problem Relation Age of Onset   Hypertension Mother    Breast cancer Mother 35   COPD Father    Atrial fibrillation Father    Atrial fibrillation Sister    Kidney cancer Maternal Aunt        dx 10s   Leukemia Paternal Aunt        dx after 43   Cancer Paternal Aunt        unknown type; dx after 25   Brain cancer Paternal Grandfather 73       brain tumor per pt   Melanoma Cousin        arm; no mets per pt   Alcohol abuse Other    Depression Other    Glaucoma Other    Breast cancer Other        MGM's sisters, x3, dx after 11   Colon polyps Neg Hx    Her father died at age 39 from COPD. Her mother is currently 33 years old as of 11/2020. Robin Farley has no full siblings and three half-siblings-- two brothers and one  sister. She reports breast cancer in her mother and three great-aunts, brain tumor in her paternal grandfather, and metastatic melanoma in a cousin.   GYNECOLOGIC HISTORY:  Patient's last menstrual period was 04/27/2016. Menarche: [age] years old Age at first live birth: 55 years old [place] P 2 LMP 2016 Contraceptive used for 1-2 years HRT estrogen cream irregularly Hysterectomy? no BSO? 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 She lives in [place], [place] but travels here frequently for work. Husband Robin Farley works in Press photographer for [employer]. She lives at home with Robin Farley and their daughter Robin Farley, who is 28 and is a Ship broker at [employer]. Daughter Robin Farley, age 23, is a Secretary/administrator in [place]. Robin Farley, . 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    ADVANCED DIRECTIVES: In the absence of any documentation to the contrary, the patient's spouse is their HCPOA.    HEALTH MAINTENANCE: Social History  Tobacco Use   Smoking status: Never   Smokeless tobacco: Never  Substance Use Topics   Alcohol use: Yes    Comment: Very rare occasions   Drug use: Never     Colonoscopy: 12/2014 (Dr. Marina Goodell)  PAP: 2020  Bone density: unsure   Allergies  Allergen Reactions   Promethazine Hcl     Legs twitch    Current Outpatient Medications  Medication Sig Dispense Refill   atorvastatin (LIPITOR) 20 MG tablet Take 1 tablet (20 mg total) by mouth daily. 90 tablet 3   Cholecalciferol (THERA-D 2000) 50 MCG (2000 UT) TABS 1 tab by mouth once daily 30 tablet 99   dexamethasone (DECADRON) 4 MG tablet Take 2 tablets once a day for 3 days after carboplatin and AC chemotherapy. Take with food. 30 tablet 1   Emollient (ROC RETINOL CORREXION EYE) CREA Use as directed daily 15 mL 11   fluconazole (DIFLUCAN) 100 MG tablet Take 1 tablet by mouth daily. 30 tablet 1   lidocaine-prilocaine (EMLA) cream Apply to affected area once 30 g 3   magic  mouthwash (nystatin, lidocaine, diphenhydrAMINE, alum & mag hydroxide) suspension Swish 5 mLs and spit out 4 times a day as needed before meals and at bedtime. 240 mL 0   metFORMIN (GLUCOPHAGE-XR) 500 MG 24 hr tablet Take 1 tablet (500 mg total) by mouth daily with breakfast. 90 tablet 3   prochlorperazine (COMPAZINE) 10 MG tablet Take 1 tablet (10 mg total) by mouth every 6 (six) hours as needed (Nausea or vomiting). 30 tablet 1   valACYclovir (VALTREX) 1000 MG tablet Take 1 tablet by mouth 2 times daily. 60 tablet 1   zolpidem (AMBIEN) 5 MG tablet Take 1 tablet (5 mg total) by mouth at bedtime as needed for sleep. 20 tablet 0   No current facility-administered medications for this visit.   Facility-Administered Medications Ordered in Other Visits  Medication Dose Route Frequency Provider Last Rate Last Admin   sodium chloride flush (NS) 0.9 % injection 10 mL  10 mL Intracatheter PRN Lilyrose Tanney, Valentino Hue, MD   10 mL at 01/21/21 1144    OBJECTIVE: White woman who appears younger than stated age  Vitals:   02/04/21 0813  BP: 130/76  Pulse: 87  Resp: 18  Temp: 98.1 F (36.7 C)  SpO2: 97%      Body mass index is 26.15 kg/m.   Wt Readings from Last 3 Encounters:  02/04/21 162 lb (73.5 kg)  01/28/21 161 lb 8 oz (73.3 kg)  01/21/21 159 lb (72.1 kg)     ECOG FS:1 - Symptomatic but completely ambulatory  Sclerae unicteric, EOMs intact Wearing a mask No cervical or supraclavicular adenopathy Lungs no rales or rhonchi Heart regular rate and rhythm Abd soft, nontender, positive bowel sounds MSK no focal spinal tenderness, no upper extremity lymphedema Neuro: nonfocal, well oriented, appropriate affect Breasts: I do not palpate a mass in the right breast or right axilla.  The left breast and left axilla are benign.   LAB RESULTS:  CMP     Component Value Date/Time   NA 143 01/28/2021 0813   K 3.7 01/28/2021 0813   CL 108 01/28/2021 0813   CO2 24 01/28/2021 0813   GLUCOSE 101  (H) 01/28/2021 0813   BUN 16 01/28/2021 0813   CREATININE 0.78 01/28/2021 0813   CALCIUM 9.4 01/28/2021 0813   PROT 6.6 01/28/2021 0813   ALBUMIN 3.4 (L) 01/28/2021 0813   AST 16 01/28/2021 0813  ALT 42 01/28/2021 0813   ALKPHOS 80 01/28/2021 0813   BILITOT 0.3 01/28/2021 0813   GFRNONAA >60 01/28/2021 0813   GFRAA 100 03/23/2008 1212    No results found for: TOTALPROTELP, ALBUMINELP, A1GS, A2GS, BETS, BETA2SER, GAMS, MSPIKE, SPEI  Lab Results  Component Value Date   WBC 4.1 02/04/2021   NEUTROABS 2.2 02/04/2021   HGB 11.2 (L) 02/04/2021   HCT 32.3 (L) 02/04/2021   MCV 91.5 02/04/2021   PLT 222 02/04/2021    No results found for: LABCA2  No components found for: MCNOBS962  No results for input(s): INR in the last 168 hours.  No results found for: LABCA2  No results found for: EZM629  No results found for: UTM546  No results found for: TKP546  No results found for: CA2729  No components found for: HGQUANT  No results found for: CEA1 / No results found for: CEA1   No results found for: AFPTUMOR  No results found for: CHROMOGRNA  No results found for: KPAFRELGTCHN, LAMBDASER, KAPLAMBRATIO (kappa/lambda light chains)  No results found for: HGBA, HGBA2QUANT, HGBFQUANT, HGBSQUAN (Hemoglobinopathy evaluation)   No results found for: LDH  Lab Results  Component Value Date   IRON 93 12/16/2018   IRONPCTSAT 25.6 12/16/2018   (Iron and TIBC)  No results found for: FERRITIN  Urinalysis    Component Value Date/Time   COLORURINE YELLOW 12/20/2019 1521   APPEARANCEUR CLEAR 12/20/2019 1521   LABSPEC 1.017 12/20/2019 1521   PHURINE 6.5 12/20/2019 1521   GLUCOSEU NEGATIVE 12/20/2019 1521   GLUCOSEU NEGATIVE 12/16/2018 1512   HGBUR NEGATIVE 12/20/2019 1521   BILIRUBINUR NEGATIVE 12/16/2018 1512   KETONESUR NEGATIVE 12/20/2019 1521   PROTEINUR NEGATIVE 12/20/2019 1521   UROBILINOGEN 0.2 12/16/2018 1512   NITRITE NEGATIVE 12/20/2019 1521   LEUKOCYTESUR  NEGATIVE 12/20/2019 1521    STUDIES: No results found.   ELIGIBLE FOR AVAILABLE RESEARCH PROTOCOL: no  ASSESSMENT: 55 y.o. [place]  woman status post right breast upper outer quadrant biopsy 11/08/2020 for a clinical T1c N1, stage IIB invasive ductal carcinoma, grade 3, functionally triple negative, with an MIB-1 of 30%  (A) right breast retroareolar biopsy 12/09/2020 shows invasive ductal carcinoma, grade 2  (1) genetics test 12/03/2020 through the [place] +RNAinsight Panel found no deleterious mutations in AIP, ALK, APC, ATM, AXIN2, BAP1, BARD1, BLM, BMPR1A, BRCA1, BRCA2, BRIP1, CDC73, CDH1, CDK4, CDKN1B, CDKN2A, CHEK2, CTNNA1, DICER1, FANCC, FH, FLCN, GALNT12, KIF1B, LZTR1, MAX, MEN1, MET, MLH1, MSH2, MSH3, MSH6, MUTYH, NBN, NF1, NF2, NTHL1, PALB2, PHOX2B, PMS2, POT1, PRKAR1A, PTCH1, PTEN, RAD51C, RAD51D, RB1, RECQL, RET, SDHA, SDHAF2, SDHB, SDHC, SDHD, SMAD4, SMARCA4, SMARCB1, SMARCE1, STK11, SUFU, TMEM127, TP53, TSC1, TSC2, VHL and XRCC2 (sequencing and deletion/duplication); EGFR, EGLN1, HOXB13, KIT, MITF, PDGFRA, POLD1, and POLE (sequencing only); EPCAM and GREM1 (deletion/duplication only).    (2) neoadjuvant chemotherapy consisting of carboplatin and paclitaxel with pembrolizumab every 21 days x4starting 12/03/2020, followed by doxorubicin and cyclophosphamide with pembrolizumab every 21 days x 4 starting 02/05/2019  (A) fourth carboplatin/paclitaxel cycle omitted secondary to neuropathy  (3) definitive surgery to follow  (4) adjuvant radiation   PLAN: Navea developed some neuropathy with the paclitaxel portion of her treatment but she was able to receive 3 cycles without significant modifications and thankfully her neuropathy already seems to be largely resolved.  She will receive her first AC dose today.  I gave her a routing map on how to take her supportive medications and explained how she should read the map.  Have asked  her to keep a symptom diary so  when she returns in 1week we will be able to troubleshoot problems before her additional AC cycles.  Once she completes her treatment of course she will have a repeat breast MRI and proceed to surgery  She knows to call us for any other issue that may develop before the next visit  Total encounter time 25 minutes.Sarajane Jews C. Colleen Donahoe, MD 02/04/21 8:20 AM Medical Oncology and Hematology [facility] [address], [place] [address] Tel. [phone]    Fax. [phone]   I, Wilburn Mylar, am acting as scribe for Dr. Virgie Dad. Padraic Marinos.  I, Lurline Del MD, have reviewed the above documentation for accuracy and completeness, and I agree with the above.    *Total Encounter Time as defined by the Centers for Medicare and Medicaid Services includes, in addition to the face-to-face time of a patient visit (documented in the note above) non-face-to-face time: obtaining and reviewing outside history, ordering and reviewing medications, tests or procedures, care coordination (communications with other health care professionals or caregivers) and documentation in the medical record.

## 2021-02-04 ENCOUNTER — Inpatient Hospital Stay: Payer: Managed Care, Other (non HMO)

## 2021-02-04 ENCOUNTER — Inpatient Hospital Stay (HOSPITAL_BASED_OUTPATIENT_CLINIC_OR_DEPARTMENT_OTHER): Payer: Managed Care, Other (non HMO) | Admitting: Oncology

## 2021-02-04 ENCOUNTER — Encounter: Payer: Self-pay | Admitting: *Deleted

## 2021-02-04 ENCOUNTER — Other Ambulatory Visit: Payer: Self-pay

## 2021-02-04 ENCOUNTER — Encounter: Payer: Self-pay | Admitting: Oncology

## 2021-02-04 VITALS — BP 130/76 | HR 87 | Temp 98.1°F | Resp 18 | Ht 66.0 in | Wt 162.0 lb

## 2021-02-04 DIAGNOSIS — Z171 Estrogen receptor negative status [ER-]: Secondary | ICD-10-CM | POA: Diagnosis not present

## 2021-02-04 DIAGNOSIS — Z95828 Presence of other vascular implants and grafts: Secondary | ICD-10-CM

## 2021-02-04 DIAGNOSIS — C50411 Malignant neoplasm of upper-outer quadrant of right female breast: Secondary | ICD-10-CM | POA: Diagnosis not present

## 2021-02-04 DIAGNOSIS — Z5112 Encounter for antineoplastic immunotherapy: Secondary | ICD-10-CM | POA: Diagnosis not present

## 2021-02-04 LAB — CMP (CANCER CENTER ONLY)
ALT: 54 U/L — ABNORMAL HIGH (ref 0–44)
AST: 18 U/L (ref 15–41)
Albumin: 3.4 g/dL — ABNORMAL LOW (ref 3.5–5.0)
Alkaline Phosphatase: 97 U/L (ref 38–126)
Anion gap: 9 (ref 5–15)
BUN: 11 mg/dL (ref 6–20)
CO2: 24 mmol/L (ref 22–32)
Calcium: 9.5 mg/dL (ref 8.9–10.3)
Chloride: 111 mmol/L (ref 98–111)
Creatinine: 0.75 mg/dL (ref 0.44–1.00)
GFR, Estimated: >60 mL/min (ref >60)
Glucose, Bld: 88 mg/dL (ref 70–99)
Potassium: 4.1 mmol/L (ref 3.5–5.1)
Sodium: 144 mmol/L (ref 135–145)
Total Bilirubin: 0.3 mg/dL (ref 0.3–1.2)
Total Protein: 6.8 g/dL (ref 6.5–8.1)

## 2021-02-04 LAB — CBC WITH DIFFERENTIAL (CANCER CENTER ONLY)
Abs Immature Granulocytes: 0.01 10*3/uL (ref 0.00–0.07)
Basophils Absolute: 0 10*3/uL (ref 0.0–0.1)
Basophils Relative: 1 %
Eosinophils Absolute: 0 10*3/uL (ref 0.0–0.5)
Eosinophils Relative: 1 %
HCT: 32.3 % — ABNORMAL LOW (ref 36.0–46.0)
Hemoglobin: 11.2 g/dL — ABNORMAL LOW (ref 12.0–15.0)
Immature Granulocytes: 0 %
Lymphocytes Relative: 33 %
Lymphs Abs: 1.4 10*3/uL (ref 0.7–4.0)
MCH: 31.7 pg (ref 26.0–34.0)
MCHC: 34.7 g/dL (ref 30.0–36.0)
MCV: 91.5 fL (ref 80.0–100.0)
Monocytes Absolute: 0.5 10*3/uL (ref 0.1–1.0)
Monocytes Relative: 12 %
Neutro Abs: 2.2 10*3/uL (ref 1.7–7.7)
Neutrophils Relative %: 53 %
Platelet Count: 222 10*3/uL (ref 150–400)
RBC: 3.53 MIL/uL — ABNORMAL LOW (ref 3.87–5.11)
RDW: 14.9 % (ref 11.5–15.5)
WBC Count: 4.1 10*3/uL (ref 4.0–10.5)
nRBC: 0 % (ref 0.0–0.2)

## 2021-02-04 LAB — TSH: TSH: 3.95 u[IU]/mL (ref 0.308–3.960)

## 2021-02-04 MED ORDER — CYCLOPHOSPHAMIDE CHEMO INJECTION 1 GM
600.0000 mg/m2 | Freq: Once | INTRAMUSCULAR | Status: AC
  Administered 2021-02-04: 1080 mg via INTRAVENOUS
  Filled 2021-02-04: qty 54

## 2021-02-04 MED ORDER — SODIUM CHLORIDE 0.9 % IV SOLN
200.0000 mg | Freq: Once | INTRAVENOUS | Status: AC
  Administered 2021-02-04: 200 mg via INTRAVENOUS
  Filled 2021-02-04: qty 8

## 2021-02-04 MED ORDER — PALONOSETRON HCL INJECTION 0.25 MG/5ML
0.2500 mg | Freq: Once | INTRAVENOUS | Status: AC
  Administered 2021-02-04: 0.25 mg via INTRAVENOUS
  Filled 2021-02-04: qty 5

## 2021-02-04 MED ORDER — HEPARIN SOD (PORK) LOCK FLUSH 100 UNIT/ML IV SOLN
500.0000 [IU] | Freq: Once | INTRAVENOUS | Status: AC | PRN
  Administered 2021-02-04: 500 [IU]

## 2021-02-04 MED ORDER — SODIUM CHLORIDE 0.9 % IV SOLN
Freq: Once | INTRAVENOUS | Status: AC
Start: 2021-02-04 — End: 2021-02-04

## 2021-02-04 MED ORDER — SODIUM CHLORIDE 0.9 % IV SOLN
150.0000 mg | Freq: Once | INTRAVENOUS | Status: AC
  Administered 2021-02-04: 150 mg via INTRAVENOUS
  Filled 2021-02-04: qty 150

## 2021-02-04 MED ORDER — SODIUM CHLORIDE 0.9 % IV SOLN
10.0000 mg | Freq: Once | INTRAVENOUS | Status: AC
  Administered 2021-02-04: 10 mg via INTRAVENOUS
  Filled 2021-02-04: qty 10

## 2021-02-04 MED ORDER — DOXORUBICIN HCL CHEMO IV INJECTION 2 MG/ML
60.0000 mg/m2 | Freq: Once | INTRAVENOUS | Status: AC
  Administered 2021-02-04: 108 mg via INTRAVENOUS
  Filled 2021-02-04: qty 54

## 2021-02-04 MED ORDER — SODIUM CHLORIDE 0.9% FLUSH
10.0000 mL | INTRAVENOUS | Status: DC | PRN
  Administered 2021-02-04: 10 mL

## 2021-02-04 NOTE — Patient Instructions (Signed)
[facility]  Discharge Instructions: Thank you for choosing [facility] to provide your oncology and hematology care.   If you have a lab appointment with the [facility], please go directly to the [facility] and check in at the registration area.   Wear comfortable clothing and clothing appropriate for easy access to any Portacath or PICC line.   We strive to give you quality time with your provider. You may need to reschedule your appointment if you arrive late (15 or more minutes).  Arriving late affects you and other patients whose appointments are after yours.  Also, if you miss three or more appointments without notifying the office, you may be dismissed from the clinic at the provider's discretion.      For prescription refill requests, have your pharmacy contact our office and allow 72 hours for refills to be completed.    Today you received the following chemotherapy and/or immunotherapy agents: Pembrolizumab (Keytruda), Doxorubicin (Adriamycin), and Cyclophosphamide (Cytoxan).   To help prevent nausea and vomiting after your treatment, we encourage you to take your nausea medication as directed.  BELOW ARE SYMPTOMS THAT SHOULD BE REPORTED IMMEDIATELY: *FEVER GREATER THAN 100.4 F (38 C) OR HIGHER *CHILLS OR SWEATING *NAUSEA AND VOMITING THAT IS NOT CONTROLLED WITH YOUR NAUSEA MEDICATION *UNUSUAL SHORTNESS OF BREATH *UNUSUAL BRUISING OR BLEEDING *URINARY PROBLEMS (pain or burning when urinating, or frequent urination) *BOWEL PROBLEMS (unusual diarrhea, constipation, pain near the anus) TENDERNESS IN MOUTH AND THROAT WITH OR WITHOUT PRESENCE OF ULCERS (sore throat, sores in mouth, or a toothache) UNUSUAL RASH, SWELLING OR PAIN  UNUSUAL VAGINAL DISCHARGE OR ITCHING   Items with * indicate a potential emergency and should be followed up as soon as possible or go to the Emergency Department if any problems should occur.  Please show  the CHEMOTHERAPY ALERT CARD or IMMUNOTHERAPY ALERT CARD at check-in to the Emergency Department and triage nurse.  Should you have questions after your visit or need to cancel or reschedule your appointment, please contact [facility]  Dept: [phone]  and follow the prompts.  Office hours are 8:00 a.m. to 4:30 p.m. Monday - Friday. Please note that voicemails left after 4:00 p.m. may not be returned until the following business day.  We are closed weekends and major holidays. You have access to a nurse at all times for urgent questions. Please call the main number to the clinic Dept: [phone] and follow the prompts.   For any non-urgent questions, you may also contact your provider using MyChart. We now offer e-Visits for anyone 60 and older to request care online for non-urgent symptoms. For details visit [email or web address].   Also download the MyChart app! Go to the app store, search "MyChart", open the app, select [facility], and log in with your MyChart username and password.  Due to Covid, a mask is required upon entering the hospital/clinic. If you do not have a mask, one will be given to you upon arrival. For doctor visits, patients may have 1 support person aged 8 or older with them. For treatment visits, patients cannot have anyone with them due to current Covid guidelines and our immunocompromised population.   Doxorubicin injection What is this medication? DOXORUBICIN (dox oh ROO bi sin) is a chemotherapy drug. It is used to treat many kinds of cancer like leukemia, lymphoma, neuroblastoma, sarcoma, and Wilms' tumor. It is also used to treat bladder cancer, breast cancer, lung cancer, ovarian  cancer, stomach cancer, and thyroid cancer. This medicine may be used for other purposes; ask your health care provider or pharmacist if you have questions. COMMON BRAND NAME(S): Adriamycin, Adriamycin PFS, Adriamycin RDF, Rubex What should I tell my care  team before I take this medication? They need to know if you have any of these conditions: heart disease history of low blood counts caused by a medicine liver disease recent or ongoing radiation therapy an unusual or allergic reaction to doxorubicin, other chemotherapy agents, other medicines, foods, dyes, or preservatives pregnant or trying to get pregnant breast-feeding How should I use this medication? This drug is given as an infusion into a vein. It is administered in a hospital or clinic by a specially trained health care professional. If you have pain, swelling, burning or any unusual feeling around the site of your injection, tell your health care professional right away. Talk to your pediatrician regarding the use of this medicine in children. Special care may be needed. Overdosage: If you think you have taken too much of this medicine contact a poison control center or emergency room at once. NOTE: This medicine is only for you. Do not share this medicine with others. What if I miss a dose? It is important not to miss your dose. Call your doctor or health care professional if you are unable to keep an appointment. What may interact with this medication? This medicine may interact with the following medications: 6-mercaptopurine paclitaxel phenytoin St. John's Wort trastuzumab verapamil This list may not describe all possible interactions. Give your health care provider a list of all the medicines, herbs, non-prescription drugs, or dietary supplements you use. Also tell them if you smoke, drink alcohol, or use illegal drugs. Some items may interact with your medicine. What should I watch for while using this medication? This drug may make you feel generally unwell. This is not uncommon, as chemotherapy can affect healthy cells as well as cancer cells. Report any side effects. Continue your course of treatment even though you feel ill unless your doctor tells you to stop. There is  a maximum amount of this medicine you should receive throughout your life. The amount depends on the medical condition being treated and your overall health. Your doctor will watch how much of this medicine you receive in your lifetime. Tell your doctor if you have taken this medicine before. You may need blood work done while you are taking this medicine. Your urine may turn red for a few days after your dose. This is not blood. If your urine is dark or brown, call your doctor. In some cases, you may be given additional medicines to help with side effects. Follow all directions for their use. Call your doctor or health care professional for advice if you get a fever, chills or sore throat, or other symptoms of a cold or flu. Do not treat yourself. This drug decreases your body's ability to fight infections. Try to avoid being around people who are sick. This medicine may increase your risk to bruise or bleed. Call your doctor or health care professional if you notice any unusual bleeding. Talk to your doctor about your risk of cancer. You may be more at risk for certain types of cancers if you take this medicine. Do not become pregnant while taking this medicine or for 6 months after stopping it. Women should inform their doctor if they wish to become pregnant or think they might be pregnant. Men should not father a child  while taking this medicine and for 6 months after stopping it. There is a potential for serious side effects to an unborn child. Talk to your health care professional or pharmacist for more information. Do not breast-feed an infant while taking this medicine. This medicine has caused ovarian failure in some women and reduced sperm counts in some men This medicine may interfere with the ability to have a child. Talk with your doctor or health care professional if you are concerned about your fertility. This medicine may cause a decrease in Co-Enzyme Q-10. You should make sure that you get  enough Co-Enzyme Q-10 while you are taking this medicine. Discuss the foods you eat and the vitamins you take with your health care professional. What side effects may I notice from receiving this medication? Side effects that you should report to your doctor or health care professional as soon as possible: allergic reactions like skin rash, itching or hives, swelling of the face, lips, or tongue breathing problems chest pain fast or irregular heartbeat low blood counts - this medicine may decrease the number of white blood cells, red blood cells and platelets. You may be at increased risk for infections and bleeding. pain, redness, or irritation at site where injected signs of infection - fever or chills, cough, sore throat, pain or difficulty passing urine signs of decreased platelets or bleeding - bruising, pinpoint red spots on the skin, black, tarry stools, blood in the urine swelling of the ankles, feet, hands tiredness weakness Side effects that usually do not require medical attention (report to your doctor or health care professional if they continue or are bothersome): diarrhea hair loss mouth sores nail discoloration or damage nausea red colored urine vomiting This list may not describe all possible side effects. Call your doctor for medical advice about side effects. You may report side effects to FDA at 1-800-FDA-1088. Where should I keep my medication? This drug is given in a hospital or clinic and will not be stored at home. NOTE: This sheet is a summary. It may not cover all possible information. If you have questions about this medicine, talk to your doctor, pharmacist, or health care provider.  2022 Elsevier/Gold Standard (2016-11-11 11:01:26)  Cyclophosphamide Injection What is this medication? CYCLOPHOSPHAMIDE (sye kloe FOSS fa mide) is a chemotherapy drug. It slows the growth of cancer cells. This medicine is used to treat many types of cancer like lymphoma,  myeloma, leukemia, breast cancer, and ovarian cancer, to name a few. This medicine may be used for other purposes; ask your health care provider or pharmacist if you have questions. COMMON BRAND NAME(S): Cytoxan, Neosar What should I tell my care team before I take this medication? They need to know if you have any of these conditions: heart disease history of irregular heartbeat infection kidney disease liver disease low blood counts, like white cells, platelets, or red blood cells on hemodialysis recent or ongoing radiation therapy scarring or thickening of the lungs trouble passing urine an unusual or allergic reaction to cyclophosphamide, other medicines, foods, dyes, or preservatives pregnant or trying to get pregnant breast-feeding How should I use this medication? This drug is usually given as an injection into a vein or muscle or by infusion into a vein. It is administered in a hospital or clinic by a specially trained health care professional. Talk to your pediatrician regarding the use of this medicine in children. Special care may be needed. Overdosage: If you think you have taken too much of this medicine  contact a poison control center or emergency room at once. NOTE: This medicine is only for you. Do not share this medicine with others. What if I miss a dose? It is important not to miss your dose. Call your doctor or health care professional if you are unable to keep an appointment. What may interact with this medication? amphotericin B azathioprine certain antivirals for HIV or hepatitis certain medicines for blood pressure, heart disease, irregular heart beat certain medicines that treat or prevent blood clots like warfarin certain other medicines for cancer cyclosporine etanercept indomethacin medicines that relax muscles for surgery medicines to increase blood counts metronidazole This list may not describe all possible interactions. Give your health care  provider a list of all the medicines, herbs, non-prescription drugs, or dietary supplements you use. Also tell them if you smoke, drink alcohol, or use illegal drugs. Some items may interact with your medicine. What should I watch for while using this medication? Your condition will be monitored carefully while you are receiving this medicine. You may need blood work done while you are taking this medicine. Drink water or other fluids as directed. Urinate often, even at night. Some products may contain alcohol. Ask your health care professional if this medicine contains alcohol. Be sure to tell all health care professionals you are taking this medicine. Certain medicines, like metronidazole and disulfiram, can cause an unpleasant reaction when taken with alcohol. The reaction includes flushing, headache, nausea, vomiting, sweating, and increased thirst. The reaction can last from 30 minutes to several hours. Do not become pregnant while taking this medicine or for 1 year after stopping it. Women should inform their health care professional if they wish to become pregnant or think they might be pregnant. Men should not father a child while taking this medicine and for 4 months after stopping it. There is potential for serious side effects to an unborn child. Talk to your health care professional for more information. Do not breast-feed an infant while taking this medicine or for 1 week after stopping it. This medicine has caused ovarian failure in some women. This medicine may make it more difficult to get pregnant. Talk to your health care professional if you are concerned about your fertility. This medicine has caused decreased sperm counts in some men. This may make it more difficult to father a child. Talk to your health care professional if you are concerned about your fertility. Call your health care professional for advice if you get a fever, chills, or sore throat, or other symptoms of a cold or  flu. Do not treat yourself. This medicine decreases your body's ability to fight infections. Try to avoid being around people who are sick. Avoid taking medicines that contain aspirin, acetaminophen, ibuprofen, naproxen, or ketoprofen unless instructed by your health care professional. These medicines may hide a fever. Talk to your health care professional about your risk of cancer. You may be more at risk for certain types of cancer if you take this medicine. If you are going to need surgery or other procedure, tell your health care professional that you are using this medicine. Be careful brushing or flossing your teeth or using a toothpick because you may get an infection or bleed more easily. If you have any dental work done, tell your dentist you are receiving this medicine. What side effects may I notice from receiving this medication? Side effects that you should report to your doctor or health care professional as soon as possible: allergic reactions  like skin rash, itching or hives, swelling of the face, lips, or tongue breathing problems nausea, vomiting signs and symptoms of bleeding such as bloody or black, tarry stools; red or dark brown urine; spitting up blood or brown material that looks like coffee grounds; red spots on the skin; unusual bruising or bleeding from the eyes, gums, or nose signs and symptoms of heart failure like fast, irregular heartbeat, sudden weight gain; swelling of the ankles, feet, hands signs and symptoms of infection like fever; chills; cough; sore throat; pain or trouble passing urine signs and symptoms of kidney injury like trouble passing urine or change in the amount of urine signs and symptoms of liver injury like dark yellow or brown urine; general ill feeling or flu-like symptoms; light-colored stools; loss of appetite; nausea; right upper belly pain; unusually weak or tired; yellowing of the eyes or skin Side effects that usually do not require medical  attention (report to your doctor or health care professional if they continue or are bothersome): confusion decreased hearing diarrhea facial flushing hair loss headache loss of appetite missed menstrual periods signs and symptoms of low red blood cells or anemia such as unusually weak or tired; feeling faint or lightheaded; falls skin discoloration This list may not describe all possible side effects. Call your doctor for medical advice about side effects. You may report side effects to FDA at 1-800-FDA-1088. Where should I keep my medication? This drug is given in a hospital or clinic and will not be stored at home. NOTE: This sheet is a summary. It may not cover all possible information. If you have questions about this medicine, talk to your doctor, pharmacist, or health care provider.  2022 Elsevier/Gold Standard (2019-01-02 09:53:29)

## 2021-02-04 NOTE — Telephone Encounter (Signed)
No entry 

## 2021-02-06 ENCOUNTER — Inpatient Hospital Stay: Payer: Managed Care, Other (non HMO)

## 2021-02-06 ENCOUNTER — Other Ambulatory Visit: Payer: Self-pay

## 2021-02-06 VITALS — BP 145/77 | HR 77 | Temp 98.4°F | Resp 16

## 2021-02-06 DIAGNOSIS — Z171 Estrogen receptor negative status [ER-]: Secondary | ICD-10-CM

## 2021-02-06 DIAGNOSIS — Z5112 Encounter for antineoplastic immunotherapy: Secondary | ICD-10-CM | POA: Diagnosis not present

## 2021-02-06 MED ORDER — PEGFILGRASTIM-CBQV 6 MG/0.6ML ~~LOC~~ SOSY
6.0000 mg | PREFILLED_SYRINGE | Freq: Once | SUBCUTANEOUS | Status: AC
  Administered 2021-02-06: 6 mg via SUBCUTANEOUS
  Filled 2021-02-06: qty 0.6

## 2021-02-11 ENCOUNTER — Other Ambulatory Visit: Payer: Self-pay

## 2021-02-11 ENCOUNTER — Ambulatory Visit: Payer: Managed Care, Other (non HMO)

## 2021-02-11 ENCOUNTER — Other Ambulatory Visit: Payer: Self-pay | Admitting: Oncology

## 2021-02-11 ENCOUNTER — Inpatient Hospital Stay: Payer: Managed Care, Other (non HMO) | Admitting: Adult Health

## 2021-02-11 ENCOUNTER — Inpatient Hospital Stay: Payer: Managed Care, Other (non HMO) | Attending: Oncology

## 2021-02-11 ENCOUNTER — Other Ambulatory Visit (HOSPITAL_COMMUNITY): Payer: Self-pay

## 2021-02-11 ENCOUNTER — Encounter: Payer: Self-pay | Admitting: Adult Health

## 2021-02-11 ENCOUNTER — Encounter: Payer: Self-pay | Admitting: Oncology

## 2021-02-11 VITALS — BP 108/54 | HR 111 | Temp 97.5°F | Resp 18 | Ht 66.0 in | Wt 160.7 lb

## 2021-02-11 VITALS — BP 123/70 | HR 89 | Resp 18

## 2021-02-11 DIAGNOSIS — Z7952 Long term (current) use of systemic steroids: Secondary | ICD-10-CM | POA: Insufficient documentation

## 2021-02-11 DIAGNOSIS — Z5111 Encounter for antineoplastic chemotherapy: Secondary | ICD-10-CM | POA: Insufficient documentation

## 2021-02-11 DIAGNOSIS — C50411 Malignant neoplasm of upper-outer quadrant of right female breast: Secondary | ICD-10-CM | POA: Diagnosis not present

## 2021-02-11 DIAGNOSIS — Z171 Estrogen receptor negative status [ER-]: Secondary | ICD-10-CM

## 2021-02-11 DIAGNOSIS — Z5189 Encounter for other specified aftercare: Secondary | ICD-10-CM | POA: Diagnosis not present

## 2021-02-11 DIAGNOSIS — Z95828 Presence of other vascular implants and grafts: Secondary | ICD-10-CM

## 2021-02-11 DIAGNOSIS — Z5112 Encounter for antineoplastic immunotherapy: Secondary | ICD-10-CM | POA: Diagnosis not present

## 2021-02-11 DIAGNOSIS — Z79899 Other long term (current) drug therapy: Secondary | ICD-10-CM | POA: Insufficient documentation

## 2021-02-11 DIAGNOSIS — B370 Candidal stomatitis: Secondary | ICD-10-CM | POA: Insufficient documentation

## 2021-02-11 LAB — CBC WITH DIFFERENTIAL (CANCER CENTER ONLY)
Abs Immature Granulocytes: 0.01 10*3/uL (ref 0.00–0.07)
Basophils Absolute: 0 10*3/uL (ref 0.0–0.1)
Basophils Relative: 1 %
Eosinophils Absolute: 0.1 10*3/uL (ref 0.0–0.5)
Eosinophils Relative: 4 %
HCT: 31 % — ABNORMAL LOW (ref 36.0–46.0)
Hemoglobin: 10.8 g/dL — ABNORMAL LOW (ref 12.0–15.0)
Immature Granulocytes: 1 %
Lymphocytes Relative: 54 %
Lymphs Abs: 0.9 10*3/uL (ref 0.7–4.0)
MCH: 32.2 pg (ref 26.0–34.0)
MCHC: 34.8 g/dL (ref 30.0–36.0)
MCV: 92.5 fL (ref 80.0–100.0)
Monocytes Absolute: 0 10*3/uL — ABNORMAL LOW (ref 0.1–1.0)
Monocytes Relative: 1 %
Neutro Abs: 0.7 10*3/uL — ABNORMAL LOW (ref 1.7–7.7)
Neutrophils Relative %: 39 %
Platelet Count: 105 10*3/uL — ABNORMAL LOW (ref 150–400)
RBC: 3.35 MIL/uL — ABNORMAL LOW (ref 3.87–5.11)
RDW: 14.5 % (ref 11.5–15.5)
Smear Review: NORMAL
WBC Count: 1.7 10*3/uL — ABNORMAL LOW (ref 4.0–10.5)
nRBC: 0 % (ref 0.0–0.2)

## 2021-02-11 LAB — CMP (CANCER CENTER ONLY)
ALT: 17 U/L (ref 0–44)
AST: 8 U/L — ABNORMAL LOW (ref 15–41)
Albumin: 3.4 g/dL — ABNORMAL LOW (ref 3.5–5.0)
Alkaline Phosphatase: 87 U/L (ref 38–126)
Anion gap: 7 (ref 5–15)
BUN: 14 mg/dL (ref 6–20)
CO2: 26 mmol/L (ref 22–32)
Calcium: 8.8 mg/dL — ABNORMAL LOW (ref 8.9–10.3)
Chloride: 107 mmol/L (ref 98–111)
Creatinine: 0.82 mg/dL (ref 0.44–1.00)
GFR, Estimated: >60 mL/min (ref >60)
Glucose, Bld: 90 mg/dL (ref 70–99)
Potassium: 3.9 mmol/L (ref 3.5–5.1)
Sodium: 140 mmol/L (ref 135–145)
Total Bilirubin: 0.5 mg/dL (ref 0.3–1.2)
Total Protein: 6.4 g/dL — ABNORMAL LOW (ref 6.5–8.1)

## 2021-02-11 LAB — TSH: TSH: 1.474 u[IU]/mL (ref 0.308–3.960)

## 2021-02-11 MED ORDER — NYSTATIN 100000 UNIT/ML MT SUSP
OROMUCOSAL | 0 refills | Status: DC
  Filled 2021-02-11 – 2021-02-25 (×2): qty 240, 12d supply, fill #0

## 2021-02-11 MED ORDER — SODIUM CHLORIDE 0.9% FLUSH
10.0000 mL | Freq: Once | INTRAVENOUS | Status: AC | PRN
  Administered 2021-02-11: 10 mL

## 2021-02-11 MED ORDER — SODIUM CHLORIDE 0.9% FLUSH
10.0000 mL | INTRAVENOUS | Status: DC | PRN
  Administered 2021-02-11: 10 mL

## 2021-02-11 MED ORDER — SODIUM CHLORIDE 0.9 % IV SOLN: Freq: Once | INTRAVENOUS | Status: AC

## 2021-02-11 MED ORDER — HEPARIN SOD (PORK) LOCK FLUSH 100 UNIT/ML IV SOLN
500.0000 [IU] | Freq: Once | INTRAVENOUS | Status: AC | PRN
  Administered 2021-02-11: 500 [IU]

## 2021-02-11 MED ORDER — FLUCONAZOLE 200 MG PO TABS: 200.0000 mg | ORAL_TABLET | Freq: Every day | ORAL | 1 refills | Status: DC

## 2021-02-11 NOTE — Progress Notes (Signed)
EKG order placed per NP

## 2021-02-11 NOTE — Progress Notes (Signed)
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  She is accompanied by her husband and daughter.  She is receiving neoadjuvant chemotherapy and is currently receiving Doxorubicin, Cyclophosphamide and Pembrolizumab every 21 days x 4 cycles.  Today is cycle 1 day 8 of therapy.  Robin Farley is feeling unwell today.  She is increasingly tired.  She notes that thrush has been a problem this past week.  She has been doing her best to eat and drink but just simply has difficulty eating because of the thrush.  When she came in today there was some question as to what her heart rate was it was at 1 point read on the machine is 200.  She did have an EKG that demonstrated sinus tachycardia at 102.  She is not having any new pain.  She does believe she would benefit from IV fluids today.  She had an echo 11/26/2020 showing a ventricular ejection fraction of 64% with no regional wall motion abnormalities  We are following her TSH since she is receiving Pembrolizumab, levels are noted below.  Lab Results  Component Value Date   TSH 3.950 02/04/2021   TSH 2.314 01/28/2021   TSH 2.902 01/21/2021   TSH 2.155 01/14/2021   TSH 1.333 12/10/2020    REVIEW OF SYSTEMS: Review of Systems  Constitutional:  Positive  for appetite change and fatigue. Negative for chills, fever and unexpected weight change.  HENT:   Positive for mouth sores. Negative for hearing loss, lump/mass and trouble swallowing.   Eyes:  Negative for eye problems and icterus.  Respiratory:  Negative for chest tightness, cough and shortness of breath.   Cardiovascular:  Negative for chest pain, leg swelling and palpitations.  Gastrointestinal:  Negative for abdominal distention, abdominal pain, constipation, diarrhea, nausea and vomiting.  Endocrine: Negative for hot flashes.  Genitourinary:  Negative for difficulty urinating.   Musculoskeletal:  Negative for arthralgias.  Skin:  Negative for itching and rash.  Neurological:  Negative for dizziness, extremity weakness, headaches and numbness.  Hematological:  Negative for adenopathy. Does not bruise/bleed easily.  Psychiatric/Behavioral:  Negative for depression. The patient is not nervous/anxious.      COVID 19 VACCINATION STATUS: Pfizer x2, most recently 11/2019; infection 07/2020   HISTORY OF CURRENT ILLNESS: From the original intake note:  Camilia R Aune presented with a new palpable lump in the right breast. She underwent bilateral diagnostic mammography with tomography and right breast ultrasonography at [facility] on 11/06/2020 showing: breast density category B; 1.9 cm mass at palpable site of concern in right breast at 10 o'clock; 0.8 cm smaller mass in right breast at 9 o'clock; 0.7 cm small mass in right axilla, possibly a replaced lymph node; second mildly suspicious lymph node in axilla.  Accordingly on 11/08/2020 she proceeded to biopsy of the right breast area in question. The pathology from this procedure ([id number]) showed:    1. Right Breast, 10 o'clock - invasive ductal carcinoma, grade 3  - Prognostic indicators significant for: estrogen receptor, 5% "positive "with weak staining intensity (this is functionally negative) and progesterone receptor, 0% negative.  Proliferation marker Ki67 at 30%. HER2 equivocal by immunohistochemistry (2+), but negative by fluorescent in situ hybridization with a signals ratio 1.19 and number per cell 1.6. 2. Right Breast, 9 o'clock  - fibrocystic change 3. Right Axilla, lymph node  - metastatic carcinoma involving a lymph node  Cancer Staging Malignant neoplasm of upper-outer quadrant of right breast in female, estrogen receptor negative (HCC) Staging form: Breast, AJCC 8th Edition - Clinical: Stage IIB (cT1c, cN1, cM0, G3, ER-, PR-, HER2-) - Signed by Magrinat, Gustav C, MD on 11/20/2020 Histologic grading system: 3 grade system  The patient's subsequent history is as detailed below.   PAST MEDICAL HISTORY: Past Medical History:  Diagnosis Date  . Abdominal pain, generalized 12/27/2008  . Acute sinusitis, unspecified 02/26/2007  . ADD 03/13/2010  . ALLERGIC RHINITIS 02/23/2007  . ANEMIA 02/05/2007  . Anxiety 11/17/2011  . ATTENTION DEFICIT DISORDER, HX OF 02/05/2007  . Breast cancer (HCC)   . Deviated nasal septum 02/05/2007  . Diabetes mellitus without complication (HCC)   . Family history of breast cancer 11/20/2020  . Family history of melanoma 11/20/2020  . FATIGUE 02/23/2007  . HYPERLIPIDEMIA 02/23/2007  . Personal history of malignant melanoma 11/20/2020  . SWELLING MASS OR LUMP IN HEAD AND NECK 07/27/2008  . UTI 02/23/2007    PAST SURGICAL HISTORY: Past Surgical History:  Procedure Laterality Date  . BROW LIFT    . CESAREAN SECTION    . COSMETIC SURGERY  1995   Nasal  . MELANOMA EXCISION WITH SENTINEL LYMPH NODE BIOPSY Left 10/24/2012   Procedure: MELANOMA wide EXCISION left lateral thigh WITH SENTINEL LYMPH NODE BIOPSY left groin;  Surgeon: Benjamin T Hoxworth, MD;  Location: [facility];  Service: General;  Laterality: Left;  . NASAL SEPTUM SURGERY    . PORTACATH PLACEMENT Right 12/02/2020   Procedure: INSERTION PORT-A-CATH;  Surgeon: Tsuei, Matthew, MD;  Location: [facility];  Service: General;  Laterality: Right;    FAMILY HISTORY: Family History  Problem Relation Age of Onset  . Hypertension Mother   . Breast cancer Mother 75  . COPD Father   . Atrial fibrillation Father   . Atrial fibrillation Sister   . Kidney cancer Maternal Aunt        dx 50s  . Leukemia Paternal Aunt        dx after 50  . Cancer Paternal Aunt        unknown type; dx after 50  . Brain cancer Paternal Grandfather 73       brain tumor per pt  . Melanoma Cousin        arm; no mets per pt  . Alcohol abuse Other   . Depression Other   . Glaucoma Other   . Breast cancer Other        MGM's sisters, x3, dx after 50  . Colon polyps Neg Hx    Her father died at age 80 from COPD. Her mother is currently 80 years old as of 11/2020. Robin Farley has no full siblings and three half-siblings-- two brothers and one sister. She reports breast cancer in her mother and three great-aunts, brain tumor in her paternal grandfather, and metastatic melanoma in a cousin.   GYNECOLOGIC HISTORY:  Patient's last menstrual period was 04/27/2016. Menarche:   [age] years old Age at first live birth: 55 years old Robin Farley 2 LMP 2016 Contraceptive used for 1-2 years HRT estrogen cream irregularly Hysterectomy? no BSO? 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 She lives in [place], [place] but travels here frequently for work. Husband Robin Farley works in Press photographer for [employer]. She lives at home with Robin Farley and their daughter Robin Farley, who is 27 and is a Ship broker at [employer]. Daughter Robin Farley, age 42, is a Secretary/administrator in [place]. [place], [place]. 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    ADVANCED DIRECTIVES: In the absence of any documentation to the contrary, the patient's spouse is their HCPOA.    HEALTH MAINTENANCE: Social History   Tobacco Use  . Smoking status: Never  . Smokeless tobacco: Never  Substance Use Topics  .  Alcohol use: Yes    Comment: Very rare occasions  . Drug use: Never     Colonoscopy: 12/2014 (Dr. Henrene Pastor)  PAP: 2020  Bone density: unsure   Allergies  Allergen Reactions  . Promethazine Hcl     Legs twitch    Current Outpatient Medications  Medication Sig Dispense Refill  . fluconazole (DIFLUCAN) 200 MG tablet Take 1 tablet (200 mg total) by mouth daily. 30 tablet 1  . atorvastatin (LIPITOR) 20 MG tablet Take 1 tablet (20 mg total) by mouth daily. 90 tablet 3  . Cholecalciferol (THERA-D 2000) 50 MCG (2000 UT) TABS 1 tab by mouth once daily 30 tablet 99  . dexamethasone (DECADRON) 4 MG tablet Take 2 tablets once a day for 3 days after carboplatin and AC chemotherapy. Take with food. 30 tablet 1  . Emollient (ROC RETINOL CORREXION EYE) CREA Use as directed daily 15 mL 11  . lidocaine-prilocaine (EMLA) cream Apply to affected area once 30 g 3  . magic mouthwash (nystatin, lidocaine, diphenhydrAMINE, alum & mag hydroxide) suspension Swish 5 mLs and spit out 4 times a day as needed before meals and at bedtime. 240 mL 0  . metFORMIN (GLUCOPHAGE-XR) 500 MG 24 hr tablet Take 1 tablet (500 mg total) by mouth daily with breakfast. 90 tablet 3  . prochlorperazine (COMPAZINE) 10 MG tablet Take 1 tablet (10 mg total) by mouth every 6 (six) hours as needed (Nausea or vomiting). 30 tablet 1  . valACYclovir (VALTREX) 1000 MG tablet Take 1 tablet by mouth 2 times daily. 60 tablet 1  . zolpidem (AMBIEN) 5 MG tablet Take 1 tablet (5 mg total) by mouth at bedtime as needed for sleep. 20 tablet 0   No current facility-administered medications for this visit.   Facility-Administered Medications Ordered in Other Visits  Medication Dose Route Frequency Provider Last Rate Last Admin  . sodium chloride flush (NS) 0.9 % injection 10 mL  10 mL Intracatheter PRN Magrinat, Virgie Dad, MD   10 mL at 01/21/21 1144    OBJECTIVE: White woman who appears younger than stated age  4:   02/11/21 1041  BP: (!)  108/54  Pulse: (!) 111  Resp: 18  Temp: (!) 97.5 F (36.4 C)  SpO2: 99%       Body mass index is 25.94 kg/m.   Wt Readings from Last 3 Encounters:  02/11/21 160 lb 11.2 oz (72.9 kg)  02/04/21 162 lb (73.5 kg)  01/28/21 161 lb 8 oz (73.3 kg)     ECOG FS:1 - Symptomatic but completely ambulatory GENERAL: Patient is a well appearing female in  no acute distress HEENT:  Sclerae anicteric.  She has thrush noted in her mouth under her tongue along with some sloughing off the skin underneath her tongue which is minimal.  Neck is supple.  NODES:  No cervical, supraclavicular, or axillary lymphadenopathy palpated.  BREAST EXAM:  Deferred. LUNGS:  Clear to auscultation bilaterally.  No wheezes or rhonchi. HEART:  Regular rate and rhythm. No murmur appreciated. ABDOMEN:  Soft, nontender.  Positive, normoactive bowel sounds. No organomegaly palpated. MSK:  No focal spinal tenderness to palpation. Full range of motion bilaterally in the upper extremities. EXTREMITIES:  No peripheral edema.   SKIN:  Clear with no obvious rashes or skin changes. No nail dyscrasia. NEURO:  Nonfocal. Well oriented.  Appropriate affect.    LAB RESULTS:  CMP     Component Value Date/Time   NA 140 02/11/2021 1017   K 3.9 02/11/2021 1017   CL 107 02/11/2021 1017   CO2 26 02/11/2021 1017   GLUCOSE 90 02/11/2021 1017   BUN 14 02/11/2021 1017   CREATININE 0.82 02/11/2021 1017   CALCIUM 8.8 (L) 02/11/2021 1017   PROT 6.4 (L) 02/11/2021 1017   ALBUMIN 3.4 (L) 02/11/2021 1017   AST 8 (L) 02/11/2021 1017   ALT 17 02/11/2021 1017   ALKPHOS 87 02/11/2021 1017   BILITOT 0.5 02/11/2021 1017   GFRNONAA >60 02/11/2021 1017   GFRAA 100 03/23/2008 1212    No results found for: TOTALPROTELP, ALBUMINELP, A1GS, A2GS, BETS, BETA2SER, GAMS, MSPIKE, SPEI  Lab Results  Component Value Date   WBC 1.7 (L) 02/11/2021   NEUTROABS 0.7 (L) 02/11/2021   HGB 10.8 (L) 02/11/2021   HCT 31.0 (L) 02/11/2021   MCV 92.5  02/11/2021   PLT 105 (L) 02/11/2021    No results found for: LABCA2  No components found for: [id number]  No results for input(s): INR in the last 168 hours.  No results found for: LABCA2  No results found for: IFO277  No results found for: AJO878  No results found for: MVE720  No results found for: CA2729  No components found for: HGQUANT  No results found for: CEA1 / No results found for: CEA1   No results found for: AFPTUMOR  No results found for: CHROMOGRNA  No results found for: KPAFRELGTCHN, LAMBDASER, KAPLAMBRATIO (kappa/lambda light chains)  No results found for: HGBA, HGBA2QUANT, HGBFQUANT, HGBSQUAN (Hemoglobinopathy evaluation)   No results found for: LDH  Lab Results  Component Value Date   IRON 93 12/16/2018   IRONPCTSAT 25.6 12/16/2018   (Iron and TIBC)  No results found for: FERRITIN  Urinalysis    Component Value Date/Time   COLORURINE YELLOW 12/20/2019 1521   APPEARANCEUR CLEAR 12/20/2019 1521   LABSPEC 1.017 12/20/2019 1521   PHURINE 6.5 12/20/2019 1521   GLUCOSEU NEGATIVE 12/20/2019 1521   GLUCOSEU NEGATIVE 12/16/2018 1512   HGBUR NEGATIVE 12/20/2019 1521   BILIRUBINUR NEGATIVE 12/16/2018 1512   KETONESUR NEGATIVE 12/20/2019 1521   PROTEINUR NEGATIVE 12/20/2019 1521   UROBILINOGEN 0.2 12/16/2018 1512   NITRITE NEGATIVE 12/20/2019 1521   LEUKOCYTESUR NEGATIVE 12/20/2019 1521    STUDIES: No results found.   ELIGIBLE FOR AVAILABLE RESEARCH PROTOCOL: no  ASSESSMENT: 55 y.o. [place] woman status post right breast upper outer quadrant biopsy 11/08/2020 for a clinical T1c N1, stage IIB invasive ductal carcinoma, grade 3, functionally triple negative, with an MIB-1 of 30%  (A) right breast retroareolar biopsy 12/09/2020 shows invasive ductal carcinoma, grade 2  (1) genetics test 12/03/2020 through the  [facility]Expanded +RNAinsight Panel found no deleterious mutations in AIP, ALK, APC, ATM, AXIN2, BAP1, BARD1, BLM, BMPR1A,  BRCA1, BRCA2, BRIP1, CDC73, CDH1, CDK4, CDKN1B, CDKN2A, CHEK2, CTNNA1, DICER1, FANCC, FH, FLCN, GALNT12, KIF1B, LZTR1, MAX, MEN1, MET, MLH1, MSH2, MSH3, MSH6, MUTYH, NBN, NF1, NF2, NTHL1, PALB2, PHOX2B, PMS2, POT1, PRKAR1A, PTCH1, PTEN, RAD51C, RAD51D, RB1, RECQL, RET, SDHA, SDHAF2, SDHB, SDHC, SDHD, SMAD4, SMARCA4, SMARCB1, SMARCE1, STK11, SUFU, TMEM127, TP53, TSC1, TSC2, VHL and XRCC2 (sequencing and deletion/duplication); EGFR, EGLN1, HOXB13, KIT, MITF, PDGFRA, POLD1, and POLE (sequencing only); EPCAM and GREM1 (deletion/duplication only).    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  She tolerated this moderately well.  There are some things that we can improve with her symptoms for next time.  First of all, today she will receive 1 L of IV fluids.  Her labs demonstrate a white blood cell count of 1.7 with pending neutrophil count.  Her kidney function is normal which is positive and she has Farley a good job keeping up with her fluid intake.  We will administer IV fluids on the day of her injection with subsequent cycles and for 2 days after her injection today to help prevent her feeling this poorly.  Second there is the issue of her thrush.  I have sent in a refill of her Magic mouthwash to the [facility].  I have also increase the dose of her Diflucan to 200 mg/day.  I instructed her to take the Diflucan daily for the rest of her chemotherapy regimen.  I discussed the above in detail with Mariaceleste her husband and her daughter.  They are  all in agreement with the plan.  She will proceed with IV fluids today and will return in 2 weeks for labs follow-up and her next treatment.  She knows to call for any questions that may arise between now and her next appointment.  We are happy to see her sooner if needed.  Total encounter time: 30 minutes  an face-to-face visit time chart review lab review order entry care coordination and documentation of the encounter.     Wilber Bihari, NP 02/11/21 11:26 AM Medical Oncology and Hematology [facility] [place], [place] [address] Tel. [phone]    Fax. [phone]     *Total Encounter Time as defined by the Centers for Medicare and Medicaid Services includes, in addition to the face-to-face time of a patient visit (documented in the note above) non-face-to-face time: obtaining and reviewing outside history, ordering and reviewing medications, tests or procedures, care coordination (communications with other health care professionals or caregivers) and documentation in the medical record.

## 2021-02-11 NOTE — Patient Instructions (Signed)

## 2021-02-11 NOTE — Progress Notes (Signed)
Per Wilber Bihari, NP - okay to run fluids over 1 hour. Pump adjusted accordingly.

## 2021-02-12 ENCOUNTER — Other Ambulatory Visit (HOSPITAL_COMMUNITY): Payer: Self-pay

## 2021-02-13 ENCOUNTER — Other Ambulatory Visit (HOSPITAL_COMMUNITY): Payer: Self-pay

## 2021-02-13 ENCOUNTER — Other Ambulatory Visit: Payer: Self-pay

## 2021-02-14 ENCOUNTER — Encounter: Payer: Self-pay | Admitting: Adult Health

## 2021-02-15 ENCOUNTER — Other Ambulatory Visit (HOSPITAL_COMMUNITY): Payer: Self-pay

## 2021-02-17 ENCOUNTER — Telehealth: Payer: Self-pay | Admitting: Adult Health

## 2021-02-17 ENCOUNTER — Telehealth: Payer: Self-pay

## 2021-02-17 NOTE — Telephone Encounter (Signed)
Unable to reach pt.  Phoned spouse and spoke with pt who says she is feeling better and declines IV fluids for today

## 2021-02-17 NOTE — Telephone Encounter (Signed)
Sch per 11/7 inbasket, left msg

## 2021-02-18 ENCOUNTER — Inpatient Hospital Stay: Payer: Managed Care, Other (non HMO)

## 2021-02-18 ENCOUNTER — Other Ambulatory Visit (HOSPITAL_COMMUNITY): Payer: Self-pay

## 2021-02-19 ENCOUNTER — Other Ambulatory Visit (HOSPITAL_COMMUNITY): Payer: Self-pay

## 2021-02-22 ENCOUNTER — Other Ambulatory Visit: Payer: Self-pay | Admitting: Oncology

## 2021-02-22 DIAGNOSIS — Z171 Estrogen receptor negative status [ER-]: Secondary | ICD-10-CM

## 2021-02-22 DIAGNOSIS — C50411 Malignant neoplasm of upper-outer quadrant of right female breast: Secondary | ICD-10-CM

## 2021-02-24 ENCOUNTER — Other Ambulatory Visit: Payer: Self-pay | Admitting: Oncology

## 2021-02-24 ENCOUNTER — Encounter: Payer: Self-pay | Admitting: Oncology

## 2021-02-24 DIAGNOSIS — C50411 Malignant neoplasm of upper-outer quadrant of right female breast: Secondary | ICD-10-CM

## 2021-02-24 DIAGNOSIS — Z171 Estrogen receptor negative status [ER-]: Secondary | ICD-10-CM

## 2021-02-24 MED FILL — Fosaprepitant Dimeglumine For IV Infusion 150 MG (Base Eq): INTRAVENOUS | Qty: 5 | Status: AC

## 2021-02-24 MED FILL — Dexamethasone Sodium Phosphate Inj 100 MG/10ML: INTRAMUSCULAR | Qty: 1 | Status: AC

## 2021-02-25 ENCOUNTER — Inpatient Hospital Stay: Payer: Managed Care, Other (non HMO)

## 2021-02-25 ENCOUNTER — Other Ambulatory Visit (HOSPITAL_COMMUNITY): Payer: Self-pay

## 2021-02-25 ENCOUNTER — Inpatient Hospital Stay (HOSPITAL_BASED_OUTPATIENT_CLINIC_OR_DEPARTMENT_OTHER): Payer: Managed Care, Other (non HMO) | Admitting: Adult Health

## 2021-02-25 ENCOUNTER — Encounter: Payer: Self-pay | Admitting: Adult Health

## 2021-02-25 ENCOUNTER — Other Ambulatory Visit: Payer: Self-pay

## 2021-02-25 VITALS — BP 127/66 | HR 86 | Temp 97.9°F | Resp 18 | Wt 161.0 lb

## 2021-02-25 DIAGNOSIS — Z95828 Presence of other vascular implants and grafts: Secondary | ICD-10-CM

## 2021-02-25 DIAGNOSIS — Z5112 Encounter for antineoplastic immunotherapy: Secondary | ICD-10-CM | POA: Diagnosis not present

## 2021-02-25 DIAGNOSIS — C50411 Malignant neoplasm of upper-outer quadrant of right female breast: Secondary | ICD-10-CM | POA: Diagnosis not present

## 2021-02-25 DIAGNOSIS — Z171 Estrogen receptor negative status [ER-]: Secondary | ICD-10-CM | POA: Diagnosis not present

## 2021-02-25 LAB — CBC WITH DIFFERENTIAL (CANCER CENTER ONLY)
Abs Immature Granulocytes: 0.03 10*3/uL (ref 0.00–0.07)
Basophils Absolute: 0 10*3/uL (ref 0.0–0.1)
Basophils Relative: 1 %
Eosinophils Absolute: 0 10*3/uL (ref 0.0–0.5)
Eosinophils Relative: 1 %
HCT: 29.2 % — ABNORMAL LOW (ref 36.0–46.0)
Hemoglobin: 10 g/dL — ABNORMAL LOW (ref 12.0–15.0)
Immature Granulocytes: 1 %
Lymphocytes Relative: 17 %
Lymphs Abs: 0.9 10*3/uL (ref 0.7–4.0)
MCH: 32.4 pg (ref 26.0–34.0)
MCHC: 34.2 g/dL (ref 30.0–36.0)
MCV: 94.5 fL (ref 80.0–100.0)
Monocytes Absolute: 0.8 10*3/uL (ref 0.1–1.0)
Monocytes Relative: 15 %
Neutro Abs: 3.4 10*3/uL (ref 1.7–7.7)
Neutrophils Relative %: 65 %
Platelet Count: 369 10*3/uL (ref 150–400)
RBC: 3.09 MIL/uL — ABNORMAL LOW (ref 3.87–5.11)
RDW: 16.5 % — ABNORMAL HIGH (ref 11.5–15.5)
WBC Count: 5.2 10*3/uL (ref 4.0–10.5)
nRBC: 0 % (ref 0.0–0.2)

## 2021-02-25 LAB — CMP (CANCER CENTER ONLY)
ALT: 21 U/L (ref 0–44)
AST: 21 U/L (ref 15–41)
Albumin: 3.6 g/dL (ref 3.5–5.0)
Alkaline Phosphatase: 92 U/L (ref 38–126)
Anion gap: 9 (ref 5–15)
BUN: 8 mg/dL (ref 6–20)
CO2: 23 mmol/L (ref 22–32)
Calcium: 9.1 mg/dL (ref 8.9–10.3)
Chloride: 109 mmol/L (ref 98–111)
Creatinine: 0.74 mg/dL (ref 0.44–1.00)
GFR, Estimated: >60 mL/min (ref >60)
Glucose, Bld: 86 mg/dL (ref 70–99)
Potassium: 3.8 mmol/L (ref 3.5–5.1)
Sodium: 141 mmol/L (ref 135–145)
Total Bilirubin: 0.3 mg/dL (ref 0.3–1.2)
Total Protein: 6.5 g/dL (ref 6.5–8.1)

## 2021-02-25 LAB — TSH: TSH: 1.498 u[IU]/mL (ref 0.308–3.960)

## 2021-02-25 MED ORDER — SODIUM CHLORIDE 0.9 % IV SOLN
600.0000 mg/m2 | Freq: Once | INTRAVENOUS | Status: AC
  Administered 2021-02-25: 1080 mg via INTRAVENOUS
  Filled 2021-02-25: qty 54

## 2021-02-25 MED ORDER — SODIUM CHLORIDE 0.9 % IV SOLN
10.0000 mg | Freq: Once | INTRAVENOUS | Status: AC
  Administered 2021-02-25: 10 mg via INTRAVENOUS
  Filled 2021-02-25: qty 10

## 2021-02-25 MED ORDER — SODIUM CHLORIDE 0.9 % IV SOLN
150.0000 mg | Freq: Once | INTRAVENOUS | Status: AC
  Administered 2021-02-25: 150 mg via INTRAVENOUS
  Filled 2021-02-25: qty 150

## 2021-02-25 MED ORDER — SODIUM CHLORIDE 0.9% FLUSH
10.0000 mL | INTRAVENOUS | Status: DC | PRN
  Administered 2021-02-25: 10 mL

## 2021-02-25 MED ORDER — HEPARIN SOD (PORK) LOCK FLUSH 100 UNIT/ML IV SOLN
500.0000 [IU] | Freq: Once | INTRAVENOUS | Status: AC | PRN
  Administered 2021-02-25: 500 [IU]

## 2021-02-25 MED ORDER — SODIUM CHLORIDE 0.9 % IV SOLN: Freq: Once | INTRAVENOUS | Status: AC

## 2021-02-25 MED ORDER — DOXORUBICIN HCL CHEMO IV INJECTION 2 MG/ML
60.0000 mg/m2 | Freq: Once | INTRAVENOUS | Status: AC
  Administered 2021-02-25: 108 mg via INTRAVENOUS
  Filled 2021-02-25: qty 54

## 2021-02-25 MED ORDER — PALONOSETRON HCL INJECTION 0.25 MG/5ML
0.2500 mg | Freq: Once | INTRAVENOUS | Status: AC
  Administered 2021-02-25: 0.25 mg via INTRAVENOUS
  Filled 2021-02-25: qty 5

## 2021-02-25 MED ORDER — SODIUM CHLORIDE 0.9 % IV SOLN
200.0000 mg | Freq: Once | INTRAVENOUS | Status: AC
  Administered 2021-02-25: 200 mg via INTRAVENOUS
  Filled 2021-02-25: qty 8

## 2021-02-25 NOTE — Progress Notes (Signed)
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  She is accompanied by her husband.  She is receiving neoadjuvant chemotherapy and is currently receiving Doxorubicin, Cyclophosphamide and Pembrolizumab every 21 days x 4 cycles.  Today is cycle 2 day 1 of therapy.  [name] is doing well today.  She notes that she continues to have mild fatigue, however she is still functioning.  She was able to get her Christmas decorations out of storage and put them up.  [name] notes that her hair is starting to come out.  She wonders if she should continue doing the Digna cap.  She notes some mild skin changes in the right side of her neck.  She has no tenderness or pain to that area.  There is no swelling to the area.  She feels like her breast cancer on the right side is more difficult to feel.  She notes that she is ready to proceed with her treatment today   We are following her TSH since she is receiving Pembrolizumab, levels are noted below.  Lab Results  Component Value Date   TSH 1.474 02/11/2021   TSH 3.950 02/04/2021   TSH 2.314 01/28/2021   TSH 2.902 01/21/2021   TSH 2.155 01/14/2021    REVIEW OF SYSTEMS: Review of Systems  Constitutional:  Positive for fatigue. Negative for appetite change,  chills, fever and unexpected weight change.  HENT:   Negative for hearing loss, lump/mass, mouth sores and trouble swallowing.   Eyes:  Negative for eye problems and icterus.  Respiratory:  Negative for chest tightness, cough and shortness of breath.   Cardiovascular:  Negative for chest pain, leg swelling and palpitations.  Gastrointestinal:  Negative for abdominal distention, abdominal pain, constipation, diarrhea, nausea and vomiting.  Endocrine: Negative for hot flashes.  Genitourinary:  Negative for difficulty urinating.   Musculoskeletal:  Negative for arthralgias.  Skin:  Negative for itching and rash.  Neurological:  Negative for dizziness, extremity weakness, headaches and numbness.  Hematological:  Negative for adenopathy. Does not bruise/bleed easily.  Psychiatric/Behavioral:  Negative for depression. The patient is not nervous/anxious.      COVID 19 VACCINATION STATUS: [place] x2, most recently 11/2019; infection 07/2020   HISTORY OF CURRENT ILLNESS: From the original intake note:  Robin Farley presented with a new palpable lump in the right breast. She underwent bilateral diagnostic mammography with tomography and right breast ultrasonography at [facility] on 11/06/2020 showing: breast density category B; 1.9 cm mass at palpable site of concern in right breast at 10 o'clock; 0.8 cm smaller mass in right breast at 9 o'clock; 0.7 cm small mass in right axilla, possibly a replaced lymph node; second mildly suspicious lymph node in axilla.  Accordingly on 11/08/2020 she proceeded to biopsy of the right breast area in question. The pathology from this procedure ([id number]) showed:  1. Right Breast, 10 o'clock -  invasive ductal carcinoma, grade 3  - Prognostic indicators significant for: estrogen receptor, 5% "positive "with weak staining intensity (this is functionally negative) and progesterone receptor, 0% negative. Proliferation marker Ki67 at 30%. HER2 equivocal by  immunohistochemistry (2+), but negative by fluorescent in situ hybridization with a signals ratio 1.19 and number per cell 1.6. 2. Right Breast, 9 o'clock  - fibrocystic change 3. Right Axilla, lymph node  - metastatic carcinoma involving a lymph node  Cancer Staging Malignant neoplasm of upper-outer quadrant of right breast in female, estrogen receptor negative ([place]) Staging form: Breast, AJCC 8th Edition - Clinical: Stage IIB (cT1c, cN1, cM0, G3, ER-, PR-, HER2-) - Signed by [name], MD on 11/20/2020 Histologic grading system: 3 grade system  The patient's subsequent history is as detailed below.   PAST MEDICAL HISTORY: Past Medical History:  Diagnosis Date   Abdominal pain, generalized 12/27/2008   Acute sinusitis, unspecified 02/26/2007   ADD 03/13/2010   ALLERGIC RHINITIS 02/23/2007   ANEMIA 02/05/2007   Anxiety 11/17/2011   ATTENTION DEFICIT DISORDER, HX OF 02/05/2007   Breast cancer ([place])    Deviated nasal septum 02/05/2007   Diabetes mellitus without complication ([place])    Family history of breast cancer 11/20/2020   Family history of melanoma 11/20/2020   FATIGUE 02/23/2007   HYPERLIPIDEMIA 02/23/2007   Personal history of malignant melanoma 11/20/2020   SWELLING MASS OR LUMP IN HEAD AND NECK 07/27/2008   UTI 02/23/2007    PAST SURGICAL HISTORY: Past Surgical History:  Procedure Laterality Date   BROW LIFT     CESAREAN [place]   Nasal   MELANOMA EXCISION WITH SENTINEL LYMPH NODE BIOPSY Left 10/24/2012   Procedure: MELANOMA wide EXCISION left lateral thigh WITH SENTINEL LYMPH NODE BIOPSY left groin;  Surgeon: Edward Jolly, MD;  Location: [place];  Service: General;  Laterality: Left;   NASAL SEPTUM SURGERY     PORTACATH PLACEMENT Right 12/02/2020   Procedure: INSERTION PORT-A-CATH;  Surgeon: Donnie Mesa, MD;  Location: [facility];  Service: General;  Laterality: Right;    FAMILY  HISTORY: Family History  Problem Relation Age of Onset   Hypertension Mother    Breast cancer Mother 65   COPD Father    Atrial fibrillation Father    Atrial fibrillation Sister    Kidney cancer Maternal Aunt        dx 64s   Leukemia Paternal Aunt        dx after 64   Cancer Paternal Aunt        unknown type; dx after 70   Brain cancer Paternal Grandfather 73       brain tumor per pt   Melanoma Cousin        arm; no mets per pt   Alcohol abuse Other    Depression Other    Glaucoma Other    Breast cancer Other        MGM's sisters, x3, dx after 13   Colon polyps Neg Hx    Her father died at age 110 from COPD. Her mother is currently 74 years old as of 11/2020. Ekaterina has no full siblings and three half-siblings-- two brothers and one sister. She reports breast cancer in her mother and three great-aunts, brain tumor in her paternal grandfather, and metastatic melanoma in a cousin.   GYNECOLOGIC HISTORY:  Patient's last menstrual period was 04/27/2016. Menarche: [age] years old Age at first  live birth: 55 years old [place] P 2 LMP 2016 Contraceptive used for 1-2 years HRT estrogen cream irregularly Hysterectomy? no BSO? 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 She lives in [place], [place] but travels here frequently for work. Husband [name] works in Press photographer for [employer]. She lives at home with [name] and their daughter [name], who is 70 and is a Ship broker at [employer]. Daughter [name], age 28, is a Secretary/administrator in [place]. [place]. Joelle is not a Designer, fashion/clothing.    ADVANCED DIRECTIVES: In the absence of any documentation to the contrary, the patient's spouse is their HCPOA.    HEALTH MAINTENANCE: Social History   Tobacco Use   Smoking status: Never   Smokeless tobacco: Never  Substance Use Topics   Alcohol use: Yes    Comment: Very rare occasions   Drug use: Never     Colonoscopy:  12/2014 (Dr. Henrene Pastor)  PAP: 2020  Bone density: unsure   Allergies  Allergen Reactions   Promethazine Hcl     Legs twitch    Current Outpatient Medications  Medication Sig Dispense Refill   atorvastatin (LIPITOR) 20 MG tablet Take 1 tablet (20 mg total) by mouth daily. 90 tablet 3   Cholecalciferol (THERA-D 2000) 50 MCG (2000 UT) TABS 1 tab by mouth once daily 30 tablet 99   dexamethasone (DECADRON) 4 MG tablet Take 2 tablets once a day for 3 days after carboplatin and AC chemotherapy. Take with food. 30 tablet 1   Emollient (ROC RETINOL CORREXION EYE) CREA Use as directed daily 15 mL 11   fluconazole (DIFLUCAN) 200 MG tablet Take 1 tablet (200 mg total) by mouth daily. 30 tablet 1   lidocaine-prilocaine (EMLA) cream Apply to affected area once 30 g 3   magic mouthwash (nystatin, lidocaine, diphenhydrAMINE, alum & mag hydroxide) suspension Swish 5 mLs and spit out 4 times a day as needed before meals and at bedtime. 240 mL 0   metFORMIN (GLUCOPHAGE-XR) 500 MG 24 hr tablet Take 1 tablet (500 mg total) by mouth daily with breakfast. 90 tablet 3   prochlorperazine (COMPAZINE) 10 MG tablet Take 1 tablet (10 mg total) by mouth every 6 (six) hours as needed (Nausea or vomiting). 30 tablet 1   valACYclovir (VALTREX) 1000 MG tablet TAKE 1 TABLET BY MOUTH TWICE A DAY 60 tablet 1   zolpidem (AMBIEN) 5 MG tablet Take 1 tablet (5 mg total) by mouth at bedtime as needed for sleep. 20 tablet 0   No current facility-administered medications for this visit.   Facility-Administered Medications Ordered in Other Visits  Medication Dose Route Frequency Provider Last Rate Last Admin   0.9 %  sodium chloride infusion   Intravenous Once Magrinat, Virgie Dad, MD       cyclophosphamide (CYTOXAN) 1,080 mg in sodium chloride 0.9 % 250 mL chemo infusion  600 mg/m2 (Treatment Plan Recorded) Intravenous Once Magrinat, Virgie Dad, MD       dexamethasone (DECADRON) 10 mg in sodium chloride 0.9 % 50 mL IVPB  10 mg Intravenous  Once Magrinat, Virgie Dad, MD       DOXOrubicin (ADRIAMYCIN) chemo injection 108 mg  60 mg/m2 (Treatment Plan Recorded) Intravenous Once Magrinat, Virgie Dad, MD       fosaprepitant (EMEND) 150 mg in sodium chloride 0.9 % 145 mL IVPB  150 mg Intravenous Once Magrinat, Virgie Dad, MD       heparin lock  flush 100 unit/mL  500 Units Intracatheter Once PRN Magrinat, Virgie Dad, MD       palonosetron (ALOXI) injection 0.25 mg  0.25 mg Intravenous Once Magrinat, Virgie Dad, MD       pembrolizumab [facility]) 200 mg in sodium chloride 0.9 % 50 mL chemo infusion  200 mg Intravenous Once Magrinat, Virgie Dad, MD       sodium chloride flush (NS) 0.9 % injection 10 mL  10 mL Intracatheter PRN Magrinat, Virgie Dad, MD   10 mL at 01/21/21 1144   sodium chloride flush (NS) 0.9 % injection 10 mL  10 mL Intracatheter PRN Magrinat, Virgie Dad, MD        OBJECTIVE: White woman who appears younger than stated age  Vitals:   02/25/21 0858  BP: 127/66  Pulse: 86  Resp: 18  Temp: 97.9 F (36.6 C)  SpO2: 98%       Body mass index is 25.99 kg/m.   Wt Readings from Last 3 Encounters:  02/25/21 161 lb (73 kg)  02/11/21 160 lb 11.2 oz (72.9 kg)  02/04/21 162 lb (73.5 kg)     ECOG FS:1 - Symptomatic but completely ambulatory GENERAL: Patient is a well appearing female in no acute distress HEENT:  Sclerae anicteric.  She has thrush noted in her mouth under her tongue along with some sloughing off the skin underneath her tongue which is minimal.  Neck is supple.  NODES:  No cervical, supraclavicular, or axillary lymphadenopathy palpated.  BREAST EXAM: Difficult to palpate right sided breast cancer or lymphadenopathy.  No progression noted LUNGS:  Clear to auscultation bilaterally.  No wheezes or rhonchi. HEART:  Regular rate and rhythm. No murmur appreciated. ABDOMEN:  Soft, nontender.  Positive, normoactive bowel sounds. No organomegaly palpated. MSK:  No focal spinal tenderness to palpation. Full range of motion bilaterally  in the upper extremities. EXTREMITIES:  No peripheral edema.   SKIN:  Clear with no obvious rashes or skin changes. No nail dyscrasia. NEURO:  Nonfocal. Well oriented.  Appropriate affect.    LAB RESULTS:  CMP     Component Value Date/Time   NA 141 02/25/2021 0837   K 3.8 02/25/2021 0837   CL 109 02/25/2021 0837   CO2 23 02/25/2021 0837   GLUCOSE 86 02/25/2021 0837   BUN 8 02/25/2021 0837   CREATININE 0.74 02/25/2021 0837   CALCIUM 9.1 02/25/2021 0837   PROT 6.5 02/25/2021 0837   ALBUMIN 3.6 02/25/2021 0837   AST 21 02/25/2021 0837   ALT 21 02/25/2021 0837   ALKPHOS 92 02/25/2021 0837   BILITOT 0.3 02/25/2021 0837   GFRNONAA >60 02/25/2021 0837   GFRAA 100 03/23/2008 1212    No results found for: TOTALPROTELP, ALBUMINELP, A1GS, A2GS, BETS, BETA2SER, GAMS, MSPIKE, SPEI  Lab Results  Component Value Date   WBC 5.2 02/25/2021   NEUTROABS 3.4 02/25/2021   HGB 10.0 (L) 02/25/2021   HCT 29.2 (L) 02/25/2021   MCV 94.5 02/25/2021   PLT 369 02/25/2021    No results found for: LABCA2  No components found for: [id number]  No results for input(s): INR in the last 168 hours.  No results found for: LABCA2  No results found for: HWT888  No results found for: KCM034  No results found for: JZP915  No results found for: CA2729  No components found for: HGQUANT  No results found for: CEA1 / No results found for: CEA1   No results found for: AFPTUMOR  No results found for:  CHROMOGRNA  No results found for: KPAFRELGTCHN, LAMBDASER, KAPLAMBRATIO (kappa/lambda light chains)  No results found for: HGBA, HGBA2QUANT, HGBFQUANT, HGBSQUAN (Hemoglobinopathy evaluation)   No results found for: LDH  Lab Results  Component Value Date   IRON 93 12/16/2018   IRONPCTSAT 25.6 12/16/2018   (Iron and TIBC)  No results found for: FERRITIN  Urinalysis    Component Value Date/Time   COLORURINE YELLOW 12/20/2019 1521   APPEARANCEUR CLEAR 12/20/2019 1521   LABSPEC 1.017  12/20/2019 1521   PHURINE 6.5 12/20/2019 1521   GLUCOSEU NEGATIVE 12/20/2019 1521   GLUCOSEU NEGATIVE 12/16/2018 1512   HGBUR NEGATIVE 12/20/2019 1521   BILIRUBINUR NEGATIVE 12/16/2018 1512   KETONESUR NEGATIVE 12/20/2019 1521   PROTEINUR NEGATIVE 12/20/2019 1521   UROBILINOGEN 0.2 12/16/2018 1512   NITRITE NEGATIVE 12/20/2019 1521   LEUKOCYTESUR NEGATIVE 12/20/2019 1521    STUDIES: No results found.   ELIGIBLE FOR AVAILABLE RESEARCH PROTOCOL: no  ASSESSMENT: 55 y.o. [place] woman status post right breast upper outer quadrant biopsy 11/08/2020 for a clinical T1c N1, stage IIB invasive ductal carcinoma, grade 3, functionally triple negative, with an MIB-1 of 30%  (A) right breast retroareolar biopsy 12/09/2020 shows invasive ductal carcinoma, grade 2  (1) genetics test 12/03/2020 through the [facility] +RNAinsight Panel found no deleterious mutations in AIP, ALK, APC, ATM, AXIN2, BAP1, BARD1, BLM, BMPR1A, BRCA1, BRCA2, BRIP1, CDC73, CDH1, CDK4, CDKN1B, CDKN2A, CHEK2, CTNNA1, DICER1, FANCC, FH, FLCN, GALNT12, KIF1B, LZTR1, MAX, MEN1, MET, MLH1, MSH2, MSH3, MSH6, MUTYH, NBN, NF1, NF2, NTHL1, PALB2, PHOX2B, PMS2, POT1, PRKAR1A, PTCH1, PTEN, RAD51C, RAD51D, RB1, RECQL, RET, SDHA, SDHAF2, SDHB, SDHC, SDHD, SMAD4, SMARCA4, SMARCB1, SMARCE1, STK11, SUFU, TMEM127, TP53, TSC1, TSC2, VHL and XRCC2 (sequencing and deletion/duplication); EGFR, EGLN1, HOXB13, KIT, MITF, PDGFRA, POLD1, and POLE (sequencing only); EPCAM and GREM1 (deletion/duplication only).    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  I reviewed her labs with her today which are stable.  She will proceed with her second treatment dose.  She will return in 2 days for IV fluids and her growth factor injection.  We have ordered IV fluids for her Thursday Friday and Saturday to help prevent any dehydration that she experienced with the last cycle.  She has no mouth sores.  She has no cervical lymphadenopathy on exam.  She will let me know if she develops any pain and they plan to pick up the Magic mouthwash today.  I sent a message over to the pharmacy and they are expecting them to come by and pick up the medicine.  Amyria and I reviewed her overall treatment plan today.  She will get her third cycle of treatment on December 6 and her fourth cycle on December 27.  I let her know that after that final cycle we will get her breast MRI, she will follow-up with surgery, and she will likely have surgery at the end of January.  We also discussed her radiation therapy.  I reviewed that there are many factors that go into deciding the treatment length of radiation.  I let her know that it is typically 4 to  6 weeks.  Edmonia will return on December 6 for labs, follow-up with Dr. Jana Hakim, and her third cycle of chemotherapy.  She knows to call for any questions or concerns that may arise in the interim, as we are always happy to see her sooner if indicated.  Total encounter time: 30 minutes  an face-to-face visit time chart review lab review order entry care coordination and documentation of the encounter.     Wilber Bihari, NP 02/25/21 9:35 AM Medical Oncology and Hematology [facility] [place], [place] [address] Tel. [phone]    Fax. [phone]     *Total Encounter Time as defined by the Centers for Medicare and Medicaid Services includes, in addition to the face-to-face time of a patient visit  (documented in the note above) non-face-to-face time: obtaining and reviewing outside history, ordering and reviewing medications, tests or procedures, care coordination (communications with other health care professionals or caregivers) and documentation in the medical record.

## 2021-02-25 NOTE — Patient Instructions (Signed)
[facility] MEDICAL ONCOLOGY  Discharge Instructions: Thank you for choosing [facility] to provide your oncology and hematology care.   If you have a lab appointment with the Cancer Center, please go directly to the Cancer Center and check in at the registration area.   Wear comfortable clothing and clothing appropriate for easy access to any Portacath or PICC line.   We strive to give you quality time with your provider. You may need to reschedule your appointment if you arrive late (15 or more minutes).  Arriving late affects you and other patients whose appointments are after yours.  Also, if you miss three or more appointments without notifying the office, you may be dismissed from the clinic at the provider's discretion.      For prescription refill requests, have your pharmacy contact our office and allow 72 hours for refills to be completed.    Today you received the following chemotherapy and/or immunotherapy agents Pembrolizumab, Doxorubicin, and Cyclophosphamide      To help prevent nausea and vomiting after your treatment, we encourage you to take your nausea medication as directed.  BELOW ARE SYMPTOMS THAT SHOULD BE REPORTED IMMEDIATELY: *FEVER GREATER THAN 100.4 F (38 C) OR HIGHER *CHILLS OR SWEATING *NAUSEA AND VOMITING THAT IS NOT CONTROLLED WITH YOUR NAUSEA MEDICATION *UNUSUAL SHORTNESS OF BREATH *UNUSUAL BRUISING OR BLEEDING *URINARY PROBLEMS (pain or burning when urinating, or frequent urination) *BOWEL PROBLEMS (unusual diarrhea, constipation, pain near the anus) TENDERNESS IN MOUTH AND THROAT WITH OR WITHOUT PRESENCE OF ULCERS (sore throat, sores in mouth, or a toothache) UNUSUAL RASH, SWELLING OR PAIN  UNUSUAL VAGINAL DISCHARGE OR ITCHING   Items with * indicate a potential emergency and should be followed up as soon as possible or go to the Emergency Department if any problems should occur.  Please show the CHEMOTHERAPY ALERT CARD or  IMMUNOTHERAPY ALERT CARD at check-in to the Emergency Department and triage nurse.  Should you have questions after your visit or need to cancel or reschedule your appointment, please contact [facility] MEDICAL ONCOLOG[facility]  Dept: [phone]  and follow the prompts.  Office hours are 8:00 a.m. to 4:30 p.m. Monday - Friday. Please note that voicemails left after 4:00 p.m. may not be returned until the following business day.  We are closed weekends and major holidays. You have access to a nurse at all times for urgent questions. Please call the main number to the clinic Dept: [phone] and follow the prompts.   For any non-urgent questions, you may also contact your provider using MyChart. We now offer e-Visits for anyone 18 and older to request care online for non-urgent symptoms. For details visit [email or web address].   Also download the MyChart app! Go to the app store, search "MyChart", open the app, select [facility], and log in with your MyChart username and password.  Due to Covid, a mask is required upon entering the hospital/clinic. If you do not have a mask, one will be given to you upon arrival. For doctor visits, patients may have 1 support person aged 18 or older with them. For treatment visits, patients cannot have anyone with them due to current Covid guidelines and our immunocompromised population.   

## 2021-02-26 NOTE — Progress Notes (Signed)
The following biosimilar Ziextenzo (pegfilgrastim-bmez) has been selected for use in this patient.  Kennith Center, Pharm.D., CPP 02/26/2021@11 :29 AM

## 2021-02-27 ENCOUNTER — Other Ambulatory Visit: Payer: Self-pay

## 2021-02-27 ENCOUNTER — Ambulatory Visit: Payer: Managed Care, Other (non HMO)

## 2021-02-27 ENCOUNTER — Inpatient Hospital Stay: Payer: Managed Care, Other (non HMO)

## 2021-02-27 VITALS — BP 127/75 | HR 78 | Temp 97.8°F | Resp 17

## 2021-02-27 DIAGNOSIS — Z5112 Encounter for antineoplastic immunotherapy: Secondary | ICD-10-CM | POA: Diagnosis not present

## 2021-02-27 DIAGNOSIS — Z171 Estrogen receptor negative status [ER-]: Secondary | ICD-10-CM

## 2021-02-27 DIAGNOSIS — Z95828 Presence of other vascular implants and grafts: Secondary | ICD-10-CM

## 2021-02-27 MED ORDER — SODIUM CHLORIDE 0.9% FLUSH
10.0000 mL | Freq: Once | INTRAVENOUS | Status: AC | PRN
  Administered 2021-02-27: 12:00:00 10 mL

## 2021-02-27 MED ORDER — SODIUM CHLORIDE 0.9 % IV SOLN: Freq: Once | INTRAVENOUS | Status: AC

## 2021-02-27 MED ORDER — PROCHLORPERAZINE MALEATE 10 MG PO TABS
10.0000 mg | ORAL_TABLET | Freq: Once | ORAL | Status: AC
  Administered 2021-02-27: 11:00:00 10 mg via ORAL
  Filled 2021-02-27: qty 1

## 2021-02-27 MED ORDER — HEPARIN SOD (PORK) LOCK FLUSH 100 UNIT/ML IV SOLN
500.0000 [IU] | Freq: Once | INTRAVENOUS | Status: AC | PRN
  Administered 2021-02-27: 12:00:00 500 [IU]

## 2021-02-27 MED ORDER — PEGFILGRASTIM-BMEZ 6 MG/0.6ML ~~LOC~~ SOSY
6.0000 mg | PREFILLED_SYRINGE | Freq: Once | SUBCUTANEOUS | Status: AC
  Administered 2021-02-27: 11:00:00 6 mg via SUBCUTANEOUS
  Filled 2021-02-27: qty 0.6

## 2021-02-27 NOTE — Patient Instructions (Signed)

## 2021-02-28 ENCOUNTER — Inpatient Hospital Stay: Payer: Managed Care, Other (non HMO)

## 2021-03-01 ENCOUNTER — Inpatient Hospital Stay: Payer: Managed Care, Other (non HMO)

## 2021-03-03 ENCOUNTER — Other Ambulatory Visit: Payer: Self-pay | Admitting: *Deleted

## 2021-03-03 MED ORDER — ONDANSETRON HCL 8 MG PO TABS: 8.0000 mg | ORAL_TABLET | Freq: Three times a day (TID) | ORAL | 3 refills | Status: DC | PRN

## 2021-03-03 NOTE — Telephone Encounter (Signed)
This RN spoke with husband and Elanda per their call today stating she is having continued thrush despite use of the diflucan.  She is also on valtrex and MMW.  Per her husband he is monitoring and she is able to hydrate with 3L of liquid daily.  She is not eating well with noted avoidance of textures and taste.  She is having some mild nausea breakthrough while on the compazine.  Per review with MD- recommendation is for the pt to increase her diflucan to 2 tabs daily x 3 days.  Prescription for zofran sent to pharmacy.  Above discussed with patient- including to presently hold on using the MMW and to use salt water rinses ( can rinse hourly ) and use coconut oil ( 1 tsp ) to swish and quell and spit for possible benefit.  Dahiana stated understanding of above as well as she will call on Wednesday to give update.  Of note pt is loosing her hair - primarily on the back of her scalp despite use of the dignity cap.  This RN validated above- with support that the Dignity Cap is not effective for prevention in all patients.  No other needs at this time.

## 2021-03-05 ENCOUNTER — Other Ambulatory Visit: Payer: Self-pay | Admitting: *Deleted

## 2021-03-10 ENCOUNTER — Telehealth: Payer: Self-pay

## 2021-03-10 NOTE — Telephone Encounter (Signed)
Returned call to pt and spouse.  Pt reports a fall while trying to get out of the bath tub which is causing pain in the lumbar region, no bruise seen.  She does not want to be seen in the ED.  Pt has been taking hydrocodone from home and applies ice throughout the day which she feels are helping to 'ease things' a bit.  I advised the pt to rest and call if things change.  I also encouraged the pt to seek care at the ED and again she declines.  Pt is also requesting we call her insurance company to discuss their denial to pay for new valtrex Rx.  Pt verbalized understanding and thanks.

## 2021-03-11 ENCOUNTER — Telehealth: Payer: Self-pay

## 2021-03-11 NOTE — Telephone Encounter (Signed)
Notified Patient of completion of Disability Claim Form. Fax transmission confirmation received. Copy of form mailed to Patient as requested.

## 2021-03-12 ENCOUNTER — Other Ambulatory Visit: Payer: Self-pay | Admitting: *Deleted

## 2021-03-12 ENCOUNTER — Telehealth: Payer: Self-pay | Admitting: *Deleted

## 2021-03-12 MED ORDER — ZIEXTENZO 6 MG/0.6ML ~~LOC~~ SOSY: 6.0000 mg | PREFILLED_SYRINGE | Freq: Once | SUBCUTANEOUS | 0 refills | Status: AC

## 2021-03-12 NOTE — Telephone Encounter (Signed)
Return call received from [facility] with Christella Scheuermann- stating pt was given an authorization for 1 time dose of  Ziextenzo per clinic administration but additional doses will need to be administered in the home.  Per [place] will set up home health for nurse to administer.  This RN's fax number given for [facility] to fax information.

## 2021-03-12 NOTE — Telephone Encounter (Signed)
This RN received VM from [facility] stating prescription for  Ziextenzo needs to be faxed to [facility] for delivery prior to 12/6.  Noted pt has Ziextenzo on care plan with noted appt for this office to give to pt.  Per prior inquiry not noted as pt needing to do self injections.  This RN returned call to [facility] at given number of [phone] and obtain identified VM- message left inquiring about requested prescription and expected administration for clarification .This RN's direct desk number given for return call.  Note per Mary's vm- fax number for [facility] given as [phone]

## 2021-03-17 ENCOUNTER — Other Ambulatory Visit: Payer: Self-pay | Admitting: Oncology

## 2021-03-17 ENCOUNTER — Other Ambulatory Visit: Payer: Self-pay

## 2021-03-17 DIAGNOSIS — C50411 Malignant neoplasm of upper-outer quadrant of right female breast: Secondary | ICD-10-CM

## 2021-03-17 DIAGNOSIS — Z171 Estrogen receptor negative status [ER-]: Secondary | ICD-10-CM

## 2021-03-17 MED FILL — Fosaprepitant Dimeglumine For IV Infusion 150 MG (Base Eq): INTRAVENOUS | Qty: 5 | Status: AC

## 2021-03-17 MED FILL — Dexamethasone Sodium Phosphate Inj 100 MG/10ML: INTRAMUSCULAR | Qty: 1 | Status: AC

## 2021-03-17 NOTE — Progress Notes (Signed)
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  She is accompanied by her husband and their daughter [name].  She is receiving neoadjuvant chemotherapy and is currently receiving Doxorubicin, Cyclophosphamide and Pembrolizumab every 21 days x 4 cycles.  Today is cycle 3 day 1 of therapy.    We are following her TSH since she is receiving Pembrolizumab, levels are noted below.  Lab Results  Component Value Date   TSH 1.498 02/25/2021   TSH 1.474 02/11/2021   TSH 3.950 02/04/2021   TSH 2.314 01/28/2021   TSH 2.902 01/21/2021    REVIEW OF SYSTEMS: Robin Farley felt overwhelmed today.  She was very tearful.  She did not know if she could continue chemotherapy.  She did fall about 2 or 3 weeks ago and hurt her lower back.  That has resolved and she has full use of her legs with no sensory or motor deficits.  She continues to have altered taste which is likely to be due to to the chemo itself and not to thrush.  She feels tired of course.  It is not so much that she is having 1 or other symptoms ACE test the continuation of everything and the feeling that this will never end.  Accordingly we discussed discontinuing treatment today   COVID 19 VACCINATION STATUS: [place] x2, most recently  11/2019; infection 07/2020   HISTORY OF CURRENT ILLNESS: From the original intake note:  Robin Farley presented with a new palpable lump in the right breast. She underwent bilateral diagnostic mammography with tomography and right breast ultrasonography at [facility] on 11/06/2020 showing: breast density category B; 1.9 cm mass at palpable site of concern in right breast at 10 o'clock; 0.8 cm smaller mass in right breast at 9 o'clock; 0.7 cm small mass in right axilla, possibly a replaced lymph node; second mildly suspicious lymph node in axilla.  Accordingly on 11/08/2020 she proceeded to biopsy of the right breast area in question. The pathology from this procedure ([id number]) showed:  1. Right Breast, 10 o'clock - invasive ductal carcinoma, grade 3  - Prognostic indicators significant for: estrogen receptor, 5% "positive "with weak staining intensity (this is functionally negative) and progesterone receptor, 0% negative. Proliferation marker Ki67 at 30%. HER2 equivocal by immunohistochemistry (2+), but negative by fluorescent in situ hybridization with a signals ratio 1.19 and number per cell 1.6. 2. Right Breast, 9 o'clock  - fibrocystic change 3. Right Axilla, lymph node  - metastatic carcinoma involving a lymph node   Cancer Staging  Malignant neoplasm of upper-outer quadrant of right breast in female, estrogen receptor negative ([place]) Staging form: Breast, AJCC 8th Edition - Clinical: Stage IIB (cT1c, cN1, cM0, G3, ER-, PR-, HER2-) - Signed by [name], MD on 11/20/2020 Histologic grading system: 3 grade system  The patient's subsequent history is as detailed below.   PAST MEDICAL HISTORY:  Past Medical History:  Diagnosis Date   Abdominal pain, generalized 12/27/2008   Acute sinusitis, unspecified 02/26/2007   ADD 03/13/2010   ALLERGIC RHINITIS 02/23/2007   ANEMIA 02/05/2007   Anxiety 11/17/2011   ATTENTION DEFICIT DISORDER, HX OF 02/05/2007   Breast cancer  ([place])    Deviated nasal septum 02/05/2007   Diabetes mellitus without complication ([place])    Family history of breast cancer 11/20/2020   Family history of melanoma 11/20/2020   FATIGUE 02/23/2007   HYPERLIPIDEMIA 02/23/2007   Personal history of malignant melanoma 11/20/2020   SWELLING MASS OR LUMP IN HEAD AND NECK 07/27/2008   UTI 02/23/2007    PAST SURGICAL HISTORY: Past Surgical History:  Procedure Laterality Date   BROW LIFT     CESAREAN [place]   Nasal   MELANOMA EXCISION WITH SENTINEL LYMPH NODE BIOPSY Left 10/24/2012   Procedure: MELANOMA wide EXCISION left lateral thigh WITH SENTINEL LYMPH NODE BIOPSY left groin;  Surgeon: Edward Jolly, MD;  Location: [facility];  Service: General;  Laterality: Left;   NASAL SEPTUM SURGERY     PORTACATH PLACEMENT Right 12/02/2020   Procedure: INSERTION PORT-A-CATH;  Surgeon: Donnie Mesa, MD;  Location: [facility];  Service: General;  Laterality: Right;    FAMILY HISTORY: Family History  Problem Relation Age of Onset   Hypertension Mother    Breast cancer Mother 76   COPD Father    Atrial fibrillation Father    Atrial fibrillation Sister    Kidney cancer Maternal Aunt        dx 60s   Leukemia Paternal Aunt        dx after 13   Cancer Paternal Aunt        unknown type; dx after 59   Brain cancer Paternal Grandfather 73       brain tumor per pt   Melanoma Cousin        arm; no mets per pt   Alcohol abuse Other    Depression Other    Glaucoma Other    Breast cancer Other        MGM's sisters, x3, dx after 49   Colon polyps Neg Hx    Her father died at age 23 from COPD. Her mother is currently 69 years old as of 11/2020. Robin Farley has no full siblings and three half-siblings-- two brothers and one sister. She reports breast cancer in her mother and three great-aunts, brain tumor in her paternal grandfather, and metastatic melanoma in a cousin.   GYNECOLOGIC HISTORY:   Patient's last menstrual period was 04/27/2016. Menarche: [age] years old Age at first live birth: 55 years old [place] P 2 LMP 2016 Contraceptive used for 1-2 years HRT estrogen cream irregularly Hysterectomy? no BSO? 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 She lives in [place], [place] but travels here frequently for work. Husband [name] works in Press photographer for [employer]. She lives at home with [name] and their daughter [name], who is 70 and is a Ship broker at [employer]. Daughter [name], age 16, is a Secretary/administrator in [place]. [place], [place]. Tamlyn is not a Designer, fashion/clothing.    ADVANCED DIRECTIVES: In the absence of any documentation to the contrary, the patient's spouse is their HCPOA.    HEALTH MAINTENANCE: Social History   Tobacco Use   Smoking status: Never   Smokeless tobacco: Never  Substance Use Topics   Alcohol use: Yes    Comment: Very rare occasions   Drug use: Never     Colonoscopy: 12/2014 (Dr. Henrene Pastor)  PAP: 2020  Bone density: unsure   Allergies  Allergen Reactions   Promethazine Hcl     Legs twitch    Current Outpatient Medications  Medication Sig Dispense Refill   mirtazapine (REMERON) 7.5 MG tablet Take 1 tablet (7.5 mg total) by mouth at bedtime. 60 tablet 6   atorvastatin (LIPITOR) 20 MG tablet Take 1 tablet (20 mg total) by mouth daily. 90 tablet 3   Cholecalciferol (THERA-D 2000) 50 MCG (2000 UT) TABS 1 tab by mouth once daily 30 tablet 99   dexamethasone (DECADRON) 4 MG tablet Take 2 tablets once a day for 3 days after carboplatin and AC chemotherapy. Take with food. 30 tablet 1   Emollient (ROC RETINOL CORREXION EYE) CREA Use as directed daily 15 mL 11   fluconazole (DIFLUCAN) 200 MG tablet Take 1 tablet (200 mg total) by mouth daily. 30 tablet 1   lidocaine-prilocaine (EMLA) cream Apply to affected area once 30 g 3   magic mouthwash (nystatin, lidocaine, diphenhydrAMINE, alum & mag  hydroxide) suspension Swish 5 mLs and spit out 4 times a day as needed before meals and at bedtime. 240 mL 0   metFORMIN (GLUCOPHAGE-XR) 500 MG 24 hr tablet Take 1 tablet (500 mg total) by mouth daily with breakfast. 90 tablet 3   ondansetron (ZOFRAN) 8 MG tablet Take 1 tablet (8 mg total) by mouth every 8 (eight) hours as needed for nausea or vomiting. 20 tablet 3   prochlorperazine (COMPAZINE) 10 MG tablet Take 1 tablet (10 mg total) by mouth every 6 (six) hours as needed (Nausea or vomiting). 30 tablet 1   valACYclovir (VALTREX) 1000 MG tablet TAKE 1 TABLET BY MOUTH TWICE A DAY 60 tablet 1   zolpidem (AMBIEN) 5 MG tablet Take 1 tablet (5 mg total) by mouth at bedtime as needed for sleep. 20 tablet 0   No current facility-administered medications for this visit.   Facility-Administered Medications Ordered in Other Visits  Medication Dose Route Frequency Provider Last Rate Last Admin   sodium chloride flush (NS) 0.9 % injection 10 mL  10 mL Intracatheter PRN Abelardo Seidner, Virgie Dad, MD   10 mL at 01/21/21 1144    OBJECTIVE: White woman who appears younger than stated age  Vitals:   03/18/21 0841  BP: 137/71  Pulse: 95  Resp: 16  Temp: (!) 97 F (36.1 C)  SpO2: 100%       Body mass index is 24.42 kg/m.   Wt Readings from Last 3 Encounters:  03/18/21 151 lb 4.8 oz (68.6 kg)  02/25/21 161 lb (73 kg)  02/11/21 160 lb 11.2 oz (72.9 kg)     ECOG FS:1 - Symptomatic but completely ambulatory  Sclerae unicteric, EOMs intact Oropharynx is clear, no signs of thrush No cervical or supraclavicular adenopathy Lungs no rales or rhonchi Heart regular rate and rhythm Abd soft, nontender, positive bowel sounds MSK no focal spinal tenderness, no upper extremity lymphedema Neuro: nonfocal, well oriented, appropriate affect Breasts: The mass in the right breast is still palpable, movable, with no skin or nipple change.   LAB RESULTS:  CMP     Component Value Date/Time   NA 142 03/18/2021  0821   K 3.5 03/18/2021 0821   CL 108 03/18/2021 0821   CO2 22 03/18/2021 0821   GLUCOSE 91 03/18/2021 9702  BUN 8 03/18/2021 0821   CREATININE 0.82 03/18/2021 0821   CALCIUM 9.2 03/18/2021 0821   PROT 6.7 03/18/2021 0821   ALBUMIN 3.6 03/18/2021 0821   AST 18 03/18/2021 0821   ALT 15 03/18/2021 0821   ALKPHOS 94 03/18/2021 0821   BILITOT 0.3 03/18/2021 0821   GFRNONAA >60 03/18/2021 0821   GFRAA 100 03/23/2008 1212    No results found for: TOTALPROTELP, ALBUMINELP, A1GS, A2GS, BETS, BETA2SER, GAMS, MSPIKE, SPEI  Lab Results  Component Value Date   WBC 6.9 03/18/2021   NEUTROABS 4.9 03/18/2021   HGB 10.3 (L) 03/18/2021   HCT 30.3 (L) 03/18/2021   MCV 96.5 03/18/2021   PLT 371 03/18/2021    No results found for: LABCA2  No components found for: [id number]  No results for input(s): INR in the last 168 hours.  No results found for: LABCA2  No results found for: WEX937  No results found for: JIR678  No results found for: LFY101  No results found for: CA2729  No components found for: HGQUANT  No results found for: CEA1 / No results found for: CEA1   No results found for: AFPTUMOR  No results found for: CHROMOGRNA  No results found for: KPAFRELGTCHN, LAMBDASER, KAPLAMBRATIO (kappa/lambda light chains)  No results found for: HGBA, HGBA2QUANT, HGBFQUANT, HGBSQUAN (Hemoglobinopathy evaluation)   No results found for: LDH  Lab Results  Component Value Date   IRON 93 12/16/2018   IRONPCTSAT 25.6 12/16/2018   (Iron and TIBC)  No results found for: FERRITIN  Urinalysis    Component Value Date/Time   COLORURINE YELLOW 12/20/2019 1521   APPEARANCEUR CLEAR 12/20/2019 1521   LABSPEC 1.017 12/20/2019 1521   PHURINE 6.5 12/20/2019 1521   GLUCOSEU NEGATIVE 12/20/2019 1521   GLUCOSEU NEGATIVE 12/16/2018 1512   HGBUR NEGATIVE 12/20/2019 1521   BILIRUBINUR NEGATIVE 12/16/2018 1512   KETONESUR NEGATIVE 12/20/2019 1521   PROTEINUR NEGATIVE 12/20/2019 1521    UROBILINOGEN 0.2 12/16/2018 1512   NITRITE NEGATIVE 12/20/2019 1521   LEUKOCYTESUR NEGATIVE 12/20/2019 1521    STUDIES: No results found.   ELIGIBLE FOR AVAILABLE RESEARCH PROTOCOL: no  ASSESSMENT: 55 y.o. [place] woman status post right breast upper outer quadrant biopsy 11/08/2020 for a clinical T1c N1, stage IIB invasive ductal carcinoma, grade 3, functionally triple negative, with an MIB-1 of 30%  (a) right breast retroareolar biopsy 12/09/2020 shows invasive ductal carcinoma, grade 2  (1) genetics test 12/03/2020 through the [facility] Panel found no deleterious mutations in AIP, ALK, APC, ATM, AXIN2, BAP1, BARD1, BLM, BMPR1A, BRCA1, BRCA2, BRIP1, CDC73, CDH1, CDK4, CDKN1B, CDKN2A, CHEK2, CTNNA1, DICER1, FANCC, FH, FLCN, GALNT12, KIF1B, LZTR1, MAX, MEN1, MET, MLH1, MSH2, MSH3, MSH6, MUTYH, NBN, NF1, NF2, NTHL1, PALB2, PHOX2B, PMS2, POT1, PRKAR1A, PTCH1, PTEN, RAD51C, RAD51D, RB1, RECQL, RET, SDHA, SDHAF2, SDHB, SDHC, SDHD, SMAD4, SMARCA4, SMARCB1, SMARCE1, STK11, SUFU, TMEM127, TP53, TSC1, TSC2, VHL and XRCC2 (sequencing and deletion/duplication); EGFR, EGLN1, HOXB13, KIT, MITF, PDGFRA, POLD1, and POLE (sequencing only); EPCAM and GREM1 (deletion/duplication only).    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  She will receive her third of 4 planned cycles of doxorubicin, cyclophosphamide  and pembrolizumab today.  She will return to see [place] in 3 weeks and at that point she is scheduled for her fourth cycle.  Just in case she does not feel she can complete the fourth cycle I am going to try to get her breast MRI scheduled for 04/01/2021.  Of course if we had a complete response by then I would feel a little bit more comfortable stopping chemo, but since I can still feel the tumor I would encourage her to receive her fourth and final cycle and then proceed to definitive surgery.  I think she will benefit from Remeron.  I have written for a very low dose to start this evening.  She understands this is a drug to be taken every night and not as needed.  As she has lost 11 pounds and I am hopeful this will help in terms of appetite as well.  We discussed some things she can do to change the taste of the food she is receiving and I recommended she get snacks at 10 AM 2 PM and in the evening after supper in addition to meals  She will receive fluids on days 3 4 and 5.  She tells me her insurance is calling her to tell her she needs to give her PEG fill gastrum herself at home.  That makes no sense in someone who is coming here for fluids but I will alert our pharmacy regarding that  Total encounter time 25 minutes.Sarajane Jews C. Pegge Cumberledge, MD 03/18/21 9:16 AM Medical Oncology and Hematology [facility] [place], [place] [address] Tel. [phone]    Fax. [phone]   I, Wilburn Mylar, am acting as scribe for Dr. Virgie Dad. Jaqlyn Gruenhagen.  I, Lurline Del MD, have reviewed the above documentation for accuracy and completeness, and I agree with the above.    *Total Encounter Time as defined by the Centers for Medicare and Medicaid Services includes, in addition to the face-to-face time of a patient visit (documented in the note above) non-face-to-face time: obtaining and reviewing outside history, ordering and reviewing medications, tests or procedures, care  coordination (communications with other health care professionals or caregivers) and documentation in the medical record.

## 2021-03-18 ENCOUNTER — Telehealth: Payer: Self-pay | Admitting: *Deleted

## 2021-03-18 ENCOUNTER — Other Ambulatory Visit: Payer: Self-pay

## 2021-03-18 ENCOUNTER — Encounter: Payer: Self-pay | Admitting: *Deleted

## 2021-03-18 ENCOUNTER — Inpatient Hospital Stay: Payer: Managed Care, Other (non HMO)

## 2021-03-18 ENCOUNTER — Inpatient Hospital Stay: Payer: Managed Care, Other (non HMO) | Attending: Oncology

## 2021-03-18 ENCOUNTER — Inpatient Hospital Stay: Payer: Managed Care, Other (non HMO) | Admitting: Oncology

## 2021-03-18 VITALS — BP 137/71 | HR 95 | Temp 97.0°F | Resp 16 | Ht 66.0 in | Wt 151.3 lb

## 2021-03-18 DIAGNOSIS — Z5189 Encounter for other specified aftercare: Secondary | ICD-10-CM | POA: Diagnosis not present

## 2021-03-18 DIAGNOSIS — Z7984 Long term (current) use of oral hypoglycemic drugs: Secondary | ICD-10-CM | POA: Diagnosis not present

## 2021-03-18 DIAGNOSIS — K1231 Oral mucositis (ulcerative) due to antineoplastic therapy: Secondary | ICD-10-CM | POA: Insufficient documentation

## 2021-03-18 DIAGNOSIS — Z171 Estrogen receptor negative status [ER-]: Secondary | ICD-10-CM | POA: Insufficient documentation

## 2021-03-18 DIAGNOSIS — Z79899 Other long term (current) drug therapy: Secondary | ICD-10-CM | POA: Diagnosis not present

## 2021-03-18 DIAGNOSIS — Z5112 Encounter for antineoplastic immunotherapy: Secondary | ICD-10-CM | POA: Diagnosis present

## 2021-03-18 DIAGNOSIS — R110 Nausea: Secondary | ICD-10-CM | POA: Diagnosis not present

## 2021-03-18 DIAGNOSIS — Z5111 Encounter for antineoplastic chemotherapy: Secondary | ICD-10-CM | POA: Insufficient documentation

## 2021-03-18 DIAGNOSIS — C773 Secondary and unspecified malignant neoplasm of axilla and upper limb lymph nodes: Secondary | ICD-10-CM | POA: Diagnosis not present

## 2021-03-18 DIAGNOSIS — C50411 Malignant neoplasm of upper-outer quadrant of right female breast: Secondary | ICD-10-CM

## 2021-03-18 DIAGNOSIS — Z95828 Presence of other vascular implants and grafts: Secondary | ICD-10-CM

## 2021-03-18 DIAGNOSIS — T451X5A Adverse effect of antineoplastic and immunosuppressive drugs, initial encounter: Secondary | ICD-10-CM | POA: Insufficient documentation

## 2021-03-18 LAB — CMP (CANCER CENTER ONLY)
ALT: 15 U/L (ref 0–44)
AST: 18 U/L (ref 15–41)
Albumin: 3.6 g/dL (ref 3.5–5.0)
Alkaline Phosphatase: 94 U/L (ref 38–126)
Anion gap: 12 (ref 5–15)
BUN: 8 mg/dL (ref 6–20)
CO2: 22 mmol/L (ref 22–32)
Calcium: 9.2 mg/dL (ref 8.9–10.3)
Chloride: 108 mmol/L (ref 98–111)
Creatinine: 0.82 mg/dL (ref 0.44–1.00)
GFR, Estimated: >60 mL/min (ref >60)
Glucose, Bld: 91 mg/dL (ref 70–99)
Potassium: 3.5 mmol/L (ref 3.5–5.1)
Sodium: 142 mmol/L (ref 135–145)
Total Bilirubin: 0.3 mg/dL (ref 0.3–1.2)
Total Protein: 6.7 g/dL (ref 6.5–8.1)

## 2021-03-18 LAB — CBC WITH DIFFERENTIAL (CANCER CENTER ONLY)
Abs Immature Granulocytes: 0.04 10*3/uL (ref 0.00–0.07)
Basophils Absolute: 0 10*3/uL (ref 0.0–0.1)
Basophils Relative: 1 %
Eosinophils Absolute: 0.1 10*3/uL (ref 0.0–0.5)
Eosinophils Relative: 1 %
HCT: 30.3 % — ABNORMAL LOW (ref 36.0–46.0)
Hemoglobin: 10.3 g/dL — ABNORMAL LOW (ref 12.0–15.0)
Immature Granulocytes: 1 %
Lymphocytes Relative: 14 %
Lymphs Abs: 1 10*3/uL (ref 0.7–4.0)
MCH: 32.8 pg (ref 26.0–34.0)
MCHC: 34 g/dL (ref 30.0–36.0)
MCV: 96.5 fL (ref 80.0–100.0)
Monocytes Absolute: 0.8 10*3/uL (ref 0.1–1.0)
Monocytes Relative: 12 %
Neutro Abs: 4.9 10*3/uL (ref 1.7–7.7)
Neutrophils Relative %: 71 %
Platelet Count: 371 10*3/uL (ref 150–400)
RBC: 3.14 MIL/uL — ABNORMAL LOW (ref 3.87–5.11)
RDW: 16.5 % — ABNORMAL HIGH (ref 11.5–15.5)
WBC Count: 6.9 10*3/uL (ref 4.0–10.5)
nRBC: 0 % (ref 0.0–0.2)

## 2021-03-18 LAB — TSH: TSH: 2.604 u[IU]/mL (ref 0.308–3.960)

## 2021-03-18 MED ORDER — SODIUM CHLORIDE 0.9 % IV SOLN
10.0000 mg | Freq: Once | INTRAVENOUS | Status: AC
  Administered 2021-03-18: 10 mg via INTRAVENOUS
  Filled 2021-03-18: qty 10

## 2021-03-18 MED ORDER — MIRTAZAPINE 7.5 MG PO TABS: 7.5000 mg | ORAL_TABLET | Freq: Every day | ORAL | 6 refills | Status: DC

## 2021-03-18 MED ORDER — SODIUM CHLORIDE 0.9% FLUSH
10.0000 mL | INTRAVENOUS | Status: DC | PRN
  Administered 2021-03-18: 10 mL

## 2021-03-18 MED ORDER — SODIUM CHLORIDE 0.9 % IV SOLN
Freq: Once | INTRAVENOUS | Status: AC
Start: 2021-03-18 — End: 2021-03-18

## 2021-03-18 MED ORDER — SODIUM CHLORIDE 0.9 % IV SOLN
200.0000 mg | Freq: Once | INTRAVENOUS | Status: AC
  Administered 2021-03-18: 200 mg via INTRAVENOUS
  Filled 2021-03-18: qty 8

## 2021-03-18 MED ORDER — HEPARIN SOD (PORK) LOCK FLUSH 100 UNIT/ML IV SOLN
500.0000 [IU] | Freq: Once | INTRAVENOUS | Status: AC | PRN
  Administered 2021-03-18: 500 [IU]

## 2021-03-18 MED ORDER — DOXORUBICIN HCL CHEMO IV INJECTION 2 MG/ML
60.0000 mg/m2 | Freq: Once | INTRAVENOUS | Status: AC
  Administered 2021-03-18: 108 mg via INTRAVENOUS
  Filled 2021-03-18: qty 54

## 2021-03-18 MED ORDER — SODIUM CHLORIDE 0.9 % IV SOLN
150.0000 mg | Freq: Once | INTRAVENOUS | Status: AC
  Administered 2021-03-18: 150 mg via INTRAVENOUS
  Filled 2021-03-18: qty 150

## 2021-03-18 MED ORDER — SODIUM CHLORIDE 0.9 % IV SOLN
600.0000 mg/m2 | Freq: Once | INTRAVENOUS | Status: AC
  Administered 2021-03-18: 1080 mg via INTRAVENOUS
  Filled 2021-03-18: qty 54

## 2021-03-18 MED ORDER — PALONOSETRON HCL INJECTION 0.25 MG/5ML
0.2500 mg | Freq: Once | INTRAVENOUS | Status: AC
  Administered 2021-03-18: 0.25 mg via INTRAVENOUS
  Filled 2021-03-18: qty 5

## 2021-03-18 NOTE — Telephone Encounter (Signed)
Approved today CaseI [id number];Status:Approved;Review Type:Prior Auth;Coverage Start Date:02/16/2021;Coverage End Date:09/14/2021; Drug Ziextenzo (pegfilgrastim-bmez) 6MG /0.6ML syringes Form [employer] Electronic PA Form (2017 NCPDP)  Copied from covermymeds web site

## 2021-03-18 NOTE — Telephone Encounter (Signed)
This RN spoke with pt per her call stating she spoke with her insurance provider who approved her obtaining the ziextenzio at this office once completion of questions per covermymeds.  This RN informed her this RN completed request with authorization obtained.  Pt stated appreciation.  No further questions at this time.

## 2021-03-18 NOTE — Telephone Encounter (Signed)
This RN was informed that pt has not received her shipment of ziextenzio per need for injections in the home. Per discussion with her she states her insurance company has not explained this to her and she is planning on getting it in this office when she gets her IVF as she has been doing with previous treatments.  This RN informed appropriate departments per this office regarding above- and was informed by the PA dept that [facility] is requesting a call from her with given number. This RN called pt and informed her.  This RN was then contacted by [place] stating " pt is declining for [facility] to ship the medication to her and she stated to them she will be getting it in the office as she has been doing with previous treatments " [employer] rep asked " could you call her and tell her that she needs to let them ship it to her .."  This RN explained pt's concerns as discussed earlier and also inquired if Christella Scheuermann has not spoken with the patient about her treatment plan and expectations prior to now - with response " she did not return our call "  This RN explained that pt only needs 2 more ziextenzio injections- and will already be coming into the office for IV fluids and requested an override for continuity of care.  Request being made per [facility] as requested.

## 2021-03-18 NOTE — Telephone Encounter (Signed)
Per discussion today with pt - and follow up per pending pegfilgrastin due on 12/8- pt states she has not received the medication from [facility] and has not been contacted by her insurance carrier regarding any issues.  " I've been getting the shots here so I do not know what this is about"  Pt is scheduled for IVF on 12/8 at this office and is expecting to get the injection here.  This RN informed pt of above request- of note MD,pharmacy and pre auth dept are aware of situation.

## 2021-03-19 ENCOUNTER — Other Ambulatory Visit: Payer: Self-pay | Admitting: *Deleted

## 2021-03-19 ENCOUNTER — Other Ambulatory Visit: Payer: Self-pay | Admitting: Oncology

## 2021-03-20 ENCOUNTER — Inpatient Hospital Stay: Payer: Managed Care, Other (non HMO)

## 2021-03-20 ENCOUNTER — Other Ambulatory Visit: Payer: Self-pay

## 2021-03-20 ENCOUNTER — Ambulatory Visit: Payer: Managed Care, Other (non HMO)

## 2021-03-20 ENCOUNTER — Encounter: Payer: Self-pay | Admitting: Oncology

## 2021-03-20 VITALS — BP 119/72 | HR 85 | Temp 98.4°F | Resp 17

## 2021-03-20 DIAGNOSIS — Z5112 Encounter for antineoplastic immunotherapy: Secondary | ICD-10-CM | POA: Diagnosis not present

## 2021-03-20 DIAGNOSIS — Z171 Estrogen receptor negative status [ER-]: Secondary | ICD-10-CM

## 2021-03-20 DIAGNOSIS — C50411 Malignant neoplasm of upper-outer quadrant of right female breast: Secondary | ICD-10-CM

## 2021-03-20 DIAGNOSIS — Z95828 Presence of other vascular implants and grafts: Secondary | ICD-10-CM

## 2021-03-20 MED ORDER — SODIUM CHLORIDE 0.9 % IV SOLN: Freq: Once | INTRAVENOUS | Status: AC

## 2021-03-20 MED ORDER — PEGFILGRASTIM-BMEZ 6 MG/0.6ML ~~LOC~~ SOSY
6.0000 mg | PREFILLED_SYRINGE | Freq: Once | SUBCUTANEOUS | Status: AC
  Administered 2021-03-20: 6 mg via SUBCUTANEOUS

## 2021-03-20 MED ORDER — HEPARIN SOD (PORK) LOCK FLUSH 100 UNIT/ML IV SOLN
500.0000 [IU] | Freq: Once | INTRAVENOUS | Status: AC | PRN
  Administered 2021-03-20: 500 [IU]

## 2021-03-20 MED ORDER — PEGFILGRASTIM-BMEZ 6 MG/0.6ML ~~LOC~~ SOSY: 6.0000 mg | PREFILLED_SYRINGE | Freq: Once | SUBCUTANEOUS | Status: DC

## 2021-03-20 MED ORDER — SODIUM CHLORIDE 0.9% FLUSH
10.0000 mL | INTRAVENOUS | Status: DC | PRN
  Administered 2021-03-20: 10 mL

## 2021-03-20 NOTE — Patient Instructions (Signed)
Pegfilgrastim Injection What is this medication? PEGFILGRASTIM (PEG fil gra stim) lowers the risk of infection in people who are receiving chemotherapy. It works by Building control surveyor make more white blood cells, which protects your body from infection. It may also be used to help people who have been exposed to high doses of radiation. This medicine may be used for other purposes; ask your health care provider or pharmacist if you have questions. COMMON BRAND NAME(S): Rexene Edison, Ziextenzo What should I tell my care team before I take this medication? They need to know if you have any of these conditions: Kidney disease Latex allergy Ongoing radiation therapy Sickle cell disease Skin reactions to acrylic adhesives (On-Body Injector only) An unusual or allergic reaction to pegfilgrastim, filgrastim, other medications, foods, dyes, or preservatives Pregnant or trying to get pregnant Breast-feeding How should I use this medication? This medication is for injection under the skin. If you get this medication at home, you will be taught how to prepare and give the pre-filled syringe or how to use the On-body Injector. Refer to the patient Instructions for Use for detailed instructions. Use exactly as directed. Tell your care team immediately if you suspect that the On-body Injector may not have performed as intended or if you suspect the use of the On-body Injector resulted in a missed or partial dose. It is important that you put your used needles and syringes in a special sharps container. Do not put them in a trash can. If you do not have a sharps container, call your pharmacist or care team to get one. Talk to your care team about the use of this medication in children. While this medication may be prescribed for selected conditions, precautions do apply. Overdosage: If you think you have taken too much of this medicine contact a poison control center or emergency room at  once. NOTE: This medicine is only for you. Do not share this medicine with others. What if I miss a dose? It is important not to miss your dose. Call your care team if you miss your dose. If you miss a dose due to an On-body Injector failure or leakage, a new dose should be administered as soon as possible using a single prefilled syringe for manual use. What may interact with this medication? Interactions have not been studied. This list may not describe all possible interactions. Give your health care provider a list of all the medicines, herbs, non-prescription drugs, or dietary supplements you use. Also tell them if you smoke, drink alcohol, or use illegal drugs. Some items may interact with your medicine. What should I watch for while using this medication? Your condition will be monitored carefully while you are receiving this medication. You may need blood work done while you are taking this medication. Talk to your care team about your risk of cancer. You may be more at risk for certain types of cancer if you take this medication. If you are going to need a MRI, CT scan, or other procedure, tell your care team that you are using this medication (On-Body Injector only). What side effects may I notice from receiving this medication? Side effects that you should report to your care team as soon as possible: Allergic reactions--skin rash, itching, hives, swelling of the face, lips, tongue, or throat Capillary leak syndrome--stomach or muscle pain, unusual weakness or fatigue, feeling faint or lightheaded, decrease in the amount of urine, swelling of the ankles, hands, or feet, trouble breathing High  white blood cell level--fever, fatigue, trouble breathing, night sweats, change in vision, weight loss Inflammation of the aorta--fever, fatigue, back, chest, or stomach pain, severe headache Kidney injury (glomerulonephritis)--decrease in the amount of urine, red or dark brown urine, foamy or  bubbly urine, swelling of the ankles, hands, or feet Shortness of breath or trouble breathing Spleen injury--pain in upper left stomach or shoulder Unusual bruising or bleeding Side effects that usually do not require medical attention (report to your care team if they continue or are bothersome): Bone pain Pain in the hands or feet This list may not describe all possible side effects. Call your doctor for medical advice about side effects. You may report side effects to FDA at 1-800-FDA-1088. Where should I keep my medication? Keep out of the reach of children. If you are using this medication at home, you will be instructed on how to store it. Throw away any unused medication after the expiration date on the label. NOTE: This sheet is a summary. It may not cover all possible information. If you have questions about this medicine, talk to your doctor, pharmacist, or health care provider.  2022 Elsevier/Gold Standard (2020-12-17 00:00:00)  Rehydration, Adult Rehydration is the replacement of body fluids, salts, and minerals (electrolytes) that are lost during dehydration. Dehydration is when there is not enough water or other fluids in the body. This happens when you lose more fluids than you take in. Common causes of dehydration include: Not drinking enough fluids. This can occur when you are ill or doing activities that require a lot of energy, especially in hot weather. Conditions that cause loss of water or other fluids, such as diarrhea, vomiting, sweating, or urinating a lot. Other illnesses, such as fever or infection. Certain medicines, such as those that remove excess fluid from the body (diuretics). Symptoms of mild or moderate dehydration may include thirst, dry lips and mouth, and dizziness. Symptoms of severe dehydration may include increased heart rate, confusion, fainting, and not urinating. For severe dehydration, you may need to get fluids through an IV at the hospital. For  mild or moderate dehydration, you can usually rehydrate at home by drinking certain fluids as told by your health care provider. What are the risks? Generally, rehydration is safe. However, taking in too much fluid (overhydration) can be a problem. This is rare. Overhydration can cause an electrolyte imbalance, kidney failure, or a decrease in salt (sodium) levels in the body. Supplies needed You will need an oral rehydration solution (ORS) if your health care provider tells you to use one. This is a drink to treat dehydration. It can be found in pharmacies and retail stores. How to rehydrate Fluids Follow instructions from your health care provider for rehydration. The kind of fluid and the amount you should drink depend on your condition. In general, you should choose drinks that you prefer. If told by your health care provider, drink an ORS. Make an ORS by following instructions on the package. Start by drinking small amounts, about  cup (120 mL) every 5-10 minutes. Slowly increase how much you drink until you have taken the amount recommended by your health care provider. Drink enough clear fluids to keep your urine pale yellow. If you were told to drink an ORS, finish it first, then start slowly drinking other clear fluids. Drink fluids such as: Water. This includes sparkling water and flavored water. Drinking only water can lead to having too little sodium in your body (hyponatremia). Follow the advice of your  health care provider. Water from ice chips you suck on. Fruit juice with water you add to it (diluted). Sports drinks. Hot or cold herbal teas. Broth-based soups. Milk or milk products. Food Follow instructions from your health care provider about what to eat while you rehydrate. Your health care provider may recommend that you slowly begin eating regular foods in small amounts. Eat foods that contain a healthy balance of electrolytes, such as bananas, oranges, potatoes, tomatoes,  and spinach. Avoid foods that are greasy or contain a lot of sugar. In some cases, you may get nutrition through a feeding tube that is passed through your nose and into your stomach (nasogastric tube, or NG tube). This may be done if you have uncontrolled vomiting or diarrhea. Beverages to avoid Certain beverages may make dehydration worse. While you rehydrate, avoid drinking alcohol. How to tell if you are recovering from dehydration You may be recovering from dehydration if: You are urinating more often than before you started rehydrating. Your urine is pale yellow. Your energy level improves. You vomit less frequently. You have diarrhea less frequently. Your appetite improves or returns to normal. You feel less dizzy or less light-headed. Your skin tone and color start to look more normal. Follow these instructions at home: Take over-the-counter and prescription medicines only as told by your health care provider. Do not take sodium tablets. Doing this can lead to having too much sodium in your body (hypernatremia). Contact a health care provider if: You continue to have symptoms of mild or moderate dehydration, such as: Thirst. Dry lips. Slightly dry mouth. Dizziness. Dark urine or less urine than normal. Muscle cramps. You continue to vomit or have diarrhea. Get help right away if you: Have symptoms of dehydration that get worse. Have a fever. Have a severe headache. Have been vomiting and the following happens: Your vomiting gets worse or does not go away. Your vomit includes blood or green matter (bile). You cannot eat or drink without vomiting. Have problems with urination or bowel movements, such as: Diarrhea that gets worse or does not go away. Blood in your stool (feces). This may cause stool to look black and tarry. Not urinating, or urinating only a small amount of very dark urine, within 6-8 hours. Have trouble breathing. Have symptoms that get worse with  treatment. These symptoms may represent a serious problem that is an emergency. Do not wait to see if the symptoms will go away. Get medical help right away. Call your local emergency services (911 in the [place] Do not drive yourself to the hospital. Summary Rehydration is the replacement of body fluids and minerals (electrolytes) that are lost during dehydration. Follow instructions from your health care provider for rehydration. The kind of fluid and amount you should drink depend on your condition. Slowly increase how much you drink until you have taken the amount recommended by your health care provider. Contact your health care provider if you continue to show signs of mild or moderate dehydration. This information is not intended to replace advice given to you by your health care provider. Make sure you discuss any questions you have with your health care provider. Document Revised: 05/31/2019 Document Reviewed: 04/10/2019 Elsevier Patient Education  2022 [employer].

## 2021-03-21 ENCOUNTER — Inpatient Hospital Stay: Payer: Managed Care, Other (non HMO)

## 2021-03-21 VITALS — BP 123/79 | HR 93 | Temp 98.2°F | Resp 18

## 2021-03-21 DIAGNOSIS — Z5112 Encounter for antineoplastic immunotherapy: Secondary | ICD-10-CM | POA: Diagnosis not present

## 2021-03-21 DIAGNOSIS — Z95828 Presence of other vascular implants and grafts: Secondary | ICD-10-CM

## 2021-03-21 MED ORDER — HEPARIN SOD (PORK) LOCK FLUSH 100 UNIT/ML IV SOLN: 500.0000 [IU] | Freq: Once | INTRAVENOUS | Status: DC | PRN

## 2021-03-21 MED ORDER — ALTEPLASE 2 MG IJ SOLR: 2.0000 mg | Freq: Once | INTRAMUSCULAR | Status: DC | PRN

## 2021-03-21 MED ORDER — SODIUM CHLORIDE 0.9% FLUSH
10.0000 mL | Freq: Once | INTRAVENOUS | Status: AC | PRN
  Administered 2021-03-21: 10 mL

## 2021-03-21 MED ORDER — HEPARIN SOD (PORK) LOCK FLUSH 100 UNIT/ML IV SOLN
500.0000 [IU] | Freq: Once | INTRAVENOUS | Status: AC | PRN
  Administered 2021-03-21: 500 [IU]

## 2021-03-21 MED ORDER — SODIUM CHLORIDE 0.9 % IV SOLN: Freq: Once | INTRAVENOUS | Status: AC

## 2021-03-21 MED ORDER — SODIUM CHLORIDE 0.9% FLUSH: 10.0000 mL | INTRAVENOUS | Status: DC | PRN

## 2021-03-21 NOTE — Patient Instructions (Signed)

## 2021-03-22 ENCOUNTER — Other Ambulatory Visit: Payer: Self-pay

## 2021-03-22 ENCOUNTER — Inpatient Hospital Stay: Payer: Managed Care, Other (non HMO)

## 2021-03-22 VITALS — BP 136/74 | HR 77 | Temp 97.9°F | Resp 18

## 2021-03-22 DIAGNOSIS — Z5112 Encounter for antineoplastic immunotherapy: Secondary | ICD-10-CM | POA: Diagnosis not present

## 2021-03-22 DIAGNOSIS — Z95828 Presence of other vascular implants and grafts: Secondary | ICD-10-CM

## 2021-03-22 MED ORDER — HEPARIN SOD (PORK) LOCK FLUSH 100 UNIT/ML IV SOLN
500.0000 [IU] | Freq: Once | INTRAVENOUS | Status: AC | PRN
  Administered 2021-03-22: 500 [IU]

## 2021-03-22 MED ORDER — SODIUM CHLORIDE 0.9% FLUSH
10.0000 mL | Freq: Once | INTRAVENOUS | Status: AC | PRN
  Administered 2021-03-22: 10 mL

## 2021-03-22 MED ORDER — SODIUM CHLORIDE 0.9 % IV SOLN: Freq: Once | INTRAVENOUS | Status: AC

## 2021-03-22 NOTE — Patient Instructions (Signed)

## 2021-03-27 ENCOUNTER — Encounter: Payer: Self-pay | Admitting: Oncology

## 2021-03-30 ENCOUNTER — Other Ambulatory Visit: Payer: Self-pay

## 2021-04-01 ENCOUNTER — Ambulatory Visit (HOSPITAL_COMMUNITY): Payer: Managed Care, Other (non HMO)

## 2021-04-02 ENCOUNTER — Other Ambulatory Visit: Payer: Self-pay | Admitting: Oncology

## 2021-04-02 DIAGNOSIS — Z171 Estrogen receptor negative status [ER-]: Secondary | ICD-10-CM

## 2021-04-08 ENCOUNTER — Encounter: Payer: Self-pay | Admitting: *Deleted

## 2021-04-08 ENCOUNTER — Inpatient Hospital Stay (HOSPITAL_BASED_OUTPATIENT_CLINIC_OR_DEPARTMENT_OTHER): Payer: Managed Care, Other (non HMO)

## 2021-04-08 ENCOUNTER — Encounter: Payer: Self-pay | Admitting: Hematology and Oncology

## 2021-04-08 ENCOUNTER — Other Ambulatory Visit: Payer: Self-pay

## 2021-04-08 ENCOUNTER — Inpatient Hospital Stay: Payer: Managed Care, Other (non HMO)

## 2021-04-08 ENCOUNTER — Inpatient Hospital Stay: Payer: Managed Care, Other (non HMO) | Admitting: Adult Health

## 2021-04-08 ENCOUNTER — Inpatient Hospital Stay (HOSPITAL_BASED_OUTPATIENT_CLINIC_OR_DEPARTMENT_OTHER): Payer: Managed Care, Other (non HMO) | Admitting: Hematology and Oncology

## 2021-04-08 DIAGNOSIS — Z171 Estrogen receptor negative status [ER-]: Secondary | ICD-10-CM

## 2021-04-08 DIAGNOSIS — Z5112 Encounter for antineoplastic immunotherapy: Secondary | ICD-10-CM | POA: Diagnosis not present

## 2021-04-08 DIAGNOSIS — Z95828 Presence of other vascular implants and grafts: Secondary | ICD-10-CM

## 2021-04-08 DIAGNOSIS — C50411 Malignant neoplasm of upper-outer quadrant of right female breast: Secondary | ICD-10-CM

## 2021-04-08 LAB — CBC WITH DIFFERENTIAL (CANCER CENTER ONLY)
Abs Immature Granulocytes: 0.04 10*3/uL (ref 0.00–0.07)
Basophils Absolute: 0 10*3/uL (ref 0.0–0.1)
Basophils Relative: 0 %
Eosinophils Absolute: 0.1 10*3/uL (ref 0.0–0.5)
Eosinophils Relative: 1 %
HCT: 29 % — ABNORMAL LOW (ref 36.0–46.0)
Hemoglobin: 9.6 g/dL — ABNORMAL LOW (ref 12.0–15.0)
Immature Granulocytes: 1 %
Lymphocytes Relative: 12 %
Lymphs Abs: 0.6 10*3/uL — ABNORMAL LOW (ref 0.7–4.0)
MCH: 33.2 pg (ref 26.0–34.0)
MCHC: 33.1 g/dL (ref 30.0–36.0)
MCV: 100.3 fL — ABNORMAL HIGH (ref 80.0–100.0)
Monocytes Absolute: 0.5 10*3/uL (ref 0.1–1.0)
Monocytes Relative: 10 %
Neutro Abs: 3.7 10*3/uL (ref 1.7–7.7)
Neutrophils Relative %: 76 %
Platelet Count: 329 10*3/uL (ref 150–400)
RBC: 2.89 MIL/uL — ABNORMAL LOW (ref 3.87–5.11)
RDW: 15.2 % (ref 11.5–15.5)
WBC Count: 5 10*3/uL (ref 4.0–10.5)
nRBC: 0 % (ref 0.0–0.2)

## 2021-04-08 LAB — CMP (CANCER CENTER ONLY)
ALT: 11 U/L (ref 0–44)
AST: 13 U/L — ABNORMAL LOW (ref 15–41)
Albumin: 3.8 g/dL (ref 3.5–5.0)
Alkaline Phosphatase: 81 U/L (ref 38–126)
Anion gap: 8 (ref 5–15)
BUN: 9 mg/dL (ref 6–20)
CO2: 26 mmol/L (ref 22–32)
Calcium: 9.3 mg/dL (ref 8.9–10.3)
Chloride: 108 mmol/L (ref 98–111)
Creatinine: 0.64 mg/dL (ref 0.44–1.00)
GFR, Estimated: >60 mL/min (ref >60)
Glucose, Bld: 115 mg/dL — ABNORMAL HIGH (ref 70–99)
Potassium: 3.4 mmol/L — ABNORMAL LOW (ref 3.5–5.1)
Sodium: 142 mmol/L (ref 135–145)
Total Bilirubin: 0.3 mg/dL (ref 0.3–1.2)
Total Protein: 6.3 g/dL — ABNORMAL LOW (ref 6.5–8.1)

## 2021-04-08 LAB — TSH: TSH: 2.002 u[IU]/mL (ref 0.308–3.960)

## 2021-04-08 MED ORDER — SODIUM CHLORIDE 0.9 % IV SOLN: Freq: Once | INTRAVENOUS | Status: AC

## 2021-04-08 MED ORDER — SODIUM CHLORIDE 0.9 % IV SOLN
600.0000 mg/m2 | Freq: Once | INTRAVENOUS | Status: AC
  Administered 2021-04-08: 12:00:00 1080 mg via INTRAVENOUS
  Filled 2021-04-08: qty 54

## 2021-04-08 MED ORDER — SODIUM CHLORIDE 0.9% FLUSH
10.0000 mL | INTRAVENOUS | Status: DC | PRN
  Administered 2021-04-08: 09:00:00 10 mL

## 2021-04-08 MED ORDER — PALONOSETRON HCL INJECTION 0.25 MG/5ML
0.2500 mg | Freq: Once | INTRAVENOUS | Status: AC
  Administered 2021-04-08: 10:00:00 0.25 mg via INTRAVENOUS
  Filled 2021-04-08: qty 5

## 2021-04-08 MED ORDER — SODIUM CHLORIDE 0.9 % IV SOLN
150.0000 mg | Freq: Once | INTRAVENOUS | Status: AC
  Administered 2021-04-08: 10:00:00 150 mg via INTRAVENOUS
  Filled 2021-04-08: qty 150

## 2021-04-08 MED ORDER — DOXORUBICIN HCL CHEMO IV INJECTION 2 MG/ML
60.0000 mg/m2 | Freq: Once | INTRAVENOUS | Status: AC
  Administered 2021-04-08: 12:00:00 108 mg via INTRAVENOUS
  Filled 2021-04-08: qty 54

## 2021-04-08 MED ORDER — HEPARIN SOD (PORK) LOCK FLUSH 100 UNIT/ML IV SOLN
500.0000 [IU] | Freq: Once | INTRAVENOUS | Status: AC | PRN
  Administered 2021-04-08: 13:00:00 500 [IU]

## 2021-04-08 MED ORDER — SODIUM CHLORIDE 0.9 % IV SOLN
200.0000 mg | Freq: Once | INTRAVENOUS | Status: AC
  Administered 2021-04-08: 11:00:00 200 mg via INTRAVENOUS
  Filled 2021-04-08: qty 8

## 2021-04-08 MED ORDER — DEXAMETHASONE 4 MG PO TABS: ORAL_TABLET | ORAL | 1 refills | Status: DC

## 2021-04-08 MED ORDER — FLUCONAZOLE 200 MG PO TABS: 200.0000 mg | ORAL_TABLET | Freq: Every day | ORAL | 1 refills | Status: DC

## 2021-04-08 MED ORDER — SODIUM CHLORIDE 0.9% FLUSH
10.0000 mL | INTRAVENOUS | Status: DC | PRN
  Administered 2021-04-08: 13:00:00 10 mL

## 2021-04-08 MED ORDER — SODIUM CHLORIDE 0.9 % IV SOLN
10.0000 mg | Freq: Once | INTRAVENOUS | Status: AC
  Administered 2021-04-08: 10:00:00 10 mg via INTRAVENOUS
  Filled 2021-04-08: qty 10

## 2021-04-08 NOTE — Progress Notes (Signed)
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  She is accompanied by her husband today.  She is receiving neoadjuvant chemotherapy and is currently receiving Doxorubicin, Cyclophosphamide and Pembrolizumab every 21 days x 4 cycles.  Today is last day of chemotherapy.  Since her last visit, she continues to have issues with thrush and she takes Diflucan as prescribed.  She also has been having some mouth sores and uses Magic mouthwash.  Nausea is well controlled with antinausea medication.  She has noticed some right leg radiating pain, 2 out of 10, describes as sciatica and she denies any need for pain medication at this time.  She denies any diarrhea.  No new no more neuropathy.  She has a follow-up with breast surgery in first week of January and an MRI planned in the next 3 days.  She is excited about the prospect of completing chemotherapy.  We are following her TSH since she is receiving Pembrolizumab, levels are noted below.  Lab Results  Component Value Date   TSH 2.002 04/08/2021   TSH 2.604 03/18/2021   TSH 1.498 02/25/2021   TSH 1.474 02/11/2021   TSH 3.950 02/04/2021    REVIEW OF SYSTEMS:  Rest of the pertinent 10 point ROS reviewed and negative except as mentioned above  COVID 19  VACCINATION STATUS: Robin Farley x2, most recently 11/2019; infection 07/2020   HISTORY OF CURRENT ILLNESS:   Cancer Staging  Malignant neoplasm of upper-outer quadrant of right breast in female, estrogen receptor negative (Robin Farley) Staging form: Breast, AJCC 8th Edition - Clinical: Stage IIB (cT1c, cN1, cM0, G3, ER-, PR-, HER2-) - Signed by [name], MD on 11/20/2020 Histologic grading system: 3 grade system  55 y.o. Robin Farley woman status post right breast upper outer quadrant biopsy 11/08/2020 for a clinical T1c N1, stage IIB invasive ductal carcinoma, grade 3, functionally triple negative, with an MIB-1 of 30%  (a) right breast retroareolar biopsy 12/09/2020 shows invasive ductal carcinoma, grade 2  (1) genetics test 12/03/2020 through the [place] +RNAinsight Panel found no deleterious mutations in AIP, ALK, APC, ATM, AXIN2, BAP1, BARD1, BLM, BMPR1A, BRCA1, BRCA2, BRIP1, CDC73, CDH1, CDK4, CDKN1B, CDKN2A, CHEK2, CTNNA1, DICER1, FANCC, FH, FLCN, GALNT12, KIF1B, LZTR1, MAX, MEN1, MET, MLH1, MSH2, MSH3, MSH6, MUTYH, NBN, NF1, NF2, NTHL1, PALB2, PHOX2B, PMS2, POT1, PRKAR1A, PTCH1, PTEN, RAD51C, RAD51D, RB1, RECQL, RET, SDHA, SDHAF2, SDHB, SDHC, SDHD, SMAD4, SMARCA4, SMARCB1, SMARCE1, STK11, SUFU, TMEM127, TP53, TSC1, TSC2, VHL and XRCC2 (sequencing and deletion/duplication); EGFR, EGLN1, HOXB13, KIT, MITF, PDGFRA, POLD1, and POLE (sequencing only); EPCAM and GREM1 (deletion/duplication only).    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   PAST MEDICAL HISTORY: Past Medical History:  Diagnosis Date   Abdominal pain, generalized 12/27/2008   Acute sinusitis, unspecified 02/26/2007   ADD 03/13/2010   ALLERGIC RHINITIS 02/23/2007   ANEMIA 02/05/2007   Anxiety 11/17/2011   ATTENTION DEFICIT DISORDER, HX OF 02/05/2007   Breast cancer (HCC)    Deviated nasal septum 02/05/2007   Diabetes mellitus without complication (HCC)    Family history of breast cancer 11/20/2020   Family history of melanoma 11/20/2020   FATIGUE 02/23/2007   HYPERLIPIDEMIA 02/23/2007   Personal history of malignant melanoma 11/20/2020   SWELLING MASS OR LUMP IN HEAD AND NECK 07/27/2008   UTI 02/23/2007    PAST SURGICAL HISTORY: Past Surgical History:  Procedure Laterality Date   BROW LIFT     CESAREAN SECTION     COSMETIC SURGERY  1995   Nasal   MELANOMA EXCISION WITH SENTINEL LYMPH NODE BIOPSY Left 10/24/2012   Procedure: MELANOMA wide EXCISION left lateral thigh WITH SENTINEL LYMPH NODE BIOPSY left groin;  Surgeon: Mariella Saa, MD;  Location: [facility];  Service: General;  Laterality: Left;   NASAL SEPTUM SURGERY     PORTACATH PLACEMENT Right 12/02/2020   Procedure: INSERTION PORT-A-CATH;  Surgeon: Manus Rudd, MD;  Location: [facility];  Service: General;  Laterality: Right;    FAMILY HISTORY: Family History  Problem Relation Age of Onset   Hypertension Mother    Breast cancer Mother 75   COPD Father    Atrial fibrillation Father    Atrial fibrillation Sister    Kidney cancer Maternal Aunt        dx 60s   Leukemia Paternal Aunt        dx after 56   Cancer Paternal Aunt        unknown type; dx after 59   Brain cancer Paternal Grandfather 40       brain tumor per pt   Melanoma Cousin        arm; no mets per pt   Alcohol abuse Other    Depression Other     Glaucoma Other    Breast cancer Other        MGM's sisters, x3, dx after 7   Colon polyps Neg Hx    Her father died at age 45 from COPD. Her mother is currently 44 years old as of 11/2020. Robin Farley has no full siblings and three half-siblings-- two brothers and one sister. She reports breast cancer in her mother and three great-aunts, brain tumor in her paternal grandfather, and metastatic melanoma in a cousin.   GYNECOLOGIC HISTORY:  Patient's last menstrual period was 04/27/2016. Menarche: [age] years old Age at first live birth: 55 years old GX P 2 LMP 2016 Contraceptive used for 1-2 years HRT estrogen cream irregularly Hysterectomy? no BSO? 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 She lives in [place], [place] but travels here frequently for work. Husband Trey Paula works in Airline pilot for [employer]. She lives at home with Trey Paula and their daughter Ellyn Hack, who is 63 and is a Consulting civil engineer at [employer]. Daughter Bevelyn Ngo, age 59, is a Administrator, Civil Service in [place]. [place]. Ameia is not a Actor.  ADVANCED DIRECTIVES: In the absence of any documentation to the contrary, the patient's spouse is their HCPOA.    HEALTH MAINTENANCE: Social History   Tobacco Use   Smoking status: Never   Smokeless tobacco: Never  Substance Use Topics   Alcohol use: Yes    Comment: Very rare occasions   Drug use: Never     Colonoscopy: 12/2014 (Dr. Henrene Pastor)  PAP: 2020  Bone density: unsure   Allergies  Allergen Reactions   Promethazine Hcl     Legs twitch    Current Outpatient Medications  Medication Sig Dispense Refill   atorvastatin (LIPITOR) 20 MG tablet Take 1 tablet (20 mg total) by mouth daily. 90 tablet 3   Cholecalciferol (THERA-D 2000) 50 MCG (2000 UT) TABS 1 tab by mouth once daily 30 tablet 99   dexamethasone (DECADRON) 4 MG tablet Take 2 tablets once a day for 3 days after carboplatin and AC  chemotherapy. Take with food. 30 tablet 1   Emollient (ROC RETINOL CORREXION EYE) CREA Use as directed daily 15 mL 11   fluconazole (DIFLUCAN) 200 MG tablet Take 1 tablet (200 mg total) by mouth daily. 30 tablet 1   lidocaine-prilocaine (EMLA) cream Apply to affected area once 30 g 3   magic mouthwash (nystatin, lidocaine, diphenhydrAMINE, alum & mag hydroxide) suspension Swish 5 mLs and spit out 4 times a day as needed before meals and at bedtime. 240 mL 0   metFORMIN (GLUCOPHAGE-XR) 500 MG 24 hr tablet Take 1 tablet (500 mg total) by mouth daily with breakfast. 90 tablet 3   mirtazapine (REMERON) 7.5 MG tablet Take 1 tablet (7.5 mg total) by mouth at bedtime. 60 tablet 6   ondansetron (ZOFRAN) 8 MG tablet Take 1 tablet (8 mg total) by mouth every 8 (eight) hours as needed for nausea or vomiting. 20 tablet 3   prochlorperazine (COMPAZINE) 10 MG tablet Take 1 tablet (10 mg total) by mouth every 6 (six) hours as needed (Nausea or vomiting). 30 tablet 1   valACYclovir (VALTREX) 1000 MG tablet TAKE 1 TABLET BY MOUTH TWICE A DAY 60 tablet 1   valACYclovir (VALTREX) 500 MG tablet TAKE 1 TABLET (500 MG TOTAL) BY MOUTH DAILY. 90 tablet 1   zolpidem (AMBIEN) 5 MG tablet Take 1 tablet (5 mg total) by mouth at bedtime as needed for sleep. 20 tablet 0   No current facility-administered medications for this visit.   Facility-Administered Medications Ordered in Other Visits  Medication Dose Route Frequency Provider Last Rate Last Admin   cyclophosphamide (CYTOXAN) 1,080 mg in sodium chloride 0.9 % 250 mL chemo infusion  600 mg/m2 (Treatment Plan Recorded) Intravenous Once Magrinat, Virgie Dad, MD       DOXOrubicin (ADRIAMYCIN) chemo injection 108 mg  60 mg/m2 (Treatment Plan Recorded) Intravenous Once Magrinat, Virgie Dad, MD       heparin lock flush 100 unit/mL  500 Units Intracatheter Once PRN Magrinat, Virgie Dad, MD       pembrolizumab [facility]) 200 mg in sodium chloride 0.9 % 50 mL chemo infusion  200 mg  Intravenous Once Magrinat, Virgie Dad, MD       sodium chloride flush (NS) 0.9 % injection 10 mL  10 mL Intracatheter PRN Magrinat, Virgie Dad, MD   10 mL at 01/21/21 1144   sodium chloride flush (NS) 0.9 % injection 10 mL  10 mL Intracatheter PRN Magrinat, Virgie Dad, MD   10 mL at 03/20/21 1159   sodium chloride flush (NS) 0.9 % injection  10 mL  10 mL Intracatheter PRN Magrinat, Virgie Dad, MD        OBJECTIVE: White woman who appears younger than stated age  Vitals:   04/08/21 0853  BP: 116/79  Pulse: 95  Resp: 16  Temp: 98.1 F (36.7 C)  SpO2: 99%       Body mass index is 23.77 kg/m.   Wt Readings from Last 3 Encounters:  04/08/21 147 lb 4.8 oz (66.8 kg)  03/18/21 151 lb 4.8 oz (68.6 kg)  02/25/21 161 lb (73 kg)     ECOG FS:1 - Symptomatic but completely ambulatory  Sclerae unicteric, EOMs intact, appears tired Oropharynx is clear, no signs of thrush.  No mouth ulcers noted today. No cervical or supraclavicular adenopathy Lungs no rales or rhonchi Heart regular rate and rhythm MSK no focal spinal tenderness, no upper extremity lymphedema Neuro: nonfocal, well oriented, appropriate affect Breasts: Mass in the right breast is not well-defined at this point, there appears to be an area of architectural distortion in the right breast inner lower quadrant but no sizable mass.  No palpable regional adenopathy.  LAB RESULTS:  CMP     Component Value Date/Time   NA 142 04/08/2021 0837   K 3.4 (L) 04/08/2021 0837   CL 108 04/08/2021 0837   CO2 26 04/08/2021 0837   GLUCOSE 115 (H) 04/08/2021 0837   BUN 9 04/08/2021 0837   CREATININE 0.64 04/08/2021 0837   CALCIUM 9.3 04/08/2021 0837   PROT 6.3 (L) 04/08/2021 0837   ALBUMIN 3.8 04/08/2021 0837   AST 13 (L) 04/08/2021 0837   ALT 11 04/08/2021 0837   ALKPHOS 81 04/08/2021 0837   BILITOT 0.3 04/08/2021 0837   GFRNONAA >60 04/08/2021 0837   GFRAA 100 03/23/2008 1212    No results found for: TOTALPROTELP, ALBUMINELP, A1GS,  A2GS, BETS, BETA2SER, GAMS, MSPIKE, SPEI  Lab Results  Component Value Date   WBC 5.0 04/08/2021   NEUTROABS 3.7 04/08/2021   HGB 9.6 (L) 04/08/2021   HCT 29.0 (L) 04/08/2021   MCV 100.3 (H) 04/08/2021   PLT 329 04/08/2021    No results found for: LABCA2  No components found for: [id number]  No results for input(s): INR in the last 168 hours.  No results found for: LABCA2  No results found for: CBJ628  No results found for: BTD176  No results found for: HYW737  No results found for: CA2729  No components found for: HGQUANT  No results found for: CEA1 / No results found for: CEA1   No results found for: AFPTUMOR  No results found for: CHROMOGRNA  No results found for: KPAFRELGTCHN, LAMBDASER, KAPLAMBRATIO (kappa/lambda light chains)  No results found for: HGBA, HGBA2QUANT, HGBFQUANT, HGBSQUAN (Hemoglobinopathy evaluation)   No results found for: LDH  Lab Results  Component Value Date   IRON 93 12/16/2018   IRONPCTSAT 25.6 12/16/2018   (Iron and TIBC)  No results found for: FERRITIN  Urinalysis    Component Value Date/Time   COLORURINE YELLOW 12/20/2019 1521   APPEARANCEUR CLEAR 12/20/2019 1521   LABSPEC 1.017 12/20/2019 1521   PHURINE 6.5 12/20/2019 1521   GLUCOSEU NEGATIVE 12/20/2019 1521   GLUCOSEU NEGATIVE 12/16/2018 1512   HGBUR NEGATIVE 12/20/2019 1521   BILIRUBINUR NEGATIVE 12/16/2018 1512   KETONESUR NEGATIVE 12/20/2019 1521   PROTEINUR NEGATIVE 12/20/2019 1521   UROBILINOGEN 0.2 12/16/2018 1512   NITRITE NEGATIVE 12/20/2019 1521   LEUKOCYTESUR NEGATIVE 12/20/2019 1521    STUDIES: No results found.   ELIGIBLE FOR  AVAILABLE RESEARCH PROTOCOL: no  ASSESSMENT: 55 y.o. Robin Avon woman with T1c N1, stage IIB invasive ductal carcinoma, grade 3, functionally triple negative, with an MIB-1 of 30%   (1) genetics test 12/03/2020 through the [place] +RNAinsight Panel found no deleterious mutations in AIP, ALK, APC, ATM,  AXIN2, BAP1, BARD1, BLM, BMPR1A, BRCA1, BRCA2, BRIP1, CDC73, CDH1, CDK4, CDKN1B, CDKN2A, CHEK2, CTNNA1, DICER1, FANCC, FH, FLCN, GALNT12, KIF1B, LZTR1, MAX, MEN1, MET, MLH1, MSH2, MSH3, MSH6, MUTYH, NBN, NF1, NF2, NTHL1, PALB2, PHOX2B, PMS2, POT1, PRKAR1A, PTCH1, PTEN, RAD51C, RAD51D, RB1, RECQL, RET, SDHA, SDHAF2, SDHB, SDHC, SDHD, SMAD4, SMARCA4, SMARCB1, SMARCE1, STK11, SUFU, TMEM127, TP53, TSC1, TSC2, VHL and XRCC2 (sequencing and deletion/duplication); EGFR, EGLN1, HOXB13, KIT, MITF, PDGFRA, POLD1, and POLE (sequencing only); EPCAM and GREM1 (deletion/duplication only).    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  PLAN:  Ms. Loseke is here for follow-up before planned last cycle of Adriamycin, cyclophosphamide and Keytruda.  Since last visit, she continues to struggle with some thrush for which she takes Diflucan.  Mucositis is reasonably well controlled with the Magic mouthwash.  Nausea is well controlled with the current nausea regimen and dexamethasone. She is very excited that she is done with chemotherapy.  She has an MRI planned for 1230 and follow-up with Dr. Georgette Dover on April 18, 2021.  We do not have a surgical date yet.  At this time physical examination appears to have confirmed ongoing response, no well-defined palpable mass in the right breast except for some ill-defined area of architectural distortion. I have discussed that according to this regimen, she will move forward with surgery and will receive Keytruda after definitive surgery every 3 weeks for up to 9 cycles.  She  will also have to proceed with adjuvant radiation. She is weakly ER positive hence we will most likely start her on adjuvant endocrine therapy however she may not derive much benefit from this. I spent over 40 minutes reviewing her past history, reviewing her records since she is a new patient to me, discussing about plan of care, anticipated response with this particular regimen and recommendations for her ongoing adverse effects. She will return to clinic with me in first week of February.  She may need an interim visit with Mendel Ryder for monitoring of her counts.  *Total Encounter Time as defined by the Centers for Medicare and Medicaid Services includes, in addition to the face-to-face time of a patient visit (documented in the note above) non-face-to-face time: obtaining and reviewing outside history, ordering and reviewing medications, tests or procedures, care coordination (communications with other health care professionals or caregivers) and documentation in the medical record.

## 2021-04-10 ENCOUNTER — Inpatient Hospital Stay: Payer: Managed Care, Other (non HMO)

## 2021-04-10 ENCOUNTER — Telehealth: Payer: Self-pay | Admitting: *Deleted

## 2021-04-10 ENCOUNTER — Other Ambulatory Visit: Payer: Self-pay

## 2021-04-10 ENCOUNTER — Ambulatory Visit: Payer: Managed Care, Other (non HMO)

## 2021-04-10 VITALS — BP 124/81 | HR 79 | Temp 98.0°F | Resp 16

## 2021-04-10 DIAGNOSIS — Z5112 Encounter for antineoplastic immunotherapy: Secondary | ICD-10-CM | POA: Diagnosis not present

## 2021-04-10 DIAGNOSIS — C50411 Malignant neoplasm of upper-outer quadrant of right female breast: Secondary | ICD-10-CM

## 2021-04-10 DIAGNOSIS — Z95828 Presence of other vascular implants and grafts: Secondary | ICD-10-CM

## 2021-04-10 MED ORDER — PEGFILGRASTIM-BMEZ 6 MG/0.6ML ~~LOC~~ SOSY
6.0000 mg | PREFILLED_SYRINGE | Freq: Once | SUBCUTANEOUS | Status: AC
  Administered 2021-04-10: 11:00:00 6 mg via SUBCUTANEOUS
  Filled 2021-04-10: qty 0.6

## 2021-04-10 MED ORDER — SODIUM CHLORIDE 0.9 % IV SOLN: Freq: Once | INTRAVENOUS | Status: AC

## 2021-04-10 NOTE — Telephone Encounter (Signed)
Attempting to connect with [name] regarding a new [id number] form found astray today in bin designated for Leave and Disability form folders.   Typed form reads "Karis Juba" without further information.   Form not logged to forms drop off tracking sheet also without [facility] Disability/FMLA cover sheet.   Currently unable to confirm identify patient or FMLA needs without date of birth or patient demographics.     Message left requesting return call to direct extension to inquire and discuss. Will connect 04/11/2021 during two hour infusion scheduled 0915 if no return call received.

## 2021-04-10 NOTE — Telephone Encounter (Signed)
-----   Message from Merril Abbe, LPN sent at [phone]/22/[phone]  4:18 PM EST -----  ----- Message ----- From: [name], NP Sent: 04/08/2021   9:20 AM EST To: [facility]  Potassium is slightly low please recommend increase potassium intake. ----- Message ----- From: Buel Ream, Lab [facility] [place] Sent: 04/08/2021   8:49 AM EST To: [name], NP

## 2021-04-10 NOTE — Patient Instructions (Signed)
Pegfilgrastim Injection What is this medication? PEGFILGRASTIM (PEG fil gra stim) lowers the risk of infection in people who are receiving chemotherapy. It works by Building control surveyor make more white blood cells, which protects your body from infection. It may also be used to help people who have been exposed to high doses of radiation. This medicine may be used for other purposes; ask your health care provider or pharmacist if you have questions. COMMON BRAND NAME(S): Rexene Edison, Ziextenzo What should I tell my care team before I take this medication? They need to know if you have any of these conditions: Kidney disease Latex allergy Ongoing radiation therapy Sickle cell disease Skin reactions to acrylic adhesives (On-Body Injector only) An unusual or allergic reaction to pegfilgrastim, filgrastim, other medications, foods, dyes, or preservatives Pregnant or trying to get pregnant Breast-feeding How should I use this medication? This medication is for injection under the skin. If you get this medication at home, you will be taught how to prepare and give the pre-filled syringe or how to use the On-body Injector. Refer to the patient Instructions for Use for detailed instructions. Use exactly as directed. Tell your care team immediately if you suspect that the On-body Injector may not have performed as intended or if you suspect the use of the On-body Injector resulted in a missed or partial dose. It is important that you put your used needles and syringes in a special sharps container. Do not put them in a trash can. If you do not have a sharps container, call your pharmacist or care team to get one. Talk to your care team about the use of this medication in children. While this medication may be prescribed for selected conditions, precautions do apply. Overdosage: If you think you have taken too much of this medicine contact a poison control center or emergency room at  once. NOTE: This medicine is only for you. Do not share this medicine with others. What if I miss a dose? It is important not to miss your dose. Call your care team if you miss your dose. If you miss a dose due to an On-body Injector failure or leakage, a new dose should be administered as soon as possible using a single prefilled syringe for manual use. What may interact with this medication? Interactions have not been studied. This list may not describe all possible interactions. Give your health care provider a list of all the medicines, herbs, non-prescription drugs, or dietary supplements you use. Also tell them if you smoke, drink alcohol, or use illegal drugs. Some items may interact with your medicine. What should I watch for while using this medication? Your condition will be monitored carefully while you are receiving this medication. You may need blood work done while you are taking this medication. Talk to your care team about your risk of cancer. You may be more at risk for certain types of cancer if you take this medication. If you are going to need a MRI, CT scan, or other procedure, tell your care team that you are using this medication (On-Body Injector only). What side effects may I notice from receiving this medication? Side effects that you should report to your care team as soon as possible: Allergic reactions--skin rash, itching, hives, swelling of the face, lips, tongue, or throat Capillary leak syndrome--stomach or muscle pain, unusual weakness or fatigue, feeling faint or lightheaded, decrease in the amount of urine, swelling of the ankles, hands, or feet, trouble breathing High  white blood cell level--fever, fatigue, trouble breathing, night sweats, change in vision, weight loss Inflammation of the aorta--fever, fatigue, back, chest, or stomach pain, severe headache Kidney injury (glomerulonephritis)--decrease in the amount of urine, red or dark brown urine, foamy or  bubbly urine, swelling of the ankles, hands, or feet Shortness of breath or trouble breathing Spleen injury--pain in upper left stomach or shoulder Unusual bruising or bleeding Side effects that usually do not require medical attention (report to your care team if they continue or are bothersome): Bone pain Pain in the hands or feet This list may not describe all possible side effects. Call your doctor for medical advice about side effects. You may report side effects to FDA at 1-800-FDA-1088. Where should I keep my medication? Keep out of the reach of children. If you are using this medication at home, you will be instructed on how to store it. Throw away any unused medication after the expiration date on the label. NOTE: This sheet is a summary. It may not cover all possible information. If you have questions about this medicine, talk to your doctor, pharmacist, or health care provider.  2022 Elsevier/Gold Standard (2020-12-17 00:00:00)  Rehydration, Adult Rehydration is the replacement of body fluids, salts, and minerals (electrolytes) that are lost during dehydration. Dehydration is when there is not enough water or other fluids in the body. This happens when you lose more fluids than you take in. Common causes of dehydration include: Not drinking enough fluids. This can occur when you are ill or doing activities that require a lot of energy, especially in hot weather. Conditions that cause loss of water or other fluids, such as diarrhea, vomiting, sweating, or urinating a lot. Other illnesses, such as fever or infection. Certain medicines, such as those that remove excess fluid from the body (diuretics). Symptoms of mild or moderate dehydration may include thirst, dry lips and mouth, and dizziness. Symptoms of severe dehydration may include increased heart rate, confusion, fainting, and not urinating. For severe dehydration, you may need to get fluids through an IV at the hospital. For  mild or moderate dehydration, you can usually rehydrate at home by drinking certain fluids as told by your health care provider. What are the risks? Generally, rehydration is safe. However, taking in too much fluid (overhydration) can be a problem. This is rare. Overhydration can cause an electrolyte imbalance, kidney failure, or a decrease in salt (sodium) levels in the body. Supplies needed You will need an oral rehydration solution (ORS) if your health care provider tells you to use one. This is a drink to treat dehydration. It can be found in pharmacies and retail stores. How to rehydrate Fluids Follow instructions from your health care provider for rehydration. The kind of fluid and the amount you should drink depend on your condition. In general, you should choose drinks that you prefer. If told by your health care provider, drink an ORS. Make an ORS by following instructions on the package. Start by drinking small amounts, about  cup (120 mL) every 5-10 minutes. Slowly increase how much you drink until you have taken the amount recommended by your health care provider. Drink enough clear fluids to keep your urine pale yellow. If you were told to drink an ORS, finish it first, then start slowly drinking other clear fluids. Drink fluids such as: Water. This includes sparkling water and flavored water. Drinking only water can lead to having too little sodium in your body (hyponatremia). Follow the advice of your  health care provider. Water from ice chips you suck on. Fruit juice with water you add to it (diluted). Sports drinks. Hot or cold herbal teas. Broth-based soups. Milk or milk products. Food Follow instructions from your health care provider about what to eat while you rehydrate. Your health care provider may recommend that you slowly begin eating regular foods in small amounts. Eat foods that contain a healthy balance of electrolytes, such as bananas, oranges, potatoes, tomatoes,  and spinach. Avoid foods that are greasy or contain a lot of sugar. In some cases, you may get nutrition through a feeding tube that is passed through your nose and into your stomach (nasogastric tube, or NG tube). This may be done if you have uncontrolled vomiting or diarrhea. Beverages to avoid Certain beverages may make dehydration worse. While you rehydrate, avoid drinking alcohol. How to tell if you are recovering from dehydration You may be recovering from dehydration if: You are urinating more often than before you started rehydrating. Your urine is pale yellow. Your energy level improves. You vomit less frequently. You have diarrhea less frequently. Your appetite improves or returns to normal. You feel less dizzy or less light-headed. Your skin tone and color start to look more normal. Follow these instructions at home: Take over-the-counter and prescription medicines only as told by your health care provider. Do not take sodium tablets. Doing this can lead to having too much sodium in your body (hypernatremia). Contact a health care provider if: You continue to have symptoms of mild or moderate dehydration, such as: Thirst. Dry lips. Slightly dry mouth. Dizziness. Dark urine or less urine than normal. Muscle cramps. You continue to vomit or have diarrhea. Get help right away if you: Have symptoms of dehydration that get worse. Have a fever. Have a severe headache. Have been vomiting and the following happens: Your vomiting gets worse or does not go away. Your vomit includes blood or green matter (bile). You cannot eat or drink without vomiting. Have problems with urination or bowel movements, such as: Diarrhea that gets worse or does not go away. Blood in your stool (feces). This may cause stool to look black and tarry. Not urinating, or urinating only a small amount of very dark urine, within 6-8 hours. Have trouble breathing. Have symptoms that get worse with  treatment. These symptoms may represent a serious problem that is an emergency. Do not wait to see if the symptoms will go away. Get medical help right away. Call your local emergency services (911 in the [place] Do not drive yourself to the hospital. Summary Rehydration is the replacement of body fluids and minerals (electrolytes) that are lost during dehydration. Follow instructions from your health care provider for rehydration. The kind of fluid and amount you should drink depend on your condition. Slowly increase how much you drink until you have taken the amount recommended by your health care provider. Contact your health care provider if you continue to show signs of mild or moderate dehydration. This information is not intended to replace advice given to you by your health care provider. Make sure you discuss any questions you have with your health care provider. Document Revised: 05/31/2019 Document Reviewed: 04/10/2019 Elsevier Patient Education  2022 [place].  Rehydration, Adult Rehydration is the replacement of body fluids, salts, and minerals (electrolytes) that are lost during dehydration. Dehydration is when there is not enough water or other fluids in the body. This happens when you lose more fluids than you take in. Common  causes of dehydration include: Not drinking enough fluids. This can occur when you are ill or doing activities that require a lot of energy, especially in hot weather. Conditions that cause loss of water or other fluids, such as diarrhea, vomiting, sweating, or urinating a lot. Other illnesses, such as fever or infection. Certain medicines, such as those that remove excess fluid from the body (diuretics). Symptoms of mild or moderate dehydration may include thirst, dry lips and mouth, and dizziness. Symptoms of severe dehydration may include increased heart rate, confusion, fainting, and not urinating. For severe dehydration, you may need to get fluids  through an IV at the hospital. For mild or moderate dehydration, you can usually rehydrate at home by drinking certain fluids as told by your health care provider. What are the risks? Generally, rehydration is safe. However, taking in too much fluid (overhydration) can be a problem. This is rare. Overhydration can cause an electrolyte imbalance, kidney failure, or a decrease in salt (sodium) levels in the body. Supplies needed You will need an oral rehydration solution (ORS) if your health care provider tells you to use one. This is a drink to treat dehydration. It can be found in pharmacies and retail stores. How to rehydrate Fluids Follow instructions from your health care provider for rehydration. The kind of fluid and the amount you should drink depend on your condition. In general, you should choose drinks that you prefer. If told by your health care provider, drink an ORS. Make an ORS by following instructions on the package. Start by drinking small amounts, about  cup (120 mL) every 5-10 minutes. Slowly increase how much you drink until you have taken the amount recommended by your health care provider. Drink enough clear fluids to keep your urine pale yellow. If you were told to drink an ORS, finish it first, then start slowly drinking other clear fluids. Drink fluids such as: Water. This includes sparkling water and flavored water. Drinking only water can lead to having too little sodium in your body (hyponatremia). Follow the advice of your health care provider. Water from ice chips you suck on. Fruit juice with water you add to it (diluted). Sports drinks. Hot or cold herbal teas. Broth-based soups. Milk or milk products. Food Follow instructions from your health care provider about what to eat while you rehydrate. Your health care provider may recommend that you slowly begin eating regular foods in small amounts. Eat foods that contain a healthy balance of electrolytes, such as  bananas, oranges, potatoes, tomatoes, and spinach. Avoid foods that are greasy or contain a lot of sugar. In some cases, you may get nutrition through a feeding tube that is passed through your nose and into your stomach (nasogastric tube, or NG tube). This may be done if you have uncontrolled vomiting or diarrhea. Beverages to avoid Certain beverages may make dehydration worse. While you rehydrate, avoid drinking alcohol. How to tell if you are recovering from dehydration You may be recovering from dehydration if: You are urinating more often than before you started rehydrating. Your urine is pale yellow. Your energy level improves. You vomit less frequently. You have diarrhea less frequently. Your appetite improves or returns to normal. You feel less dizzy or less light-headed. Your skin tone and color start to look more normal. Follow these instructions at home: Take over-the-counter and prescription medicines only as told by your health care provider. Do not take sodium tablets. Doing this can lead to having too much sodium in your  body (hypernatremia). Contact a health care provider if: You continue to have symptoms of mild or moderate dehydration, such as: Thirst. Dry lips. Slightly dry mouth. Dizziness. Dark urine or less urine than normal. Muscle cramps. You continue to vomit or have diarrhea. Get help right away if you: Have symptoms of dehydration that get worse. Have a fever. Have a severe headache. Have been vomiting and the following happens: Your vomiting gets worse or does not go away. Your vomit includes blood or green matter (bile). You cannot eat or drink without vomiting. Have problems with urination or bowel movements, such as: Diarrhea that gets worse or does not go away. Blood in your stool (feces). This may cause stool to look black and tarry. Not urinating, or urinating only a small amount of very dark urine, within 6-8 hours. Have trouble  breathing. Have symptoms that get worse with treatment. These symptoms may represent a serious problem that is an emergency. Do not wait to see if the symptoms will go away. Get medical help right away. Call your local emergency services (911 in the [place] Do not drive yourself to the hospital. Summary Rehydration is the replacement of body fluids and minerals (electrolytes) that are lost during dehydration. Follow instructions from your health care provider for rehydration. The kind of fluid and amount you should drink depend on your condition. Slowly increase how much you drink until you have taken the amount recommended by your health care provider. Contact your health care provider if you continue to show signs of mild or moderate dehydration. This information is not intended to replace advice given to you by your health care provider. Make sure you discuss any questions you have with your health care provider. Document Revised: 05/31/2019 Document Reviewed: 04/10/2019 Elsevier Patient Education  2022 [employer].

## 2021-04-10 NOTE — Telephone Encounter (Signed)
Called pt with message below. Recommended potassium rich foods. Pt verbalized understanding.

## 2021-04-11 ENCOUNTER — Inpatient Hospital Stay: Payer: Managed Care, Other (non HMO)

## 2021-04-11 ENCOUNTER — Ambulatory Visit (HOSPITAL_COMMUNITY)
Admission: RE | Admit: 2021-04-11 | Discharge: 2021-04-11 | Disposition: A | Discharge status: Home / Self Care | Payer: Managed Care, Other (non HMO) | Source: Ambulatory Visit | Attending: Adult Health | Admitting: Adult Health

## 2021-04-11 ENCOUNTER — Ambulatory Visit (HOSPITAL_COMMUNITY): Admission: RE | Admit: 2021-04-11 | Payer: Managed Care, Other (non HMO) | Source: Ambulatory Visit

## 2021-04-11 ENCOUNTER — Encounter: Payer: Self-pay | Admitting: Oncology

## 2021-04-11 VITALS — BP 118/74 | HR 77 | Resp 17

## 2021-04-11 DIAGNOSIS — Z171 Estrogen receptor negative status [ER-]: Secondary | ICD-10-CM | POA: Insufficient documentation

## 2021-04-11 DIAGNOSIS — C50411 Malignant neoplasm of upper-outer quadrant of right female breast: Secondary | ICD-10-CM | POA: Diagnosis not present

## 2021-04-11 DIAGNOSIS — Z95828 Presence of other vascular implants and grafts: Secondary | ICD-10-CM

## 2021-04-11 DIAGNOSIS — R110 Nausea: Secondary | ICD-10-CM

## 2021-04-11 MED ORDER — SODIUM CHLORIDE 0.9 % IV SOLN: Freq: Once | INTRAVENOUS | Status: AC

## 2021-04-11 MED ORDER — ONDANSETRON HCL 4 MG/2ML IJ SOLN
8.0000 mg | Freq: Once | INTRAMUSCULAR | Status: AC
  Administered 2021-04-11: 10:00:00 8 mg via INTRAVENOUS
  Filled 2021-04-11: qty 4

## 2021-04-11 MED ORDER — HEPARIN SOD (PORK) LOCK FLUSH 100 UNIT/ML IV SOLN
500.0000 [IU] | Freq: Once | INTRAVENOUS | Status: AC
  Administered 2021-04-11: 11:00:00 500 [IU] via INTRAVENOUS

## 2021-04-11 MED ORDER — SODIUM CHLORIDE 0.9% FLUSH
10.0000 mL | Freq: Once | INTRAVENOUS | Status: AC
  Administered 2021-04-11: 11:00:00 10 mL via INTRAVENOUS

## 2021-04-11 MED ORDER — GADOBUTROL 1 MMOL/ML IV SOLN
6.5000 mL | Freq: Once | INTRAVENOUS | Status: AC | PRN
  Administered 2021-04-11: 21:00:00 6.5 mL via INTRAVENOUS

## 2021-04-11 MED ORDER — SODIUM CHLORIDE 0.9 % IV SOLN: 8.0000 mg | Freq: Once | INTRAVENOUS | Status: DC

## 2021-04-11 NOTE — Telephone Encounter (Addendum)
Connected with [name] in treatment room  Asked if she delivered leave of absence form to office.  With confirmation displayed form requesting needs, employer fax or means of return.   Robin Farley reports "November 3rd, 2022 as start date of continuous leave.  Expect to return after pending mastectomy surgery.  Will see surgeon 04/18/2021.  Will send portal message or call office when I receive fax number for return.  Currently on short-term disability approved for eight weeks".    Advised to request Long-term disability to begin at end of short-term.  Twelve work week hours of continuous FMLA may end 04/24/2021.  Expect needing up to eight weeks surgical recovery.   Completed original form may be picked up here during appointment registration or be mailed to keep for your records.   Currently denies further questions or needs.

## 2021-04-11 NOTE — Patient Instructions (Signed)

## 2021-04-12 ENCOUNTER — Other Ambulatory Visit: Payer: Self-pay

## 2021-04-12 ENCOUNTER — Inpatient Hospital Stay: Payer: Managed Care, Other (non HMO)

## 2021-04-12 VITALS — BP 126/77 | HR 74 | Temp 98.1°F | Resp 17

## 2021-04-12 DIAGNOSIS — Z95828 Presence of other vascular implants and grafts: Secondary | ICD-10-CM

## 2021-04-12 DIAGNOSIS — C50411 Malignant neoplasm of upper-outer quadrant of right female breast: Secondary | ICD-10-CM

## 2021-04-12 DIAGNOSIS — Z5112 Encounter for antineoplastic immunotherapy: Secondary | ICD-10-CM | POA: Diagnosis not present

## 2021-04-12 DIAGNOSIS — Z171 Estrogen receptor negative status [ER-]: Secondary | ICD-10-CM

## 2021-04-12 MED ORDER — SODIUM CHLORIDE 0.9% FLUSH
10.0000 mL | INTRAVENOUS | Status: DC | PRN
  Administered 2021-04-12: 10 mL

## 2021-04-12 MED ORDER — SODIUM CHLORIDE 0.9 % IV SOLN: Freq: Once | INTRAVENOUS | Status: AC

## 2021-04-12 MED ORDER — HEPARIN SOD (PORK) LOCK FLUSH 100 UNIT/ML IV SOLN
500.0000 [IU] | Freq: Once | INTRAVENOUS | Status: AC | PRN
  Administered 2021-04-12: 500 [IU]

## 2021-04-12 MED ORDER — SODIUM CHLORIDE 0.9 % IV SOLN: 8.0000 mg | Freq: Once | INTRAVENOUS | Status: DC

## 2021-04-12 MED ORDER — ONDANSETRON HCL 4 MG/2ML IJ SOLN
8.0000 mg | Freq: Once | INTRAMUSCULAR | Status: AC
  Administered 2021-04-12: 8 mg via INTRAVENOUS
  Filled 2021-04-12: qty 4

## 2021-04-15 ENCOUNTER — Encounter: Payer: Self-pay | Admitting: *Deleted

## 2021-04-16 ENCOUNTER — Telehealth: Payer: Self-pay | Admitting: *Deleted

## 2021-04-16 NOTE — Telephone Encounter (Signed)
Copied note from staff message per MD requesting call to pt per her after hours call on 04/15/2021 :  She would prefer not to come in - but would like to double the diflucan to 400 mg for a few days - she is coming to [facility] Friday and if needed will call to be seen. 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   Of note she had some of these same symptoms during pregnancy which was not resolved well but tolerated.   I did discuss the need to rinse frequently with salt water as well as use of coconut oil swish and spit and lemon water.   If you do know of something else - please recommend. She is wary of use of the lemon or anything that can cause the sores she has to be irritated.   She is on valcyclovir as well.   Overall drinking ok- food intake down.

## 2021-04-17 ENCOUNTER — Other Ambulatory Visit: Payer: Self-pay | Admitting: Hematology and Oncology

## 2021-04-17 ENCOUNTER — Other Ambulatory Visit: Payer: Self-pay | Admitting: Pharmacist

## 2021-04-17 NOTE — Progress Notes (Signed)
ON PATHWAY REGIMEN - Breast  No Change  Continue With Treatment as Ordered.  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

## 2021-04-18 ENCOUNTER — Ambulatory Visit: Payer: Self-pay | Admitting: Surgery

## 2021-04-18 DIAGNOSIS — C50411 Malignant neoplasm of upper-outer quadrant of right female breast: Secondary | ICD-10-CM

## 2021-04-18 NOTE — H&P (View-Only) (Signed)
Subjective   Chief Complaint: Breast Cancer   Robin Farley PCP - Dr. Oliver Barre  History of Present Illness: Robin Farley is a 56 y.o. female who is seen today for breast cancer follow-up.  This is a healthy 56 year old with a history of melanoma of the left leg status post wide excision and sentinel lymph node biopsy in 2014 by Dr. Jaclynn Guarneri.  The patient has had not had mammograms in the last 3 years.  In early 2022, she noticed some asymmetry and a possible mass in her right breast.  This was about 4 months prior to her presentation in 8/22.  She brought this to the attention of her gynecologist who obtained a mammogram followed by ultrasound and biopsy.  She had 2 separate masses noted in the right breast.  At 10:00 located 7 cm from the nipple there was a 1.9 x 1.1 x 1.7 cm mass.  At 9:00 there was a smaller mass that was subsequently biopsied and was benign.  Axillary ultrasound showed 2 enlarged lymph nodes.  1 of these was biopsied.  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  The axillary lymph node showed metastatic carcinoma.  MRI performed in August 2022 revealed no sign of left breast cancer.  However on the right, there was a wider area of non-mass enhancement.  Further biopsies were performed that revealed invasive ductal carcinoma.  Genetic testing was negative for any mutations.  She recently completed a course of neoadjuvant chemotherapy.  Plans are to continue immunotherapy after surgery.  She will likely also need chest radiation.  Recent MRI revealed a residual 6 cm area of non-mass enhancement.  The enlarged lymph node in the axilla has improved.  The tethering of the skin in the right upper outer quadrant has also resolved.  There is no definitive mass in the right upper outer quadrant.  The patient is accompanied today by her husband as well as her 2 daughters.  The patient is requesting a right mastectomy.  She  is considering a left prophylactic mastectomy.    clinical T1c N1, stage IIB invasive ductal carcinoma, grade 3, functionally triple negative, with an MIB-1 of 30%             (a) right breast retroareolar biopsy 12/09/2020 shows invasive ductal carcinoma, grade 2     Review of Systems: A complete review of systems was obtained from the patient.  I have reviewed this information and discussed as appropriate with the patient.  See HPI as well for other ROS.  Review of Systems  Constitutional: Positive for malaise/fatigue.  HENT: Positive for sore throat.   Eyes: Negative.   Respiratory: Negative.   Cardiovascular: Negative.   Gastrointestinal: Negative.   Genitourinary: Negative.   Musculoskeletal: Negative.   Skin: Negative.   Neurological: Positive for weakness.  Endo/Heme/Allergies: Negative.   Psychiatric/Behavioral: Negative.       Medical History: Past Medical History:  Diagnosis Date   History of cancer    Hyperlipidemia    Hypertension     Patient Active Problem List  Diagnosis   Allergic rhinitis   Anemia   Anxiety   Attention deficit disorder   Blood pressure elevated without history of HTN   Calcific bursitis of shoulder   Depression   Deviated nasal septum   Encounter for well adult exam with abnormal findings   Esophageal reflux   Headache   Hyperglycemia   Hyperlipidemia  Knee effusion, left   Malignant neoplasm of upper-outer quadrant of right breast in female, estrogen receptor positive (CMS-HCC)   Melanoma of thigh (CMS-HCC)   Nausea alone   Other malaise and fatigue   Right shoulder pain   Rosacea   Skin lesion   Urinary incontinence, urge   Vitamin D deficiency    Past surgical history Ultrasound-guided port placement 12/02/2020   No Known Allergies  Current Outpatient Medications on File Prior to Visit  Medication Sig Dispense Refill   atorvastatin (LIPITOR) 20 MG tablet Take 20 mg by mouth once daily     cholecalciferol  (VITAMIN D3) 2,000 unit tablet 1 tab by mouth once daily     dextroamphetamine-amphetamine (ADDERALL) 20 mg tablet Take 1 and 1/2 tablet by mouth twice per day     metFORMIN (GLUCOPHAGE-XR) 500 MG XR tablet Take 500 mg by mouth daily with breakfast     No current facility-administered medications on file prior to visit.    Family History  Problem Relation Age of Onset   High blood pressure (Hypertension) Mother    Hyperlipidemia (Elevated cholesterol) Mother    Diabetes Mother    Breast cancer Mother    Deep vein thrombosis (DVT or abnormal blood clot formation) Father    High blood pressure (Hypertension) Brother    Hyperlipidemia (Elevated cholesterol) Brother      Social History   Tobacco Use  Smoking Status Never  Smokeless Tobacco Never     Social History   Socioeconomic History   Marital status: Married  Tobacco Use   Smoking status: Never   Smokeless tobacco: Never  Vaping Use   Vaping Use: Never used  Substance and Sexual Activity   Alcohol use: Yes   Drug use: Never    Objective:    Physical Exam   Well-developed well-nourished female in no apparent distress.  She does appear tired and weak from recent chemotherapy Eyes:  Pupils equal, round; sclera anicteric; moist conjunctiva; no lid lag HENT:  Oral mucosa moist; good dentition  Neck:  No masses palpated, trachea midline; no thyromegaly Lungs:  CTA bilaterally; normal respiratory effort Her right chest port site appears clear with no sign of infection or seroma. The right breast is significantly improved.  No palpable masses.  No sign of skin tethering.  No nipple retraction or discharge.  No palpable lymphadenopathy.  The left breast shows no palpable masses, nipple discharge or retraction, no palpable lymphadenopathy. CV:  Regular rate and rhythm; no murmurs; extremities well-perfused with no edema Abd:  +bowel sounds, soft, non-tender, no palpable organomegaly; no palpable hernias Musc:  Unable to  assess gait; no apparent clubbing or cyanosis in extremities Lymphatic:  No palpable cervical or axillary lymphadenopathy Skin:  Warm, dry; no sign of jaundice Psychiatric - alert and oriented x 4; calm mood and affect    Labs, Imaging and Diagnostic Testing:  CLINICAL DATA:  Breast cancer. Assess treatment response. Patient initially presented in July of 2022 with a palpable mass in the RIGHT breast. Biopsies showed grade 3 invasive ductal carcinoma in the 10 o'clock location of the RIGHT breast (ribbon clip), benign concordant fibrocystic changes in the 9 o'clock location of the RIGHT breast, (coil clip) and metastatic carcinoma involving a lymph node in the RIGHT axilla (heart clip). An MR guided core biopsy performed on 12/09/2020 showed grade 2 invasive ductal carcinoma in the retroareolar region of the RIGHT breast and marked with a cylinder clip. MR guided core biopsy of retroareolar on  the same day also showed tubular adenoma, marked with a barbell clip. Patient has undergone neoadjuvant treatment.   EXAM: BILATERAL BREAST MRI WITH AND WITHOUT CONTRAST   TECHNIQUE: Multiplanar, multisequence MR images of both breasts were obtained prior to and following the intravenous administration of 6.5 ml of Gadavist   Three-dimensional MR images were rendered by post-processing of the original MR data on an independent workstation. The three-dimensional MR images were interpreted, and findings are reported in the following complete MRI report for this study. Three dimensional images were evaluated at the independent interpreting workstation using the DynaCAD thin client.   COMPARISON:  MRI on 12/09/2020, 11/26/2020, and mammogram 8292 earlier   FINDINGS: Breast composition: b. Scattered fibroglandular tissue.   Background parenchymal enhancement: Mild   Right breast: There has been improvement in skin thickening of the anterior portion of the RIGHT breast and tethering in  the LATERAL portion of the RIGHT breast. There is increased edema involving the retroareolar region and UPPER OUTER QUADRANT of the RIGHT breast. There is persistent distortion in the UPPER-OUTER QUADRANT of the RIGHT breast, at the site of biopsy proven grade 3 invasive ductal carcinoma. Two tissue marker clips are identified in the retroareolar region of the RIGHT breast. Within the retroareolar region and UPPER OUTER QUADRANT of the RIGHT breast, there are scattered foci of enhancement within the distribution of previous mass and non mass enhancement. These small enhancing nodules span approximately 6 centimeters in the UPPER-OUTER QUADRANT.   Tissue marker clip artifact in the LOWER OUTER QUADRANT RIGHT breast marking site of concordant fibrocystic changes.   A focus of enhancement in the UPPER INNER QUADRANT of the RIGHT breast measures 6 millimeters on image 64 of series 7, stable in appearance compared to prior study. Enhancement in this region is persistent kinetics no new findings in the RIGHT breast.   Left breast: No mass or abnormal enhancement.   Lymph nodes: Tissue marker clip identified within a morphologically normal lymph node in the LOWER RIGHT axilla on image 76 of series 7. Node is slightly smaller compared to prior study.   Ancillary findings:  None.   IMPRESSION: 1. Significant improvement in the appearance of the RIGHT breast. Improved skin thickening and skin tethering. 2. Persistent scattered foci of non mass enhancement throughout the retroareolar and UPPER OUTER QUADRANT of the RIGHT breast, spanning approximately 6 centimeters. 3. Stable appearance of 6 millimeter focus of enhancement at the middle depth of the UPPER INNER QUADRANT RIGHT breast, not previously biopsied. 4. Smaller biopsied RIGHT axillary lymph node, now with normal morphology. 5. No suspicious findings in the LEFT breast.   RECOMMENDATION: 1. Treatment plan for known RIGHT breast  malignancy. 2. If breast conservation is being planned, consider biopsy of the 6 millimeter focus in the [place] of the RIGHT breast prior to surgery.   BI-RADS CATEGORY  4: Suspicious.     Electronically Signed   By: Nolon Nations M.D.   On: 04/15/2021 09:49   Assessment and Plan:  Diagnoses and all orders for this visit:  Malignant neoplasm of upper-outer quadrant of right breast in female, estrogen receptor positive (CMS-HCC)     The patient has completed her course of neoadjuvant chemotherapy with clinical improvement.  Her breast MRI still shows a 6 cm area of suspicious non-mass enhancement.  Even prior to our discussion today, the patient is leaning towards a minimum of a right mastectomy.  She inquired about a left prophylactic mastectomy.  I explained to  her that this would be a reasonable option but that this does not offer her any survival benefit.  We discussed the procedure of a right mastectomy and targeted axillary lymph node dissection with sentinel lymph node biopsy.  I explained the use of radiotracer as well as mag trace to obtain adequate sample of lymph nodes in the axilla.  We discussed her expected hospital course.  She and her family had many questions which were answered.  She would like to proceed with a right mastectomy and targeted axillary lymph node dissection with sentinel lymph node biopsy.  She will defer any left mastectomy at this time.  We discussed the possibility of possible delayed reconstruction.  The surgical procedure has been discussed with the patient.  Potential risks, benefits, alternative treatments, and expected outcomes have been explained.  All of the patient's questions at this time have been answered.  The likelihood of reaching the patient's treatment goal is good.  The patient understand the proposed surgical procedure and wishes to proceed.   No follow-ups on file.  Carlean Jews, MD   04/18/2021 4:29 PM

## 2021-04-18 NOTE — H&P (Signed)
Subjective   Chief Complaint: Breast Cancer   Lanell Matar PCP - Dr. Oliver Barre  History of Present Illness: Robin Farley is a 56 y.o. female who is seen today for breast cancer follow-up.  This is a healthy 56 year old with a history of melanoma of the left leg status post wide excision and sentinel lymph node biopsy in 2014 by Dr. Jaclynn Guarneri.  The patient has had not had mammograms in the last 3 years.  In early 2022, she noticed some asymmetry and a possible mass in her right breast.  This was about 4 months prior to her presentation in 8/22.  She brought this to the attention of her gynecologist who obtained a mammogram followed by ultrasound and biopsy.  She had 2 separate masses noted in the right breast.  At 10:00 located 7 cm from the nipple there was a 1.9 x 1.1 x 1.7 cm mass.  At 9:00 there was a smaller mass that was subsequently biopsied and was benign.  Axillary ultrasound showed 2 enlarged lymph nodes.  1 of these was biopsied.  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  The axillary lymph node showed metastatic carcinoma.  MRI performed in August 2022 revealed no sign of left breast cancer.  However on the right, there was a wider area of non-mass enhancement.  Further biopsies were performed that revealed invasive ductal carcinoma.  Genetic testing was negative for any mutations.  She recently completed a course of neoadjuvant chemotherapy.  Plans are to continue immunotherapy after surgery.  She will likely also need chest radiation.  Recent MRI revealed a residual 6 cm area of non-mass enhancement.  The enlarged lymph node in the axilla has improved.  The tethering of the skin in the right upper outer quadrant has also resolved.  There is no definitive mass in the right upper outer quadrant.  The patient is accompanied today by her husband as well as her 2 daughters.  The patient is requesting a right mastectomy.  She  is considering a left prophylactic mastectomy.    clinical T1c N1, stage IIB invasive ductal carcinoma, grade 3, functionally triple negative, with an MIB-1 of 30%             (a) right breast retroareolar biopsy 12/09/2020 shows invasive ductal carcinoma, grade 2     Review of Systems: A complete review of systems was obtained from the patient.  I have reviewed this information and discussed as appropriate with the patient.  See HPI as well for other ROS.  Review of Systems  Constitutional: Positive for malaise/fatigue.  HENT: Positive for sore throat.   Eyes: Negative.   Respiratory: Negative.   Cardiovascular: Negative.   Gastrointestinal: Negative.   Genitourinary: Negative.   Musculoskeletal: Negative.   Skin: Negative.   Neurological: Positive for weakness.  Endo/Heme/Allergies: Negative.   Psychiatric/Behavioral: Negative.       Medical History: Past Medical History:  Diagnosis Date   History of cancer    Hyperlipidemia    Hypertension     Patient Active Problem List  Diagnosis   Allergic rhinitis   Anemia   Anxiety   Attention deficit disorder   Blood pressure elevated without history of HTN   Calcific bursitis of shoulder   Depression   Deviated nasal septum   Encounter for well adult exam with abnormal findings   Esophageal reflux   Headache   Hyperglycemia   Hyperlipidemia  Knee effusion, left   Malignant neoplasm of upper-outer quadrant of right breast in female, estrogen receptor positive (CMS-HCC)   Melanoma of thigh (CMS-HCC)   Nausea alone   Other malaise and fatigue   Right shoulder pain   Rosacea   Skin lesion   Urinary incontinence, urge   Vitamin D deficiency    Past surgical history Ultrasound-guided port placement 12/02/2020   No Known Allergies  Current Outpatient Medications on File Prior to Visit  Medication Sig Dispense Refill   atorvastatin (LIPITOR) 20 MG tablet Take 20 mg by mouth once daily     cholecalciferol  (VITAMIN D3) 2,000 unit tablet 1 tab by mouth once daily     dextroamphetamine-amphetamine (ADDERALL) 20 mg tablet Take 1 and 1/2 tablet by mouth twice per day     metFORMIN (GLUCOPHAGE-XR) 500 MG XR tablet Take 500 mg by mouth daily with breakfast     No current facility-administered medications on file prior to visit.    Family History  Problem Relation Age of Onset   High blood pressure (Hypertension) Mother    Hyperlipidemia (Elevated cholesterol) Mother    Diabetes Mother    Breast cancer Mother    Deep vein thrombosis (DVT or abnormal blood clot formation) Father    High blood pressure (Hypertension) Brother    Hyperlipidemia (Elevated cholesterol) Brother      Social History   Tobacco Use  Smoking Status Never  Smokeless Tobacco Never     Social History   Socioeconomic History   Marital status: Married  Tobacco Use   Smoking status: Never   Smokeless tobacco: Never  Vaping Use   Vaping Use: Never used  Substance and Sexual Activity   Alcohol use: Yes   Drug use: Never    Objective:    Physical Exam   Well-developed well-nourished female in no apparent distress.  She does appear tired and weak from recent chemotherapy Eyes:  Pupils equal, round; sclera anicteric; moist conjunctiva; no lid lag HENT:  Oral mucosa moist; good dentition  Neck:  No masses palpated, trachea midline; no thyromegaly Lungs:  CTA bilaterally; normal respiratory effort Her right chest port site appears clear with no sign of infection or seroma. The right breast is significantly improved.  No palpable masses.  No sign of skin tethering.  No nipple retraction or discharge.  No palpable lymphadenopathy.  The left breast shows no palpable masses, nipple discharge or retraction, no palpable lymphadenopathy. CV:  Regular rate and rhythm; no murmurs; extremities well-perfused with no edema Abd:  +bowel sounds, soft, non-tender, no palpable organomegaly; no palpable hernias Musc:  Unable to  assess gait; no apparent clubbing or cyanosis in extremities Lymphatic:  No palpable cervical or axillary lymphadenopathy Skin:  Warm, dry; no sign of jaundice Psychiatric - alert and oriented x 4; calm mood and affect    Labs, Imaging and Diagnostic Testing:  CLINICAL DATA:  Breast cancer. Assess treatment response. Patient initially presented in July of 2022 with a palpable mass in the RIGHT breast. Biopsies showed grade 3 invasive ductal carcinoma in the 10 o'clock location of the RIGHT breast (ribbon clip), benign concordant fibrocystic changes in the 9 o'clock location of the RIGHT breast, (coil clip) and metastatic carcinoma involving a lymph node in the RIGHT axilla (heart clip). An MR guided core biopsy performed on 12/09/2020 showed grade 2 invasive ductal carcinoma in the retroareolar region of the RIGHT breast and marked with a cylinder clip. MR guided core biopsy of retroareolar on  the same day also showed tubular adenoma, marked with a barbell clip. Patient has undergone neoadjuvant treatment.   EXAM: BILATERAL BREAST MRI WITH AND WITHOUT CONTRAST   TECHNIQUE: Multiplanar, multisequence MR images of both breasts were obtained prior to and following the intravenous administration of 6.5 ml of Gadavist   Three-dimensional MR images were rendered by post-processing of the original MR data on an independent workstation. The three-dimensional MR images were interpreted, and findings are reported in the following complete MRI report for this study. Three dimensional images were evaluated at the independent interpreting workstation using the DynaCAD thin client.   COMPARISON:  MRI on 12/09/2020, 11/26/2020, and mammogram 8292 earlier   FINDINGS: Breast composition: b. Scattered fibroglandular tissue.   Background parenchymal enhancement: Mild   Right breast: There has been improvement in skin thickening of the anterior portion of the RIGHT breast and tethering in  the LATERAL portion of the RIGHT breast. There is increased edema involving the retroareolar region and UPPER OUTER QUADRANT of the RIGHT breast. There is persistent distortion in the UPPER-OUTER QUADRANT of the RIGHT breast, at the site of biopsy proven grade 3 invasive ductal carcinoma. Two tissue marker clips are identified in the retroareolar region of the RIGHT breast. Within the retroareolar region and UPPER OUTER QUADRANT of the RIGHT breast, there are scattered foci of enhancement within the distribution of previous mass and non mass enhancement. These small enhancing nodules span approximately 6 centimeters in the UPPER-OUTER QUADRANT.   Tissue marker clip artifact in the LOWER OUTER QUADRANT RIGHT breast marking site of concordant fibrocystic changes.   A focus of enhancement in the UPPER INNER QUADRANT of the RIGHT breast measures 6 millimeters on image 64 of series 7, stable in appearance compared to prior study. Enhancement in this region is persistent kinetics no new findings in the RIGHT breast.   Left breast: No mass or abnormal enhancement.   Lymph nodes: Tissue marker clip identified within a morphologically normal lymph node in the LOWER RIGHT axilla on image 76 of series 7. Node is slightly smaller compared to prior study.   Ancillary findings:  None.   IMPRESSION: 1. Significant improvement in the appearance of the RIGHT breast. Improved skin thickening and skin tethering. 2. Persistent scattered foci of non mass enhancement throughout the retroareolar and UPPER OUTER QUADRANT of the RIGHT breast, spanning approximately 6 centimeters. 3. Stable appearance of 6 millimeter focus of enhancement at the middle depth of the UPPER INNER QUADRANT RIGHT breast, not previously biopsied. 4. Smaller biopsied RIGHT axillary lymph node, now with normal morphology. 5. No suspicious findings in the LEFT breast.   RECOMMENDATION: 1. Treatment plan for known RIGHT breast  malignancy. 2. If breast conservation is being planned, consider biopsy of the 6 millimeter focus in the [place] of the RIGHT breast prior to surgery.   BI-RADS CATEGORY  4: Suspicious.     Electronically Signed   By: Nolon Nations M.D.   On: 04/15/2021 09:49   Assessment and Plan:  Diagnoses and all orders for this visit:  Malignant neoplasm of upper-outer quadrant of right breast in female, estrogen receptor positive (CMS-HCC)     The patient has completed her course of neoadjuvant chemotherapy with clinical improvement.  Her breast MRI still shows a 6 cm area of suspicious non-mass enhancement.  Even prior to our discussion today, the patient is leaning towards a minimum of a right mastectomy.  She inquired about a left prophylactic mastectomy.  I explained to  her that this would be a reasonable option but that this does not offer her any survival benefit.  We discussed the procedure of a right mastectomy and targeted axillary lymph node dissection with sentinel lymph node biopsy.  I explained the use of radiotracer as well as mag trace to obtain adequate sample of lymph nodes in the axilla.  We discussed her expected hospital course.  She and her family had many questions which were answered.  She would like to proceed with a right mastectomy and targeted axillary lymph node dissection with sentinel lymph node biopsy.  She will defer any left mastectomy at this time.  We discussed the possibility of possible delayed reconstruction.  The surgical procedure has been discussed with the patient.  Potential risks, benefits, alternative treatments, and expected outcomes have been explained.  All of the patient's questions at this time have been answered.  The likelihood of reaching the patient's treatment goal is good.  The patient understand the proposed surgical procedure and wishes to proceed.   No follow-ups on file.  Robin Jews, MD   04/18/2021 4:29 PM

## 2021-04-21 ENCOUNTER — Other Ambulatory Visit: Payer: Self-pay

## 2021-04-21 DIAGNOSIS — Z171 Estrogen receptor negative status [ER-]: Secondary | ICD-10-CM

## 2021-04-22 ENCOUNTER — Inpatient Hospital Stay: Payer: Managed Care, Other (non HMO)

## 2021-04-22 ENCOUNTER — Other Ambulatory Visit: Payer: Self-pay | Admitting: Surgery

## 2021-04-22 ENCOUNTER — Other Ambulatory Visit (HOSPITAL_COMMUNITY): Payer: Self-pay

## 2021-04-22 ENCOUNTER — Encounter: Payer: Self-pay | Admitting: Oncology

## 2021-04-22 ENCOUNTER — Encounter: Payer: Self-pay | Admitting: Hematology and Oncology

## 2021-04-22 ENCOUNTER — Encounter: Payer: Self-pay | Admitting: *Deleted

## 2021-04-22 ENCOUNTER — Other Ambulatory Visit: Payer: Self-pay

## 2021-04-22 ENCOUNTER — Inpatient Hospital Stay: Payer: Managed Care, Other (non HMO) | Attending: Oncology | Admitting: Adult Health

## 2021-04-22 VITALS — BP 134/81 | HR 99 | Temp 98.1°F | Resp 18 | Ht 66.0 in | Wt 137.4 lb

## 2021-04-22 DIAGNOSIS — Z171 Estrogen receptor negative status [ER-]: Secondary | ICD-10-CM

## 2021-04-22 DIAGNOSIS — Z95828 Presence of other vascular implants and grafts: Secondary | ICD-10-CM

## 2021-04-22 DIAGNOSIS — Z79899 Other long term (current) drug therapy: Secondary | ICD-10-CM | POA: Diagnosis not present

## 2021-04-22 DIAGNOSIS — C50411 Malignant neoplasm of upper-outer quadrant of right female breast: Secondary | ICD-10-CM | POA: Insufficient documentation

## 2021-04-22 DIAGNOSIS — Z5112 Encounter for antineoplastic immunotherapy: Secondary | ICD-10-CM | POA: Diagnosis present

## 2021-04-22 LAB — CMP (CANCER CENTER ONLY)
ALT: 6 U/L (ref 0–44)
AST: 9 U/L — ABNORMAL LOW (ref 15–41)
Albumin: 3.9 g/dL (ref 3.5–5.0)
Alkaline Phosphatase: 85 U/L (ref 38–126)
Anion gap: 13 (ref 5–15)
BUN: 5 mg/dL — ABNORMAL LOW (ref 6–20)
CO2: 23 mmol/L (ref 22–32)
Calcium: 9.2 mg/dL (ref 8.9–10.3)
Chloride: 104 mmol/L (ref 98–111)
Creatinine: 0.69 mg/dL (ref 0.44–1.00)
GFR, Estimated: >60 mL/min (ref >60)
Glucose, Bld: 79 mg/dL (ref 70–99)
Potassium: 3.5 mmol/L (ref 3.5–5.1)
Sodium: 140 mmol/L (ref 135–145)
Total Bilirubin: 0.3 mg/dL (ref 0.3–1.2)
Total Protein: 6.7 g/dL (ref 6.5–8.1)

## 2021-04-22 LAB — CBC WITH DIFFERENTIAL (CANCER CENTER ONLY)
Abs Immature Granulocytes: 0.1 10*3/uL — ABNORMAL HIGH (ref 0.00–0.07)
Basophils Absolute: 0.1 10*3/uL (ref 0.0–0.1)
Basophils Relative: 1 %
Eosinophils Absolute: 0 10*3/uL (ref 0.0–0.5)
Eosinophils Relative: 1 %
HCT: 26.8 % — ABNORMAL LOW (ref 36.0–46.0)
Hemoglobin: 9.4 g/dL — ABNORMAL LOW (ref 12.0–15.0)
Immature Granulocytes: 2 %
Lymphocytes Relative: 9 %
Lymphs Abs: 0.6 10*3/uL — ABNORMAL LOW (ref 0.7–4.0)
MCH: 34.3 pg — ABNORMAL HIGH (ref 26.0–34.0)
MCHC: 35.1 g/dL (ref 30.0–36.0)
MCV: 97.8 fL (ref 80.0–100.0)
Monocytes Absolute: 0.5 10*3/uL (ref 0.1–1.0)
Monocytes Relative: 8 %
Neutro Abs: 5.3 10*3/uL (ref 1.7–7.7)
Neutrophils Relative %: 79 %
Platelet Count: 141 10*3/uL — ABNORMAL LOW (ref 150–400)
RBC: 2.74 MIL/uL — ABNORMAL LOW (ref 3.87–5.11)
RDW: 14.3 % (ref 11.5–15.5)
WBC Count: 6.5 10*3/uL (ref 4.0–10.5)
nRBC: 0 % (ref 0.0–0.2)

## 2021-04-22 LAB — TSH: TSH: 1.548 u[IU]/mL (ref 0.308–3.960)

## 2021-04-22 MED ORDER — NYSTATIN 100000 UNIT/ML MT SUSP
OROMUCOSAL | 3 refills | Status: DC
  Filled 2021-04-22: qty 240, 12d supply, fill #0

## 2021-04-22 MED ORDER — SODIUM CHLORIDE 0.9% FLUSH
10.0000 mL | Freq: Once | INTRAVENOUS | Status: AC | PRN
  Administered 2021-04-22: 10 mL

## 2021-04-22 NOTE — Progress Notes (Signed)
[facility] Follow up:    Robin Borg, [place] [address]   DIAGNOSIS:  Cancer Staging  Malignant neoplasm of upper-outer quadrant of right breast in female, estrogen receptor negative ([place]) Staging form: Breast, AJCC 8th Edition - Clinical: Stage IIB (cT1c, cN1, cM0, G3, ER-, PR-, HER2-) - Signed by [name], MD on 11/20/2020 Histologic grading system: 3 grade system   SUMMARY OF ONCOLOGIC HISTORY: Oncology History  Malignant neoplasm of upper-outer quadrant of right breast in female, estrogen receptor negative ([place])  11/12/2020 Initial Diagnosis   Malignant neoplasm of upper-outer quadrant of right breast in female, estrogen receptor positive ([place])   11/20/2020 Cancer Staging   Staging form: Breast, AJCC 8th Edition - Clinical: Stage IIB (cT1c, cN1, cM0, G3, ER-, PR-, HER2-) - Signed by [name], MD on 11/20/2020 Histologic grading system: 3 grade system    12/03/2020 - 04/10/2021 Chemotherapy   Patient is on Treatment Plan : BREAST Pembrolizumab + Carboplatin D1 + Paclitaxel D1,8,15 q21d X 4 cycles / Pembrolizumab + AC q21d x 4 cycles     12/03/2020 Genetic Testing   Negative hereditary cancer genetic testing: no pathogenic variants detected in [facility] CancerNext-Expanded +RNAinsight Panel.  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  The report date is December 03, 2020.   The CancerNext-Expanded gene panel offered by [facility] and includes sequencing, rearrangement, and RNA analysis for the following 77 genes: AIP, ALK, APC, ATM, AXIN2, BAP1, BARD1, BLM, BMPR1A, BRCA1, BRCA2, BRIP1, CDC73, CDH1, CDK4, CDKN1B, CDKN2A, CHEK2, CTNNA1, DICER1, FANCC, FH, FLCN, GALNT12, KIF1B, LZTR1, MAX, MEN1, MET, MLH1, MSH2, MSH3, MSH6, MUTYH, NBN, NF1, NF2, NTHL1, PALB2, PHOX2B, PMS2, POT1, PRKAR1A, PTCH1, PTEN, RAD51C, RAD51D, RB1, RECQL, RET, SDHA, SDHAF2, SDHB, SDHC, SDHD, SMAD4, SMARCA4, SMARCB1,  SMARCE1, STK11, SUFU, TMEM127, TP53, TSC1, TSC2, VHL and XRCC2 (sequencing and deletion/duplication); EGFR, EGLN1, HOXB13, KIT, MITF, PDGFRA, POLD1, and POLE (sequencing only); EPCAM and GREM1 (deletion/duplication only).    04/29/2021 -  Chemotherapy   Patient is on Treatment Plan : BREAST Pembrolizumab (200) q21d x 27 weeks       CURRENT THERAPY: Keytruda  INTERVAL HISTORY: Robin Farley 56 y.o. female returns for evaluation after completing her neoadjuvant chemotherapy.  She has struggled with a decreased appetite.  She has lost 10 pounds since her final treatment administration date.  She assures me that even though she has had decreased oral intake of food that her fluid intake has remained normal.   She is planning to undergo right breast mastectomy.  Her post treatment MRI showed continued non-mass enhancement of the right upper outer quadrant of 6 cm.  A significant improvement of the appearance of the right breast.  Her right axillary lymphadenopathy has resolved.  She met with Dr. Georgette Dover who is working on getting her scheduled for surgery.  She has not talked to a Psychiatric nurse about the possibility of reconstruction.   Patient Active Problem List   Diagnosis Date Noted   Elevated LFTs 12/10/2020   Genetic testing 12/06/2020   Port-A-Cath in place 12/03/2020   Family history of breast cancer 11/20/2020   Personal history of malignant melanoma 11/20/2020   Family history of melanoma 11/20/2020   Malignant neoplasm of upper-outer quadrant of right breast in female, estrogen receptor negative ([place]) 11/12/2020   Vitamin D deficiency 06/02/2019   Hyperglycemia 06/02/2019   Blood pressure elevated without history of HTN 06/02/2019   Headache 12/16/2018  Urinary incontinence, urge 12/14/2017   Calcific bursitis of shoulder 07/10/2015   Skin lesion 06/24/2015   Nausea alone 04/27/2013   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  MEDICAL HISTORY: Past Medical History:  Diagnosis Date   Abdominal pain, generalized 12/27/2008   Acute sinusitis, unspecified 02/26/2007   ADD 03/13/2010   ALLERGIC RHINITIS 02/23/2007   ANEMIA 02/05/2007   Anxiety 11/17/2011   ATTENTION DEFICIT DISORDER, HX OF 02/05/2007   Breast cancer ([place])    Deviated nasal septum 02/05/2007   Diabetes mellitus without complication ([place])    Family history of breast cancer 11/20/2020   Family history of melanoma 11/20/2020   FATIGUE 02/23/2007   HYPERLIPIDEMIA 02/23/2007   Personal history of malignant melanoma 11/20/2020   SWELLING MASS OR LUMP IN HEAD AND NECK 07/27/2008   UTI 02/23/2007    SURGICAL HISTORY: Past Surgical History:  Procedure Laterality Date   BROW LIFT     CESAREAN [place]   Nasal   MELANOMA EXCISION WITH SENTINEL LYMPH NODE BIOPSY Left 10/24/2012   Procedure: MELANOMA wide EXCISION left lateral thigh WITH SENTINEL LYMPH NODE BIOPSY left groin;  Surgeon: Edward Jolly, MD;  Location: [facility];  Service: General;  Laterality: Left;   NASAL SEPTUM SURGERY     PORTACATH PLACEMENT Right 12/02/2020   Procedure: INSERTION PORT-A-CATH;  Surgeon: Donnie Mesa, MD;  Location: [facility];  Service: General;  Laterality: Right;    SOCIAL HISTORY: Social History   Socioeconomic History   Marital status: Married    Spouse name: Not on file   Number of children: 2   Years of education: Not on file   Highest education level: Not on file  Occupational History   Occupation:  master's in fine arts [employer]    Employer: [employer]  Tobacco Use   Smoking status: Never   Smokeless tobacco: Never  Substance and Sexual Activity   Alcohol use: Yes    Comment: Very rare occasions   Drug use: Never   Sexual activity: Yes    Birth control/protection: None, Post-menopausal  Other Topics Concern   Not on file  Social History Narrative   Not on file   Social Determinants of Health   Financial Resource Strain: Low Risk    Difficulty of Paying Living Expenses: Not hard at all  Food Insecurity: No Food Insecurity   Worried About Charity fundraiser in the Last Year: Never true   Arboriculturist in the Last Year: Never true  Transportation Needs: No Transportation Needs   Lack of Transportation (Medical): No   Lack of Transportation (Non-Medical): No  Physical Activity: Not on file  Stress: Not on file  Social Connections: Not on file  Intimate Partner Violence: Not on file    FAMILY HISTORY: Family History  Problem Relation Age of Onset   Hypertension Mother    Breast cancer Mother 23   COPD Father    Atrial fibrillation Father    Atrial fibrillation  Sister    Kidney cancer Maternal Aunt        dx 93s   Leukemia Paternal Aunt        dx after 66   Cancer Paternal Aunt        unknown type; dx after 21   Brain cancer Paternal Grandfather 73       brain tumor per pt   Melanoma Cousin        arm; no mets per pt   Alcohol abuse Other    Depression Other    Glaucoma Other    Breast cancer Other        MGM's sisters, x3, dx after 59   Colon polyps Neg Hx     Review of Systems  Constitutional:  Positive for appetite change and unexpected weight change. Negative for chills, fatigue and fever.  HENT:   Negative for hearing loss, lump/mass and trouble swallowing.   Eyes:  Negative for eye problems and icterus.  Respiratory:  Negative for chest tightness, cough and shortness of breath.   Cardiovascular:  Negative for chest pain, leg swelling  and palpitations.  Gastrointestinal:  Negative for abdominal distention, abdominal pain, constipation, diarrhea, nausea and vomiting.  Endocrine: Negative for hot flashes.  Genitourinary:  Negative for difficulty urinating.   Musculoskeletal:  Negative for arthralgias.  Skin:  Negative for itching and rash.  Neurological:  Negative for dizziness, extremity weakness, headaches and numbness.  Hematological:  Negative for adenopathy. Does not bruise/bleed easily.  Psychiatric/Behavioral:  Negative for depression. The patient is not nervous/anxious.      PHYSICAL EXAMINATION  ECOG PERFORMANCE STATUS: 2 - Symptomatic, <50% confined to bed  Vitals:   04/22/21 1109  BP: 134/81  Pulse: 99  Resp: 18  Temp: 98.1 F (36.7 C)  SpO2: 100%    Physical Exam Constitutional:      General: She is not in acute distress.    Appearance: Normal appearance. She is not toxic-appearing.  HENT:     Head: Normocephalic and atraumatic.  Eyes:     General: No scleral icterus. Cardiovascular:     Rate and Rhythm: Normal rate and regular rhythm.     Pulses: Normal pulses.     Heart sounds: Normal heart sounds.  Pulmonary:     Effort: Pulmonary effort is normal.     Breath sounds: Normal breath sounds.  Abdominal:     General: Abdomen is flat. Bowel sounds are normal. There is no distension.     Palpations: Abdomen is soft.     Tenderness: There is no abdominal tenderness.  Musculoskeletal:        General: No swelling.     Cervical back: Neck supple.  Lymphadenopathy:     Cervical: No cervical adenopathy.  Skin:    General: Skin is warm and dry.     Findings: No rash.  Neurological:     General: No focal deficit present.     Mental Status: She is alert.  Psychiatric:        Mood and Affect: Mood normal.        Behavior: Behavior normal.    LABORATORY DATA:  CBC    Component Value Date/Time   WBC 6.5 04/22/2021 1035   WBC 4.3 12/20/2019 1521   RBC 2.74 (L) 04/22/2021 1035   HGB 9.4  (L) 04/22/2021 1035   HCT 26.8 (L) 04/22/2021 1035   PLT 141 (L) 04/22/2021 1035   MCV 97.8 04/22/2021 1035   MCH 34.3 (H) 04/22/2021  1035   MCHC 35.1 04/22/2021 1035   RDW 14.3 04/22/2021 1035   LYMPHSABS 0.6 (L) 04/22/2021 1035   MONOABS 0.5 04/22/2021 1035   EOSABS 0.0 04/22/2021 1035   BASOSABS 0.1 04/22/2021 1035    CMP     Component Value Date/Time   NA 140 04/22/2021 1035   K 3.5 04/22/2021 1035   CL 104 04/22/2021 1035   CO2 23 04/22/2021 1035   GLUCOSE 79 04/22/2021 1035   BUN 5 (L) 04/22/2021 1035   CREATININE 0.69 04/22/2021 1035   CALCIUM 9.2 04/22/2021 1035   PROT 6.7 04/22/2021 1035   ALBUMIN 3.9 04/22/2021 1035   AST 9 (L) 04/22/2021 1035   ALT 6 04/22/2021 1035   ALKPHOS 85 04/22/2021 1035   BILITOT 0.3 04/22/2021 1035   GFRNONAA >60 04/22/2021 1035   GFRAA 100 03/23/2008 1212       ASSESSMENT and THERAPY PLAN:   Malignant neoplasm of upper-outer quadrant of right breast in female, estrogen receptor negative (HCC) Robin Farley is a 56 year old woman with 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  #1 stage IIb triple negative breast cancer: She will continue on pembrolizumab treatment.  She is due for this again next week.  She is following up with Dr. Georgette Dover to get on the surgery schedule for right mastectomy.  I sent a referral in for her to meet with plastic surgery to discuss reconstruction options.  Many times patients will have a conversation with plastic surgery and decide that that is not something they want to pursue.  2.  Decreased appetite and weight loss: I placed a referral to nutrition.  We will follow this closely.  If it does not improve we will need to get restaging.  3.  Deconditioning: I reviewed with Robin Farley that deconditioning can have been following neoadjuvant chemotherapy.  I recommended that she increase her physical activity slowly over the next weeks leading up to her surgery so that  she can gain some strength to be able to heal.  Robin Farley will return on January 17 for labs and Keytruda.  She has a follow-up appointment with Dr. Chryl Heck in February.   All questions were answered. The patient knows to call the clinic with any problems, questions or concerns. We can certainly see the patient much sooner if necessary.  Total encounter time: 30 minutes in face-to-face visit time, chart review, lab review, care coordination, order entry, and documentation of the encounter.  Wilber Bihari, NP 04/24/21 8:49 AM Medical Oncology and Hematology [facility] [place], [place] [address] Tel. [phone]    Fax. [phone]  *Total Encounter Time as defined by the Centers for Medicare and Medicaid Services includes, in addition to the face-to-face time of a patient visit (documented in the note above) non-face-to-face time: obtaining and reviewing outside history, ordering and reviewing medications, tests or procedures, care coordination (communications with other health care professionals or caregivers) and documentation in the medical record.

## 2021-04-23 ENCOUNTER — Encounter: Payer: Self-pay | Admitting: *Deleted

## 2021-04-23 DIAGNOSIS — Z171 Estrogen receptor negative status [ER-]: Secondary | ICD-10-CM

## 2021-04-24 ENCOUNTER — Encounter: Payer: Self-pay | Admitting: Oncology

## 2021-04-24 ENCOUNTER — Encounter: Payer: Self-pay | Admitting: Adult Health

## 2021-04-24 ENCOUNTER — Encounter: Payer: Self-pay | Admitting: Hematology and Oncology

## 2021-04-24 NOTE — Assessment & Plan Note (Signed)
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  #1 stage IIb triple negative breast cancer: She will continue on pembrolizumab treatment.  She is due for this again next week.  She is following up with Dr. Georgette Dover to get on the surgery schedule for right mastectomy.  I sent a referral in for her to meet with plastic surgery to discuss reconstruction options.  Many times patients will have a conversation with plastic surgery and decide that that is not something they want to pursue.  2.  Decreased appetite and weight loss: I placed a referral to nutrition.  We will follow this closely.  If it does not improve we will need to get restaging.  3.  Deconditioning: I reviewed with [name] that deconditioning can have been following neoadjuvant chemotherapy.  I recommended that she increase her physical activity slowly over the next weeks leading up to her surgery so that she can gain some strength to be able to heal.  [name] will return on January 17 for labs and Keytruda.  She has a follow-up appointment with Dr. Chryl Heck in February.

## 2021-04-28 ENCOUNTER — Telehealth: Payer: Self-pay

## 2021-04-28 NOTE — Telephone Encounter (Signed)
Notified Patient of completion of FMLA paperwork. Copy placed at Registration desk for pick-up as requested by Patient.

## 2021-04-29 ENCOUNTER — Inpatient Hospital Stay: Payer: Managed Care, Other (non HMO) | Admitting: Nutrition

## 2021-04-29 ENCOUNTER — Other Ambulatory Visit: Payer: Self-pay

## 2021-04-29 ENCOUNTER — Inpatient Hospital Stay: Payer: Managed Care, Other (non HMO)

## 2021-04-29 VITALS — BP 132/86 | HR 87 | Temp 98.6°F | Resp 16 | Wt 138.0 lb

## 2021-04-29 DIAGNOSIS — C50411 Malignant neoplasm of upper-outer quadrant of right female breast: Secondary | ICD-10-CM

## 2021-04-29 DIAGNOSIS — Z5112 Encounter for antineoplastic immunotherapy: Secondary | ICD-10-CM | POA: Diagnosis not present

## 2021-04-29 DIAGNOSIS — Z171 Estrogen receptor negative status [ER-]: Secondary | ICD-10-CM

## 2021-04-29 DIAGNOSIS — Z95828 Presence of other vascular implants and grafts: Secondary | ICD-10-CM

## 2021-04-29 LAB — CBC WITH DIFFERENTIAL (CANCER CENTER ONLY)
Abs Immature Granulocytes: 0.04 10*3/uL (ref 0.00–0.07)
Basophils Absolute: 0 10*3/uL (ref 0.0–0.1)
Basophils Relative: 1 %
Eosinophils Absolute: 0 10*3/uL (ref 0.0–0.5)
Eosinophils Relative: 1 %
HCT: 26.2 % — ABNORMAL LOW (ref 36.0–46.0)
Hemoglobin: 9 g/dL — ABNORMAL LOW (ref 12.0–15.0)
Immature Granulocytes: 1 %
Lymphocytes Relative: 8 %
Lymphs Abs: 0.5 10*3/uL — ABNORMAL LOW (ref 0.7–4.0)
MCH: 34.7 pg — ABNORMAL HIGH (ref 26.0–34.0)
MCHC: 34.4 g/dL (ref 30.0–36.0)
MCV: 101.2 fL — ABNORMAL HIGH (ref 80.0–100.0)
Monocytes Absolute: 0.9 10*3/uL (ref 0.1–1.0)
Monocytes Relative: 13 %
Neutro Abs: 4.9 10*3/uL (ref 1.7–7.7)
Neutrophils Relative %: 76 %
Platelet Count: 352 10*3/uL (ref 150–400)
RBC: 2.59 MIL/uL — ABNORMAL LOW (ref 3.87–5.11)
RDW: 14.7 % (ref 11.5–15.5)
WBC Count: 6.4 10*3/uL (ref 4.0–10.5)
nRBC: 0 % (ref 0.0–0.2)

## 2021-04-29 LAB — CMP (CANCER CENTER ONLY)
ALT: 9 U/L (ref 0–44)
AST: 13 U/L — ABNORMAL LOW (ref 15–41)
Albumin: 3.7 g/dL (ref 3.5–5.0)
Alkaline Phosphatase: 79 U/L (ref 38–126)
Anion gap: 13 (ref 5–15)
BUN: <5 mg/dL — ABNORMAL LOW (ref 6–20)
CO2: 25 mmol/L (ref 22–32)
Calcium: 9.2 mg/dL (ref 8.9–10.3)
Chloride: 106 mmol/L (ref 98–111)
Creatinine: 0.77 mg/dL (ref 0.44–1.00)
GFR, Estimated: >60 mL/min (ref >60)
Glucose, Bld: 100 mg/dL — ABNORMAL HIGH (ref 70–99)
Potassium: 3.1 mmol/L — ABNORMAL LOW (ref 3.5–5.1)
Sodium: 144 mmol/L (ref 135–145)
Total Bilirubin: 0.3 mg/dL (ref 0.3–1.2)
Total Protein: 6.4 g/dL — ABNORMAL LOW (ref 6.5–8.1)

## 2021-04-29 LAB — TSH: TSH: 3.095 u[IU]/mL (ref 0.308–3.960)

## 2021-04-29 MED ORDER — HEPARIN SOD (PORK) LOCK FLUSH 100 UNIT/ML IV SOLN
500.0000 [IU] | Freq: Once | INTRAVENOUS | Status: AC | PRN
  Administered 2021-04-29: 500 [IU]

## 2021-04-29 MED ORDER — SODIUM CHLORIDE 0.9% FLUSH
10.0000 mL | INTRAVENOUS | Status: DC | PRN
  Administered 2021-04-29: 10 mL

## 2021-04-29 MED ORDER — SODIUM CHLORIDE 0.9 % IV SOLN
200.0000 mg | Freq: Once | INTRAVENOUS | Status: AC
  Administered 2021-04-29: 200 mg via INTRAVENOUS
  Filled 2021-04-29: qty 200

## 2021-04-29 MED ORDER — SODIUM CHLORIDE 0.9% FLUSH
10.0000 mL | INTRAVENOUS | Status: AC | PRN
  Administered 2021-04-29: 10 mL

## 2021-04-29 MED ORDER — SODIUM CHLORIDE 0.9 % IV SOLN: Freq: Once | INTRAVENOUS | Status: AC

## 2021-04-29 NOTE — Progress Notes (Signed)
56 year old female diagnosed with ER negative breast cancer in August 2022.  She is currently receiving Keytruda every 21 days for 27 weeks.  She is followed by Dr. Chryl Heck.  Past medical history includes ADD, diabetes, fatigue, hyperlipidemia, melanoma.  Medications include Magic mouthwash, metformin, Remeron, Zofran, and Compazine.  Labs were reviewed.  Height: 66 inches. Weight: 138 pounds on January 17. Usual body weight: 161 pounds in November 2022. BMI: 22.27. ECOG: 2.  Patient reports decreased appetite.  She is pleased that she has been able to maintain her weight over the last week.  Reports drinking at least 3-4 bottles of water daily (48-64 ounces.)  She is trying to increase her physical activity.  Patient reports she has thrush which is causing some taste changes.  She states food sticks to her teeth so she finds it difficult to eat some things.  She does report a little constipation and has started [employer].  Patient reports eating a half a banana or 1 egg at breakfast.  She has a sandwich or a bowl of lentils for lunch.  She tends to eat just vegetables and a protein at dinner.  Nutrition diagnosis: Unintended weight loss related to cancer and associated treatments as evidenced by 14% weight loss in less than 3 months which is significant.  Intervention: Educated on importance of smaller more frequent meals and snacks with adequate calorie and protein intake to achieve weight maintenance. Encouraged increased fluid intake to a minimum of 64 ounces daily. Encouraged healthy diet with good variety and plenty of protein for adequate healing after pending surgery.   Continue bowel regimen.   Provided nutrition facts sheets and contact information.  Questions were answered.  Teach back method used.  Monitoring, evaluation, goals: Patient will tolerate adequate calories and protein for adequate healing after surgery and weight maintenance.  Next visit: Patient will contact RD  with questions or concerns.  **Disclaimer: This note was dictated with voice recognition software. Similar sounding words can inadvertently be transcribed and this note may contain transcription errors which may not have been corrected upon publication of note.**

## 2021-04-29 NOTE — Patient Instructions (Signed)
[facility] MEDICAL ONCOLOGY   ?Discharge Instructions: ?Thank you for choosing [facility] to provide your oncology and hematology care.  ? ?If you have a lab appointment with the Cancer Center, please go directly to the Cancer Center and check in at the registration area. ?  ?Wear comfortable clothing and clothing appropriate for easy access to any Portacath or PICC line.  ? ?We strive to give you quality time with your provider. You may need to reschedule your appointment if you arrive late (15 or more minutes).  Arriving late affects you and other patients whose appointments are after yours.  Also, if you miss three or more appointments without notifying the office, you may be dismissed from the clinic at the provider?s discretion.    ?  ?For prescription refill requests, have your pharmacy contact our office and allow 72 hours for refills to be completed.   ? ?Today you received the following chemotherapy and/or immunotherapy agents: pembrolizumab    ?  ?To help prevent nausea and vomiting after your treatment, we encourage you to take your nausea medication as directed. ? ?BELOW ARE SYMPTOMS THAT SHOULD BE REPORTED IMMEDIATELY: ?*FEVER GREATER THAN 100.4 F (38 ?C) OR HIGHER ?*CHILLS OR SWEATING ?*NAUSEA AND VOMITING THAT IS NOT CONTROLLED WITH YOUR NAUSEA MEDICATION ?*UNUSUAL SHORTNESS OF BREATH ?*UNUSUAL BRUISING OR BLEEDING ?*URINARY PROBLEMS (pain or burning when urinating, or frequent urination) ?*BOWEL PROBLEMS (unusual diarrhea, constipation, pain near the anus) ?TENDERNESS IN MOUTH AND THROAT WITH OR WITHOUT PRESENCE OF ULCERS (sore throat, sores in mouth, or a toothache) ?UNUSUAL RASH, SWELLING OR PAIN  ?UNUSUAL VAGINAL DISCHARGE OR ITCHING  ? ?Items with * indicate a potential emergency and should be followed up as soon as possible or go to the Emergency Department if any problems should occur. ? ?Please show the CHEMOTHERAPY ALERT CARD or IMMUNOTHERAPY ALERT CARD at  check-in to the Emergency Department and triage nurse. ? ?Should you have questions after your visit or need to cancel or reschedule your appointment, please contact [facility]ICAL [facility]COL[facility]  Dept: [phone]  and follow the prompts.  Office hours are 8:00 a.m. to 4:30 p.m. Monday - Friday. Please note that voicemails left after 4:00 p.m. may not be returned until the following business day.  We are closed weekends and major holidays. You have access to a nurse at all times for urgent questions. Please call the main number to the clinic Dept: [phone] and follow the prompts. ? ? ?For any non-urgent questions, you may also contact your provider using MyChart. We now offer e-Visits for anyone 18 and older to request care online for non-urgent symptoms. For details visit [email or web address]. ?  ?Also download the MyChart app! Go to the app store, search "MyChart", open the app, select [place], and log in with your MyChart username and password. ? ?Due to Covid, a mask is required upon entering the hospital/clinic. If you do not have a mask, one will be given to you upon arrival. For doctor visits, patients may have 1 support person aged 18 or older with them. For treatment visits, patients cannot have anyone with them due to current Covid guidelines and our immunocompromised population.  ? ?

## 2021-04-30 ENCOUNTER — Ambulatory Visit (INDEPENDENT_AMBULATORY_CARE_PROVIDER_SITE_OTHER): Payer: Managed Care, Other (non HMO) | Admitting: Plastic Surgery

## 2021-04-30 VITALS — BP 108/71 | HR 97 | Ht 66.0 in | Wt 140.6 lb

## 2021-04-30 DIAGNOSIS — C50411 Malignant neoplasm of upper-outer quadrant of right female breast: Secondary | ICD-10-CM | POA: Diagnosis not present

## 2021-04-30 DIAGNOSIS — Z171 Estrogen receptor negative status [ER-]: Secondary | ICD-10-CM

## 2021-04-30 NOTE — Addendum Note (Signed)
Addended by: Threasa Beards K on: 04/30/2021 05:00 PM   Modules accepted: Orders

## 2021-04-30 NOTE — Progress Notes (Signed)
Referring Provider [name], MD [place],  [place] [address]   CC: No chief complaint on file.     Robin Farley is an 56 y.o. female.  HPI: Patient presents to discuss breast reconstruction.  She has a right sided cancer.  She is completing neoadjuvant chemotherapy.  She is planning to have a right sided skin sparing mastectomy with Dr. Georgette Dover.  She would like to discuss immediate reconstruction.  She is planning to have radiation after the fact.  Allergies  Allergen Reactions   Promethazine Hcl     Legs twitch    Outpatient Encounter Medications as of 04/30/2021  Medication Sig   atorvastatin (LIPITOR) 20 MG tablet Take 1 tablet (20 mg total) by mouth daily.   Cholecalciferol (THERA-D 2000) 50 MCG (2000 UT) TABS 1 tab by mouth once daily (Patient not taking: Reported on 04/22/2021)   Emollient (ROC RETINOL CORREXION EYE) CREA Use as directed daily (Patient not taking: Reported on 04/30/2021)   fluconazole (DIFLUCAN) 200 MG tablet Take 1 tablet (200 mg total) by mouth daily.   lidocaine-prilocaine (EMLA) cream Apply to affected area once   magic mouthwash (nystatin, lidocaine, diphenhydrAMINE, alum & mag hydroxide) suspension Swish and spit 5 mls 4 times a day as needed before meals and at bedtime   metFORMIN (GLUCOPHAGE-XR) 500 MG 24 hr tablet Take 1 tablet (500 mg total) by mouth daily with breakfast.   mirtazapine (REMERON) 7.5 MG tablet Take 1 tablet (7.5 mg total) by mouth at bedtime. (Patient not taking: Reported on 04/30/2021)   ondansetron (ZOFRAN) 8 MG tablet Take 1 tablet (8 mg total) by mouth every 8 (eight) hours as needed for nausea or vomiting.   prochlorperazine (COMPAZINE) 10 MG tablet Take 1 tablet (10 mg total) by mouth every 6 (six) hours as needed (Nausea or vomiting).   valACYclovir (VALTREX) 1000 MG tablet TAKE 1 TABLET BY MOUTH TWICE A DAY (Patient taking differently: Take 1,000 mg by mouth daily.)   zolpidem (AMBIEN) 5 MG tablet Take 1 tablet  (5 mg total) by mouth at bedtime as needed for sleep. (Patient not taking: Reported on 04/30/2021)   Facility-Administered Encounter Medications as of 04/30/2021  Medication   sodium chloride flush (NS) 0.9 % injection 10 mL   sodium chloride flush (NS) 0.9 % injection 10 mL     Past Medical History:  Diagnosis Date   Abdominal pain, generalized 12/27/2008   Acute sinusitis, unspecified 02/26/2007   ADD 03/13/2010   ALLERGIC RHINITIS 02/23/2007   ANEMIA 02/05/2007   Anxiety 11/17/2011   ATTENTION DEFICIT DISORDER, HX OF 02/05/2007   Breast cancer ([facility])    Deviated nasal septum 02/05/2007   Diabetes mellitus without complication ([facility])    Family history of breast cancer 11/20/2020   Family history of melanoma 11/20/2020   FATIGUE 02/23/2007   HYPERLIPIDEMIA 02/23/2007   Personal history of malignant melanoma 11/20/2020   SWELLING MASS OR LUMP IN HEAD AND NECK 07/27/2008   UTI 02/23/2007    Past Surgical History:  Procedure Laterality Date   BROW LIFT     CESAREAN SECTION     COSMETIC SURGERY  1995   Nasal   MELANOMA EXCISION WITH SENTINEL LYMPH NODE BIOPSY Left 10/24/2012   Procedure: MELANOMA wide EXCISION left lateral thigh WITH SENTINEL LYMPH NODE BIOPSY left groin;  Surgeon: Edward Jolly, MD;  Location: [facility];  Service: General;  Laterality: Left;   NASAL SEPTUM SURGERY  PORTACATH PLACEMENT Right 12/02/2020   Procedure: INSERTION PORT-A-CATH;  Surgeon: Donnie Mesa, MD;  Location: [facility];  Service: General;  Laterality: Right;    Family History  Problem Relation Age of Onset   Hypertension Mother    Breast cancer Mother 49   COPD Father    Atrial fibrillation Father    Atrial fibrillation Sister    Kidney cancer Maternal Aunt        dx 1s   Leukemia Paternal Aunt        dx after 80   Cancer Paternal Aunt        unknown type; dx after 55   Brain cancer Paternal Grandfather 73       brain tumor per pt   Melanoma  Cousin        arm; no mets per pt   Alcohol abuse Other    Depression Other    Glaucoma Other    Breast cancer Other        MGM's sisters, x3, dx after 63   Colon polyps Neg Hx     Social History   Social History Narrative   Not on file  No tobacco use  Review of Systems General: Denies fevers, chills, weight loss CV: Denies chest pain, shortness of breath, palpitations  Physical Exam Vitals with BMI 04/29/2021 04/22/2021 04/12/2021  Height - 5\' 6"  -  Weight 138 lbs 137 lbs 6 oz -  BMI 86.76 72.09 -  Systolic 470 962 836  Diastolic 86 81 77  Pulse 87 99 74    General:  No acute distress,  Alert and oriented, Non-Toxic, Normal speech and affect Breast: She has grade 2-3 ptosis.  Base width 12 cm.  Probably around C cup in volume.  Assessment/Plan Patient presents to discuss reconstruction for upcoming mastectomy.  We briefly discussed autologous reconstruction which she did not want to pursue at this time.  She favored implant-based reconstruction which we discussed in detail.  I discussed that a tissue expander will be placed at the time of her mastectomy and subsequently expanded.  If she did require postmastectomy radiation she would then undergo radiation after which 3 to 6 months would pass before switching the expander to a gel implant.  At that time we could do procedures on the left side for symmetry which I would anticipate would be a left potentially with an implant as well.  We discussed risks include bleeding, infection, damage to surrounding structures need for additional procedures.  We discussed the potential for failure of the reconstruction or wound healing complications that would result in removal of the expander of the implant.  She is fully understanding and interested in moving forward.  Cindra Presume 04/30/2021, 12:53 PM

## 2021-05-02 ENCOUNTER — Other Ambulatory Visit (HOSPITAL_COMMUNITY): Payer: Managed Care, Other (non HMO)

## 2021-05-02 NOTE — Pre-Procedure Instructions (Signed)
Surgical Instructions    Your procedure is scheduled on 05/08/21.  Report to [facility] Main Entrance "A" at 11:30 A.M., then check in with the Admitting office.  Call this number if you have problems the morning of surgery:  [phone]   If you have any questions prior to your surgery date call [phone]: Open Monday-Friday 8am-4pm    Remember:  Do not eat after midnight the night before your surgery  You may drink clear liquids until 10:30 the morning of your surgery.   Clear liquids allowed are: Water, Non-Citrus Juices (without pulp), Carbonated Beverages, Clear Tea, Black Coffee ONLY (NO MILK, CREAM OR POWDERED CREAMER of any kind), and Gatorade  **Please complete your PRE-SURGERY ENSURE that was provided to you by 10:30 the morning of surgery.  Please, if able, drink it in one setting. DO NOT SIP.     Take these medicines the morning of surgery with A SIP OF WATER  atorvastatin (LIPITOR) fluconazole (DIFLUCAN)  valACYclovir (VALTREX)  As of today, STOP taking any Aspirin (unless otherwise instructed by your surgeon) Aleve, Naproxen, Ibuprofen, Motrin, Advil, Goody's, BC's, all herbal medications, fish oil, and all vitamins.  WHAT DO I DO ABOUT MY DIABETES MEDICATION?  Do not take metFORMIN (GLUCOPHAGE-XR)the morning of surgery.   HOW TO MANAGE YOUR DIABETES BEFORE AND AFTER SURGERY  Why is it important to control my blood sugar before and after surgery? Improving blood sugar levels before and after surgery helps healing and can limit problems. A way of improving blood sugar control is eating a healthy diet by:  Eating less sugar and carbohydrates  Increasing activity/exercise  Talking with your doctor about reaching your blood sugar goals High blood sugars (greater than 180 mg/dL) can raise your risk of infections and slow your recovery, so you will need to focus on controlling your diabetes during the weeks before surgery. Make sure that the doctor who takes care  of your diabetes knows about your planned surgery including the date and location.  How do I manage my blood sugar before surgery? Check your blood sugar at least 4 times a day, starting 2 days before surgery, to make sure that the level is not too high or low.  Check your blood sugar the morning of your surgery when you wake up and every 2 hours until you get to the Short Stay unit.  If your blood sugar is less than 70 mg/dL, you will need to treat for low blood sugar: Do not take insulin. Treat a low blood sugar (less than 70 mg/dL) with  cup of clear juice (cranberry or apple), 4 glucose tablets, OR glucose gel. Recheck blood sugar in 15 minutes after treatment (to make sure it is greater than 70 mg/dL). If your blood sugar is not greater than 70 mg/dL on recheck, call [phone] for further instructions. Report your blood sugar to the short stay nurse when you get to Short Stay.  If you are admitted to the hospital after surgery: Your blood sugar will be checked by the staff and you will probably be given insulin after surgery (instead of oral diabetes medicines) to make sure you have good blood sugar levels. The goal for blood sugar control after surgery is 80-180 mg/dL.   After your COVID test   You are not required to quarantine however you are required to wear a well-fitting mask when you are out and around people not in your household.  If your mask becomes wet or soiled, replace with a  new one.  Wash your hands often with soap and water for 20 seconds or clean your hands with an alcohol-based hand sanitizer that contains at least 60% alcohol.  Do not share personal items.  Notify your provider: if you are in close contact with someone who has COVID  or if you develop a fever of 100.4 or greater, sneezing, cough, sore throat, shortness of breath or body aches.           Do not wear jewelry or makeup Do not wear lotions, powders, perfumes/colognes, or deodorant. Do not  shave 48 hours prior to surgery.  Men may shave face and neck. Do not bring valuables to the hospital. DO Not wear nail polish, gel polish, artificial nails, or any other type of covering on natural nails (fingers and toes) If you have artificial nails or gel coating that need to be removed by a nail salon, please have this removed prior to surgery. Artificial nails or gel coating may interfere with anesthesia's ability to adequately monitor your vital signs.             [facility] is not responsible for any belongings or valuables.  Do NOT Smoke (Tobacco/Vaping)  24 hours prior to your procedure  If you use a CPAP at night, you may bring your mask for your overnight stay.   Contacts, glasses, hearing aids, dentures or partials may not be worn into surgery, please bring cases for these belongings   For patients admitted to the hospital, discharge time will be determined by your treatment team.   Patients discharged the day of surgery will not be allowed to drive home, and someone needs to stay with them for 24 hours.  NO VISITORS WILL BE ALLOWED IN PRE-OP WHERE PATIENTS ARE PREPPED FOR SURGERY.  ONLY 1 SUPPORT PERSON MAY BE PRESENT IN THE WAITING ROOM WHILE YOU ARE IN SURGERY.  IF YOU ARE TO BE ADMITTED, ONCE YOU ARE IN YOUR ROOM YOU WILL BE ALLOWED TWO (2) VISITORS. 1 (ONE) VISITOR MAY STAY OVERNIGHT BUT MUST ARRIVE TO THE ROOM BY 8pm.  Minor children may have two parents present. Special consideration for safety and communication needs will be reviewed on a case by case basis.  Special instructions:    Oral Hygiene is also important to reduce your risk of infection.  Remember - BRUSH YOUR TEETH THE MORNING OF SURGERY WITH YOUR REGULAR TOOTHPASTE   [facility]- Preparing For Surgery  Before surgery, you can play an important role. Because skin is not sterile, your skin needs to be as free of germs as possible. You can reduce the number of germs on your skin by washing with CHG  (chlorahexidine gluconate) Soap before surgery.  CHG is an antiseptic cleaner which kills germs and bonds with the skin to continue killing germs even after washing.     Please do not use if you have an allergy to CHG or antibacterial soaps. If your skin becomes reddened/irritated stop using the CHG.  Do not shave (including legs and underarms) for at least 48 hours prior to first CHG shower. It is OK to shave your face.  Please follow these instructions carefully.     Shower the NIGHT BEFORE SURGERY and the MORNING OF SURGERY with CHG Soap.   If you chose to wash your hair, wash your hair first as usual with your normal shampoo. After you shampoo, rinse your hair and body thoroughly to remove the shampoo.  Then [employer] and genitals (private  parts) with your normal soap and rinse thoroughly to remove soap.  After that Use CHG Soap as you would any other liquid soap. You can apply CHG directly to the skin and wash gently with a scrungie or a clean washcloth.   Apply the CHG Soap to your body ONLY FROM THE NECK DOWN.  Do not use on open wounds or open sores. Avoid contact with your eyes, ears, mouth and genitals (private parts). Wash Face and genitals (private parts)  with your normal soap.   Wash thoroughly, paying special attention to the area where your surgery will be performed.  Thoroughly rinse your body with warm water from the neck down.  DO NOT shower/wash with your normal soap after using and rinsing off the CHG Soap.  Pat yourself dry with a CLEAN TOWEL.  Wear CLEAN PAJAMAS to bed the night before surgery  Place CLEAN SHEETS on your bed the night before your surgery  DO NOT SLEEP WITH PETS.   Day of Surgery:  Take a shower with CHG soap. Wear Clean/Comfortable clothing the morning of surgery Do not apply any deodorants/lotions.   Remember to brush your teeth WITH YOUR REGULAR TOOTHPASTE.   Please read over the following fact sheets that you were given.  Robin Farley

## 2021-05-05 ENCOUNTER — Other Ambulatory Visit: Payer: Self-pay | Admitting: Surgery

## 2021-05-05 ENCOUNTER — Encounter (HOSPITAL_BASED_OUTPATIENT_CLINIC_OR_DEPARTMENT_OTHER)
Admission: RE | Admit: 2021-05-05 | Discharge: 2021-05-05 | Disposition: A | Discharge status: Home / Self Care | Payer: Managed Care, Other (non HMO) | Source: Ambulatory Visit | Attending: Surgery | Admitting: Surgery

## 2021-05-05 ENCOUNTER — Inpatient Hospital Stay (HOSPITAL_COMMUNITY): Admission: RE | Admit: 2021-05-05 | Payer: Managed Care, Other (non HMO) | Source: Ambulatory Visit

## 2021-05-05 ENCOUNTER — Encounter (HOSPITAL_BASED_OUTPATIENT_CLINIC_OR_DEPARTMENT_OTHER): Payer: Self-pay | Admitting: Surgery

## 2021-05-05 DIAGNOSIS — Z171 Estrogen receptor negative status [ER-]: Secondary | ICD-10-CM

## 2021-05-05 LAB — CBC
HCT: 30.9 % — ABNORMAL LOW (ref 36.0–46.0)
Hemoglobin: 10.4 g/dL — ABNORMAL LOW (ref 12.0–15.0)
MCH: 34.7 pg — ABNORMAL HIGH (ref 26.0–34.0)
MCHC: 33.7 g/dL (ref 30.0–36.0)
MCV: 103 fL — ABNORMAL HIGH (ref 80.0–100.0)
Platelets: 337 10*3/uL (ref 150–400)
RBC: 3 MIL/uL — ABNORMAL LOW (ref 3.87–5.11)
RDW: 14.6 % (ref 11.5–15.5)
WBC: 6.2 10*3/uL (ref 4.0–10.5)
nRBC: 0 % (ref 0.0–0.2)

## 2021-05-05 LAB — BASIC METABOLIC PANEL
Anion gap: 8 (ref 5–15)
BUN: 6 mg/dL (ref 6–20)
CO2: 24 mmol/L (ref 22–32)
Calcium: 9.2 mg/dL (ref 8.9–10.3)
Chloride: 107 mmol/L (ref 98–111)
Creatinine, Ser: 0.79 mg/dL (ref 0.44–1.00)
GFR, Estimated: >60 mL/min (ref >60)
Glucose, Bld: 91 mg/dL (ref 70–99)
Potassium: 4.4 mmol/L (ref 3.5–5.1)
Sodium: 139 mmol/L (ref 135–145)

## 2021-05-05 NOTE — Progress Notes (Signed)
Patient ID: Robin Farley, female    DOB: September 29, 1965, 56 y.o.   MRN: [id number]  Chief Complaint  Patient presents with   Pre-op Exam      ICD-10-CM   1. Malignant neoplasm of upper-outer quadrant of right breast in female, estrogen receptor negative ([facility])  C50.411    Z17.1        History of Present Illness: Robin Farley is a 56 y.o.  female  with a history of right-sided breast cancer currently undergoing neoadjuvant chemotherapy.  She presents for preoperative evaluation for upcoming procedure, right-sided mastectomy with immediate breast reconstruction using tissue expander and Flex HD, scheduled for 05/08/2021 with Dr. Claudia Desanctis.  The patient has not had problems with anesthesia.  Patient tells me that it was initially going to be NSM, but now it is a total right-sided mastectomy and there will definitely be postsurgical radiation required.  She is currently on Keytruda.  She denies any personal or family history of blood clots or clotting disorder.  She recently has been having "mouth sores" and thrush, but otherwise denies any recent illness, infections, or traumas.  She denies any chest pain or shortness of breath.  She confirms that she is currently a C cup and expressed that she would like to be a D cup if possible.  Patient denies any personal or family history of blood clots or clotting disorder.  She does not smoke or use nicotine products.  She does have a port in place.  Summary of Previous Visit: Patient was seen for initial consult 04/30/2021 by Dr. Claudia Desanctis.  At that time, she favored implant-based reconstruction.  If she requires postmastectomy radiation, she would have to wait 3 to 6 months before implant exchange.  At that time, could do surgery on the contralateral side for symmetry.  Base width 12 cm.  C cup volume.  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  She plans to work remotely at that time.  PMH Significant for: Right-sided breast cancer HLD,  GERD.  She takes metformin as a prophylaxis for borderline high sugars, but states that she is not formally prediabetic or diabetic.   Past Medical History: Allergies: Allergies  Allergen Reactions   Promethazine Hcl     Legs twitch    Current Medications:  Current Outpatient Medications:    HYDROcodone-acetaminophen (NORCO) 5-325 MG tablet, Take 1 tablet by mouth every 6 (six) hours as needed for up to 5 days for severe pain., Disp: 20 tablet, Rfl: 0   ondansetron (ZOFRAN-ODT) 4 MG disintegrating tablet, Take 1 tablet (4 mg total) by mouth every 8 (eight) hours as needed for nausea or vomiting., Disp: 20 tablet, Rfl: 0   sulfamethoxazole-trimethoprim (BACTRIM DS) 800-160 MG tablet, Take 1 tablet by mouth 2 (two) times daily for 14 days., Disp: 28 tablet, Rfl: 0   atorvastatin (LIPITOR) 20 MG tablet, Take 1 tablet (20 mg total) by mouth daily., Disp: 90 tablet, Rfl: 3   fluconazole (DIFLUCAN) 200 MG tablet, Take 1 tablet (200 mg total) by mouth daily., Disp: 30 tablet, Rfl: 1   magic mouthwash (nystatin, lidocaine, diphenhydrAMINE, alum & mag hydroxide) suspension, Swish and spit 5 mls 4 times a day as needed before meals and at bedtime, Disp: 240 mL, Rfl: 3   metFORMIN (GLUCOPHAGE-XR) 500 MG 24 hr tablet, Take 1 tablet (500 mg total) by mouth daily with breakfast., Disp: 90 tablet, Rfl: 3   ondansetron (ZOFRAN) 8 MG tablet, Take 1  tablet (8 mg total) by mouth every 8 (eight) hours as needed for nausea or vomiting. (Patient not taking: Reported on 05/06/2021), Disp: 20 tablet, Rfl: 3   valACYclovir (VALTREX) 1000 MG tablet, TAKE 1 TABLET BY MOUTH TWICE A DAY (Patient taking differently: Take 1,000 mg by mouth daily.), Disp: 60 tablet, Rfl: 1 No current facility-administered medications for this visit.  Facility-Administered Medications Ordered in Other Visits:    sodium chloride flush (NS) 0.9 % injection 10 mL, 10 mL, Intracatheter, PRN, Magrinat, Virgie Dad, MD, 10 mL at 01/21/21 1144    sodium chloride flush (NS) 0.9 % injection 10 mL, 10 mL, Intracatheter, PRN, Magrinat, Virgie Dad, MD, 10 mL at 03/20/21 1159  Past Medical Problems: Past Medical History:  Diagnosis Date   Abdominal pain, generalized 12/27/2008   Acute sinusitis, unspecified 02/26/2007   ADD 03/13/2010   ALLERGIC RHINITIS 02/23/2007   ANEMIA 02/05/2007   Anxiety 11/17/2011   ATTENTION DEFICIT DISORDER, HX OF 02/05/2007   Breast cancer ([facility])    Deviated nasal septum 02/05/2007   Diabetes mellitus without complication ([facility])    Family history of breast cancer 11/20/2020   Family history of melanoma 11/20/2020   FATIGUE 02/23/2007   HYPERLIPIDEMIA 02/23/2007   Personal history of malignant melanoma 11/20/2020   SWELLING MASS OR LUMP IN HEAD AND NECK 07/27/2008   UTI 02/23/2007    Past Surgical History: Past Surgical History:  Procedure Laterality Date   BROW LIFT     CESAREAN [place]   Nasal   MELANOMA EXCISION WITH SENTINEL LYMPH NODE BIOPSY Left 10/24/2012   Procedure: MELANOMA wide EXCISION left lateral thigh WITH SENTINEL LYMPH NODE BIOPSY left groin;  Surgeon: Edward Jolly, MD;  Location: [facility];  Service: General;  Laterality: Left;   NASAL SEPTUM SURGERY     PORTACATH PLACEMENT Right 12/02/2020   Procedure: INSERTION PORT-A-CATH;  Surgeon: Donnie Mesa, MD;  Location: [facility];  Service: General;  Laterality: Right;    Social History: Social History   Socioeconomic History   Marital status: Married    Spouse name: Not on file   Number of children: 2   Years of education: Not on file   Highest education level: Not on file  Occupational History   Occupation: master's in fine arts [employer]    Employer: [employer]  Tobacco Use   Smoking status: Never   Smokeless tobacco: Never  Substance and Sexual Activity   Alcohol use: Yes    Comment: Very rare occasions   Drug use: Never   Sexual activity: Yes     Birth control/protection: None, Post-menopausal  Other Topics Concern   Not on file  Social History Narrative   Not on file   Social Determinants of Health   Financial Resource Strain: Low Risk    Difficulty of Paying Living Expenses: Not hard at all  Food Insecurity: No Food Insecurity   Worried About Charity fundraiser in the Last Year: Never true   Arboriculturist in the Last Year: Never true  Transportation Needs: No Transportation Needs   Lack of Transportation (Medical): No   Lack of Transportation (Non-Medical): No  Physical Activity: Not on file  Stress: Not on file  Social Connections: Not on file  Intimate Partner Violence: Not on file    Family History: Family History  Problem Relation Age of Onset   Hypertension Mother    Breast cancer  Mother 37   COPD Father    Atrial fibrillation Father    Atrial fibrillation Sister    Kidney cancer Maternal Aunt        dx 54s   Leukemia Paternal Aunt        dx after 40   Cancer Paternal Aunt        unknown type; dx after 30   Brain cancer Paternal Grandfather 76       brain tumor per pt   Melanoma Cousin        arm; no mets per pt   Alcohol abuse Other    Depression Other    Glaucoma Other    Breast cancer Other        MGM's sisters, x3, dx after 65   Colon polyps Neg Hx     Review of Systems: ROS Denies recent fevers, chills, infection, chest pain, or shortness of breath.  Physical Exam: Vital Signs BP 123/65 (BP Location: Left Arm, Patient Position: Sitting, Cuff Size: Large)    Pulse 94    Ht 5\' 6"  (1.676 m)    Wt 140 lb (63.5 kg)    LMP 04/27/2016    SpO2 100%    BMI 22.60 kg/m   Physical Exam Constitutional:      General: Not in acute distress.    Appearance: Normal appearance. Not ill-appearing.  HENT:     Head: Normocephalic and atraumatic.  Eyes:     Pupils: Pupils are equal, round. Cardiovascular:     Rate and Rhythm: Normal rate.    Pulses: Normal pulses.  Pulmonary:     Effort: No  respiratory distress or increased work of breathing.  Speaks in full sentences. Abdominal:     General: Abdomen is flat. No distension.   Musculoskeletal: Normal range of motion. No lower extremity swelling or edema. No varicosities. Skin:    General: Skin is warm and dry.     Findings: No erythema or rash.  Neurological:     Mental Status: Alert and oriented to person, place, and time.  Psychiatric:        Mood and Affect: Mood normal.        Behavior: Behavior normal.    Assessment/Plan: The patient is scheduled for right side immediate breast reconstruction 05/08/2021 with Dr. Claudia Desanctis.  Risks, benefits, and alternatives of procedure discussed, questions answered and consent obtained.    Smoking Status: Non-smoker. Last Mammogram: 04/11/2021; Results: BI-RADS Category 4: Suspicious, biopsy-proven known right breast malignancy.  Caprini Score: 7; Risk Factors include: Age, breast cancer, and length of planned surgery. Recommendation for mechanical and possibly pharmacological prophylaxis. Will discuss with surgeon and prescribe Lovenox if indicated.  Encourage early ambulation.   Pictures obtained: Today.  Post-op Rx sent to pharmacy: Bactrim, Norco, Zofran.  Patient was provided with the General Surgical Risk consent document and Pain Medication Agreement prior to their appointment.  They had adequate time to read through the risk consent documents and Pain Medication Agreement. We also discussed them in person together during this preop appointment. All of their questions were answered to their satisfaction.  Recommended calling if they have any further questions.  Risk consent form and Pain Medication Agreement to be scanned into patient's chart.  The risks that can be encountered with and after placement of a breast expander placement were discussed and include the following but not limited to these: bleeding, infection, delayed healing, anesthesia risks, skin sensation changes, injury  to structures including nerves, blood vessels, and  muscles which may be temporary or permanent, allergies to tape, suture materials and glues, blood products, topical preparations or injected agents, skin contour irregularities, skin discoloration and swelling, deep vein thrombosis, cardiac and pulmonary complications, pain, which may persist, fluid accumulation, wrinkling of the skin over the expander, changes in nipple or breast sensation, expander leakage or rupture, faulty position of the expander, persistent pain, formation of tight scar tissue around the expander (capsular contracture), possible need for revisional surgery or staged procedures.    Electronically signed by: Krista Blue, PA-C 05/06/2021 12:54 PM

## 2021-05-05 NOTE — Progress Notes (Signed)

## 2021-05-06 ENCOUNTER — Ambulatory Visit (INDEPENDENT_AMBULATORY_CARE_PROVIDER_SITE_OTHER): Payer: Managed Care, Other (non HMO) | Admitting: Physician Assistant

## 2021-05-06 ENCOUNTER — Other Ambulatory Visit: Payer: Self-pay

## 2021-05-06 ENCOUNTER — Ambulatory Visit: Payer: Managed Care, Other (non HMO) | Attending: Hematology and Oncology

## 2021-05-06 ENCOUNTER — Encounter: Payer: Self-pay | Admitting: Physician Assistant

## 2021-05-06 VITALS — BP 123/65 | HR 94 | Ht 66.0 in | Wt 140.0 lb

## 2021-05-06 DIAGNOSIS — R293 Abnormal posture: Secondary | ICD-10-CM | POA: Diagnosis present

## 2021-05-06 DIAGNOSIS — C50411 Malignant neoplasm of upper-outer quadrant of right female breast: Secondary | ICD-10-CM

## 2021-05-06 DIAGNOSIS — Z171 Estrogen receptor negative status [ER-]: Secondary | ICD-10-CM

## 2021-05-06 MED ORDER — SULFAMETHOXAZOLE-TRIMETHOPRIM 800-160 MG PO TABS: 1.0000 | ORAL_TABLET | Freq: Two times a day (BID) | ORAL | 0 refills | Status: AC

## 2021-05-06 MED ORDER — HYDROCODONE-ACETAMINOPHEN 5-325 MG PO TABS: 1.0000 | ORAL_TABLET | Freq: Four times a day (QID) | ORAL | 0 refills | Status: AC | PRN

## 2021-05-06 MED ORDER — ONDANSETRON 4 MG PO TBDP: 4.0000 mg | ORAL_TABLET | Freq: Three times a day (TID) | ORAL | 0 refills | Status: DC | PRN

## 2021-05-06 NOTE — Patient Instructions (Signed)
Physical Therapy Information for After Breast Cancer Surgery/Treatment:  Lymphedema is a swelling condition that you may be at risk for in your arm if you have lymph nodes removed from the armpit area.  After a sentinel node biopsy, the risk is approximately 5-9% and is higher after an axillary node dissection.  There is treatment available for this condition and it is not life-threatening.  Contact your physician or physical therapist with concerns. You may begin the 4 shoulder/posture exercises (see additional sheet) when permitted by your physician (typically a week after surgery).  If you have drains, you may need to wait until those are removed before beginning range of motion exercises.  A general recommendation is to not lift your arms above shoulder height until drains are removed.  These exercises should be done to your tolerance and gently.  This is not a "no pain/no gain" type of recovery so listen to your body and stretch into the range of motion that you can tolerate, stopping if you have pain.  If you are having immediate reconstruction, ask your plastic surgeon about doing exercises as he or she may want you to wait. We encourage you to attend the free one time ABC (After Breast Cancer) class offered by [facility].  You will learn information related to lymphedema risk, prevention and treatment and additional exercises to regain mobility following surgery.  You can call [phone] for more information.  This is offered the 1st and 3rd Monday of each month.  You only attend the class one time. While undergoing any medical procedure or treatment, try to avoid blood pressure being taken or needle sticks from occurring on the arm on the side of cancer.   This recommendation begins after surgery and continues for the rest of your life.  This may help reduce your risk of getting lymphedema (swelling in your arm). An excellent resource for those seeking information on  lymphedema is the National Lymphedema Network's web site. It can be accessed at [place].org If you notice swelling in your hand, arm or breast at any time following surgery (even if it is many years from now), please contact your doctor or physical therapist to discuss this.  Lymphedema can be treated at any time but it is easier for you if it is treated early on.  If you feel like your shoulder motion is not returning to normal in a reasonable amount of time, please contact your surgeon or physical therapist.  [facility] [place], [place], [place] [phone]; [address], [place], [place] [address] ABC CLASS After Breast Cancer Class  After Breast Cancer Class is a specially designed exercise class to assist you in a safe recover after having breast cancer surgery.  In this class you will learn how to get back to full function whether your drains were just removed or if you had surgery a month ago.  This one-time class is held the 1st and 3rd Monday of every month from 11:00 a.m. until 12:00 noon and is presently a WEBEX class  This class is FREE and space is limited. For more information or to register for the next available class, call [phone].  Class Goals  Understand specific stretches to improve the flexibility of you chest and shoulder. Learn ways to safely strengthen your upper body and improve your posture. Understand the warning signs of infection and why you may be at risk for an arm infection. Learn about Lymphedema and prevention.  ** You do  not attend this class until after surgery.  Drains must be removed to participate  Patient was instructed today in a home exercise program today for post op shoulder range of motion. These included active assist shoulder flexion in sitting/supine, scapular retraction, wall walking with shoulder abduction, and hands behind head external rotation in supine.  She was encouraged to do these twice a day, holding 3 seconds and repeating 5 times  when permitted by her physician and after drains are removed.

## 2021-05-06 NOTE — Therapy (Signed)
[facility] @ [facility] [place], [place], [address] Phone: [phone]   Fax:  [phone]  Physical Therapy Evaluation  Patient Details  Name: Robin Farley MRN: [id number] Date of Birth: [birth year] Referring Provider (PT): Dr. Chryl Heck   Encounter Date: 05/06/2021   PT End of Session - 05/06/21 1459     Visit Number 1    Number of Visits 2    Date for PT Re-Evaluation 06/17/21    PT Start Time 1401    PT Stop Time 1450    PT Time Calculation (min) 49 min    Activity Tolerance Patient tolerated treatment well    Behavior During Therapy [facility] for tasks assessed/performed             Past Medical History:  Diagnosis Date   Abdominal pain, generalized 12/27/2008   Acute sinusitis, unspecified 02/26/2007   ADD 03/13/2010   ALLERGIC RHINITIS 02/23/2007   ANEMIA 02/05/2007   Anxiety 11/17/2011   ATTENTION DEFICIT DISORDER, HX OF 02/05/2007   Breast cancer ([facility])    Deviated nasal septum 02/05/2007   Diabetes mellitus without complication ([facility])    Family history of breast cancer 11/20/2020   Family history of melanoma 11/20/2020   FATIGUE 02/23/2007   HYPERLIPIDEMIA 02/23/2007   Personal history of malignant melanoma 11/20/2020   SWELLING MASS OR LUMP IN HEAD AND NECK 07/27/2008   UTI 02/23/2007    Past Surgical History:  Procedure Laterality Date   BROW LIFT     CESAREAN SECTION     COSMETIC SURGERY  1995   Nasal   MELANOMA EXCISION WITH SENTINEL LYMPH NODE BIOPSY Left 10/24/2012   Procedure: MELANOMA wide EXCISION left lateral thigh WITH SENTINEL LYMPH NODE BIOPSY left groin;  Surgeon: Edward Jolly, MD;  Location: [facility];  Service: General;  Laterality: Left;   NASAL SEPTUM SURGERY     PORTACATH PLACEMENT Right 12/02/2020   Procedure: INSERTION PORT-A-CATH;  Surgeon: Donnie Mesa, MD;  Location: [facility];  Service: General;  Laterality: Right;    There were no  vitals filed for this visit.    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 Pt lives in [place], [place] but they have a daughter at [facility] and they used to live in [place] and still have a home here.    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  She had neoadjuvant chemo, and is scheduled for a right mastectomy with immediate expander on 05/08/2021.  She will be having radiation.    Patient Stated Goals to have pre-op assessment and gain info from provider    Currently in Pain? No/denies    Pain Score 0-No pain                [facility] PT Assessment - 05/06/21 0001       Assessment   Medical Diagnosis Right breast CA    Referring Provider (PT) Dr. Chryl Heck    Onset Date/Surgical Date 05/08/21    Hand Dominance Right      Precautions   Precaution Comments active CA      Restrictions   Weight Bearing Restrictions No      Balance  Screen   Has the patient fallen in the past 6 months No    Has the patient had a decrease in activity level because of a fear of falling?  No    Is the patient reluctant to leave their home because of a fear of falling?  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,gardening, painting, reading      Cognition   Overall Cognitive Status Within Functional Limits for tasks assessed      Observation/Other Assessments   Observations WNL      Posture/Postural Control    Posture/Postural Control Postural limitations    Postural Limitations Rounded Shoulders;Forward head      AROM   AROM Assessment Site Shoulder    Right/Left Shoulder Right;Left    Right Shoulder Extension 60 Degrees    Right Shoulder Flexion 157 Degrees    Right Shoulder ABduction 180 Degrees    Right Shoulder Internal Rotation 70 Degrees    Right Shoulder External Rotation 105 Degrees    Left Shoulder Extension 63 Degrees    Left Shoulder Flexion 160 Degrees    Left Shoulder ABduction 180 Degrees    Left Shoulder Internal Rotation 70 Degrees    Left Shoulder External Rotation 105 Degrees               LYMPHEDEMA/ONCOLOGY QUESTIONNAIRE - 05/06/21 0001       Type   Cancer Type Invasive Ductal Carcinoma, Right breast      Surgeries   Mastectomy Date 05/08/21    Lumpectomy Date --    Sentinel Lymph Node Biopsy Date 05/08/21      Treatment   Active Chemotherapy Treatment No    Past Chemotherapy Treatment Yes    Active Radiation Treatment No    Past Radiation Treatment No    Current Hormone Treatment No    Past Hormone Therapy No      What other symptoms do you have   Are you Having Heaviness or Tightness No    Are you having Pain No    Are you having pitting edema No    Is it Hard or Difficult finding clothes that fit No    Do you have infections No    Is there Decreased scar mobility No      Right Upper Extremity Lymphedema   10 cm Proximal to Olecranon Process 25 cm    Olecranon Process 23.8 cm    10 cm Proximal to Ulnar Styloid Process 19.4 cm    Just Proximal to Ulnar Styloid Process 15 cm    At Base of 2nd Digit 6.1 cm      Left Upper Extremity Lymphedema   10 cm Proximal to Olecranon Process 25.1 cm    Olecranon Process 24.1 cm    10 cm Proximal to Ulnar Styloid Process 19 cm    Just Proximal to Ulnar Styloid Process 15 cm    At Base of 2nd Digit 6 cm             L-DEX FLOWSHEETS - 05/06/21 1400       L-DEX LYMPHEDEMA SCREENING   Measurement  Type Unilateral  L-DEX MEASUREMENT EXTREMITY Upper Extremity    POSITION  Standing    DOMINANT SIDE Right    At Risk Side Right    BASELINE SCORE (UNILATERAL) 0.9            The patient was assessed using the L-Dex machine today to produce a lymphedema index baseline score. The patient will be reassessed on a regular basis (typically every 3 months) to obtain new L-Dex scores. If the score is > 6.5 points away from his/her baseline score indicating onset of subclinical lymphedema, it will be recommended to wear a compression garment for 4 weeks, 12 hours per day and then be reassessed. If the score continues to be > 6.5 points from baseline at reassessment, we will initiate lymphedema treatment. Assessing in this manner has a 95% rate of preventing clinically significant lymphedema.       Katina Dung - 05/06/21 0001     Open a tight or new jar Mild difficulty    Do heavy household chores (wash walls, wash floors) Moderate difficulty   decreased strength from chemo   Carry a shopping bag or briefcase No difficulty    Wash your back No difficulty    Use a knife to cut food No difficulty    Recreational activities in which you take some force or impact through your arm, shoulder, or hand (golf, hammering, tennis) Moderate difficulty   decreased strength from chemo   During the past week, to what extent has your arm, shoulder or hand problem interfered with your normal social activities with family, friends, neighbors, or groups? Slightly    During the past week, to what extent has your arm, shoulder or hand problem limited your work or other regular daily activities Slightly    Arm, shoulder, or hand pain. Mild    Tingling (pins and needles) in your arm, shoulder, or hand None    Difficulty Sleeping No difficulty    DASH Score 18.18 % (mainly secondary to chemo)             Objective measurements completed on examination: See above findings.                PT  Education - 05/06/21 1458     Education Details 4 post op exercises, ABC class, SOZO screens, lymphedema    Person(s) Educated Patient;Spouse    Methods Explanation;Handout    Comprehension Verbalized understanding                 PT Long Term Goals - 05/06/21 1509       PT LONG TERM GOAL #1   Title Pts right shoulder ROM will be within 5-10 degrees of baseline ROM    Time 6    Period Weeks    Status New    Target Date 06/17/21             Breast Clinic Goals - 05/06/21 1508       Patient will be able to verbalize understanding of pertinent lymphedema risk reduction practices relevant to her diagnosis specifically related to skin care.   Time 1    Period Days    Status Achieved    Target Date 05/06/21      Patient will be able to return demonstrate and/or verbalize understanding of the post-op home exercise program related to regaining shoulder range of motion.   Time 1    Period Days    Target Date 05/06/21      Patient will  be able to verbalize understanding of the importance of attending the postoperative After Breast Cancer Class for further lymphedema risk reduction education and therapeutic exercise.   Time 1    Period Days    Status Achieved    Target Date 05/06/21                   Plan - 05/06/21 1500     Clinical Impression Statement Baseline assessments performed for shoulder ROM, circumferential measures and baseline SOZO. pt and her husband were educated in importance of attending the ABC class, and SOZO screens every 3 months after surgery for 2 years. Pt was educated in 4 post op exercises and lymphedema precautions.  Pt was set up for her post op assessment, next SOZO and ABC class while here today.  She was advised to call with questions or concerns.    Personal Factors and Comorbidities Comorbidity 3+    Comorbidities Right triple neg. breast Cancer, prior melanoma, DM, anxiety,ADD    Stability/Clinical Decision Making  Stable/Uncomplicated    Clinical Decision Making Low    Rehab Potential Excellent    PT Frequency --   1 more visit in 6 weeks   PT Duration 6 weeks    PT Treatment/Interventions Therapeutic exercise;Patient/family education;ADLs/Self Care Home Management    PT Next Visit Plan Reassess post op; already set up ABC 2/20, SOZO,    PT Home Exercise Plan 4 post op exercises    Consulted and Agree with Plan of Care Patient;Family member/caregiver    Family Member Consulted husband [name]             Patient will benefit from skilled therapeutic intervention in order to improve the following deficits and impairments:  Postural dysfunction, Impaired UE functional use, Decreased range of motion, Decreased knowledge of precautions  Visit Diagnosis: Malignant neoplasm of upper-outer quadrant of right breast in female, estrogen receptor negative ([place])  Abnormal posture     Problem List Patient Active Problem List   Diagnosis Date Noted   Elevated LFTs 12/10/2020   Genetic testing 12/06/2020   Port-A-Cath in place 12/03/2020   Family history of breast cancer 11/20/2020   Personal history of malignant melanoma 11/20/2020   Family history of melanoma 11/20/2020   Malignant neoplasm of upper-outer quadrant of right breast in female, estrogen receptor negative ([facility] [facility]) 11/12/2020   Vitamin D deficiency 06/02/2019   Hyperglycemia 06/02/2019   Blood pressure elevated without history of HTN 06/02/2019   Headache 12/16/2018   Urinary incontinence, urge 12/14/2017   Calcific bursitis of shoulder 07/10/2015   Skin lesion 06/24/2015   Nausea alone 04/27/2013   Melanoma of thigh ([place]) 10/06/2012   Anxiety 11/17/2011   Rosacea 07/08/2011   Right shoulder pain 07/08/2011   Knee effusion, left 08/18/2010   Encounter for well adult exam with abnormal findings 08/18/2010   Attention deficit disorder 03/13/2010   GERD 12/27/2008   Hyperlipidemia 02/23/2007   DEPRESSION 02/23/2007   ALLERGIC  RHINITIS 02/23/2007   FATIGUE 02/23/2007   ANEMIA 02/05/2007   Deviated nasal septum 02/05/2007  Patient will follow up at outpatient cancer rehab 3-4 weeks following surgery.  If the patient requires physical therapy at that time, a specific plan will be dictated and sent to the referring physician for approval. The patient was educated today on appropriate basic range of motion exercises to begin post operatively and the importance of attending the After Breast Cancer class following surgery.  Patient was educated today on lymphedema risk reduction  practices as it pertains to recommendations that will benefit the patient immediately following surgery.  She verbalized good understanding.     Claris Pong, PT 05/06/2021, 3:14 PM  [facility] @ [facility] [place], [place], [address] Phone: [phone]   Fax:  [phone]  Name: Robin Farley MRN: [id number] Date of Birth: 1966-03-28

## 2021-05-07 ENCOUNTER — Ambulatory Visit
Admission: RE | Admit: 2021-05-07 | Discharge: 2021-05-07 | Disposition: A | Discharge status: Home / Self Care | Payer: Managed Care, Other (non HMO) | Source: Ambulatory Visit | Attending: Surgery | Admitting: Surgery

## 2021-05-07 ENCOUNTER — Other Ambulatory Visit: Payer: Self-pay | Admitting: Surgery

## 2021-05-07 DIAGNOSIS — C50411 Malignant neoplasm of upper-outer quadrant of right female breast: Secondary | ICD-10-CM

## 2021-05-07 DIAGNOSIS — Z171 Estrogen receptor negative status [ER-]: Secondary | ICD-10-CM

## 2021-05-08 ENCOUNTER — Ambulatory Visit
Admission: RE | Admit: 2021-05-08 | Discharge: 2021-05-08 | Disposition: A | Discharge status: Home / Self Care | Payer: Managed Care, Other (non HMO) | Source: Ambulatory Visit | Attending: Surgery | Admitting: Surgery

## 2021-05-08 ENCOUNTER — Ambulatory Visit (HOSPITAL_BASED_OUTPATIENT_CLINIC_OR_DEPARTMENT_OTHER): Payer: Managed Care, Other (non HMO) | Admitting: Anesthesiology

## 2021-05-08 ENCOUNTER — Other Ambulatory Visit: Payer: Self-pay

## 2021-05-08 ENCOUNTER — Encounter (HOSPITAL_BASED_OUTPATIENT_CLINIC_OR_DEPARTMENT_OTHER): Payer: Self-pay | Admitting: Surgery

## 2021-05-08 ENCOUNTER — Ambulatory Visit (HOSPITAL_COMMUNITY)
Admission: RE | Admit: 2021-05-08 | Discharge: 2021-05-08 | Disposition: A | Discharge status: Home / Self Care | Payer: Managed Care, Other (non HMO) | Source: Ambulatory Visit | Attending: Surgery | Admitting: Surgery

## 2021-05-08 ENCOUNTER — Observation Stay (HOSPITAL_COMMUNITY)
Admission: RE | Admit: 2021-05-08 | Discharge: 2021-05-09 | Disposition: A | Discharge status: Home / Self Care | Payer: Managed Care, Other (non HMO) | Attending: Plastic Surgery | Admitting: Plastic Surgery

## 2021-05-08 ENCOUNTER — Ambulatory Visit (INDEPENDENT_AMBULATORY_CARE_PROVIDER_SITE_OTHER): Payer: Managed Care, Other (non HMO) | Admitting: Plastic Surgery

## 2021-05-08 ENCOUNTER — Encounter (HOSPITAL_BASED_OUTPATIENT_CLINIC_OR_DEPARTMENT_OTHER)
Admission: RE | Disposition: A | Discharge status: Home / Self Care | Payer: Self-pay | Source: Home / Self Care | Attending: Plastic Surgery

## 2021-05-08 DIAGNOSIS — C50411 Malignant neoplasm of upper-outer quadrant of right female breast: Secondary | ICD-10-CM

## 2021-05-08 DIAGNOSIS — Z171 Estrogen receptor negative status [ER-]: Secondary | ICD-10-CM

## 2021-05-08 DIAGNOSIS — I10 Essential (primary) hypertension: Secondary | ICD-10-CM | POA: Insufficient documentation

## 2021-05-08 DIAGNOSIS — Z170 Estrogen receptor positive status [ER+]: Secondary | ICD-10-CM | POA: Diagnosis not present

## 2021-05-08 DIAGNOSIS — Z803 Family history of malignant neoplasm of breast: Secondary | ICD-10-CM | POA: Diagnosis not present

## 2021-05-08 DIAGNOSIS — Z85820 Personal history of malignant melanoma of skin: Secondary | ICD-10-CM | POA: Insufficient documentation

## 2021-05-08 DIAGNOSIS — R739 Hyperglycemia, unspecified: Secondary | ICD-10-CM | POA: Insufficient documentation

## 2021-05-08 DIAGNOSIS — C50919 Malignant neoplasm of unspecified site of unspecified female breast: Secondary | ICD-10-CM | POA: Diagnosis present

## 2021-05-08 HISTORY — PX: RADIOACTIVE SEED GUIDED AXILLARY SENTINEL LYMPH NODE: SHX6735

## 2021-05-08 HISTORY — PX: BREAST RECONSTRUCTION WITH PLACEMENT OF TISSUE EXPANDER AND FLEX HD (ACELLULAR HYDRATED DERMIS): SHX6295

## 2021-05-08 HISTORY — PX: MASTECTOMY W/ SENTINEL NODE BIOPSY: SHX2001

## 2021-05-08 LAB — GLUCOSE, CAPILLARY
Glucose-Capillary: 85 mg/dL (ref 70–99)
Glucose-Capillary: 94 mg/dL (ref 70–99)
Glucose-Capillary: 144 mg/dL — ABNORMAL HIGH (ref 70–99)

## 2021-05-08 SURGERY — MASTECTOMY WITH SENTINEL LYMPH NODE BIOPSY
Anesthesia: General | Site: Breast | Laterality: Right

## 2021-05-08 MED ORDER — FENTANYL CITRATE (PF) 100 MCG/2ML IJ SOLN
INTRAMUSCULAR | Status: DC | PRN
  Administered 2021-05-08 (×2): 25 ug via INTRAVENOUS
  Administered 2021-05-08: 50 ug via INTRAVENOUS
  Administered 2021-05-08: 25 ug via INTRAVENOUS
  Administered 2021-05-08: 50 ug via INTRAVENOUS

## 2021-05-08 MED ORDER — MORPHINE SULFATE (PF) 4 MG/ML IV SOLN: 1.0000 mg | INTRAVENOUS | Status: DC | PRN

## 2021-05-08 MED ORDER — OXYCODONE HCL 5 MG PO TABS: 5.0000 mg | ORAL_TABLET | Freq: Once | ORAL | Status: DC | PRN

## 2021-05-08 MED ORDER — POLYETHYLENE GLYCOL 3350 17 G PO PACK: 17.0000 g | PACK | Freq: Every day | ORAL | Status: DC | PRN

## 2021-05-08 MED ORDER — MAGTRACE LYMPHATIC TRACER
INTRAMUSCULAR | Status: DC | PRN
  Administered 2021-05-08: 2 mL via INTRAMUSCULAR

## 2021-05-08 MED ORDER — CHLORHEXIDINE GLUCONATE CLOTH 2 % EX PADS: 6.0000 | MEDICATED_PAD | Freq: Once | CUTANEOUS | Status: DC

## 2021-05-08 MED ORDER — ACETAMINOPHEN 500 MG PO TABS
1000.0000 mg | ORAL_TABLET | ORAL | Status: AC
Start: 2021-05-08 — End: 2021-05-08
  Administered 2021-05-08: 1000 mg via ORAL

## 2021-05-08 MED ORDER — ONDANSETRON HCL 4 MG/2ML IJ SOLN: 4.0000 mg | Freq: Once | INTRAMUSCULAR | Status: DC | PRN

## 2021-05-08 MED ORDER — TECHNETIUM TC 99M TILMANOCEPT KIT
1.0000 | PACK | Freq: Once | INTRAVENOUS | Status: AC | PRN
  Administered 2021-05-08: 1 via INTRADERMAL

## 2021-05-08 MED ORDER — LACTATED RINGERS IV SOLN
INTRAVENOUS | Status: DC
  Administered 2021-05-08: 10 mL/h via INTRAVENOUS

## 2021-05-08 MED ORDER — FENTANYL CITRATE (PF) 100 MCG/2ML IJ SOLN
INTRAMUSCULAR | Status: AC
  Filled 2021-05-08: qty 2

## 2021-05-08 MED ORDER — FENTANYL CITRATE (PF) 100 MCG/2ML IJ SOLN
100.0000 ug | Freq: Once | INTRAMUSCULAR | Status: AC
  Administered 2021-05-08: 50 ug via INTRAVENOUS

## 2021-05-08 MED ORDER — ONDANSETRON 4 MG PO TBDP
4.0000 mg | ORAL_TABLET | Freq: Four times a day (QID) | ORAL | Status: DC | PRN
  Administered 2021-05-09: 4 mg via ORAL
  Filled 2021-05-08: qty 1

## 2021-05-08 MED ORDER — METFORMIN HCL ER 500 MG PO TB24
500.0000 mg | ORAL_TABLET | Freq: Every day | ORAL | Status: DC
  Filled 2021-05-08 (×2): qty 1

## 2021-05-08 MED ORDER — MIDAZOLAM HCL 2 MG/2ML IJ SOLN
2.0000 mg | Freq: Once | INTRAMUSCULAR | Status: AC
  Administered 2021-05-08: 1 mg via INTRAVENOUS

## 2021-05-08 MED ORDER — DEXAMETHASONE SODIUM PHOSPHATE 10 MG/ML IJ SOLN
INTRAMUSCULAR | Status: DC | PRN
  Administered 2021-05-08: 4 mg via INTRAVENOUS

## 2021-05-08 MED ORDER — DIPHENHYDRAMINE HCL 25 MG PO CAPS: 25.0000 mg | ORAL_CAPSULE | Freq: Four times a day (QID) | ORAL | Status: DC | PRN

## 2021-05-08 MED ORDER — PROPOFOL 500 MG/50ML IV EMUL
INTRAVENOUS | Status: DC | PRN
  Administered 2021-05-08: 25 ug/kg/min via INTRAVENOUS

## 2021-05-08 MED ORDER — ATORVASTATIN CALCIUM 20 MG PO TABS: 20.0000 mg | ORAL_TABLET | Freq: Every day | ORAL | Status: DC

## 2021-05-08 MED ORDER — ACETAMINOPHEN 500 MG PO TABS
ORAL_TABLET | ORAL | Status: AC
  Filled 2021-05-08: qty 1

## 2021-05-08 MED ORDER — ONDANSETRON HCL 4 MG/2ML IJ SOLN
INTRAMUSCULAR | Status: DC | PRN
Start: 2021-05-08 — End: 2021-05-08
  Administered 2021-05-08: 4 mg via INTRAVENOUS

## 2021-05-08 MED ORDER — OXYCODONE HCL 5 MG/5ML PO SOLN: 5.0000 mg | Freq: Once | ORAL | Status: DC | PRN

## 2021-05-08 MED ORDER — DIPHENHYDRAMINE HCL 50 MG/ML IJ SOLN: 25.0000 mg | Freq: Four times a day (QID) | INTRAMUSCULAR | Status: DC | PRN

## 2021-05-08 MED ORDER — SODIUM CHLORIDE 0.9 % IV SOLN
INTRAVENOUS | Status: AC
  Filled 2021-05-08: qty 10

## 2021-05-08 MED ORDER — PROPOFOL 10 MG/ML IV BOLUS
INTRAVENOUS | Status: AC
  Filled 2021-05-08: qty 20

## 2021-05-08 MED ORDER — ACETAMINOPHEN 325 MG PO TABS: 650.0000 mg | ORAL_TABLET | Freq: Four times a day (QID) | ORAL | Status: DC | PRN

## 2021-05-08 MED ORDER — PROPOFOL 10 MG/ML IV BOLUS
INTRAVENOUS | Status: DC | PRN
Start: 2021-05-08 — End: 2021-05-08
  Administered 2021-05-08: 120 mg via INTRAVENOUS
  Administered 2021-05-08: 50 mg via INTRAVENOUS

## 2021-05-08 MED ORDER — SODIUM CHLORIDE 0.9 % IV SOLN
INTRAVENOUS | Status: DC | PRN
  Administered 2021-05-08: 200 mL

## 2021-05-08 MED ORDER — HYDROCODONE-ACETAMINOPHEN 5-325 MG PO TABS
1.0000 | ORAL_TABLET | ORAL | Status: DC | PRN
  Administered 2021-05-08 – 2021-05-09 (×2): 2 via ORAL
  Filled 2021-05-08 (×2): qty 2

## 2021-05-08 MED ORDER — SCOPOLAMINE 1 MG/3DAYS TD PT72
MEDICATED_PATCH | TRANSDERMAL | Status: AC
  Filled 2021-05-08: qty 1

## 2021-05-08 MED ORDER — CEFAZOLIN SODIUM-DEXTROSE 2-4 GM/100ML-% IV SOLN
INTRAVENOUS | Status: AC
  Filled 2021-05-08: qty 100

## 2021-05-08 MED ORDER — ONDANSETRON HCL 4 MG/2ML IJ SOLN
INTRAMUSCULAR | Status: AC
  Filled 2021-05-08: qty 2

## 2021-05-08 MED ORDER — ACETAMINOPHEN 325 MG RE SUPP: 650.0000 mg | Freq: Four times a day (QID) | RECTAL | Status: DC | PRN

## 2021-05-08 MED ORDER — ONDANSETRON HCL 4 MG/2ML IJ SOLN
4.0000 mg | Freq: Four times a day (QID) | INTRAMUSCULAR | Status: DC | PRN
  Administered 2021-05-08: 4 mg via INTRAVENOUS
  Filled 2021-05-08: qty 2

## 2021-05-08 MED ORDER — BUPIVACAINE LIPOSOME 1.3 % IJ SUSP
INTRAMUSCULAR | Status: DC | PRN
  Administered 2021-05-08: 10 mL via PERINEURAL

## 2021-05-08 MED ORDER — FENTANYL CITRATE (PF) 100 MCG/2ML IJ SOLN: 25.0000 ug | INTRAMUSCULAR | Status: DC | PRN

## 2021-05-08 MED ORDER — BUPIVACAINE HCL (PF) 0.5 % IJ SOLN
INTRAMUSCULAR | Status: DC | PRN
  Administered 2021-05-08: 20 mL via PERINEURAL

## 2021-05-08 MED ORDER — INDOCYANINE GREEN 25 MG IV SOLR
INTRAVENOUS | Status: DC | PRN
  Administered 2021-05-08: 5 mg via INTRAVENOUS

## 2021-05-08 MED ORDER — CEFAZOLIN SODIUM-DEXTROSE 2-4 GM/100ML-% IV SOLN
2.0000 g | INTRAVENOUS | Status: AC
  Administered 2021-05-08: 2 g via INTRAVENOUS

## 2021-05-08 MED ORDER — LIDOCAINE HCL (CARDIAC) PF 100 MG/5ML IV SOSY
PREFILLED_SYRINGE | INTRAVENOUS | Status: DC | PRN
  Administered 2021-05-08: 60 mg via INTRATRACHEAL

## 2021-05-08 MED ORDER — SULFAMETHOXAZOLE-TRIMETHOPRIM 800-160 MG PO TABS
1.0000 | ORAL_TABLET | Freq: Two times a day (BID) | ORAL | Status: DC
  Filled 2021-05-08: qty 1

## 2021-05-08 MED ORDER — SCOPOLAMINE 1 MG/3DAYS TD PT72
1.0000 | MEDICATED_PATCH | TRANSDERMAL | Status: DC
  Administered 2021-05-08: 1.5 mg via TRANSDERMAL

## 2021-05-08 MED ORDER — MIDAZOLAM HCL 2 MG/2ML IJ SOLN
INTRAMUSCULAR | Status: AC
  Filled 2021-05-08: qty 2

## 2021-05-08 MED ORDER — METHOCARBAMOL 500 MG PO TABS
500.0000 mg | ORAL_TABLET | Freq: Four times a day (QID) | ORAL | Status: DC | PRN
  Administered 2021-05-08 – 2021-05-09 (×2): 500 mg via ORAL
  Filled 2021-05-08 (×2): qty 1

## 2021-05-08 SURGICAL SUPPLY — 91 items
APPLIER CLIP 9.375 MED OPEN (MISCELLANEOUS) ×2
BAG DECANTER FOR FLEXI CONT (MISCELLANEOUS) ×2 IMPLANT
BENZOIN TINCTURE PRP APPL 2/3 (GAUZE/BANDAGES/DRESSINGS) ×2 IMPLANT
BIOPATCH RED 1 DISK 7.0 (GAUZE/BANDAGES/DRESSINGS) ×2 IMPLANT
BLADE CLIPPER SURG (BLADE) IMPLANT
BLADE HEX COATED 2.75 (ELECTRODE) ×1 IMPLANT
BLADE SURG 10 STRL SS (BLADE) ×2 IMPLANT
BLADE SURG 15 STRL LF DISP TIS (BLADE) ×1 IMPLANT
BLADE SURG 15 STRL SS (BLADE) ×2
BNDG ELASTIC 6X10 VLCR STRL LF (GAUZE/BANDAGES/DRESSINGS) ×2 IMPLANT
CANISTER SUCT 1200ML W/VALVE (MISCELLANEOUS) ×2 IMPLANT
CHLORAPREP W/TINT 26 (MISCELLANEOUS) ×4 IMPLANT
CLIP APPLIE 9.375 MED OPEN (MISCELLANEOUS) IMPLANT
COVER BACK TABLE 60X90IN (DRAPES) ×2 IMPLANT
COVER MAYO STAND STRL (DRAPES) ×2 IMPLANT
COVER PROBE W GEL 5X96 (DRAPES) ×3 IMPLANT
DECANTER SPIKE VIAL GLASS SM (MISCELLANEOUS) ×1 IMPLANT
DRAIN CHANNEL 15F RND FF W/TCR (WOUND CARE) ×2 IMPLANT
DRAIN CHANNEL 19F RND (DRAIN) ×1 IMPLANT
DRAIN HEMOVAC 1/8 X 5 (WOUND CARE) IMPLANT
DRAPE LAPAROSCOPIC ABDOMINAL (DRAPES) ×2 IMPLANT
DRAPE TOP ARMCOVERS (MISCELLANEOUS) ×1 IMPLANT
DRAPE UTILITY XL STRL (DRAPES) ×3 IMPLANT
DRSG PAD ABDOMINAL 8X10 ST (GAUZE/BANDAGES/DRESSINGS) ×4 IMPLANT
DRSG TEGADERM 2-3/8X2-3/4 SM (GAUZE/BANDAGES/DRESSINGS) ×2 IMPLANT
DRSG TEGADERM 4X4.75 (GAUZE/BANDAGES/DRESSINGS) ×1 IMPLANT
ELECT BLADE 6.5 EXT (BLADE) ×1 IMPLANT
ELECT COATED BLADE 2.86 ST (ELECTRODE) IMPLANT
ELECT REM PT RETURN 9FT ADLT (ELECTROSURGICAL) ×2
ELECTRODE REM PT RTRN 9FT ADLT (ELECTROSURGICAL) ×1 IMPLANT
EVACUATOR SILICONE 100CC (DRAIN) ×2 IMPLANT
FUNNEL KELLER 2 DISP (MISCELLANEOUS) IMPLANT
GAUZE SPONGE 4X4 12PLY STRL (GAUZE/BANDAGES/DRESSINGS) ×2 IMPLANT
GAUZE SPONGE 4X4 12PLY STRL LF (GAUZE/BANDAGES/DRESSINGS) IMPLANT
GLOVE SRG 8 PF TXTR STRL LF DI (GLOVE) IMPLANT
GLOVE SURG ENC MOIS LTX SZ7 (GLOVE) ×3 IMPLANT
GLOVE SURG ENC TEXT LTX SZ7.5 (GLOVE) ×4 IMPLANT
GLOVE SURG UNDER POLY LF SZ7 (GLOVE) ×3 IMPLANT
GLOVE SURG UNDER POLY LF SZ7.5 (GLOVE) ×3 IMPLANT
GLOVE SURG UNDER POLY LF SZ8 (GLOVE)
GOWN STRL REUS W/ TWL LRG LVL3 (GOWN DISPOSABLE) ×3 IMPLANT
GOWN STRL REUS W/TWL LRG LVL3 (GOWN DISPOSABLE) ×10
GRAFT FLEX HD 19X22X0.7-1.4 (Tissue) ×1 IMPLANT
ILLUMINATOR WAVEGUIDE N/F (MISCELLANEOUS) IMPLANT
IMPL EXPANDER BREAST 375CC (Breast) IMPLANT
IMPLANT BREAST 375CC (Breast) ×1 IMPLANT
IMPLANT EXPANDER BREAST 375CC (Breast) ×1 IMPLANT
IV NS 500ML (IV SOLUTION) ×2
IV NS 500ML BAXH (IV SOLUTION) IMPLANT
KIT FILL SYSTEM UNIVERSAL (SET/KITS/TRAYS/PACK) ×1 IMPLANT
MARKER SKIN DUAL TIP RULER LAB (MISCELLANEOUS) IMPLANT
NDL HYPO 25X1 1.5 SAFETY (NEEDLE) ×2 IMPLANT
NDL SAFETY ECLIPSE 18X1.5 (NEEDLE) ×1 IMPLANT
NEEDLE HYPO 18GX1.5 SHARP (NEEDLE)
NEEDLE HYPO 25X1 1.5 SAFETY (NEEDLE) ×4 IMPLANT
NS IRRIG 1000ML POUR BTL (IV SOLUTION) IMPLANT
PACK BASIN DAY SURGERY FS (CUSTOM PROCEDURE TRAY) ×2 IMPLANT
PACK SPY-PHI (KITS) ×1 IMPLANT
PENCIL SMOKE EVACUATOR (MISCELLANEOUS) ×2 IMPLANT
PIN SAFETY STERILE (MISCELLANEOUS) ×2 IMPLANT
RETRACTOR ONETRAX LX 135X30 (MISCELLANEOUS) IMPLANT
SHEET MEDIUM DRAPE 40X70 STRL (DRAPES) ×2 IMPLANT
SLEEVE SCD COMPRESS KNEE MED (STOCKING) ×2 IMPLANT
SPONGE T-LAP 18X18 ~~LOC~~+RFID (SPONGE) ×4 IMPLANT
SPONGE T-LAP 4X18 ~~LOC~~+RFID (SPONGE) IMPLANT
STAPLER INSORB 30 2030 C-SECTI (MISCELLANEOUS) ×2 IMPLANT
STAPLER VISISTAT 35W (STAPLE) ×2 IMPLANT
STRIP CLOSURE SKIN 1/2X4 (GAUZE/BANDAGES/DRESSINGS) ×4 IMPLANT
SUT ETHILON 2 0 FS 18 (SUTURE) ×1 IMPLANT
SUT ETHILON 3 0 PS 1 (SUTURE) ×1 IMPLANT
SUT MON AB 3-0 SH 27 (SUTURE)
SUT MON AB 3-0 SH27 (SUTURE) IMPLANT
SUT MON AB 4-0 PC3 18 (SUTURE) ×1 IMPLANT
SUT PDS 3-0 CT2 (SUTURE) ×8
SUT PDS II 3-0 CT2 27 ABS (SUTURE) ×3 IMPLANT
SUT PLAIN 5 0 P 3 18 (SUTURE) IMPLANT
SUT SILK 2 0 SH (SUTURE) ×2 IMPLANT
SUT VICRYL 3-0 CR8 SH (SUTURE) ×1 IMPLANT
SUT VLOC 180 0 24IN GS25 (SUTURE) IMPLANT
SUT VLOC 180 0 6IN GS21 (SUTURE) ×1 IMPLANT
SUT VLOC 90 P-14 23 (SUTURE) ×2 IMPLANT
SYR BULB IRRIG 60ML STRL (SYRINGE) ×2 IMPLANT
SYR CONTROL 10ML LL (SYRINGE) ×2 IMPLANT
TAPE MEASURE VINYL STERILE (MISCELLANEOUS) IMPLANT
TOWEL GREEN STERILE FF (TOWEL DISPOSABLE) ×4 IMPLANT
TRACER MAGTRACE VIAL (MISCELLANEOUS) ×1 IMPLANT
TRAY FAXITRON CT DISP (TRAY / TRAY PROCEDURE) ×1 IMPLANT
TRAY FOLEY W/BAG SLVR 14FR LF (SET/KITS/TRAYS/PACK) IMPLANT
TUBE CONNECTING 20X1/4 (TUBING) ×2 IMPLANT
UNDERPAD 30X36 HEAVY ABSORB (UNDERPADS AND DIAPERS) ×4 IMPLANT
YANKAUER SUCT BULB TIP NO VENT (SUCTIONS) ×2 IMPLANT

## 2021-05-08 NOTE — Progress Notes (Signed)
Dr. Georgette Dover and Waunita Schooner from [facility] at bedside for injections in right breast.  Pt tolerated both procedures well, VSS.

## 2021-05-08 NOTE — Transfer of Care (Signed)
Immediate Anesthesia Transfer of Care Note  Patient: Robin Farley  Procedure(s) Performed: RIGHT MASTECTOMY WITH SENTINEL LYMPH NODE BIOPSY (Right: Breast) RADIOACTIVE SEED GUIDED AXILLARY SENTINEL LYMPH NODE DISSECTION (Right) BREAST RECONSTRUCTION WITH PLACEMENT OF TISSUE EXPANDER AND FLEX HD (ACELLULAR HYDRATED DERMIS) (Right)  Patient Location: PACU  Anesthesia Type:General  Level of Consciousness: drowsy and patient cooperative  Airway & Oxygen Therapy: Patient Spontanous Breathing and Patient connected to face mask oxygen  Post-op Assessment: Report given to RN and Post -op Vital signs reviewed and stable  Post vital signs: Reviewed and stable  Last Vitals:  Vitals Value Taken Time  BP    Temp    Pulse 71 05/08/21 1553  Resp 12 05/08/21 1553  SpO2 100 % 05/08/21 1553  Vitals shown include unvalidated device data.  Last Pain:  Vitals:   05/08/21 1128  TempSrc: Oral  PainSc: 0-No pain         Complications: No notable events documented.

## 2021-05-08 NOTE — Progress Notes (Signed)
Patient presents for marking preoperatively for upcoming mastectomy with reconstruction.  I drew out an ellipse including the right areola.  She will now head over to the surgery center for preoperative process.  All of her questions were answered.

## 2021-05-08 NOTE — Anesthesia Procedure Notes (Signed)
Anesthesia Regional Block: Pectoralis block   Pre-Anesthetic Checklist: , timeout performed,  Correct Patient, Correct Site, Correct Laterality,  Correct Procedure, Correct Position, site marked,  Risks and benefits discussed,  Surgical consent,  Pre-op evaluation,  At surgeon's request and post-op pain management  Laterality: Right  Prep: chloraprep       Needles:  Injection technique: Single-shot  Needle Type: Echogenic Stimulator Needle     Needle Length: 10cm  Needle Gauge: 21   Needle insertion depth: 7 cm   Additional Needles:   Procedures:,,,, ultrasound used (permanent image in chart),,    Narrative:  Start time: 05/08/2021 12:51 PM End time: 05/08/2021 12:56 PM Injection made incrementally with aspirations every 5 mL.  Performed by: Personally  Anesthesiologist: Josephine Igo, MD  Additional Notes: Timeout performed. Patient sedated. Relevant anatomy ID'd using [place]. Incremental 2-64ml injection of LA with frequent aspiration. Patient tolerated procedure well.    Right Pectoralis Block

## 2021-05-08 NOTE — Op Note (Signed)
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 Adonis Housekeeper.  The patient has had not had mammograms in the last 3 years.  In early 2022, she noticed some asymmetry and a possible mass in her right breast.  This was about 4 months prior to her presentation in 8/22.  She brought this to the attention of her gynecologist who obtained a mammogram followed by ultrasound and biopsy.  She had 2 separate masses noted in the right breast.  At 10:00 located 7 cm from the nipple there was a 1.9 x 1.1 x 1.7 cm mass.  At 9:00 there was a smaller mass that was subsequently biopsied and was benign.  Axillary ultrasound showed 2 enlarged lymph nodes.  1 of these was biopsied.  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  The axillary lymph node showed metastatic carcinoma.   MRI performed in August 2022 revealed no sign of left breast cancer.  However on the right, there was a wider area of non-mass enhancement.  Further biopsies were performed that revealed invasive ductal carcinoma.   Genetic testing was negative for any mutations.   She recently completed a course of neoadjuvant chemotherapy.  Plans are to continue immunotherapy after surgery.  She will likely also need chest radiation.   Recent MRI revealed a residual 6 cm area of non-mass enhancement.  The enlarged lymph node in the axilla has improved.  The tethering of the skin in the right upper outer quadrant has also resolved.  There is no definitive mass in the right upper outer quadrant.   The patient is accompanied today by her husband as well as  her 2 daughters.  The patient is requesting a right mastectomy.  She is going to have immediate reconstruction by Dr. Claudia Desanctis  Description of procedure: The patient was seen in the preop area where she underwent pectoralis block by anesthesia, radiotracer injection by radiology, and mag trace injection by me.  She was brought to the operating room and placed in the supine position on the operating room table.  After adequate level general anesthesia was obtained, her entire chest and both axilla were prepped with ChloraPrep and draped sterile fashion.  Her incision had previously been outlined by Dr. Claudia Desanctis.  I made the triangular incision around the nipple areolar complex.  We then raised skin flaps inferiorly to the inframammary crease, medially to the edge of the sternum, superiorly to the infraclavicular chest wall, and laterally to the anterior edge of the latissimus muscle.  I then dissected the breast tissue off of the underlying pectoralis.  We took the anterior sheath of the pectoralis fascia with the specimen.  We continued working laterally until we near the axilla.  I then interrogated the axilla with the neoprobe on radioactive seed settings.  We identified an area of radioactivity in the lower axilla.  There are several small palpable lymph nodes in this area.  I excised this cluster of lymph nodes which included the radioactive seed.  I switched the neoprobe to the sentinel lymph node settings.  There also seems to be some activity within this cluster of lymph nodes.  I did  not detect any activity with the SentiMag probe.  We excised this cluster of lymph nodes.  Specimen mammogram showed the radioactive seed as well as a separate biopsy clip.  This biopsy clip marking the previously biopsied lymph node that contained metastases.  We completed our mastectomy.  There is some trace activity in the axillary tail with the neoprobe which may represent other lymph nodes.  The specimen was oriented with a long  suture lateral and a short suture superior.  This was sent for pathologic examination.  We inspected the wound carefully for hemostasis.  The wound was packed with saline moistened sponge.  We then turned the case over to Dr. Claudia Desanctis for reconstruction.  At the end of this portion of the case, all of her counts were correct.  Imogene Burn. Georgette Dover, MD, [facility] Surgery  General Surgery   05/08/2021 3:12 PM

## 2021-05-08 NOTE — Anesthesia Procedure Notes (Signed)
Procedure Name: LMA Insertion Date/Time: 05/08/2021 1:34 PM Performed by: Glory Buff, CRNA Pre-anesthesia Checklist: Patient identified, Emergency Drugs available, Suction available and Patient being monitored Patient Re-evaluated:Patient Re-evaluated prior to induction Oxygen Delivery Method: Circle system utilized Preoxygenation: Pre-oxygenation with 100% oxygen Induction Type: IV induction LMA: LMA inserted LMA Size: 4.0 Number of attempts: 1 Placement Confirmation: positive ETCO2 Tube secured with: Tape Dental Injury: Teeth and Oropharynx as per pre-operative assessment

## 2021-05-08 NOTE — Progress Notes (Signed)
Assisted Dr. Foster with right, ultrasound guided, pectoralis block. Side rails up, monitors on throughout procedure. See vital signs in flow sheet. Tolerated Procedure well. 

## 2021-05-08 NOTE — Op Note (Signed)
Operative Note   DATE OF OPERATION: 05/08/2021  SURGICAL DEPARTMENT: Plastic Surgery  PREOPERATIVE DIAGNOSES: Right breast cancer  POSTOPERATIVE DIAGNOSES:  same  PROCEDURE: 1.  Right breast reconstruction with tissue expander and acellular dermal matrix 2.  Indocyanine green angiography right mastectomy flap  SURGEON: Talmadge Coventry, MD  ASSISTANT: Verdie Shire, PA The advanced practice practitioner (APP) assisted throughout the case.  The APP was essential in retraction and counter traction when needed to make the case progress smoothly.  This retraction and assistance made it possible to see the tissue planes for the procedure.  The assistance was needed for hemostasis, tissue re-approximation and closure of the incision site.   ANESTHESIA:  General.   COMPLICATIONS: None.   INDICATIONS FOR PROCEDURE:  The patient, Robin Farley is a 56 y.o. female born on 05/23/1965, is here for treatment of right mastectomy defect MRN: [id number]  CONSENT:  Informed consent was obtained directly from the patient. Risks, benefits and alternatives were fully discussed. Specific risks including but not limited to bleeding, infection, hematoma, seroma, scarring, pain, contracture, asymmetry, wound healing problems, and need for further surgery were all discussed. The patient did have an ample opportunity to have questions answered to satisfaction.   DESCRIPTION OF PROCEDURE:  The patient was taken to the operating room. SCDs were placed and antibiotics were given.  General anesthesia was administered.  The patient's operative site was prepped and draped in a sterile fashion. A time out was performed and all information was confirmed to be correct.  Dr. Georgette Dover performed right sided mastectomy and node sampling and then turned the patient over to me.  I started by inspecting the mastectomy flaps.  Clinically they looked healthy.  Indocyanine green angiography was then performed showing excellent  perfusion of the entirety of the skin flaps.  I then proceeded to tack down the lateral soft tissues to the chest wall using a 0 V-Loc suture.  59 French drain was placed and secured with a nylon suture.  The acellular dermal matrix was then brought onto the field.  This was a Flex HD pliable pre-Peace.  A 3-0 PDS was then run around the periphery to act as a pursestring.  The expander was then brought onto the field.  This was a Media planner round high-profile expander with 375 cc prescribed fill volume.  The serial number was [id number].  All the air was removed from the expander.  It was placed within the matrix and the pursestring was tied down.  Suture tabs were brought out at 3, 6, and 9:00.  3-0 PDS sutures were placed into the chest wall at the corresponding locations.  The pocket was then irrigated with triple antibiotic solution.  The stay sutures were then run through the suture tabs and the expander was then placed into the wound and the stay sutures tied down.  50 cc was infiltrated into the expander.  Incision was then closed with buried Enzor staples and a running 3 OV lock.  Steri-Strips and a soft dressing were applied.  The patient tolerated the procedure well.  There were no complications. The patient was allowed to wake from anesthesia, extubated and taken to the recovery room in satisfactory condition.

## 2021-05-08 NOTE — Interval H&P Note (Signed)
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  The various methods of treatment have been discussed with the patient and family. After consideration of risks, benefits and other options for treatment, the patient has consented to  Procedure(s): RIGHT MASTECTOMY WITH SENTINEL LYMPH NODE BIOPSY (Right) RADIOACTIVE SEED GUIDED AXILLARY SENTINEL LYMPH NODE DISSECTION (Right) BREAST RECONSTRUCTION WITH PLACEMENT OF TISSUE EXPANDER AND FLEX HD (ACELLULAR HYDRATED DERMIS) (Right) as a surgical intervention.  The patient's history has been reviewed, patient examined, no change in status, stable for surgery.  I have reviewed the patient's chart and labs.  Questions were answered to the patient's satisfaction.     [name]

## 2021-05-08 NOTE — Anesthesia Preprocedure Evaluation (Addendum)
Anesthesia Evaluation  Patient identified by MRN, date of birth, ID band Patient awake    Reviewed: Allergy & Precautions, NPO status , Patient's Chart, lab work & pertinent test results  Airway Mallampati: II  TM Distance: >3 FB Neck ROM: Full    Dental no notable dental hx. (+) Teeth Intact, Dental Advisory Given   Pulmonary neg pulmonary ROS,    Pulmonary exam normal breath sounds clear to auscultation       Cardiovascular negative cardio ROS Normal cardiovascular exam Rhythm:Regular Rate:Normal     Neuro/Psych  Headaches, PSYCHIATRIC DISORDERS Anxiety Depression ADD   GI/Hepatic Neg liver ROS, GERD  Medicated and Controlled,  Endo/Other  diabetes, Well Controlled, Type 2, Oral Hypoglycemic AgentsHyperlipidemia Right Breast Ca  Renal/GU negative Renal ROS  negative genitourinary   Musculoskeletal negative musculoskeletal ROS (+)   Abdominal (+) - obese,   Peds  Hematology  (+) anemia ,   Anesthesia Other Findings   Reproductive/Obstetrics                             Anesthesia Physical Anesthesia Plan  ASA: 2  Anesthesia Plan: General   Post-op Pain Management: Regional block   Induction: Intravenous  PONV Risk Score and Plan: 4 or greater and Treatment may vary due to age or medical condition, Ondansetron, Midazolam, Dexamethasone and Scopolamine patch - Pre-op  Airway Management Planned: LMA  Additional Equipment: None  Intra-op Plan:   Post-operative Plan: Extubation in OR  Informed Consent: I have reviewed the patients History and Physical, chart, labs and discussed the procedure including the risks, benefits and alternatives for the proposed anesthesia with the patient or authorized representative who has indicated his/her understanding and acceptance.     Dental advisory given  Plan Discussed with: CRNA and Anesthesiologist  Anesthesia Plan Comments:         Anesthesia Quick Evaluation

## 2021-05-08 NOTE — Anesthesia Postprocedure Evaluation (Signed)
Anesthesia Post Note  Patient: Robin Farley  Procedure(s) Performed: RIGHT MASTECTOMY WITH SENTINEL LYMPH NODE BIOPSY (Right: Breast) RADIOACTIVE SEED GUIDED AXILLARY SENTINEL LYMPH NODE DISSECTION (Right: Breast) BREAST RECONSTRUCTION WITH PLACEMENT OF TISSUE EXPANDER AND FLEX HD (ACELLULAR HYDRATED DERMIS) (Right: Breast)     Patient location during evaluation: PACU Anesthesia Type: General Level of consciousness: awake and alert and oriented Pain management: pain level controlled Vital Signs Assessment: post-procedure vital signs reviewed and stable Respiratory status: spontaneous breathing, nonlabored ventilation and respiratory function stable Cardiovascular status: blood pressure returned to baseline and stable Postop Assessment: no apparent nausea or vomiting Anesthetic complications: no   No notable events documented.  Last Vitals:  Vitals:   05/08/21 1632 05/08/21 1645  BP:  128/72  Pulse: 79 75  Resp: 14 14  Temp:    SpO2: 97% 99%    Last Pain:  Vitals:   05/08/21 1632  TempSrc:   PainSc: 0-No pain                 Ariadna Setter A.

## 2021-05-09 ENCOUNTER — Encounter (HOSPITAL_BASED_OUTPATIENT_CLINIC_OR_DEPARTMENT_OTHER): Payer: Self-pay | Admitting: Surgery

## 2021-05-09 DIAGNOSIS — C50411 Malignant neoplasm of upper-outer quadrant of right female breast: Secondary | ICD-10-CM | POA: Diagnosis not present

## 2021-05-09 NOTE — Progress Notes (Signed)
Patient seen in [place] on POD #1  No complaints Pain well-controlled Skin flaps viable - no sign of hematoma  Discussed the findings at surgery.  Will call with pathology results.  Follow-up with me in 3-4 weeks.  Imogene Burn. Georgette Dover, MD, [facility]  General Surgery   05/09/2021 8:53 AM

## 2021-05-09 NOTE — Discharge Instructions (Addendum)
Activity: Avoid strenuous activity.  No heavy lifting.  Diet: No restrictions.  Try to optimize nutrition with plenty of fruits and vegetables to improve healing.  Wound Care: Leave ACE wrap for 5 days.  You may then remove and shower normally.  Sponge bath in interim.  After ACE wrap comes off recommend sports bra for gentle compression.  Avoid any bra with under-wire until cleared by Dr. Claudia Desanctis.  Follow-Up: Scheduled for next week.  Things to watch for:  Call the office if you experience fever, chills, persistent nausea, or significant bleeding.  Mild wound drainage is common after breast reduction surgery and should not be cause for alarm.    Information for Discharge Teaching: EXPAREL (bupivacaine liposome injectable suspension)   Your surgeon or anesthesiologist gave you EXPAREL(bupivacaine) to help control your pain after surgery.  EXPAREL is a local anesthetic that provides pain relief by numbing the tissue around the surgical site. EXPAREL is designed to release pain medication over time and can control pain for up to 72 hours. Depending on how you respond to EXPAREL, you may require less pain medication during your recovery.  Possible side effects: Temporary loss of sensation or ability to move in the area where bupivacaine was injected. Nausea, vomiting, constipation Rarely, numbness and tingling in your mouth or lips, lightheadedness, or anxiety may occur. Call your doctor right away if you think you may be experiencing any of these sensations, or if you have other questions regarding possible side effects.  Follow all other discharge instructions given to you by your surgeon or nurse. Eat a healthy diet and drink plenty of water or other fluids.  If you return to the hospital for any reason within 96 hours following the administration of EXPAREL, it is important for health care providers to know that you have received this anesthetic. A teal colored band has been placed on your  arm with the date, time and amount of EXPAREL you have received in order to alert and inform your health care providers. Please leave this armband in place for the full 96 hours following administration, and then you may remove the band.    About my Jackson-Pratt Bulb Drain  What is a Jackson-Pratt bulb? A Jackson-Pratt is a soft, round device used to collect drainage. It is connected to a long, thin drainage catheter, which is held in place by one or two small stiches near your surgical incision site. When the bulb is squeezed, it forms a vacuum, forcing the drainage to empty into the bulb.  Emptying the Jackson-Pratt bulb- To empty the bulb: 1. Release the plug on the top of the bulb. 2. Pour the bulb's contents into a measuring container which your nurse will provide. 3. Record the time emptied and amount of drainage. Empty the drain(s) as often as your     doctor or nurse recommends.  Date                  Time                    Amount (Drain 1)                 Amount (Drain 2)  _____________________________________________________________________  _____________________________________________________________________  _____________________________________________________________________  _____________________________________________________________________  _____________________________________________________________________  _____________________________________________________________________  _____________________________________________________________________  _____________________________________________________________________  Squeezing the Jackson-Pratt Bulb- To squeeze the bulb: 1. Make sure the plug at the top of the bulb is open. 2. Squeeze the bulb tightly in your fist. You will hear  air squeezing from the bulb. 3. Replace the plug while the bulb is squeezed. 4. Use a safety pin to attach the bulb to your clothing. This will keep the catheter from     pulling  at the bulb insertion site.  When to call your doctor- Call your doctor if: Drain site becomes red, swollen or hot. You have a fever greater than 101 degrees F. There is oozing at the drain site. Drain falls out (apply a guaze bandage over the drain hole and secure it with tape). Drainage increases daily not related to activity patterns. (You will usually have more drainage when you are active than when you are resting.) Drainage has a bad odor.

## 2021-05-09 NOTE — Discharge Summary (Signed)
Physician Discharge Summary  Patient ID: Robin Farley MRN: [id number] DOB/AGE: [birth year] 56 y.o.  Admit date: 05/08/2021 Discharge date: 05/09/2021  Admission Diagnoses:  Discharge Diagnoses:  Principal Problem:   Breast cancer [facility])   Discharged Condition: Patient is doing well, husband at bedside.  She has not required any analgesics overnight.  She did have postoperative nausea, but denies any emesis.  Patient reports that she feels improved and is able to tolerate p.o. intake without difficulty.  She has ambulated to the bathroom, voiding.  JP drain bulb has been emptied multiple times.  Hospital Course: Admitted for observation subsequent to right-sided mastectomy with immediate reconstruction using tissue expander and Flex HD.  Consults: None  Significant Diagnostic Studies: None  Treatments: surgery: Immediate right-sided breast reconstruction  Discharge Exam: Blood pressure 136/76, pulse 60, temperature 98.1 F (36.7 C), resp. rate 16, height 5\' 6"  (1.676 m), weight 63.3 kg, last menstrual period 04/27/2016, SpO2 98 %. General appearance: Alert, oriented, no acute distress.  Resting comfortably in bed. Chest wall: Steri-Strips intact over mastectomy incision.  No significant bruising.  No significant swelling or obvious subcutaneous fluid collection.  JP drain intact and functional, normal-appearing drainage in bulb. Extremities: SCDs in place.  Disposition: Discharge disposition: 01-Home or Self Care       Discharge Instructions     Diet - low sodium heart healthy   Complete by: As directed    Increase activity slowly   Complete by: As directed       Allergies as of 05/09/2021       Reactions   Promethazine Hcl    Legs twitch        Medication List     TAKE these medications    atorvastatin 20 MG tablet Commonly known as: Lipitor Take 1 tablet (20 mg total) by mouth daily.   fluconazole 200 MG tablet Commonly known as: DIFLUCAN Take 1  tablet (200 mg total) by mouth daily.   HYDROcodone-acetaminophen 5-325 MG tablet Commonly known as: Norco Take 1 tablet by mouth every 6 (six) hours as needed for up to 5 days for severe pain.   magic mouthwash (nystatin, lidocaine, diphenhydrAMINE, alum & mag hydroxide) suspension Swish and spit 5 mls 4 times a day as needed before meals and at bedtime   metFORMIN 500 MG 24 hr tablet Commonly known as: GLUCOPHAGE-XR Take 1 tablet (500 mg total) by mouth daily with breakfast.   ondansetron 4 MG disintegrating tablet Commonly known as: ZOFRAN-ODT Take 1 tablet (4 mg total) by mouth every 8 (eight) hours as needed for nausea or vomiting.   ondansetron 8 MG tablet Commonly known as: ZOFRAN Take 1 tablet (8 mg total) by mouth every 8 (eight) hours as needed for nausea or vomiting.   sulfamethoxazole-trimethoprim 800-160 MG tablet Commonly known as: BACTRIM DS Take 1 tablet by mouth 2 (two) times daily for 14 days.   valACYclovir 1000 MG tablet Commonly known as: VALTREX TAKE 1 TABLET BY MOUTH TWICE A DAY What changed: when to take this        Follow-up Information     Donnie Mesa, MD. Schedule an appointment as soon as possible for a visit in 3 week(s).   Specialty: General Surgery Contact information: [place] [phone] [facility] [address] [place], [place] [address] [phone]  Signed: Krista Blue 05/09/2021, 6:25 AM

## 2021-05-12 ENCOUNTER — Encounter: Payer: Self-pay | Admitting: *Deleted

## 2021-05-12 ENCOUNTER — Other Ambulatory Visit: Payer: Self-pay | Admitting: *Deleted

## 2021-05-12 DIAGNOSIS — Z171 Estrogen receptor negative status [ER-]: Secondary | ICD-10-CM

## 2021-05-12 DIAGNOSIS — C50411 Malignant neoplasm of upper-outer quadrant of right female breast: Secondary | ICD-10-CM

## 2021-05-12 MED ORDER — FLUCONAZOLE 200 MG PO TABS: 200.0000 mg | ORAL_TABLET | Freq: Every day | ORAL | 1 refills | Status: DC

## 2021-05-14 ENCOUNTER — Ambulatory Visit: Payer: Managed Care, Other (non HMO) | Admitting: Hematology and Oncology

## 2021-05-14 ENCOUNTER — Telehealth: Payer: Self-pay | Admitting: *Deleted

## 2021-05-14 ENCOUNTER — Encounter: Payer: Self-pay | Admitting: *Deleted

## 2021-05-14 ENCOUNTER — Other Ambulatory Visit: Payer: Self-pay | Admitting: *Deleted

## 2021-05-14 DIAGNOSIS — Z171 Estrogen receptor negative status [ER-]: Secondary | ICD-10-CM

## 2021-05-14 DIAGNOSIS — C50411 Malignant neoplasm of upper-outer quadrant of right female breast: Secondary | ICD-10-CM

## 2021-05-14 NOTE — Telephone Encounter (Signed)
Contacted pathology via email to request repeat prognostic panel for this patient on her final pathology. Case number given.

## 2021-05-15 ENCOUNTER — Other Ambulatory Visit: Payer: Self-pay

## 2021-05-15 ENCOUNTER — Ambulatory Visit (INDEPENDENT_AMBULATORY_CARE_PROVIDER_SITE_OTHER): Payer: Managed Care, Other (non HMO) | Admitting: Plastic Surgery

## 2021-05-15 DIAGNOSIS — Z171 Estrogen receptor negative status [ER-]: Secondary | ICD-10-CM

## 2021-05-15 DIAGNOSIS — C50411 Malignant neoplasm of upper-outer quadrant of right female breast: Secondary | ICD-10-CM

## 2021-05-15 NOTE — Progress Notes (Signed)
Patient presents 1 week postop from right mastectomy with tissue expander reconstruction.  She feels like things are going well.  Her drain is putting out 15 to 20 cc/day.  On exam her skin looks healthy and everything looks to be going smoothly.  No signs of subcutaneous fluid collections.  We will plan to see her next week and hopefully remove the drain and then start the expansion process.  All of her questions were answered.

## 2021-05-19 ENCOUNTER — Other Ambulatory Visit: Payer: Self-pay | Admitting: Hematology and Oncology

## 2021-05-19 DIAGNOSIS — Z171 Estrogen receptor negative status [ER-]: Secondary | ICD-10-CM

## 2021-05-19 DIAGNOSIS — C50411 Malignant neoplasm of upper-outer quadrant of right female breast: Secondary | ICD-10-CM

## 2021-05-20 ENCOUNTER — Other Ambulatory Visit (HOSPITAL_COMMUNITY): Payer: Self-pay

## 2021-05-20 ENCOUNTER — Other Ambulatory Visit: Payer: Managed Care, Other (non HMO)

## 2021-05-20 ENCOUNTER — Inpatient Hospital Stay: Payer: Managed Care, Other (non HMO) | Attending: Oncology

## 2021-05-20 ENCOUNTER — Encounter: Payer: Self-pay | Admitting: Hematology and Oncology

## 2021-05-20 ENCOUNTER — Inpatient Hospital Stay: Payer: Managed Care, Other (non HMO)

## 2021-05-20 ENCOUNTER — Other Ambulatory Visit: Payer: Self-pay

## 2021-05-20 ENCOUNTER — Inpatient Hospital Stay: Payer: Managed Care, Other (non HMO) | Admitting: Hematology and Oncology

## 2021-05-20 ENCOUNTER — Encounter: Payer: Self-pay | Admitting: *Deleted

## 2021-05-20 ENCOUNTER — Ambulatory Visit: Payer: Managed Care, Other (non HMO)

## 2021-05-20 ENCOUNTER — Encounter: Payer: Self-pay | Admitting: Oncology

## 2021-05-20 VITALS — BP 121/82 | HR 100 | Temp 97.7°F | Resp 16 | Ht 66.0 in | Wt 137.2 lb

## 2021-05-20 DIAGNOSIS — Z5112 Encounter for antineoplastic immunotherapy: Secondary | ICD-10-CM

## 2021-05-20 DIAGNOSIS — Z171 Estrogen receptor negative status [ER-]: Secondary | ICD-10-CM

## 2021-05-20 DIAGNOSIS — Z7984 Long term (current) use of oral hypoglycemic drugs: Secondary | ICD-10-CM | POA: Insufficient documentation

## 2021-05-20 DIAGNOSIS — Z79899 Other long term (current) drug therapy: Secondary | ICD-10-CM | POA: Insufficient documentation

## 2021-05-20 DIAGNOSIS — M25511 Pain in right shoulder: Secondary | ICD-10-CM | POA: Insufficient documentation

## 2021-05-20 DIAGNOSIS — C50411 Malignant neoplasm of upper-outer quadrant of right female breast: Secondary | ICD-10-CM

## 2021-05-20 DIAGNOSIS — B379 Candidiasis, unspecified: Secondary | ICD-10-CM | POA: Diagnosis not present

## 2021-05-20 DIAGNOSIS — Z95828 Presence of other vascular implants and grafts: Secondary | ICD-10-CM

## 2021-05-20 DIAGNOSIS — Z9011 Acquired absence of right breast and nipple: Secondary | ICD-10-CM | POA: Insufficient documentation

## 2021-05-20 LAB — COMPREHENSIVE METABOLIC PANEL
ALT: 21 U/L (ref 0–44)
AST: 27 U/L (ref 15–41)
Albumin: 4.3 g/dL (ref 3.5–5.0)
Alkaline Phosphatase: 96 U/L (ref 38–126)
Anion gap: 10 (ref 5–15)
BUN: 11 mg/dL (ref 6–20)
CO2: 23 mmol/L (ref 22–32)
Calcium: 9.8 mg/dL (ref 8.9–10.3)
Chloride: 101 mmol/L (ref 98–111)
Creatinine, Ser: 0.86 mg/dL (ref 0.44–1.00)
GFR, Estimated: >60 mL/min (ref >60)
Glucose, Bld: 111 mg/dL — ABNORMAL HIGH (ref 70–99)
Potassium: 4 mmol/L (ref 3.5–5.1)
Sodium: 134 mmol/L — ABNORMAL LOW (ref 135–145)
Total Bilirubin: 0.3 mg/dL (ref 0.3–1.2)
Total Protein: 7.1 g/dL (ref 6.5–8.1)

## 2021-05-20 LAB — CBC WITH DIFFERENTIAL/PLATELET
Abs Immature Granulocytes: 0.01 10*3/uL (ref 0.00–0.07)
Basophils Absolute: 0 10*3/uL (ref 0.0–0.1)
Basophils Relative: 0 %
Eosinophils Absolute: 0 10*3/uL (ref 0.0–0.5)
Eosinophils Relative: 0 %
HCT: 31.7 % — ABNORMAL LOW (ref 36.0–46.0)
Hemoglobin: 10.9 g/dL — ABNORMAL LOW (ref 12.0–15.0)
Immature Granulocytes: 0 %
Lymphocytes Relative: 7 %
Lymphs Abs: 0.4 10*3/uL — ABNORMAL LOW (ref 0.7–4.0)
MCH: 34.3 pg — ABNORMAL HIGH (ref 26.0–34.0)
MCHC: 34.4 g/dL (ref 30.0–36.0)
MCV: 99.7 fL (ref 80.0–100.0)
Monocytes Absolute: 0.5 10*3/uL (ref 0.1–1.0)
Monocytes Relative: 9 %
Neutro Abs: 4.4 10*3/uL (ref 1.7–7.7)
Neutrophils Relative %: 84 %
Platelets: 260 10*3/uL (ref 150–400)
RBC: 3.18 MIL/uL — ABNORMAL LOW (ref 3.87–5.11)
RDW: 13.5 % (ref 11.5–15.5)
WBC: 5.3 10*3/uL (ref 4.0–10.5)
nRBC: 0 % (ref 0.0–0.2)

## 2021-05-20 LAB — TSH: TSH: 1.96 u[IU]/mL (ref 0.308–3.960)

## 2021-05-20 LAB — SURGICAL PATHOLOGY

## 2021-05-20 MED ORDER — SODIUM CHLORIDE 0.9% FLUSH
10.0000 mL | Freq: Once | INTRAVENOUS | Status: AC
  Administered 2021-05-20: 10 mL via INTRAVENOUS

## 2021-05-20 MED ORDER — SODIUM CHLORIDE 0.9% FLUSH
10.0000 mL | INTRAVENOUS | Status: DC | PRN
  Administered 2021-05-20: 10 mL

## 2021-05-20 MED ORDER — SODIUM CHLORIDE 0.9 % IV SOLN: Freq: Once | INTRAVENOUS | Status: AC

## 2021-05-20 MED ORDER — NYSTATIN 100000 UNIT/ML MT SUSP
OROMUCOSAL | 3 refills | Status: DC
  Filled 2021-05-20: qty 240, 12d supply, fill #0

## 2021-05-20 MED ORDER — SODIUM CHLORIDE 0.9 % IV SOLN
200.0000 mg | Freq: Once | INTRAVENOUS | Status: AC
  Administered 2021-05-20: 200 mg via INTRAVENOUS
  Filled 2021-05-20: qty 200

## 2021-05-20 MED ORDER — HEPARIN SOD (PORK) LOCK FLUSH 100 UNIT/ML IV SOLN
500.0000 [IU] | Freq: Once | INTRAVENOUS | Status: AC | PRN
  Administered 2021-05-20: 500 [IU]

## 2021-05-20 NOTE — Patient Instructions (Signed)
[facility]COL[facility]   ?Discharge Instructions: ?Thank you for choosing [facility] to provide your oncology and hematology care.  ? ?If you have a lab appointment with the Cancer Center, please go directly to the Cancer Center and check in at the registration area. ?  ?Wear comfortable clothing and clothing appropriate for easy access to any Portacath or PICC line.  ? ?We strive to give you quality time with your provider. You may need to reschedule your appointment if you arrive late (15 or more minutes).  Arriving late affects you and other patients whose appointments are after yours.  Also, if you miss three or more appointments without notifying the office, you may be dismissed from the clinic at the provider?s discretion.    ?  ?For prescription refill requests, have your pharmacy contact our office and allow 72 hours for refills to be completed.   ? ?Today you received the following chemotherapy and/or immunotherapy agents: pembrolizumab    ?  ?To help prevent nausea and vomiting after your treatment, we encourage you to take your nausea medication as directed. ? ?BELOW ARE SYMPTOMS THAT SHOULD BE REPORTED IMMEDIATELY: ?*FEVER GREATER THAN 100.4 F (38 ?C) OR HIGHER ?*CHILLS OR SWEATING ?*NAUSEA AND VOMITING THAT IS NOT CONTROLLED WITH YOUR NAUSEA MEDICATION ?*UNUSUAL SHORTNESS OF BREATH ?*UNUSUAL BRUISING OR BLEEDING ?*URINARY PROBLEMS (pain or burning when urinating, or frequent urination) ?*BOWEL PROBLEMS (unusual diarrhea, constipation, pain near the anus) ?TENDERNESS IN MOUTH AND THROAT WITH OR WITHOUT PRESENCE OF ULCERS (sore throat, sores in mouth, or a toothache) ?UNUSUAL RASH, SWELLING OR PAIN  ?UNUSUAL VAGINAL DISCHARGE OR ITCHING  ? ?Items with * indicate a potential emergency and should be followed up as soon as possible or go to the Emergency Department if any problems should occur. ? ?Please show the CHEMOTHERAPY ALERT CARD or IMMUNOTHERAPY ALERT CARD at  check-in to the Emergency Department and triage nurse. ? ?Should you have questions after your visit or need to cancel or reschedule your appointment, please contact [facility]  Dept: [phone]  and follow the prompts.  Office hours are 8:00 a.m. to 4:30 p.m. Monday - Friday. Please note that voicemails left after 4:00 p.m. may not be returned until the following business day.  We are closed weekends and major holidays. You have access to a nurse at all times for urgent questions. Please call the main number to the clinic Dept: [phone] and follow the prompts. ? ? ?For any non-urgent questions, you may also contact your provider using MyChart. We now offer e-Visits for anyone 18 and older to request care online for non-urgent symptoms. For details visit [email or web address]. ?  ?Also download the MyChart app! Go to the app store, search "MyChart", open the app, select , and log in with your MyChart username and password. ? ?Due to Covid, a mask is required upon entering the hospital/clinic. If you do not have a mask, one will be given to you upon arrival. For doctor visits, patients may have 1 support person aged 18 or older with them. For treatment visits, patients cannot have anyone with them due to current Covid guidelines and our immunocompromised population.  ? ?

## 2021-05-20 NOTE — Progress Notes (Signed)
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  She is accompanied by her husband today.  She is post right mastectomy and targeted lymph node dissection and is working with plastic surgery. She is continuing to heal, has a lot of pain around the right shoulder and the right arm pit especially with movement, dealing with ongoing thrush, not eating well according to husband. She denies any changes in bowel habits or breathing.  She is tolerating immunotherapy very well.  We are following her TSH since she is receiving Pembrolizumab, levels are noted below.  Lab Results  Component Value Date   TSH 3.095 04/29/2021   TSH 1.548 04/22/2021   TSH 2.002 04/08/2021   TSH 2.604 03/18/2021   TSH 1.498 02/25/2021    REVIEW OF SYSTEMS:  Rest of the pertinent 10 point ROS reviewed and negative except as mentioned above  COVID 19 VACCINATION STATUS: [place] x2, most recently 11/2019; infection 07/2020   HISTORY OF CURRENT ILLNESS:   Cancer Staging  Malignant neoplasm of upper-outer quadrant of right breast in female, estrogen receptor negative ([place]) Staging form: Breast, AJCC 8th Edition - Clinical: Stage IIB (cT1c, cN1, cM0, G3, ER-, PR-, HER2-) - Signed by [name], MD on  11/20/2020 Histologic grading system: 3 grade system  56 y.o. [place] woman status post right breast upper outer quadrant biopsy 11/08/2020 for a clinical T1c N1, stage IIB invasive ductal carcinoma, grade 3, functionally triple negative, with an MIB-1 of 30%  (a) right breast retroareolar biopsy 12/09/2020 shows invasive ductal carcinoma, grade 2  (1) genetics test 12/03/2020 through the [place] +RNAinsight Panel found no deleterious mutations in AIP, ALK, APC, ATM, AXIN2, BAP1, BARD1, BLM, BMPR1A, BRCA1, BRCA2, BRIP1, CDC73, CDH1, CDK4, CDKN1B, CDKN2A, CHEK2, CTNNA1, DICER1, FANCC, FH, FLCN, GALNT12, KIF1B, LZTR1, MAX, MEN1, MET, MLH1, MSH2, MSH3, MSH6, MUTYH, NBN, NF1, NF2, NTHL1, PALB2, PHOX2B, PMS2, POT1, PRKAR1A, PTCH1, PTEN, RAD51C, RAD51D, RB1, RECQL, RET, SDHA, SDHAF2, SDHB, SDHC, SDHD, SMAD4, SMARCA4, SMARCB1, SMARCE1, STK11, SUFU, TMEM127, TP53, TSC1, TSC2, VHL and XRCC2 (sequencing and deletion/duplication); EGFR, EGLN1, HOXB13, KIT, MITF, PDGFRA, POLD1, and POLE (sequencing only); EPCAM and GREM1 (deletion/duplication only).    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  Pathology showed 2.3 x 1.7 x 1.0 cm invasive ductal carcinoma, grade 3 of 3, treatment effect was robust in the breast and minimal in lymph nodes.  All surgical margins negative for invasive carcinoma, out of 4 examined the lymph nodes, 2 with micrometastasis and 0 with micrometastasis or isolated tumor cells.  Largest metastatic deposit in the lymph  node was 4 mm and extranodal extension was  present.  (4) adjuvant radiation to follow, she has an appointment with Dr. Isidore Moos on 06/03/2021  She is currently here for follow-up to consider adjuvant immunotherapy.  Since her last visit, she continues to heal from surgery.  She has a lot of pain in the right armpit and the right shoulder but is hesitant to take much pain medication.  Drain in place.  She continues to follow-up with plastic surgery.  Loss of appetite, ongoing thrush which messes up with the taste, she has been taking Diflucan which has not been helping much however has ran out of nystatin.  She wonders if the antibiotic she is on for postsurgical reasons is also interfering with healing from the thrush.  Rest of the pertinent 10 point ROS reviewed and negative.  PAST MEDICAL HISTORY: Past Medical History:  Diagnosis Date   Abdominal pain, generalized 12/27/2008   Acute sinusitis, unspecified 02/26/2007   ADD 03/13/2010   ALLERGIC RHINITIS 02/23/2007   ANEMIA 02/05/2007   Anxiety 11/17/2011   ATTENTION DEFICIT DISORDER, HX OF 02/05/2007   Breast cancer ([place])    Deviated nasal septum 02/05/2007   Diabetes mellitus without complication ([place])    Family history of breast cancer 11/20/2020   Family history of melanoma 11/20/2020   FATIGUE 02/23/2007   HYPERLIPIDEMIA 02/23/2007   Personal history of malignant melanoma 11/20/2020   SWELLING MASS OR LUMP IN HEAD AND NECK 07/27/2008   UTI 02/23/2007    PAST SURGICAL HISTORY: Past Surgical History:  Procedure Laterality Date   BREAST RECONSTRUCTION WITH PLACEMENT OF TISSUE EXPANDER AND FLEX HD (ACELLULAR HYDRATED DERMIS) Right 05/08/2021   Procedure: BREAST RECONSTRUCTION WITH PLACEMENT OF TISSUE EXPANDER AND FLEX HD (ACELLULAR HYDRATED DERMIS);  Surgeon: Cindra Presume, MD;  Location: [facility];  Service: Plastics;  Laterality: Right;   BROW LIFT     CESAREAN SECTION     COSMETIC SURGERY  1995   [place]   MASTECTOMY W/ SENTINEL NODE BIOPSY Right 05/08/2021    Procedure: RIGHT MASTECTOMY WITH SENTINEL LYMPH NODE BIOPSY;  Surgeon: Donnie Mesa, MD;  Location: [facility];  Service: General;  Laterality: Right;   MELANOMA EXCISION WITH SENTINEL LYMPH NODE BIOPSY Left 10/24/2012   Procedure: MELANOMA wide EXCISION left lateral thigh WITH SENTINEL LYMPH NODE BIOPSY left groin;  Surgeon: Edward Jolly, MD;  Location: [facility];  Service: General;  Laterality: Left;   NASAL SEPTUM SURGERY     PORTACATH PLACEMENT Right 12/02/2020   Procedure: INSERTION PORT-A-CATH;  Surgeon: Donnie Mesa, MD;  Location: [facility];  Service: General;  Laterality: Right;   RADIOACTIVE SEED GUIDED AXILLARY SENTINEL LYMPH NODE Right 05/08/2021   Procedure: RADIOACTIVE SEED GUIDED AXILLARY SENTINEL LYMPH NODE DISSECTION;  Surgeon: Donnie Mesa, MD;  Location: [facility];  Service: General;  Laterality: Right;    FAMILY HISTORY: Family History  Problem Relation Age of Onset   Hypertension Mother    Breast cancer Mother 28   COPD Father    Atrial fibrillation Father    Atrial fibrillation Sister    Kidney cancer Maternal Aunt        dx 49s   Leukemia Paternal Aunt        dx after 96   Cancer Paternal Aunt        unknown type; dx after 29   Brain cancer Paternal Grandfather 73       brain tumor per  pt   Melanoma Cousin        arm; no mets per pt   Alcohol abuse Other    Depression Other    Glaucoma Other    Breast cancer Other        MGM's sisters, x3, dx after 17   Colon polyps Neg Hx    Her father died at age 75 from COPD. Her mother is currently 57 years old as of 11/2020. Jase has no full siblings and three half-siblings-- two brothers and one sister. She reports breast cancer in her mother and three great-aunts, brain tumor in her paternal grandfather, and metastatic melanoma in a cousin.   GYNECOLOGIC HISTORY:  Patient's last menstrual period was 04/27/2016. Menarche: [birth year]56 years  old Age at first live birth: 56 years old GX P 2 LMP 2016 Contraceptive used for 1-2 years HRT estrogen cream irregularly Hysterectomy? no BSO? 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 She lives in [place], [place] but travels here frequently for work. Husband Trey Paula works in Airline pilot for [employer]. She lives at home with Trey Paula and their daughter Ellyn Hack, who is 19 and is a Consulting civil engineer at [employer]. Daughter Bevelyn Ngo, age 42, is a Administrator, Civil Service in [place]. [place], . Brittanie is not a Actor.    ADVANCED DIRECTIVES: In the absence of any documentation to the contrary, the patient's spouse is their HCPOA.    HEALTH MAINTENANCE: Social History   Tobacco Use   Smoking status: Never   Smokeless tobacco: Never  Substance Use Topics   Alcohol use: Yes    Comment: Very rare occasions   Drug use: Never     Colonoscopy: 12/2014 (Dr. Marina Goodell)  PAP: 2020  Bone density: unsure   Allergies  Allergen Reactions   Promethazine Hcl     Legs twitch    Current Outpatient Medications  Medication Sig Dispense Refill   atorvastatin (LIPITOR) 20 MG tablet Take 1 tablet (20 mg total) by mouth daily. 90 tablet 3   fluconazole (DIFLUCAN) 200 MG tablet Take 1 tablet (200 mg total) by mouth daily. 30 tablet 1   magic mouthwash (nystatin, lidocaine, diphenhydrAMINE, alum & mag hydroxide) suspension Swish and spit 5 mls 4 times a day as needed before meals and at bedtime 240 mL 3   metFORMIN (GLUCOPHAGE-XR) 500 MG 24 hr tablet Take 1 tablet (500 mg total) by mouth daily with breakfast. 90 tablet 3   ondansetron (ZOFRAN) 8 MG tablet Take 1 tablet (8 mg total) by mouth every 8 (eight) hours as needed for nausea or vomiting. 20 tablet 3   ondansetron (ZOFRAN-ODT) 4 MG disintegrating tablet Take 1 tablet (4 mg total) by mouth every 8 (eight) hours as needed for nausea or vomiting. 20 tablet 0   sulfamethoxazole-trimethoprim  (BACTRIM DS) 800-160 MG tablet Take 1 tablet by mouth 2 (two) times daily for 14 days. 28 tablet 0   valACYclovir (VALTREX) 1000 MG tablet TAKE 1 TABLET BY MOUTH TWICE A DAY (Patient taking differently: Take 1,000 mg by mouth daily.) 60 tablet 1   No current facility-administered medications for this visit.   Facility-Administered Medications Ordered in Other Visits  Medication Dose Route Frequency Provider Last Rate Last Admin   sodium chloride flush (NS) 0.9 % injection 10 mL  10 mL Intracatheter PRN Magrinat, Valentino Hue, MD   10 mL at 01/21/21 1144   sodium chloride flush (NS) 0.9 % injection 10 mL  10 mL Intracatheter PRN Magrinat, Valentino Hue, MD   10 mL at 03/20/21 1159    OBJECTIVE: White woman who appears younger than stated age  Vitals:   05/20/21 1130  BP: 121/82  Pulse: 100  Resp: 16  Temp: 97.7 F (36.5 C)  SpO2: 100%       Body mass index is 22.14 kg/m.   Wt Readings from Last 3 Encounters:  05/20/21 137 lb 3.2 oz (62.2 kg)  05/08/21 139 lb 8.8 oz (63.3 kg)  05/06/21 140 lb (63.5 kg)     ECOG FS:1 - Symptomatic but completely ambulatory  Sclerae unicteric, EOMs intact, appears tired Oropharynx is clear, no signs of thrush.  No mouth ulcers noted today. No cervical or supraclavicular adenopathy Lungs no rales or rhonchi Heart regular rate and rhythm MSK no focal spinal tenderness, no upper extremity lymphedema Neuro: nonfocal, well oriented, appropriate affect Breasts: Right breast postmastectomy, healing appropriately.  LAB RESULTS:  CMP     Component Value Date/Time   NA 139 05/05/2021 1715   K 4.4 05/05/2021 1715   CL 107 05/05/2021 1715   CO2 24 05/05/2021 1715   GLUCOSE 91 05/05/2021 1715   BUN 6 05/05/2021 1715   CREATININE 0.79 05/05/2021 1715   CREATININE 0.77 04/29/2021 0807   CALCIUM 9.2 05/05/2021 1715   PROT 6.4 (L) 04/29/2021 0807   ALBUMIN 3.7 04/29/2021 0807   AST 13 (L) 04/29/2021 0807   ALT 9 04/29/2021 0807   ALKPHOS 79 04/29/2021  0807   BILITOT 0.3 04/29/2021 0807   GFRNONAA >60 05/05/2021 1715   GFRNONAA >60 04/29/2021 0807   GFRAA 100 03/23/2008 1212    No results found for: TOTALPROTELP, ALBUMINELP, A1GS, A2GS, BETS, BETA2SER, GAMS, MSPIKE, SPEI  Lab Results  Component Value Date   WBC 5.3 05/20/2021   NEUTROABS 4.4 05/20/2021   HGB 10.9 (L) 05/20/2021   HCT 31.7 (L) 05/20/2021   MCV 99.7 05/20/2021   PLT 260 05/20/2021    No results found for: LABCA2  No components found for: AQNJXV951  No results for input(s): INR in the last 168 hours.  No results found for: LABCA2  No results found for: DZH565  No results found for: INC024  No results found for: ILQ985  No results found for: CA2729  No components found for: HGQUANT  No results found for: CEA1 / No results found for: CEA1   No results found for: AFPTUMOR  No results found for: CHROMOGRNA  No results found for: KPAFRELGTCHN, LAMBDASER, KAPLAMBRATIO (kappa/lambda light chains)  No results found for: HGBA, HGBA2QUANT, HGBFQUANT, HGBSQUAN (Hemoglobinopathy evaluation)   No results found for: LDH  Lab Results  Component Value Date   IRON 93 12/16/2018   IRONPCTSAT 25.6 12/16/2018   (Iron and TIBC)  No results found for: FERRITIN  Urinalysis    Component Value Date/Time   COLORURINE YELLOW 12/20/2019 1521   APPEARANCEUR CLEAR 12/20/2019 1521   LABSPEC 1.017 12/20/2019 1521   PHURINE 6.5 12/20/2019 1521   GLUCOSEU NEGATIVE 12/20/2019 1521   GLUCOSEU NEGATIVE 12/16/2018 1512   HGBUR NEGATIVE 12/20/2019 1521   BILIRUBINUR NEGATIVE 12/16/2018 1512   KETONESUR NEGATIVE 12/20/2019 1521   PROTEINUR NEGATIVE 12/20/2019 1521   UROBILINOGEN 0.2 12/16/2018 1512   NITRITE NEGATIVE 12/20/2019 1521   LEUKOCYTESUR NEGATIVE 12/20/2019 1521    STUDIES: NM Sentinel Node Inj-No Rpt (Breast)  Result Date: 05/08/2021 Sulfur Colloid was injected by the Nuclear Medicine Technologist for sentinel lymph node localization.   MM Breast  Surgical Specimen  Result Date: 05/08/2021 CLINICAL DATA:  Specimen radiograph status post targeted right axillary lymph node dissection. EXAM: SPECIMEN RADIOGRAPH OF THE RIGHT BREAST COMPARISON:  Previous exam(s). FINDINGS: Status post excision of the right breast. The radioactive seed and biopsy marker clip is present within the specimen. These findings were communicated with the OR at 2:39 p.m. IMPRESSION: Specimen radiograph of the right breast. Electronically Signed   By: Ammie Ferrier M.D.   On: 05/08/2021 14:42  [place] RT RADIOACTIVE SEED LOC  Result Date: 05/07/2021 CLINICAL DATA:  Radioactive seed localization of a metastatic axillary lymph node requested prior to surgery. EXAM: ULTRASOUND GUIDED RADIOACTIVE SEED LOCALIZATION OF THE RIGHT AXILLA COMPARISON:  Previous exam(s). FINDINGS: Patient presents for radioactive seed localization prior to surgery. I met with the patient and we discussed the procedure of seed localization including benefits and alternatives. We discussed the high likelihood of a successful procedure. We discussed the risks of the procedure including infection, bleeding, tissue injury and further surgery. We discussed the low dose of radioactivity involved in the procedure. Informed, written consent was given. The usual time-out protocol was performed immediately prior to the procedure. Using ultrasound guidance, sterile technique, 1% lidocaine and an I-125 radioactive seed, a lymph node in the right axilla was localized using an inferior to superior approach. The follow-up mammogram images confirm the seed in the expected location and were marked for Dr. Georgette Dover. Follow-up survey of the patient confirms presence of the radioactive seed. Order number of I-125 seed:  [id number]. Total activity:  3.086 millicuries reference Date: 04/28/2021 The patient tolerated the procedure well and was released from the [facility]. She was given instructions regarding seed removal. IMPRESSION:  Radioactive seed localization of the right axilla. Post seed placement mammogram demonstrates the radioactive seed is located 3 cm superior and posterior to the biopsied axillary lymph node. Findings were discussed with Dr. Georgette Dover. Electronically Signed   By: Lillia Mountain M.D.   On: 05/07/2021 14:59  MM CLIP PLACEMENT RIGHT  Result Date: 05/07/2021 CLINICAL DATA:  Status post radioactive seed placement in a right axillary lymph node. EXAM: 3D DIAGNOSTIC RIGHT MAMMOGRAM POST ULTRASOUND BIOPSY COMPARISON:  Previous exam(s). FINDINGS: 3D Mammographic images were obtained following ultrasound placement of a radioactive seed in the right axilla. IMPRESSION: Post radioactive seed placement mammogram demonstrates the radioactive seed is located approximately 3 cm superior and posterior to the biopsied axillary lymph node. The heart shaped clip could not be localized with a radioactive seed secondary to it not being seen sonographically and not being able to do mammographically because of its location. Findings were discussed with Dr. Georgette Dover. Final Assessment: Post Procedure Mammograms for Marker Placement Electronically Signed   By: Lillia Mountain M.D.   On: 05/07/2021 15:05    ELIGIBLE FOR AVAILABLE RESEARCH PROTOCOL: no  ASSESSMENT: 56 y.o. [place] woman with T1c N1, stage IIB invasive ductal carcinoma, grade 3, functionally triple negative, with an MIB-1 of 30%   (1) genetics test 12/03/2020 through the [facility] +RNAinsight Panel found no deleterious mutations in AIP, ALK, APC, ATM, AXIN2, BAP1, BARD1, BLM, BMPR1A, BRCA1, BRCA2, BRIP1, CDC73, CDH1, CDK4, CDKN1B, CDKN2A, CHEK2, CTNNA1, DICER1, FANCC, FH, FLCN, GALNT12, KIF1B, LZTR1, MAX, MEN1, MET, MLH1, MSH2, MSH3, MSH6, MUTYH, NBN, NF1, NF2, NTHL1, PALB2, PHOX2B, PMS2, POT1, PRKAR1A, PTCH1, PTEN, RAD51C, RAD51D, RB1, RECQL, RET, SDHA, SDHAF2, SDHB, SDHC, SDHD, SMAD4, SMARCA4, SMARCB1, SMARCE1, STK11, SUFU, TMEM127, TP53, TSC1, TSC2, VHL and  XRCC2 (sequencing and deletion/duplication); EGFR, EGLN1, HOXB13, KIT,  MITF, PDGFRA, POLD1, and POLE (sequencing only); EPCAM and GREM1 (deletion/duplication only).    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  She underwent right mastectomy and targeted lymph node dissection, final pathology showing tumor measuring 2.3 cm in largest dimension, grade 3, negative margins with excellent response in the breast however minimal response in the lymph node, 2 out of 4 lymph nodes positive for involvement, extranodal extension present, largest metastasis is 4 mm  PLAN:  Ms. Cryderman is here for follow-up regarding adjuvant Keytruda after definitive surgery every 3 weeks for up to 9 cycles.  She has no adverse effects reported with Keytruda.  We have discussed the pathology in detail She will also have to proceed with adjuvant radiation, she has follow-up scheduled with Dr. Isidore Moos She is weakly ER positive hence we will most likely start her on adjuvant endocrine therapy however she may not derive much benefit from this. Regarding thrush, continue Diflucan, nystatin and we will follow-up. Postsurgical pain, healing appropriately, okay to continue as needed pain medication as prescribed. CBC and CMP reviewed, no concerns.  Return to clinic in 3 weeks for follow-up before next cycle of planned immunotherapy  She will return to clinic with me in first week of February.  She may need an interim visit with Mendel Ryder for monitoring of her counts.  *Total Encounter Time as defined by the Centers for  Medicare and Medicaid Services includes, in addition to the face-to-face time of a patient visit (documented in the note above) non-face-to-face time: obtaining and reviewing outside history, ordering and reviewing medications, tests or procedures, care coordination (communications with other health care professionals or caregivers) and documentation in the medical record.

## 2021-05-22 ENCOUNTER — Encounter: Payer: Self-pay | Admitting: Plastic Surgery

## 2021-05-22 ENCOUNTER — Other Ambulatory Visit: Payer: Self-pay

## 2021-05-22 ENCOUNTER — Ambulatory Visit: Payer: Managed Care, Other (non HMO) | Admitting: Plastic Surgery

## 2021-05-22 DIAGNOSIS — Z171 Estrogen receptor negative status [ER-]: Secondary | ICD-10-CM

## 2021-05-22 DIAGNOSIS — C50411 Malignant neoplasm of upper-outer quadrant of right female breast: Secondary | ICD-10-CM

## 2021-05-22 NOTE — Progress Notes (Signed)
Patient presents for continued expansion of her right tissue expander.  She is 2 weeks postop.  She is then somewhat uncomfortable for the past few days and feels like she has had a recurrence of her oral thrush.  She is wondering if it could be due to her antibiotics that she has been taking with a drain in place.  On exam her skin looks healthy.  The drain is been putting out very little so it was removed today.  I did infiltrate 50 cc into her expander so she now has a total of 100 cc and a 375 cc expander.  She tolerated this fine.  We will plan to see her next week to continue expansion.  She can stop antibiotics.  All of her questions were answered.

## 2021-05-23 ENCOUNTER — Other Ambulatory Visit (HOSPITAL_COMMUNITY): Payer: Self-pay

## 2021-05-29 ENCOUNTER — Other Ambulatory Visit: Payer: Self-pay

## 2021-05-29 ENCOUNTER — Encounter: Payer: Self-pay | Admitting: Plastic Surgery

## 2021-05-29 ENCOUNTER — Ambulatory Visit (INDEPENDENT_AMBULATORY_CARE_PROVIDER_SITE_OTHER): Payer: Managed Care, Other (non HMO) | Admitting: Plastic Surgery

## 2021-05-29 DIAGNOSIS — C50411 Malignant neoplasm of upper-outer quadrant of right female breast: Secondary | ICD-10-CM

## 2021-05-29 DIAGNOSIS — Z171 Estrogen receptor negative status [ER-]: Secondary | ICD-10-CM

## 2021-05-29 NOTE — Progress Notes (Signed)
Patient presents for continued expansion of her right tissue expander.  She tolerated 50 cc last time and feels a little bit better than she did last week.  She has seen her oncologist for treatment regarding her thrush which is bothering her quite a bit.  On exam her skin looks to be healing nicely with no signs of any skin compromise.  No signs of subcutaneous fluid.  Today I infiltrated 75 cc for recurrent total of 175 cc in her 375 cc expander.  She tolerated this fine.  We will plan to see her next week to continue the expansion.  All of her questions were answered.

## 2021-06-02 NOTE — Progress Notes (Signed)
Radiation Oncology         [phone] ________________________________  Name: Robin Farley MRN: [id number]  Date: 06/03/2021  DOB: 23-Jul-1965  Follow-Up Visit Note  Outpatient  CC: [name], MD  Benay Pike, MD  Diagnosis:      ICD-10-CM   1. Malignant neoplasm of overlapping sites of right female breast, unspecified estrogen receptor status ([place])  C50.811 clotrimazole (MYCELEX) 10 MG troche       Cancer Staging  Malignant neoplasm of upper-outer quadrant of right breast in female, estrogen receptor negative ([place]) Staging form: Breast, AJCC 8th Edition - Clinical: Stage IIB (cT1c, cN1, cM0, G3, ER-, PR-, HER2-) - Signed by [name], MD on 11/20/2020 Histologic grading system: 3 grade system - Pathologic stage from 05/08/2021: No Stage Recommended (ypT2, pN1a, cM0, G3, ER-, PR-, HER2-) - Signed by [name], NP on 05/21/2021 Stage prefix: Post-therapy Histologic grading system: 3 grade system   ypT2, ypN1a  S/p right mastectomy: Right Breast UOQ, Invasive Ductal Carcinoma, ER- / PR- / Her2-, Grade 3  CHIEF COMPLAINT: Here to discuss management of right breast cancer  Narrative:  The patient returns today for follow-up.     Since breast clinic consultation date of 11/20/20, she underwent genetic testing on 11/20/20 which revealed no deleterious mutations by +RNAinsight testing. 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   Breast or nodal biopsies, since consultation, involved (dates and results as follows): Right breast retroareolar biopsy performed on 12/09/2020 revealed grade 2 invasive ductal carcinoma measuring 0.3 cm in the greatest tumor dimension. An additional right breast retroareolar biopsy also revealed tubular adenoma.   Systemic therapy, if applicable, involved (dates and therapy as follows): The patient has received neoadjuvant chemotherapy consisting of carboplatin and paclitaxel with  pembrolizumab on 12/03/2020 through 01/21/2021 (3 cycles). She in currently undergoing neoadjuvant chemotherapy consisting of doxorubicin and cyclophosphamide with pembrolizumab every 21 days x 4 starting on 02/04/2021 - present. 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 Additional chemo toxicities reported by the patient include altered taste and weakness.  The patient opted to proceed with right mastectomy and nodal dissections on 05/08/20 under the care of Dr. Georgette Dover.  Pathology from the procedure revealed: tumor size of 2.3 x 1.7 x 1.2 cm; histology of grade 3 invasive ductal carcinoma associated with extensive fibrosis and mild inflammation; all margins negative for invasive carcinoma; margin status to invasive disease of 15 mm from the posterior margin;  nodal status of 2/4 right axillary lymph nodes positive for metastatic carcinoma (largest metastasis measuring 0.4 cm);  ER status: negative; PR status negative; Proliferation marker Ki67 at 5%; Her2 status negative; Grade 3.  Per her most recent follow up visit with Dr. Chryl Heck on 05/20/21, the patient is continuing to heal from her procedure. She reports a lot of pain around the right shoulder and right arm pit, especially with movement. The patient is also dealing with ongoing oral thrush, and is not eating well according her to her husband.   Pertinent imaging performed since the patient was seen for consultation includes the following:  -- Bilateral breast MRI w/ and w/o contrast on 04/11/21 demonstrated significant improvement in the appearance of the right breast, and improved skin thickening and skin tethering. MRI also showed: persistent scattered foci of non mass enhancements throughout the retroareolar and UOQ of the right breast spanning approximately 6 cm; stable appearance of a 6 mm focus of enhancement at the middle  depth of the UIQ of the right breast; and the smaller appearance of the previously biopsied  right axillary lymph node with normal morphology. No suspicious findings were otherwise appreciated within the left breast.  Symptomatically, the patient reports:    Lymphedema issues, if any:  Patient denies, and denies any range of motion difficulty    Pain issues, if any:  Reports mild tenderness to right lateral aspect of right breast   SAFETY ISSUES: Prior radiation? No Pacemaker/ICD? No Possible current pregnancy? No--postmenopausal  Is the patient on methotrexate? No  Current Complaints / other details:  Reports she's been dealing with thrush for ~5 months    Still having tissue expansion via. Dr. Claudia Desanctis.  Receiving adjuvant pembrolizumab.        ALLERGIES:  is allergic to promethazine hcl.  Meds: Current Outpatient Medications  Medication Sig Dispense Refill   clotrimazole (MYCELEX) 10 MG troche Take 1 tablet (10 mg total) by mouth 5 (five) times daily. Start once someone calls you to commence this Rx. 140 Troche 0   atorvastatin (LIPITOR) 20 MG tablet Take 1 tablet (20 mg total) by mouth daily. 90 tablet 3   fluconazole (DIFLUCAN) 200 MG tablet Take 1 tablet (200 mg total) by mouth daily. 30 tablet 1   magic mouthwash (nystatin, lidocaine, diphenhydrAMINE, alum & mag hydroxide) suspension Swish and spit 5 mls 4 times a day before meals and at bedtime as needed. 240 mL 3   metFORMIN (GLUCOPHAGE-XR) 500 MG 24 hr tablet Take 1 tablet (500 mg total) by mouth daily with breakfast. 90 tablet 3   ondansetron (ZOFRAN) 8 MG tablet Take 1 tablet (8 mg total) by mouth every 8 (eight) hours as needed for nausea or vomiting. 20 tablet 3   ondansetron (ZOFRAN-ODT) 4 MG disintegrating tablet Take 1 tablet (4 mg total) by mouth every 8 (eight) hours as needed for nausea or vomiting. 20 tablet 0   valACYclovir (VALTREX) 1000 MG tablet TAKE 1 TABLET BY MOUTH TWICE A DAY (Patient taking differently: Take 1,000 mg by mouth daily.) 60 tablet 1   No current facility-administered medications for  this encounter.   Facility-Administered Medications Ordered in Other Encounters  Medication Dose Route Frequency Provider Last Rate Last Admin   sodium chloride flush (NS) 0.9 % injection 10 mL  10 mL Intracatheter PRN Magrinat, Virgie Dad, MD   10 mL at 01/21/21 1144   sodium chloride flush (NS) 0.9 % injection 10 mL  10 mL Intracatheter PRN Magrinat, Virgie Dad, MD   10 mL at 03/20/21 1159    Physical Findings:  height is $RemoveB'5\' 6"'vkmocRBq$  (1.676 m) and weight is 133 lb 6.4 oz (60.5 kg). Her temperature is 97.7 F (36.5 C). Her blood pressure is 119/80 and her pulse is 88. Her respiration is 18 and oxygen saturation is 97%. .     General: Alert and oriented, in no acute distress HEENT: Subtle white coating to tongue - no colonies of thrush appreciated Psychiatric: Judgment and insight are intact. Affect is appropriate. Breast exam reveals excellent healing at right mastectomy scar, good tissue expansion thus far  Lab Findings: Lab Results  Component Value Date   WBC 5.3 05/20/2021   HGB 10.9 (L) 05/20/2021   HCT 31.7 (L) 05/20/2021   MCV 99.7 05/20/2021   PLT 260 05/20/2021    '@LASTCHEMISTRY'$ @  Radiographic Findings: NM Sentinel Node Inj-No Rpt (Breast)  Result Date: 05/08/2021 Sulfur Colloid was injected by the Nuclear Medicine Technologist for sentinel lymph node localization.  MM Breast Surgical Specimen  Result Date: 05/08/2021 CLINICAL DATA:  Specimen radiograph status post targeted right axillary lymph node dissection. EXAM: SPECIMEN RADIOGRAPH OF THE RIGHT BREAST COMPARISON:  Previous exam(s). FINDINGS: Status post excision of the right breast. The radioactive seed and biopsy marker clip is present within the specimen. These findings were communicated with the OR at 2:39 p.m. IMPRESSION: Specimen radiograph of the right breast. Electronically Signed   By: Ammie Ferrier M.D.   On: 05/08/2021 14:42  [place] RT RADIOACTIVE SEED LOC  Result Date: 05/07/2021 CLINICAL DATA:  Radioactive  seed localization of a metastatic axillary lymph node requested prior to surgery. EXAM: ULTRASOUND GUIDED RADIOACTIVE SEED LOCALIZATION OF THE RIGHT AXILLA COMPARISON:  Previous exam(s). FINDINGS: Patient presents for radioactive seed localization prior to surgery. I met with the patient and we discussed the procedure of seed localization including benefits and alternatives. We discussed the high likelihood of a successful procedure. We discussed the risks of the procedure including infection, bleeding, tissue injury and further surgery. We discussed the low dose of radioactivity involved in the procedure. Informed, written consent was given. The usual time-out protocol was performed immediately prior to the procedure. Using ultrasound guidance, sterile technique, 1% lidocaine and an I-125 radioactive seed, a lymph node in the right axilla was localized using an inferior to superior approach. The follow-up mammogram images confirm the seed in the expected location and were marked for Dr. Georgette Dover. Follow-up survey of the patient confirms presence of the radioactive seed. Order number of I-125 seed:  [id number]. Total activity:  1.914 millicuries reference Date: 04/28/2021 The patient tolerated the procedure well and was released from the [facility]. She was given instructions regarding seed removal. IMPRESSION: Radioactive seed localization of the right axilla. Post seed placement mammogram demonstrates the radioactive seed is located 3 cm superior and posterior to the biopsied axillary lymph node. Findings were discussed with Dr. Georgette Dover. Electronically Signed   By: Lillia Mountain M.D.   On: 05/07/2021 14:59  MM CLIP PLACEMENT RIGHT  Result Date: 05/07/2021 CLINICAL DATA:  Status post radioactive seed placement in a right axillary lymph node. EXAM: 3D DIAGNOSTIC RIGHT MAMMOGRAM POST ULTRASOUND BIOPSY COMPARISON:  Previous exam(s). FINDINGS: 3D Mammographic images were obtained following ultrasound placement of a  radioactive seed in the right axilla. IMPRESSION: Post radioactive seed placement mammogram demonstrates the radioactive seed is located approximately 3 cm superior and posterior to the biopsied axillary lymph node. The heart shaped clip could not be localized with a radioactive seed secondary to it not being seen sonographically and not being able to do mammographically because of its location. Findings were discussed with Dr. Georgette Dover. Final Assessment: Post Procedure Mammograms for Marker Placement Electronically Signed   By: Lillia Mountain M.D.   On: 05/07/2021 15:05   Impression/Plan: Right breast cancer, locally advanced.   Messages sent to Dr. Chryl Heck and Dr. Claudia Desanctis re: coordination of care. She is still undergoing tissue expansion. CT simulation will be delayed.  I gave her a Rx for clotrimazole troches for her subtle thrush that persists. I asked for Dr Chryl Heck to verify if she wants her to continue fluconazole as well, or drop it and just try the troches. She says she has been on fluconazole for ~5 mo  We discussed adjuvant radiotherapy today.  I recommend radiation to the right chest wall, SCV, PAB, and IM nodes in order to reduce risk of Locoregional recurrence by 2/3.  I reviewed the logistics, benefits, risks, and potential side effects of  this treatment in detail. Risks may include but not necessary be limited to acute and late injury tissue in the radiation fields such as skin irritation (change in color/pigmentation, itching, dryness, pain, peeling). She may experience fatigue. We also discussed possible risk of long term cosmetic changes or scar tissue. We discussed possible pneumonitis. There is also a smaller risk for   brachial plexopathy, lymphedema, musculoskeletal changes, rib fragility or induction of a second malignancy. There are potential complications from reconstructive surgery such as capsular contracture and /or late chronic non-healing soft tissue wound.    The patient asked good  questions which I answered to her satisfaction. She is enthusiastic about proceeding with treatment. A consent form has been signed and placed in her chart.   I will coordinate care with the team and reschedule CT simulation when appropriate.  On date of service, in total, I spent 45 minutes on this encounter. Patient was seen in person.  _____________________________________   Eppie Gibson, MD  This document serves as a record of services personally performed by Eppie Gibson, MD. It was created on her behalf by Roney Mans, a trained medical scribe. The creation of this record is based on the scribe's personal observations and the provider's statements to them. This document has been checked and approved by the attending provider.

## 2021-06-02 NOTE — Progress Notes (Signed)
Location of Breast Cancer:  Malignant neoplasm of upper-outer quadrant of right breast in female, estrogen receptor negative  Histology per Pathology Report:  05/08/2021 FINAL MICROSCOPIC DIAGNOSIS:  A. LYMPH NODE, TARGETED RIGHT AXILLARY, DISSECTION:  - Metastatic carcinoma in two of four lymph nodes (2/4).  - Largest metastasis is 0.4 cm (node with seed).  - Extranodal extension (node with seed).  B. BREAST, RIGHT, MASTECTOMY:  - Invasive ductal carcinoma associated with extensive fibrosis and mild  inflammation.  - Margins not involved.  - Biopsy sites and biopsy clips.   Receptor Status: ER(5%), PR (0%), Her2-neu (Negative via FISH), Ki-67(30%)  Did patient present with symptoms (if so, please note symptoms) or was this found on screening mammography?: Patient presented with a new palpable lump in the right breast. She underwent bilateral diagnostic mammography with tomography and right breast ultrasonography at [facility] on 11/06/2020 showing: breast density category B; 1.9 cm mass at palpable site of concern in right breast at 10 o'clock; 0.8 cm smaller mass in right breast at 9 o'clock; 0.7 cm small mass in right axilla, possibly a replaced lymph node; second mildly suspicious lymph node in axilla.  Past/Anticipated interventions by surgeon, if any:  05/08/2021 --Dr. Donnie Mesa  Right mastectomy Right targeted axillary lymph node dissection with sentinel lymph node biopsy --Dr. Mingo Amber  Right breast reconstruction with tissue expander and acellular dermal matrix Indocyanine green angiography right mastectomy flap  Past/Anticipated interventions by medical oncology, if any:  Under care of Dr. 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 Bernat is here for follow-up regarding adjuvant Keytruda after definitive surgery every 3 weeks for up to 9 cycles.  She has no adverse effects reported with Beryle Flock She is weakly ER positive hence we will most likely start her on adjuvant endocrine therapy however she may not derive much benefit from this. Return to clinic in 3 weeks for follow-up before next cycle of planned immunotherapy  Lymphedema issues, if any:  Patient denies, and denies any range of motion difficulty    Pain issues, if any:  Reports mild tenderness to right lateral aspect of right breast   SAFETY ISSUES: Prior radiation? No Pacemaker/ICD? No Possible current pregnancy? No--postmenopausal  Is the patient on methotrexate? No  Current Complaints / other details:  Reports she's been dealing with thrush for ~5 months

## 2021-06-03 ENCOUNTER — Ambulatory Visit: Payer: Managed Care, Other (non HMO) | Admitting: Radiation Oncology

## 2021-06-03 ENCOUNTER — Other Ambulatory Visit: Payer: Self-pay

## 2021-06-03 ENCOUNTER — Ambulatory Visit
Admission: RE | Admit: 2021-06-03 | Discharge: 2021-06-03 | Disposition: A | Discharge status: Home / Self Care | Payer: Managed Care, Other (non HMO) | Source: Ambulatory Visit | Attending: Radiation Oncology | Admitting: Radiation Oncology

## 2021-06-03 ENCOUNTER — Encounter: Payer: Self-pay | Admitting: Radiation Oncology

## 2021-06-03 DIAGNOSIS — Z7984 Long term (current) use of oral hypoglycemic drugs: Secondary | ICD-10-CM | POA: Diagnosis not present

## 2021-06-03 DIAGNOSIS — C50411 Malignant neoplasm of upper-outer quadrant of right female breast: Secondary | ICD-10-CM

## 2021-06-03 DIAGNOSIS — M25511 Pain in right shoulder: Secondary | ICD-10-CM | POA: Diagnosis not present

## 2021-06-03 DIAGNOSIS — Z9011 Acquired absence of right breast and nipple: Secondary | ICD-10-CM | POA: Diagnosis not present

## 2021-06-03 DIAGNOSIS — B370 Candidal stomatitis: Secondary | ICD-10-CM | POA: Insufficient documentation

## 2021-06-03 DIAGNOSIS — Z171 Estrogen receptor negative status [ER-]: Secondary | ICD-10-CM | POA: Insufficient documentation

## 2021-06-03 DIAGNOSIS — C50811 Malignant neoplasm of overlapping sites of right female breast: Secondary | ICD-10-CM | POA: Diagnosis not present

## 2021-06-03 DIAGNOSIS — R531 Weakness: Secondary | ICD-10-CM | POA: Diagnosis not present

## 2021-06-03 DIAGNOSIS — Z79899 Other long term (current) drug therapy: Secondary | ICD-10-CM | POA: Diagnosis not present

## 2021-06-03 DIAGNOSIS — Z170 Estrogen receptor positive status [ER+]: Secondary | ICD-10-CM

## 2021-06-03 MED ORDER — CLOTRIMAZOLE 10 MG MT TROC: 10.0000 mg | Freq: Every day | OROMUCOSAL | 0 refills | Status: DC

## 2021-06-04 ENCOUNTER — Telehealth: Payer: Self-pay

## 2021-06-04 NOTE — Telephone Encounter (Signed)
Called and spoke with patient to let her know both Dr. Isidore Moos and Dr. Chryl Heck agreed she could stop taking the fluconazole, and start using the clotrimazole troches Dr. Isidore Moos prescribed yesterday. Patient verbalized understanding and appreciation of call. No other needs identified at this time

## 2021-06-05 ENCOUNTER — Ambulatory Visit (INDEPENDENT_AMBULATORY_CARE_PROVIDER_SITE_OTHER): Payer: Managed Care, Other (non HMO) | Admitting: Plastic Surgery

## 2021-06-05 ENCOUNTER — Other Ambulatory Visit: Payer: Self-pay

## 2021-06-05 ENCOUNTER — Ambulatory Visit: Payer: Managed Care, Other (non HMO) | Attending: Hematology and Oncology

## 2021-06-05 ENCOUNTER — Encounter: Payer: Self-pay | Admitting: Plastic Surgery

## 2021-06-05 DIAGNOSIS — C50411 Malignant neoplasm of upper-outer quadrant of right female breast: Secondary | ICD-10-CM | POA: Insufficient documentation

## 2021-06-05 DIAGNOSIS — Z171 Estrogen receptor negative status [ER-]: Secondary | ICD-10-CM

## 2021-06-05 DIAGNOSIS — R293 Abnormal posture: Secondary | ICD-10-CM | POA: Diagnosis present

## 2021-06-05 NOTE — Therapy (Signed)
[facility] @ [facility] [place], [place], [address] Phone: [phone]   Fax:  [phone]  Physical Therapy Treatment  Patient Details  Name: Robin Farley MRN: [id number] Date of Birth: Sep 03, 1965 Referring Provider (PT): Dr. Chryl Heck   Encounter Date: 06/05/2021   PT End of Session - 06/05/21 1306     Visit Number 2    Number of Visits 2    Date for PT Re-Evaluation 06/17/21    PT Start Time 4174    PT Stop Time 1335    PT Time Calculation (min) 29 min    Activity Tolerance Patient tolerated treatment well    Behavior During Therapy [facility] for tasks assessed/performed             Past Medical History:  Diagnosis Date   Abdominal pain, generalized 12/27/2008   Acute sinusitis, unspecified 02/26/2007   ADD 03/13/2010   ALLERGIC RHINITIS 02/23/2007   ANEMIA 02/05/2007   Anxiety 11/17/2011   ATTENTION DEFICIT DISORDER, HX OF 02/05/2007   Breast cancer ([place])    Deviated nasal septum 02/05/2007   Diabetes mellitus without complication ([place])    Family history of breast cancer 11/20/2020   Family history of melanoma 11/20/2020   FATIGUE 02/23/2007   HYPERLIPIDEMIA 02/23/2007   Personal history of malignant melanoma 11/20/2020   SWELLING MASS OR LUMP IN HEAD AND NECK 07/27/2008   UTI 02/23/2007    Past Surgical History:  Procedure Laterality Date   BREAST RECONSTRUCTION WITH PLACEMENT OF TISSUE EXPANDER AND FLEX HD (ACELLULAR HYDRATED DERMIS) Right 05/08/2021   Procedure: BREAST RECONSTRUCTION WITH PLACEMENT OF TISSUE EXPANDER AND FLEX HD (ACELLULAR HYDRATED DERMIS);  Surgeon: Cindra Presume, MD;  Location: [facility];  Service: Plastics;  Laterality: Right;   BROW LIFT     CESAREAN SECTION     COSMETIC SURGERY  1995   Nas[place]   MASTECTOMY W/ SENTINEL NODE BIOPSY Right 05/08/2021   Procedure: RIGHT MASTECTOMY WITH SENTINEL LYMPH NODE BIOPSY;  Surgeon: Donnie Mesa, MD;  Location: [place] [facility];  Service: General;  Laterality: Right;   MELANOMA EXCISION WITH SENTINEL LYMPH NODE BIOPSY Left 10/24/2012   Procedure: MELANOMA wide EXCISION left lateral thigh WITH SENTINEL LYMPH NODE BIOPSY left groin;  Surgeon: Edward Jolly, MD;  Location: [place];  Service: General;  Laterality: Left;   NASAL SEPTUM SURGERY     PORTACATH PLACEMENT Right 12/02/2020   Procedure: INSERTION PORT-A-CATH;  Surgeon: Donnie Mesa, MD;  Location: [facility];  Service: General;  Laterality: Right;   RADIOACTIVE SEED GUIDED AXILLARY SENTINEL LYMPH NODE Right 05/08/2021   Procedure: RADIOACTIVE SEED GUIDED AXILLARY SENTINEL LYMPH NODE DISSECTION;  Surgeon: Donnie Mesa, MD;  Location: [place];  Service: General;  Laterality: Right;    There were no vitals filed for this visit.   Subjective Assessment - 06/05/21 1303     Subjective Pt is 1 month s/p right mastectomy with 2/4 LN's and immediate expander.  Drains were removed 2 weeks ago.   Has been doing some of the exercises. No pain presently in right side, but my mouth is really hurting from thrush. My ROM is doing well.    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  She had neoadjuvant chemo, and had  a right mastectomy with immediate expander on 05/08/2021. She will be having radiation.  Patient Stated Goals post op reassessment    Currently in Pain? Yes    Pain Score 4     Pain Location Mouth    Pain Descriptors / Indicators Burning;Tingling   itching   Pain Type Chronic pain    Pain Onset More than a month ago    Pain Frequency Constant    Pain Relieving Factors Magic mouthwash, lozengers    Effect of Pain on Daily Activities not eating well                [facility] PT Assessment - 06/05/21 0001       Assessment   Medical Diagnosis Right breast CA    Referring Provider (PT) Dr.  Chryl Heck    Onset Date/Surgical Date 05/08/21    Hand Dominance Right      Precautions   Precaution Comments lymphedema risk      Prior Function   Level of Independence Independent      Cognition   Overall Cognitive Status Within Functional Limits for tasks assessed      Observation/Other Assessments   Observations Incision well healed. Scab present over drain site. No visible swelling      AROM   Right/Left Shoulder Right    Right Shoulder Extension 59 Degrees    Right Shoulder Flexion 149 Degrees    Right Shoulder ABduction 155 Degrees    Right Shoulder Internal Rotation 70 Degrees    Right Shoulder External Rotation 103 Degrees               LYMPHEDEMA/ONCOLOGY QUESTIONNAIRE - 06/05/21 0001       Treatment   Active Chemotherapy Treatment No    Past Chemotherapy Treatment Yes    Active Radiation Treatment --   pending in 2-3 weeks   Past Radiation Treatment No    Current Hormone Treatment --   possible   Past Hormone Therapy No      What other symptoms do you have   Are you Having Heaviness or Tightness No    Are you having Pain No    Are you having pitting edema No    Is it Hard or Difficult finding clothes that fit No    Do you have infections No    Is there Decreased scar mobility No      Right Upper Extremity Lymphedema   10 cm Proximal to Olecranon Process 24.9 cm    Olecranon Process 23.4 cm    10 cm Proximal to Ulnar Styloid Process 19.4 cm    Just Proximal to Ulnar Styloid Process 14.9 cm    At Base of 2nd Digit 6.05 cm                Quick Dash - 06/05/21 0001     Open a tight or new jar Unable    Do heavy household chores (wash walls, wash floors) Unable    Carry a shopping bag or briefcase Moderate difficulty    Wash your back Moderate difficulty    Use a knife to cut food No difficulty    Recreational activities in which you take some force or impact through your arm, shoulder, or hand (golf, hammering, tennis) Unable    During the  past week, to what extent has your arm, shoulder or hand problem interfered with your normal social activities with family, friends, neighbors, or groups? Modererately    During the past week, to what extent has your arm, shoulder or hand problem limited your work  or other regular daily activities Modererately    Arm, shoulder, or hand pain. Mild    Tingling (pins and needles) in your arm, shoulder, or hand Moderate    Difficulty Sleeping No difficulty    DASH Score 52.27 %                                  PT Long Term Goals - 06/05/21 1347       PT LONG TERM GOAL #1   Title Pts right shoulder ROM will be within 5-10 degrees of baseline ROM    Baseline Yes for all except abduction, but pt is capable of working on this at home    Time 6    Period Weeks    Status Not Met    Target Date 06/17/21                   Plan - 06/05/21 1340     Clinical Impression Statement Pt was here for post op reassessment.  She has done well with shoulder ROM but is still lacking 25 degrees of abduction and 7 degrees of shoulder flexion. We reviewed exercises and discussed pt doing shoulder flexion in supine, and abduction closer to the wall with lean in to improve mobility.  Circumference measures were WNL. She attended ABC class on Monday, and she was given a script today to purchase a compression sleeve and gauntlet for prophylactic reasons..  We discussed radiation and being sure to check her ROM daily to be sure she doesn't get tight and to watch for swelling.  She is doing very well and had no other questions.  She is discharged from formal PT but will keep working on her shoulder exs. She was advised to call or email with questions or concerns.    Personal Factors and Comorbidities Comorbidity 3+    Comorbidities Right triple neg. breast Cancer, prior melanoma, DM, anxiety,ADD    Stability/Clinical Decision Making Stable/Uncomplicated    Rehab Potential Excellent    PT  Frequency --   no further visits scheduled   PT Duration 6 weeks    PT Treatment/Interventions Therapeutic exercise;Patient/family education;ADLs/Self Care Home Management    PT Next Visit Plan Pt discharged from formal PT.  Pt will continue shoulder exs doing flexion in supine    PT Home Exercise Plan 4 post op exercises    Recommended Other Services compression sleeve/gauntlet;script given    Consulted and Agree with Plan of Care Patient             Patient will benefit from skilled therapeutic intervention in order to improve the following deficits and impairments:  Postural dysfunction, Impaired UE functional use, Decreased range of motion, Decreased knowledge of precautions  Visit Diagnosis: Malignant neoplasm of upper-outer quadrant of right breast in female, estrogen receptor negative ([place])  Abnormal posture     Problem List Patient Active Problem List   Diagnosis Date Noted   Malignant neoplasm of overlapping sites of right female breast ([place]) 06/03/2021   Elevated LFTs 12/10/2020   Genetic testing 12/06/2020   Port-A-Cath in place 12/03/2020   Family history of breast cancer 11/20/2020   Personal history of malignant melanoma 11/20/2020   Family history of melanoma 11/20/2020   Malignant neoplasm of upper-outer quadrant of right breast in female, estrogen receptor negative ([place]) 11/12/2020   Vitamin D deficiency 06/02/2019   Hyperglycemia 06/02/2019   Blood pressure elevated  without history of HTN 06/02/2019   Headache 12/16/2018   Urinary incontinence, urge 12/14/2017   Calcific bursitis of shoulder 07/10/2015   Nausea alone 04/27/2013   Melanoma of thigh ([place]) 10/06/2012   Anxiety 11/17/2011   Rosacea 07/08/2011   Right shoulder pain 07/08/2011   Knee effusion, left 08/18/2010   Encounter for well adult exam with abnormal findings 08/18/2010   Attention deficit disorder 03/13/2010   GERD 12/27/2008   Hyperlipidemia 02/23/2007   DEPRESSION 02/23/2007    ALLERGIC RHINITIS 02/23/2007   FATIGUE 02/23/2007   ANEMIA 02/05/2007   Deviated nasal septum 02/05/2007  PHYSICAL THERAPY DISCHARGE SUMMARY  Visits from Start of Care: 2  Current functional level related to goals / functional outcomes: Achieved STGs, did not achieve LTG    Remaining deficits: Lacking 25 deg of abduction, 7 degrees flexion but will continue to work on at home   Education / Equipment: Script for compression sleeve   Patient agrees to discharge. Patient goals were not met. Patient is being discharged due to being pleased with the current functional level. She will continue to work on ROM at home.   Claris Pong, PT 06/05/2021, 1:47 PM  [facility] @ [place], [place], [address] Phone: [phone]   Fax:  [phone]  Name: WOODIE TRUSTY MRN: [id number] Date of Birth: 1965/10/23

## 2021-06-05 NOTE — Progress Notes (Signed)
Patient presents to continue tissue expansion in her right breast.  She currently has 175 cc in her 375 cc expander.  She did well with the fill last time and has no new complaints.  Today I placed another 100 cc in her expander to give her a current volume of 275 cc.  This looks to be a pretty nice shape for her and she is interested in continuing the expansion.  I suspect with another 100 cc next week she should be pretty close to fully expanded.  We will plan to see her next week and continue the process.  All of her questions were answered.

## 2021-06-05 NOTE — Patient Instructions (Addendum)
° ° °   [facility]  [address]  [place] [address]  [phone]  After Breast Cancer Class It is recommended you attend the ABC class to be educated on lymphedema risk reduction. This class is free of charge and lasts for 1 hour. It is a 1-time class. You will need to download the Webex app either on your phone or computer. We will send you a link the night before or the morning of the class. You should be able to click on that link to join the class. This is not a confidential class. You don't have to turn your camera on, but other participants may be able to see your email address.  Scar massage You can begin gentle scar massage to your drain incision sites. Gently place one hand on the incision and move the skin (without sliding on the skin) in various directions. Do this for a few minutes and then you can gently massage either coconut oil or vitamin E cream into the scars.  Compression garment You should continue wearing your compression bra until you feel like you no longer have swelling.  Home exercise Program Continue doing the exercises you were given until you feel like you can do them without feeling any tightness at the end.   Walking Program Studies show that 30 minutes of walking per day (fast enough to elevate your heart rate) can significantly reduce the risk of a cancer recurrence. If you can't walk due to other medical reasons, we encourage you to find another activity you could do (like a stationary bike or water exercise).  Posture After breast cancer surgery, people frequently sit with rounded shoulders posture because it puts their incisions on slack and feels better. If you sit like this and scar tissue forms in that position, you can become very tight and have pain sitting or standing with good posture. Try to be aware of your posture and sit and stand up tall to heal properly.  Follow up PT: It is recommended you return every 3  months for the first 3 years following surgery to be assessed on the SOZO machine for an L-Dex score. This helps prevent clinically significant lymphedema in 95% of patients. These follow up screens are 10 minute appointments that you are not billed for.

## 2021-06-10 ENCOUNTER — Inpatient Hospital Stay: Payer: Managed Care, Other (non HMO)

## 2021-06-10 ENCOUNTER — Ambulatory Visit: Payer: Managed Care, Other (non HMO)

## 2021-06-10 ENCOUNTER — Other Ambulatory Visit: Payer: Self-pay | Admitting: *Deleted

## 2021-06-10 ENCOUNTER — Other Ambulatory Visit: Payer: Self-pay

## 2021-06-10 ENCOUNTER — Inpatient Hospital Stay: Payer: Managed Care, Other (non HMO) | Admitting: Hematology and Oncology

## 2021-06-10 ENCOUNTER — Inpatient Hospital Stay: Payer: Managed Care, Other (non HMO) | Admitting: Nutrition

## 2021-06-10 ENCOUNTER — Encounter: Payer: Self-pay | Admitting: *Deleted

## 2021-06-10 ENCOUNTER — Encounter: Payer: Self-pay | Admitting: Hematology and Oncology

## 2021-06-10 VITALS — BP 123/61 | HR 95 | Temp 97.7°F | Resp 18 | Wt 131.1 lb

## 2021-06-10 DIAGNOSIS — C50411 Malignant neoplasm of upper-outer quadrant of right female breast: Secondary | ICD-10-CM | POA: Diagnosis not present

## 2021-06-10 DIAGNOSIS — D849 Immunodeficiency, unspecified: Secondary | ICD-10-CM

## 2021-06-10 DIAGNOSIS — Z95828 Presence of other vascular implants and grafts: Secondary | ICD-10-CM

## 2021-06-10 DIAGNOSIS — K1379 Other lesions of oral mucosa: Secondary | ICD-10-CM

## 2021-06-10 DIAGNOSIS — B370 Candidal stomatitis: Secondary | ICD-10-CM

## 2021-06-10 DIAGNOSIS — Z170 Estrogen receptor positive status [ER+]: Secondary | ICD-10-CM

## 2021-06-10 DIAGNOSIS — B3781 Candidal esophagitis: Secondary | ICD-10-CM

## 2021-06-10 DIAGNOSIS — C50811 Malignant neoplasm of overlapping sites of right female breast: Secondary | ICD-10-CM

## 2021-06-10 DIAGNOSIS — Z5112 Encounter for antineoplastic immunotherapy: Secondary | ICD-10-CM | POA: Diagnosis not present

## 2021-06-10 LAB — COMPREHENSIVE METABOLIC PANEL
ALT: 12 U/L (ref 0–44)
AST: 15 U/L (ref 15–41)
Albumin: 3.9 g/dL (ref 3.5–5.0)
Alkaline Phosphatase: 86 U/L (ref 38–126)
Anion gap: 10 (ref 5–15)
BUN: 7 mg/dL (ref 6–20)
CO2: 26 mmol/L (ref 22–32)
Calcium: 9.4 mg/dL (ref 8.9–10.3)
Chloride: 108 mmol/L (ref 98–111)
Creatinine, Ser: 0.9 mg/dL (ref 0.44–1.00)
GFR, Estimated: >60 mL/min (ref >60)
Glucose, Bld: 118 mg/dL — ABNORMAL HIGH (ref 70–99)
Potassium: 3.2 mmol/L — ABNORMAL LOW (ref 3.5–5.1)
Sodium: 144 mmol/L (ref 135–145)
Total Bilirubin: 0.3 mg/dL (ref 0.3–1.2)
Total Protein: 6.6 g/dL (ref 6.5–8.1)

## 2021-06-10 LAB — CBC WITH DIFFERENTIAL/PLATELET
Abs Immature Granulocytes: 0 10*3/uL (ref 0.00–0.07)
Basophils Absolute: 0 10*3/uL (ref 0.0–0.1)
Basophils Relative: 0 %
Eosinophils Absolute: 0 10*3/uL (ref 0.0–0.5)
Eosinophils Relative: 0 %
HCT: 32 % — ABNORMAL LOW (ref 36.0–46.0)
Hemoglobin: 10.8 g/dL — ABNORMAL LOW (ref 12.0–15.0)
Immature Granulocytes: 0 %
Lymphocytes Relative: 20 %
Lymphs Abs: 0.7 10*3/uL (ref 0.7–4.0)
MCH: 33.8 pg (ref 26.0–34.0)
MCHC: 33.8 g/dL (ref 30.0–36.0)
MCV: 100 fL (ref 80.0–100.0)
Monocytes Absolute: 0.3 10*3/uL (ref 0.1–1.0)
Monocytes Relative: 9 %
Neutro Abs: 2.4 10*3/uL (ref 1.7–7.7)
Neutrophils Relative %: 71 %
Platelets: 185 10*3/uL (ref 150–400)
RBC: 3.2 MIL/uL — ABNORMAL LOW (ref 3.87–5.11)
RDW: 12.2 % (ref 11.5–15.5)
WBC: 3.4 10*3/uL — ABNORMAL LOW (ref 4.0–10.5)
nRBC: 0 % (ref 0.0–0.2)

## 2021-06-10 LAB — TSH: TSH: 2.59 u[IU]/mL (ref 0.308–3.960)

## 2021-06-10 MED ORDER — HEPARIN SOD (PORK) LOCK FLUSH 100 UNIT/ML IV SOLN
500.0000 [IU] | Freq: Once | INTRAVENOUS | Status: AC | PRN
  Administered 2021-06-10: 500 [IU]

## 2021-06-10 MED ORDER — SODIUM CHLORIDE 0.9 % IV SOLN: Freq: Once | INTRAVENOUS | Status: AC

## 2021-06-10 MED ORDER — SODIUM CHLORIDE 0.9% FLUSH
10.0000 mL | INTRAVENOUS | Status: DC | PRN
  Administered 2021-06-10: 10 mL

## 2021-06-10 MED ORDER — SODIUM CHLORIDE 0.9 % IV SOLN
200.0000 mg | Freq: Once | INTRAVENOUS | Status: AC
  Administered 2021-06-10: 200 mg via INTRAVENOUS
  Filled 2021-06-10: qty 200

## 2021-06-10 NOTE — Progress Notes (Signed)
nga

## 2021-06-10 NOTE — Patient Instructions (Signed)
[facility]   ?Discharge Instructions: ?Thank you for choosing [facility] to provide your oncology and hematology care.  ? ?If you have a lab appointment with the Cancer Center, please go directly to the Cancer Center and check in at the registration area. ?  ?Wear comfortable clothing and clothing appropriate for easy access to any Portacath or PICC line.  ? ?We strive to give you quality time with your provider. You may need to reschedule your appointment if you arrive late (15 or more minutes).  Arriving late affects you and other patients whose appointments are after yours.  Also, if you miss three or more appointments without notifying the office, you may be dismissed from the clinic at the provider?s discretion.    ?  ?For prescription refill requests, have your pharmacy contact our office and allow 72 hours for refills to be completed.   ? ?Today you received the following chemotherapy and/or immunotherapy agents: pembrolizumab    ?  ?To help prevent nausea and vomiting after your treatment, we encourage you to take your nausea medication as directed. ? ?BELOW ARE SYMPTOMS THAT SHOULD BE REPORTED IMMEDIATELY: ?*FEVER GREATER THAN 100.4 F (38 ?C) OR HIGHER ?*CHILLS OR SWEATING ?*NAUSEA AND VOMITING THAT IS NOT CONTROLLED WITH YOUR NAUSEA MEDICATION ?*UNUSUAL SHORTNESS OF BREATH ?*UNUSUAL BRUISING OR BLEEDING ?*URINARY PROBLEMS (pain or burning when urinating, or frequent urination) ?*BOWEL PROBLEMS (unusual diarrhea, constipation, pain near the anus) ?TENDERNESS IN MOUTH AND THROAT WITH OR WITHOUT PRESENCE OF ULCERS (sore throat, sores in mouth, or a toothache) ?UNUSUAL RASH, SWELLING OR PAIN  ?UNUSUAL VAGINAL DISCHARGE OR ITCHING  ? ?Items with * indicate a potential emergency and should be followed up as soon as possible or go to the Emergency Department if any problems should occur. ? ?Please show the CHEMOTHERAPY ALERT CARD or IMMUNOTHERAPY ALERT CARD at  check-in to the Emergency Department and triage nurse. ? ?Should you have questions after your visit or need to cancel or reschedule your appointment, please contact [facility]  Dept: [phone]  and follow the prompts.  Office hours are 8:00 a.m. to 4:30 p.m. Monday - Friday. Please note that voicemails left after 4:00 p.m. may not be returned until the following business day.  We are closed weekends and major holidays. You have access to a nurse at all times for urgent questions. Please call the main number to the clinic Dept: [phone] and follow the prompts. ? ? ?For any non-urgent questions, you may also contact your provider using MyChart. We now offer e-Visits for anyone 18 and older to request care online for non-urgent symptoms. For details visit [email or web address]. ?  ?Also download the MyChart app! Go to the app store, search "MyChart", open the app, select [facility], and log in with your MyChart username and password. ? ?Due to Covid, a mask is required upon entering the hospital/clinic. If you do not have a mask, one will be given to you upon arrival. For doctor visits, patients may have 1 support person aged 18 or older with them. For treatment visits, patients cannot have anyone with them due to current Covid guidelines and our immunocompromised population.  ? ?

## 2021-06-10 NOTE — Progress Notes (Signed)
Nutrition follow-up completed with patient diagnosed with ER negative breast cancer in August 2022 currently receiving Keytruda.  Weight decreased and documented as 131.1 pounds on February 28 decreased from 138 pounds January 17.  Patient has lost 19% body weight over 3 months which is severe/significant.  Labs include potassium 3.2 and glucose 118.  Patient reports her bowels are moving well.  She consumes approximately 1 carton of Ensure daily.  She is having difficulty with painful swallowing and taste alterations secondary to thrush.  She states that she knows she will be able to eat better once her thrush has improved.  Nutrition diagnosis: Unintended weight loss continues.  Intervention: Reviewed strategies for adding calories and protein and easy to tolerate soft foods and liquids. Encouraged increased Ensure complete or equivalent twice daily to 3 times daily between meals. Reviewed strategies for improving taste. Encourage patient to use baking soda and salt water rinses prior to eating. Nutrition fact sheet provided. Continue medications as prescribed by MD. Provided coupons.  Monitoring, evaluation, goals: Patient will work to increase calories and protein to minimize further weight loss and promote healing.  Next visit: April 11 during infusion.  **Disclaimer: This note was dictated with voice recognition software. Similar sounding words can inadvertently be transcribed and this note may contain transcription errors which may not have been corrected upon publication of note.**

## 2021-06-10 NOTE — Progress Notes (Signed)
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   She is here for C3 of immunotherapy, actually second cycle since surgery.  She is tolerating this quite well.   She only complains of severe thrush, painful swallowing and unable to appreciate or taste food and hence losing weight.  She is very frustrated by this ongoing thrush.  Besides thrush and fatigue, she denies any changes in her breathing, bowel habits or urinary habits.  She will be starting radiation sometime next week. Rest of the pertinent 10 point ROS reviewed and negative.   We are following her TSH since she is receiving Pembrolizumab, levels are noted below.  Lab Results  Component Value Date   TSH 1.960 05/20/2021   TSH 3.095 04/29/2021   TSH 1.548 04/22/2021   TSH 2.002 04/08/2021   TSH 2.604 03/18/2021    REVIEW OF SYSTEMS:  Rest of the pertinent 10 point ROS reviewed and negative except as mentioned above  COVID 19 VACCINATION STATUS: Robin Farley x2, most recently 11/2019; infection 07/2020   HISTORY OF CURRENT ILLNESS:   Cancer Staging  Malignant neoplasm of upper-outer quadrant of right breast in female, estrogen receptor negative ([place]) Staging form: Breast, AJCC 8th Edition - Clinical: Stage IIB (cT1c, cN1, cM0, G3, ER-,  PR-, HER2-) - Signed by [name], MD on 11/20/2020 Histologic grading system: 3 grade system - Pathologic stage from 05/08/2021: No Stage Recommended (ypT2, pN1a, cM0, G3, ER-, PR-, HER2-) - Signed by [name], NP on 05/21/2021 Stage prefix: Post-therapy Histologic grading system: 3 grade system  56 y.o. Robin Farley woman status post right breast upper outer quadrant biopsy 11/08/2020 for a clinical T1c N1, stage IIB invasive ductal carcinoma, grade 3, functionally triple negative, with an MIB-1 of 30%  (a) right breast retroareolar biopsy 12/09/2020 shows invasive ductal carcinoma, grade 2  (1) genetics test 12/03/2020 through the [facility] +RNAinsight Panel found no deleterious mutations in AIP, ALK, APC, ATM, AXIN2, BAP1, BARD1, BLM, BMPR1A, BRCA1, BRCA2, BRIP1, CDC73, CDH1, CDK4, CDKN1B, CDKN2A, CHEK2, CTNNA1, DICER1, FANCC, FH, FLCN, GALNT12, KIF1B, LZTR1, MAX, MEN1, MET, MLH1, MSH2, MSH3, MSH6, MUTYH, NBN, NF1, NF2, NTHL1, PALB2, PHOX2B, PMS2, POT1, PRKAR1A, PTCH1, PTEN, RAD51C, RAD51D, RB1, RECQL, RET, SDHA, SDHAF2, SDHB, SDHC, SDHD, SMAD4, SMARCA4, SMARCB1, SMARCE1, STK11, SUFU, TMEM127, TP53, TSC1, TSC2, VHL and XRCC2 (sequencing and deletion/duplication); EGFR, EGLN1, HOXB13, KIT, MITF, PDGFRA, POLD1, and POLE (sequencing only); EPCAM and GREM1 (deletion/duplication only).    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  Pathology showed 2.3 x 1.7 x 1.0 cm invasive ductal  carcinoma, grade 3 of 3, treatment effect was robust in the breast and  minimal in lymph nodes.  All surgical margins negative for invasive carcinoma, out of 4 examined the lymph nodes, 2 with micrometastasis and 0 with micrometastasis or isolated tumor cells.  Largest metastatic deposit in the lymph node was 4 mm and extranodal extension was present.  (4) adjuvant radiation to follow, she is anticipating starting radiation next week.   PAST MEDICAL HISTORY: Past Medical History:  Diagnosis Date   Abdominal pain, generalized 12/27/2008   Acute sinusitis, unspecified 02/26/2007   ADD 03/13/2010   ALLERGIC RHINITIS 02/23/2007   ANEMIA 02/05/2007   Anxiety 11/17/2011   ATTENTION DEFICIT DISORDER, HX OF 02/05/2007   Breast cancer ([place])    Deviated nasal septum 02/05/2007   Diabetes mellitus without complication ([place])    Family history of breast cancer 11/20/2020   Family history of melanoma 11/20/2020   FATIGUE 02/23/2007   HYPERLIPIDEMIA 02/23/2007   Personal history of malignant melanoma 11/20/2020   SWELLING MASS OR LUMP IN HEAD AND NECK 07/27/2008   UTI 02/23/2007    PAST SURGICAL HISTORY: Past Surgical History:  Procedure Laterality Date   BREAST RECONSTRUCTION WITH PLACEMENT OF TISSUE EXPANDER AND FLEX HD (ACELLULAR HYDRATED DERMIS) Right 05/08/2021   Procedure: BREAST RECONSTRUCTION WITH PLACEMENT OF TISSUE EXPANDER AND FLEX HD (ACELLULAR HYDRATED DERMIS);  Surgeon: Cindra Presume, MD;  Location: [facility];  Service: Plastics;  Laterality: Right;   BROW LIFT     CESAREAN SECTION     COSMETIC SURGERY  1995   [place]   MASTECTOMY W/ SENTINEL NODE BIOPSY Right 05/08/2021   Procedure: RIGHT MASTECTOMY WITH SENTINEL LYMPH NODE BIOPSY;  Surgeon: Donnie Mesa, MD;  Location: [facility];  Service: General;  Laterality: Right;   MELANOMA EXCISION WITH SENTINEL LYMPH NODE BIOPSY Left 10/24/2012   Procedure: MELANOMA wide EXCISION left lateral thigh WITH SENTINEL LYMPH NODE BIOPSY left groin;  Surgeon: Edward Jolly, MD;   Location: [facility];  Service: General;  Laterality: Left;   NASAL SEPTUM SURGERY     PORTACATH PLACEMENT Right 12/02/2020   Procedure: INSERTION PORT-A-CATH;  Surgeon: Donnie Mesa, MD;  Location: [facility];  Service: General;  Laterality: Right;   RADIOACTIVE SEED GUIDED AXILLARY SENTINEL LYMPH NODE Right 05/08/2021   Procedure: RADIOACTIVE SEED GUIDED AXILLARY SENTINEL LYMPH NODE DISSECTION;  Surgeon: Donnie Mesa, MD;  Location: [facility];  Service: General;  Laterality: Right;    FAMILY HISTORY: Family History  Problem Relation Age of Onset   Hypertension Mother    Breast cancer Mother 83   COPD Father    Atrial fibrillation Father    Atrial fibrillation Sister    Kidney cancer Maternal Aunt        dx 58s   Leukemia Paternal Aunt        dx after 70   Cancer Paternal Aunt        unknown type; dx after 55   Brain cancer Paternal Grandfather 73       brain tumor per pt   Melanoma Cousin        arm; no mets per pt   Alcohol abuse Other    Depression Other    Glaucoma Other    Breast cancer Other        MGM's sisters, x3, dx after 20   Colon polyps Neg Hx    Her father died at age 52 from COPD.  Her mother is currently 60 years old as of 11/2020. Niralya has no full siblings and three half-siblings-- two brothers and one sister. She reports breast cancer in her mother and three great-aunts, brain tumor in her paternal grandfather, and metastatic melanoma in a cousin.   GYNECOLOGIC HISTORY:  Patient's last menstrual period was 04/27/2016. Menarche: [age] years old Age at first live birth: 56 years old [place] P 2 LMP 2016 Contraceptive used for 1-2 years HRT estrogen cream irregularly Hysterectomy? no BSO? 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 She lives in [place], [place] but travels here frequently for work. Husband [name] works in Press photographer for [employer]. She  lives at home with [name] and their daughter [name], who is 28 and is a Ship broker at [employer]. Daughter [name], age 18, is a Secretary/administrator in [place]. [place], [place]. Kaloni is not a Designer, fashion/clothing.    ADVANCED DIRECTIVES: In the absence of any documentation to the contrary, the patient's spouse is their HCPOA.    HEALTH MAINTENANCE: Social History   Tobacco Use   Smoking status: Never   Smokeless tobacco: Never  Vaping Use   Vaping Use: Never used  Substance Use Topics   Alcohol use: Not Currently    Comment: Very rare occasions   Drug use: Never     Colonoscopy: 12/2014 (Dr. Henrene Pastor)  PAP: 2020  Bone density: unsure   Allergies  Allergen Reactions   Promethazine Hcl     Legs twitch    Current Outpatient Medications  Medication Sig Dispense Refill   atorvastatin (LIPITOR) 20 MG tablet Take 1 tablet (20 mg total) by mouth daily. 90 tablet 3   clotrimazole (MYCELEX) 10 MG troche Take 1 tablet (10 mg total) by mouth 5 (five) times daily. Start once someone calls you to commence this Rx. 140 Troche 0   fluconazole (DIFLUCAN) 200 MG tablet Take 1 tablet (200 mg total) by mouth daily. 30 tablet 1   magic mouthwash (nystatin, lidocaine, diphenhydrAMINE, alum & mag hydroxide) suspension Swish and spit 5 mls 4 times a day before meals and at bedtime as needed. 240 mL 3   metFORMIN (GLUCOPHAGE-XR) 500 MG 24 hr tablet Take 1 tablet (500 mg total) by mouth daily with breakfast. 90 tablet 3   ondansetron (ZOFRAN) 8 MG tablet Take 1 tablet (8 mg total) by mouth every 8 (eight) hours as needed for nausea or vomiting. 20 tablet 3   ondansetron (ZOFRAN-ODT) 4 MG disintegrating tablet Take 1 tablet (4 mg total) by mouth every 8 (eight) hours as needed for nausea or vomiting. 20 tablet 0   valACYclovir (VALTREX) 1000 MG tablet TAKE 1 TABLET BY MOUTH TWICE A DAY (Patient taking differently: Take 1,000 mg by mouth daily.) 60 tablet 1   No current facility-administered medications for this  visit.   Facility-Administered Medications Ordered in Other Visits  Medication Dose Route Frequency Provider Last Rate Last Admin   sodium chloride flush (NS) 0.9 % injection 10 mL  10 mL Intracatheter PRN Magrinat, Virgie Dad, MD   10 mL at 01/21/21 1144   sodium chloride flush (NS) 0.9 % injection 10 mL  10 mL Intracatheter PRN Magrinat, Virgie Dad, MD   10 mL at 03/20/21 1159    OBJECTIVE: White woman who appears younger than stated age  Vitals:   06/10/21 0851  BP: 123/61  Pulse: 95  Resp: 18  Temp: 97.7 F (36.5 C)  SpO2:  99%       Body mass index is 21.16 kg/m.   Wt Readings from Last 3 Encounters:  06/10/21 131 lb 1.6 oz (59.5 kg)  06/03/21 133 lb 6.4 oz (60.5 kg)  05/20/21 137 lb 3.2 oz (62.2 kg)     ECOG FS:1 - Symptomatic but completely ambulatory  Physical Exam Constitutional:      Appearance: Normal appearance.  HENT:     Head: Normocephalic and atraumatic.     Mouth/Throat:     Mouth: Mucous membranes are moist.     Comments: I do not appreciate severe candidiasis on my exam today.  I believe this is actually improving compared to my last visit. Cardiovascular:     Rate and Rhythm: Normal rate and regular rhythm.     Pulses: Normal pulses.     Heart sounds: Normal heart sounds.  Musculoskeletal:     Cervical back: Normal range of motion and neck supple. No rigidity.  Lymphadenopathy:     Cervical: No cervical adenopathy.  Skin:    General: Skin is warm and dry.  Neurological:     General: No focal deficit present.     Mental Status: She is alert.  Psychiatric:        Mood and Affect: Mood normal.     LAB RESULTS:  CMP     Component Value Date/Time   NA 134 (L) 05/20/2021 1108   K 4.0 05/20/2021 1108   CL 101 05/20/2021 1108   CO2 23 05/20/2021 1108   GLUCOSE 111 (H) 05/20/2021 1108   BUN 11 05/20/2021 1108   CREATININE 0.86 05/20/2021 1108   CREATININE 0.77 04/29/2021 0807   CALCIUM 9.8 05/20/2021 1108   PROT 7.1 05/20/2021 1108   ALBUMIN  4.3 05/20/2021 1108   AST 27 05/20/2021 1108   AST 13 (L) 04/29/2021 0807   ALT 21 05/20/2021 1108   ALT 9 04/29/2021 0807   ALKPHOS 96 05/20/2021 1108   BILITOT 0.3 05/20/2021 1108   BILITOT 0.3 04/29/2021 0807   GFRNONAA >60 05/20/2021 1108   GFRNONAA >60 04/29/2021 0807   GFRAA 100 03/23/2008 1212    No results found for: TOTALPROTELP, ALBUMINELP, A1GS, A2GS, BETS, BETA2SER, GAMS, MSPIKE, SPEI  Lab Results  Component Value Date   WBC 3.4 (L) 06/10/2021   NEUTROABS 2.4 06/10/2021   HGB 10.8 (L) 06/10/2021   HCT 32.0 (L) 06/10/2021   MCV 100.0 06/10/2021   PLT 185 06/10/2021    No results found for: LABCA2  No components found for: [id number]  No results for input(s): INR in the last 168 hours.  No results found for: LABCA2  No results found for: BWI203  No results found for: TDH741  No results found for: ULA453  No results found for: CA2729  No components found for: HGQUANT  No results found for: CEA1 / No results found for: CEA1   No results found for: AFPTUMOR  No results found for: CHROMOGRNA  No results found for: KPAFRELGTCHN, LAMBDASER, KAPLAMBRATIO (kappa/lambda light chains)  No results found for: HGBA, HGBA2QUANT, HGBFQUANT, HGBSQUAN (Hemoglobinopathy evaluation)   No results found for: LDH  Lab Results  Component Value Date   IRON 93 12/16/2018   IRONPCTSAT 25.6 12/16/2018   (Iron and TIBC)  No results found for: FERRITIN  Urinalysis    Component Value Date/Time   COLORURINE YELLOW 12/20/2019 1521   APPEARANCEUR CLEAR 12/20/2019 1521   LABSPEC 1.017 12/20/2019 1521   PHURINE 6.5 12/20/2019 1521   GLUCOSEU NEGATIVE  12/20/2019 [place] 12/16/2018 1512   [place] 12/20/2019 1521   BILIRUBINUR NEGATIVE 12/16/2018 1512   KETONESUR NEGATIVE 12/20/2019 1521   PROTEINUR NEGATIVE 12/20/2019 1521   UROBILINOGEN 0.2 12/16/2018 1512   NITRITE NEGATIVE 12/20/2019 1521   LEUKOCYTESUR NEGATIVE 12/20/2019 1521     STUDIES: No results found.   ELIGIBLE FOR AVAILABLE RESEARCH PROTOCOL: no  ASSESSMENT: 56 y.o. [place] woman with T1c N1, stage IIB invasive ductal carcinoma, grade 3, functionally triple negative, with an MIB-1 of 30%   (1) genetics test 12/03/2020 through the [facility] +RNAinsight Panel found no deleterious mutations in AIP, ALK, APC, ATM, AXIN2, BAP1, BARD1, BLM, BMPR1A, BRCA1, BRCA2, BRIP1, CDC73, CDH1, CDK4, CDKN1B, CDKN2A, CHEK2, CTNNA1, DICER1, FANCC, FH, FLCN, GALNT12, KIF1B, LZTR1, MAX, MEN1, MET, MLH1, MSH2, MSH3, MSH6, MUTYH, NBN, NF1, NF2, NTHL1, PALB2, PHOX2B, PMS2, POT1, PRKAR1A, PTCH1, PTEN, RAD51C, RAD51D, RB1, RECQL, RET, SDHA, SDHAF2, SDHB, SDHC, SDHD, SMAD4, SMARCA4, SMARCB1, SMARCE1, STK11, SUFU, TMEM127, TP53, TSC1, TSC2, VHL and XRCC2 (sequencing and deletion/duplication); EGFR, EGLN1, HOXB13, KIT, MITF, PDGFRA, POLD1, and POLE (sequencing only); EPCAM and GREM1 (deletion/duplication only).    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  She underwent right mastectomy and targeted lymph node dissection, final pathology showing tumor measuring 2.3 cm in largest dimension, grade 3, negative margins with excellent response in the breast however minimal response in the lymph node, 2 out of 4 lymph nodes positive for involvement, extranodal extension present, largest metastasis is 4 mm  PLAN:  Ms. Rhinehart is here for follow-up regarding adjuvant Keytruda a for you fter definitive surgery every 3 weeks for up to 9 cycles.  She has no adverse  effects reported with Keytruda.   In keynote 522, radiation was administered simultaneously with immunotherapy hence I have forwarded this article to Dr. Isidore Moos.  She will start radiation next week. After completion of radiation she will start on adjuvant endocrine therapy given weak ER positivity. Regarding thrush, continue clotrimazole and nystatin mouthwash.  I believe the the oral candidiasis appears better compared to my last exam but patient continues to have severe sore throat, lack of taste and is frustrated by the fact that she is not able to eat and she is continuing to lose weight.  I will also try to discuss with my ID colleagues to see if there is any additional recommendations. Leukopenia, mild, no neutropenia or lymphopenia. Okay to proceed with immunotherapy if labs are within parameters. Total time spent: 30 minutes.   *Total Encounter Time as defined by the Centers for Medicare and Medicaid Services includes, in addition to the face-to-face time of a patient visit (documented in the note above) non-face-to-face time: obtaining and reviewing outside history, ordering and reviewing medications, tests or procedures, care coordination (communications with other health care professionals or caregivers) and documentation in the medical record.

## 2021-06-12 ENCOUNTER — Other Ambulatory Visit: Payer: Self-pay

## 2021-06-12 ENCOUNTER — Ambulatory Visit
Admission: RE | Admit: 2021-06-12 | Discharge: 2021-06-12 | Disposition: A | Discharge status: Home / Self Care | Payer: Managed Care, Other (non HMO) | Source: Ambulatory Visit | Attending: Radiation Oncology | Admitting: Radiation Oncology

## 2021-06-12 ENCOUNTER — Ambulatory Visit (INDEPENDENT_AMBULATORY_CARE_PROVIDER_SITE_OTHER): Payer: Managed Care, Other (non HMO) | Admitting: Plastic Surgery

## 2021-06-12 DIAGNOSIS — C50811 Malignant neoplasm of overlapping sites of right female breast: Secondary | ICD-10-CM | POA: Insufficient documentation

## 2021-06-12 DIAGNOSIS — Z5112 Encounter for antineoplastic immunotherapy: Secondary | ICD-10-CM | POA: Diagnosis present

## 2021-06-12 DIAGNOSIS — Z171 Estrogen receptor negative status [ER-]: Secondary | ICD-10-CM

## 2021-06-12 DIAGNOSIS — Z9011 Acquired absence of right breast and nipple: Secondary | ICD-10-CM | POA: Diagnosis not present

## 2021-06-12 DIAGNOSIS — R519 Headache, unspecified: Secondary | ICD-10-CM | POA: Diagnosis not present

## 2021-06-12 DIAGNOSIS — Z7984 Long term (current) use of oral hypoglycemic drugs: Secondary | ICD-10-CM | POA: Diagnosis not present

## 2021-06-12 DIAGNOSIS — Z510 Encounter for antineoplastic radiation therapy: Secondary | ICD-10-CM | POA: Insufficient documentation

## 2021-06-12 DIAGNOSIS — C50411 Malignant neoplasm of upper-outer quadrant of right female breast: Secondary | ICD-10-CM | POA: Diagnosis present

## 2021-06-12 DIAGNOSIS — Z79899 Other long term (current) drug therapy: Secondary | ICD-10-CM | POA: Diagnosis not present

## 2021-06-12 NOTE — Progress Notes (Signed)
Patient presents for further tissue expansion.  She currently has 275 cc in her 375 cc expanders.  She would like 1 more fill before she starts radiation.  100 cc was infiltrated for a current volume of 375 cc in her 375 cc expander.  She is happy with the size.  She will plan to go start radiation and I will plan to see her around 4 months after that is finished plan the neck stage.  Looks like she will need a expander switch to an implant along with an augmentation mastopexy on the left side for symmetry.  All of her questions were answered we will plan to see her at that point. ?

## 2021-06-14 ENCOUNTER — Encounter: Payer: Self-pay | Admitting: Hematology and Oncology

## 2021-06-16 ENCOUNTER — Other Ambulatory Visit: Payer: Self-pay | Admitting: *Deleted

## 2021-06-16 ENCOUNTER — Encounter: Payer: Self-pay | Admitting: Hematology and Oncology

## 2021-06-16 ENCOUNTER — Encounter: Payer: Self-pay | Admitting: Oncology

## 2021-06-16 DIAGNOSIS — K1370 Unspecified lesions of oral mucosa: Secondary | ICD-10-CM

## 2021-06-18 DIAGNOSIS — Z5112 Encounter for antineoplastic immunotherapy: Secondary | ICD-10-CM | POA: Diagnosis not present

## 2021-06-19 ENCOUNTER — Encounter (HOSPITAL_COMMUNITY): Payer: Self-pay

## 2021-06-23 ENCOUNTER — Ambulatory Visit
Admission: RE | Admit: 2021-06-23 | Discharge: 2021-06-23 | Disposition: A | Discharge status: Home / Self Care | Payer: Managed Care, Other (non HMO) | Source: Ambulatory Visit | Attending: Radiation Oncology | Admitting: Radiation Oncology

## 2021-06-23 ENCOUNTER — Other Ambulatory Visit: Payer: Self-pay

## 2021-06-23 DIAGNOSIS — Z171 Estrogen receptor negative status [ER-]: Secondary | ICD-10-CM

## 2021-06-23 DIAGNOSIS — Z5112 Encounter for antineoplastic immunotherapy: Secondary | ICD-10-CM | POA: Diagnosis not present

## 2021-06-23 DIAGNOSIS — C50811 Malignant neoplasm of overlapping sites of right female breast: Secondary | ICD-10-CM

## 2021-06-23 DIAGNOSIS — C50411 Malignant neoplasm of upper-outer quadrant of right female breast: Secondary | ICD-10-CM

## 2021-06-23 MED ORDER — RADIAPLEXRX EX GEL: Freq: Once | CUTANEOUS | Status: AC

## 2021-06-23 NOTE — Progress Notes (Signed)
Pt here for patient teaching.   ? ?Pt given Radiation and You booklet, skin care instructions, and Radiaplex gel.  Alra deodorant remains on backorder (advised to use any OTC deodorant that is fragrance and aluminum free) ? ?Reviewed areas of pertinence such as fatigue, hair loss, skin changes, breast tenderness, and breast swelling .  ? ?Pt able to give teach back of to pat skin, use unscented/gentle soap, and drink plenty of water,apply Radiaplex bid, avoid applying anything to skin within 4 hours of treatment, avoid wearing an under wire bra, and to use an electric razor if they must shave.  ? ?Pt demonstrated understanding and verbalizes understanding of information given and will contact nursing with any questions or concerns.   ? ?Http://rtanswers.org/treatmentinformation/whattoexpect/index ?  ? ? ? ? ? ? ?

## 2021-06-24 ENCOUNTER — Ambulatory Visit
Admission: RE | Admit: 2021-06-24 | Discharge: 2021-06-24 | Disposition: A | Discharge status: Home / Self Care | Payer: Managed Care, Other (non HMO) | Source: Ambulatory Visit | Attending: Radiation Oncology | Admitting: Radiation Oncology

## 2021-06-24 ENCOUNTER — Other Ambulatory Visit: Payer: Self-pay

## 2021-06-24 DIAGNOSIS — Z5112 Encounter for antineoplastic immunotherapy: Secondary | ICD-10-CM | POA: Diagnosis not present

## 2021-06-25 ENCOUNTER — Ambulatory Visit
Admission: RE | Admit: 2021-06-25 | Discharge: 2021-06-25 | Disposition: A | Discharge status: Home / Self Care | Payer: Managed Care, Other (non HMO) | Source: Ambulatory Visit | Attending: Radiation Oncology | Admitting: Radiation Oncology

## 2021-06-25 DIAGNOSIS — Z5112 Encounter for antineoplastic immunotherapy: Secondary | ICD-10-CM | POA: Diagnosis not present

## 2021-06-26 ENCOUNTER — Other Ambulatory Visit: Payer: Self-pay

## 2021-06-26 ENCOUNTER — Ambulatory Visit
Admission: RE | Admit: 2021-06-26 | Discharge: 2021-06-26 | Disposition: A | Discharge status: Home / Self Care | Payer: Managed Care, Other (non HMO) | Source: Ambulatory Visit | Attending: Radiation Oncology | Admitting: Radiation Oncology

## 2021-06-26 DIAGNOSIS — Z5112 Encounter for antineoplastic immunotherapy: Secondary | ICD-10-CM | POA: Diagnosis not present

## 2021-06-27 ENCOUNTER — Telehealth: Payer: Self-pay

## 2021-06-27 ENCOUNTER — Ambulatory Visit
Admission: RE | Admit: 2021-06-27 | Discharge: 2021-06-27 | Disposition: A | Discharge status: Home / Self Care | Payer: Managed Care, Other (non HMO) | Source: Ambulatory Visit | Attending: Radiation Oncology | Admitting: Radiation Oncology

## 2021-06-27 DIAGNOSIS — Z5112 Encounter for antineoplastic immunotherapy: Secondary | ICD-10-CM | POA: Diagnosis not present

## 2021-06-27 NOTE — Telephone Encounter (Signed)
Called and left VM with [employer] Nurse Advocate [phone]) who had requested patient's radition start and finish dates. Provided my direct call back number should they need anything else for patient ?

## 2021-06-30 ENCOUNTER — Other Ambulatory Visit: Payer: Self-pay

## 2021-06-30 ENCOUNTER — Ambulatory Visit
Admission: RE | Admit: 2021-06-30 | Discharge: 2021-06-30 | Disposition: A | Discharge status: Home / Self Care | Payer: Managed Care, Other (non HMO) | Source: Ambulatory Visit | Attending: Radiation Oncology | Admitting: Radiation Oncology

## 2021-06-30 DIAGNOSIS — Z5112 Encounter for antineoplastic immunotherapy: Secondary | ICD-10-CM | POA: Diagnosis not present

## 2021-07-01 ENCOUNTER — Inpatient Hospital Stay (HOSPITAL_BASED_OUTPATIENT_CLINIC_OR_DEPARTMENT_OTHER): Payer: Managed Care, Other (non HMO) | Admitting: Hematology and Oncology

## 2021-07-01 ENCOUNTER — Ambulatory Visit
Admission: RE | Admit: 2021-07-01 | Discharge: 2021-07-01 | Disposition: A | Discharge status: Home / Self Care | Payer: Managed Care, Other (non HMO) | Source: Ambulatory Visit | Attending: Radiation Oncology | Admitting: Radiation Oncology

## 2021-07-01 ENCOUNTER — Inpatient Hospital Stay: Payer: Managed Care, Other (non HMO)

## 2021-07-01 ENCOUNTER — Encounter: Payer: Self-pay | Admitting: *Deleted

## 2021-07-01 ENCOUNTER — Inpatient Hospital Stay: Payer: Managed Care, Other (non HMO) | Attending: Oncology

## 2021-07-01 ENCOUNTER — Encounter: Payer: Self-pay | Admitting: Hematology and Oncology

## 2021-07-01 VITALS — BP 141/74 | HR 86 | Temp 97.5°F | Resp 16 | Ht 66.0 in | Wt 134.3 lb

## 2021-07-01 DIAGNOSIS — Z171 Estrogen receptor negative status [ER-]: Secondary | ICD-10-CM | POA: Diagnosis not present

## 2021-07-01 DIAGNOSIS — Z5112 Encounter for antineoplastic immunotherapy: Secondary | ICD-10-CM | POA: Insufficient documentation

## 2021-07-01 DIAGNOSIS — R519 Headache, unspecified: Secondary | ICD-10-CM | POA: Insufficient documentation

## 2021-07-01 DIAGNOSIS — C50411 Malignant neoplasm of upper-outer quadrant of right female breast: Secondary | ICD-10-CM

## 2021-07-01 DIAGNOSIS — Z79899 Other long term (current) drug therapy: Secondary | ICD-10-CM | POA: Insufficient documentation

## 2021-07-01 DIAGNOSIS — Z95828 Presence of other vascular implants and grafts: Secondary | ICD-10-CM

## 2021-07-01 DIAGNOSIS — Z510 Encounter for antineoplastic radiation therapy: Secondary | ICD-10-CM | POA: Insufficient documentation

## 2021-07-01 DIAGNOSIS — Z9011 Acquired absence of right breast and nipple: Secondary | ICD-10-CM | POA: Insufficient documentation

## 2021-07-01 DIAGNOSIS — C50811 Malignant neoplasm of overlapping sites of right female breast: Secondary | ICD-10-CM | POA: Insufficient documentation

## 2021-07-01 DIAGNOSIS — K1370 Unspecified lesions of oral mucosa: Secondary | ICD-10-CM | POA: Diagnosis not present

## 2021-07-01 DIAGNOSIS — Z7984 Long term (current) use of oral hypoglycemic drugs: Secondary | ICD-10-CM | POA: Insufficient documentation

## 2021-07-01 LAB — COMPREHENSIVE METABOLIC PANEL
ALT: 11 U/L (ref 0–44)
AST: 13 U/L — ABNORMAL LOW (ref 15–41)
Albumin: 3.9 g/dL (ref 3.5–5.0)
Alkaline Phosphatase: 65 U/L (ref 38–126)
Anion gap: 6 (ref 5–15)
BUN: 10 mg/dL (ref 6–20)
CO2: 27 mmol/L (ref 22–32)
Calcium: 9.4 mg/dL (ref 8.9–10.3)
Chloride: 110 mmol/L (ref 98–111)
Creatinine, Ser: 0.63 mg/dL (ref 0.44–1.00)
GFR, Estimated: >60 mL/min (ref >60)
Glucose, Bld: 98 mg/dL (ref 70–99)
Potassium: 3.6 mmol/L (ref 3.5–5.1)
Sodium: 143 mmol/L (ref 135–145)
Total Bilirubin: 0.4 mg/dL (ref 0.3–1.2)
Total Protein: 6.4 g/dL — ABNORMAL LOW (ref 6.5–8.1)

## 2021-07-01 LAB — CBC WITH DIFFERENTIAL/PLATELET
Abs Immature Granulocytes: 0 10*3/uL (ref 0.00–0.07)
Basophils Absolute: 0 10*3/uL (ref 0.0–0.1)
Basophils Relative: 0 %
Eosinophils Absolute: 0 10*3/uL (ref 0.0–0.5)
Eosinophils Relative: 0 %
HCT: 31.4 % — ABNORMAL LOW (ref 36.0–46.0)
Hemoglobin: 10.7 g/dL — ABNORMAL LOW (ref 12.0–15.0)
Immature Granulocytes: 0 %
Lymphocytes Relative: 22 %
Lymphs Abs: 0.6 10*3/uL — ABNORMAL LOW (ref 0.7–4.0)
MCH: 33.6 pg (ref 26.0–34.0)
MCHC: 34.1 g/dL (ref 30.0–36.0)
MCV: 98.7 fL (ref 80.0–100.0)
Monocytes Absolute: 0.3 10*3/uL (ref 0.1–1.0)
Monocytes Relative: 11 %
Neutro Abs: 1.9 10*3/uL (ref 1.7–7.7)
Neutrophils Relative %: 67 %
Platelets: 215 10*3/uL (ref 150–400)
RBC: 3.18 MIL/uL — ABNORMAL LOW (ref 3.87–5.11)
RDW: 12.7 % (ref 11.5–15.5)
WBC: 2.8 10*3/uL — ABNORMAL LOW (ref 4.0–10.5)
nRBC: 0 % (ref 0.0–0.2)

## 2021-07-01 LAB — CULTURE, FUNGUS WITHOUT SMEAR

## 2021-07-01 LAB — TSH: TSH: 3.33 u[IU]/mL (ref 0.308–3.960)

## 2021-07-01 MED ORDER — SODIUM CHLORIDE 0.9 % IV SOLN
200.0000 mg | Freq: Once | INTRAVENOUS | Status: AC
  Administered 2021-07-01: 200 mg via INTRAVENOUS
  Filled 2021-07-01: qty 200

## 2021-07-01 MED ORDER — SODIUM CHLORIDE 0.9% FLUSH
10.0000 mL | INTRAVENOUS | Status: DC | PRN
  Administered 2021-07-01: 10 mL via INTRAVENOUS

## 2021-07-01 MED ORDER — HEPARIN SOD (PORK) LOCK FLUSH 100 UNIT/ML IV SOLN
500.0000 [IU] | Freq: Once | INTRAVENOUS | Status: AC | PRN
  Administered 2021-07-01: 500 [IU]

## 2021-07-01 MED ORDER — SODIUM CHLORIDE 0.9 % IV SOLN: Freq: Once | INTRAVENOUS | Status: AC

## 2021-07-01 MED ORDER — SODIUM CHLORIDE 0.9% FLUSH
10.0000 mL | INTRAVENOUS | Status: DC | PRN
  Administered 2021-07-01: 10 mL

## 2021-07-01 NOTE — Patient Instructions (Signed)
[facility]   ?Discharge Instructions: ?Thank you for choosing [facility] to provide your oncology and hematology care.  ? ?If you have a lab appointment with the Cancer Center, please go directly to the Cancer Center and check in at the registration area. ?  ?Wear comfortable clothing and clothing appropriate for easy access to any Portacath or PICC line.  ? ?We strive to give you quality time with your provider. You may need to reschedule your appointment if you arrive late (15 or more minutes).  Arriving late affects you and other patients whose appointments are after yours.  Also, if you miss three or more appointments without notifying the office, you may be dismissed from the clinic at the provider?s discretion.    ?  ?For prescription refill requests, have your pharmacy contact our office and allow 72 hours for refills to be completed.   ? ?Today you received the following chemotherapy and/or immunotherapy agents: pembrolizumab    ?  ?To help prevent nausea and vomiting after your treatment, we encourage you to take your nausea medication as directed. ? ?BELOW ARE SYMPTOMS THAT SHOULD BE REPORTED IMMEDIATELY: ?*FEVER GREATER THAN 100.4 F (38 ?C) OR HIGHER ?*CHILLS OR SWEATING ?*NAUSEA AND VOMITING THAT IS NOT CONTROLLED WITH YOUR NAUSEA MEDICATION ?*UNUSUAL SHORTNESS OF BREATH ?*UNUSUAL BRUISING OR BLEEDING ?*URINARY PROBLEMS (pain or burning when urinating, or frequent urination) ?*BOWEL PROBLEMS (unusual diarrhea, constipation, pain near the anus) ?TENDERNESS IN MOUTH AND THROAT WITH OR WITHOUT PRESENCE OF ULCERS (sore throat, sores in mouth, or a toothache) ?UNUSUAL RASH, SWELLING OR PAIN  ?UNUSUAL VAGINAL DISCHARGE OR ITCHING  ? ?Items with * indicate a potential emergency and should be followed up as soon as possible or go to the Emergency Department if any problems should occur. ? ?Please show the CHEMOTHERAPY ALERT CARD or IMMUNOTHERAPY ALERT CARD at  check-in to the Emergency Department and triage nurse. ? ?Should you have questions after your visit or need to cancel or reschedule your appointment, please contact [facility]  Dept: [phone]  and follow the prompts.  Office hours are 8:00 a.m. to 4:30 p.m. Monday - Friday. Please note that voicemails left after 4:00 p.m. may not be returned until the following business day.  We are closed weekends and major holidays. You have access to a nurse at all times for urgent questions. Please call the main number to the clinic Dept: [phone] and follow the prompts. ? ? ?For any non-urgent questions, you may also contact your provider using MyChart. We now offer e-Visits for anyone 18 and older to request care online for non-urgent symptoms. For details visit [email or web address]. ?  ?Also download the MyChart app! Go to the app store, search "MyChart", open the app, select [facility], and log in with your MyChart username and password. ? ?Due to Covid, a mask is required upon entering the hospital/clinic. If you do not have a mask, one will be given to you upon arrival. For doctor visits, patients may have 1 support person aged 18 or older with them. For treatment visits, patients cannot have anyone with them due to current Covid guidelines and our immunocompromised population.  ? ?

## 2021-07-01 NOTE — Progress Notes (Signed)
?[place]  ?[phone] [phone]  ? ? ?ID: Robin Farley DOB: [birth year]  MR#: [id number]  ZRA#:[id number] ? ?Patient Care Team: ?[name], MD as PCP - General ?Rockwell Germany, RN as Oncology Nurse Navigator ?Mauro Kaufmann, RN as Oncology Nurse Navigator ?Donnie Mesa, MD as Consulting Physician (General Surgery) ?Brien Few, MD as Consulting Physician (Obstetrics and Gynecology) ?Benay Pike, MD as Consulting Physician (Hematology and Oncology) ?Benay Pike, MD ? 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 ?CURRENT TREATMENT: adjuvant immunotherapy ? ?INTERVAL HISTORY: ? 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   ?She is here for C4 of immunotherapy, third cycle since surgery.  She is tolerating this quite well.   ?Since last visit, she has seen ENT, was prescribed some topical steroids,doing much better ?She is able to eat and gain 3 lbs. ?She has new headaches, left maxilla and frontal region, wondering if this could be spring related ?No change in breathing, bowel habits or urinary habits ? ?Rest of the pertinent 10 point ROS reviewed and negative. ? ? ?We are following her TSH since she is receiving Pembrolizumab, levels are noted below.  ?Lab Results  ?Component Value Date  ? TSH 2.590 06/10/2021  ? TSH 1.960 05/20/2021  ? TSH 3.095 04/29/2021  ? TSH 1.548 04/22/2021  ? TSH 2.002 04/08/2021  ? ? ?REVIEW OF SYSTEMS: ? ?Rest of the pertinent 10 point ROS reviewed and negative except as mentioned above ? COVID 19 VACCINATION STATUS: [place] x2, most recently 11/2019; infection 07/2020 ? ? ?HISTORY OF CURRENT ILLNESS: ? ? Cancer Staging  ?Malignant neoplasm of upper-outer quadrant of right breast in female, estrogen receptor negative ([place]) ?Staging form: Breast, AJCC 8th Edition ?- Clinical: Stage IIB (cT1c, cN1, cM0, G3, ER-, PR-, HER2-) - Signed by [name], MD on  11/20/2020 ?Histologic grading system: 3 grade system ?- Pathologic stage from 05/08/2021: No Stage Recommended (ypT2, pN1a, cM0, G3, ER-, PR-, HER2-) - Signed by [name], NP on 05/21/2021 ?Stage prefix: Post-therapy ?Histologic grading system: 3 grade system ? ?56 y.o. Robin Farley woman status post right breast upper outer quadrant biopsy 11/08/2020 for a clinical T1c N1, stage IIB invasive ductal carcinoma, grade 3, functionally triple negative, with an MIB-1 of 30% ? (a) right breast retroareolar biopsy 12/09/2020 shows invasive ductal carcinoma, grade 2 ? ?(1) genetics test 12/03/2020 through the [facility] +RNAinsight Panel found no deleterious mutations in AIP, ALK, APC, ATM, AXIN2, BAP1, BARD1, BLM, BMPR1A, BRCA1, BRCA2, BRIP1, CDC73, CDH1, CDK4, CDKN1B, CDKN2A, CHEK2, CTNNA1, DICER1, FANCC, FH, FLCN, GALNT12, KIF1B, LZTR1, MAX, MEN1, MET, MLH1, MSH2, MSH3, MSH6, MUTYH, NBN, NF1, NF2, NTHL1, PALB2, PHOX2B, PMS2, POT1, PRKAR1A, PTCH1, PTEN, RAD51C, RAD51D, RB1, RECQL, RET, SDHA, SDHAF2, SDHB, SDHC, SDHD, SMAD4, SMARCA4, SMARCB1, SMARCE1, STK11, SUFU, TMEM127, TP53, TSC1, TSC2, VHL and XRCC2 (sequencing and deletion/duplication); EGFR, EGLN1, HOXB13, KIT, MITF, PDGFRA, POLD1, and POLE (sequencing only); EPCAM and GREM1 (deletion/duplication only).   ? ?(2) neoadjuvant chemotherapy consisting of carboplatin and paclitaxel with pembrolizumab every 21 days started 12/03/2020, completed 01/21/2021 (3 cycles) followed by doxorubicin and cyclophosphamide with pembrolizumab every 21 days x 4 starting 02/04/2021 ? 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 ?She had right mastectomy and targeted right axillary dissection on 05/08/2021.  Pathology showed 2.3 x 1.7 x 1.0 cm invasive ductal carcinoma, grade 3 of 3, treatment effect  was robust in the breast and minimal in lymph nodes.  All surgical margins  negative for invasive carcinoma, out of 4 examined the lymph nodes, 2 with micrometastasis and 0 with micrometastasis or isolated tumor cells.  Largest metastatic deposit in the lymph node was 4 mm and extranodal extension was present. ? ?(4) adjuvant radiation to follow, she is anticipating starting radiation next week. ? ? ?PAST MEDICAL HISTORY: ?Past Medical History:  ?Diagnosis Date  ? Abdominal pain, generalized 12/27/2008  ? Acute sinusitis, unspecified 02/26/2007  ? ADD 03/13/2010  ? ALLERGIC RHINITIS 02/23/2007  ? ANEMIA 02/05/2007  ? Anxiety 11/17/2011  ? ATTENTION DEFICIT DISORDER, HX OF 02/05/2007  ? Breast cancer ([place])   ? Deviated nasal septum 02/05/2007  ? Diabetes mellitus without complication ([place])   ? Family history of breast cancer 11/20/2020  ? Family history of melanoma 11/20/2020  ? FATIGUE 02/23/2007  ? HYPERLIPIDEMIA 02/23/2007  ? Personal history of malignant melanoma 11/20/2020  ? SWELLING MASS OR LUMP IN HEAD AND NECK 07/27/2008  ? UTI 02/23/2007  ? ? ?PAST SURGICAL HISTORY: ?Past Surgical History:  ?Procedure Laterality Date  ? BREAST RECONSTRUCTION WITH PLACEMENT OF TISSUE EXPANDER AND FLEX HD (ACELLULAR HYDRATED DERMIS) Right 05/08/2021  ? Procedure: BREAST RECONSTRUCTION WITH PLACEMENT OF TISSUE EXPANDER AND FLEX HD (ACELLULAR HYDRATED DERMIS);  Surgeon: Cindra Presume, MD;  Location: [facility];  Service: Plastics;  Laterality: Right;  ? BROW LIFT    ? CESAREAN SECTION    ? [place]  ? Nasal  ? MASTECTOMY W/ SENTINEL NODE BIOPSY Right 05/08/2021  ? Procedure: RIGHT MASTECTOMY WITH SENTINEL LYMPH NODE BIOPSY;  Surgeon: Donnie Mesa, MD;  Location: [facility];  Service: General;  Laterality: Right;  ? MELANOMA EXCISION WITH SENTINEL LYMPH NODE BIOPSY Left 10/24/2012  ? Procedure: MELANOMA wide EXCISION left lateral thigh WITH SENTINEL LYMPH NODE BIOPSY left groin;  Surgeon: Edward Jolly, MD;  Location: [facility];   Service: General;  Laterality: Left;  ? NASAL SEPTUM SURGERY    ? PORTACATH PLACEMENT Right 12/02/2020  ? Procedure: INSERTION PORT-A-CATH;  Surgeon: Donnie Mesa, MD;  Location: [facility];  Service: General;  Laterality: Right;  ? RADIOACTIVE SEED GUIDED AXILLARY SENTINEL LYMPH NODE Right 05/08/2021  ? Procedure: RADIOACTIVE SEED GUIDED AXILLARY SENTINEL LYMPH NODE DISSECTION;  Surgeon: Donnie Mesa, MD;  Location: [facility];  Service: General;  Laterality: Right;  ? ? ?FAMILY HISTORY: ?Family History  ?Problem Relation Age of Onset  ? Hypertension Mother   ? Breast cancer Mother 15  ? COPD Father   ? Atrial fibrillation Father   ? Atrial fibrillation Sister   ? Kidney cancer Maternal Aunt   ?     dx 65s  ? Leukemia Paternal Aunt   ?     dx after 70  ? Cancer Paternal Aunt   ?     unknown type; dx after 106  ? Brain cancer Paternal Grandfather 7  ?     brain tumor per pt  ? Melanoma Cousin   ?     arm; no mets per pt  ? Alcohol abuse Other   ? Depression Other   ? Glaucoma Other   ? Breast cancer Other   ?     MGM's sisters, x3, dx after 49  ? Colon polyps Neg Hx   ? Her father died at age 33 from COPD. Her mother is currently 61 years old  as of 11/2020. Saida has no full siblings and three half-siblings-- two brothers and one sister. She reports breast cancer in her mother and three great-aunts, brain tumor in her paternal grandfather, and metastatic melanoma in a cousin. ? ? ?GYNECOLOGIC HISTORY:  ?Patient's last menstrual period was 04/27/2016. ?Menarche: [age] years old ?Age at first live birth: 56 years old ?GX P 2 ?LMP 2016 ?Contraceptive used for 1-2 years ?HRT estrogen cream irregularly ?Hysterectomy? no ?BSO? no ? ? 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 She lives in [place], [place] but travels here frequently for work. Husband [name] works in Press photographer for [employer]. She lives at home with [name] and their  daughter [name], who is 6 and is a Ship broker at [employer]. Daughter [name], age 65, is a Secretary/administrator in [place]. [place], [place]. Onetta is not a Designer, fashion/clothing. ?  ? ADVANCED DIRECTIVES: In the absence

## 2021-07-02 ENCOUNTER — Ambulatory Visit: Admission: RE | Admit: 2021-07-02 | Payer: Managed Care, Other (non HMO) | Source: Ambulatory Visit

## 2021-07-03 ENCOUNTER — Ambulatory Visit
Admission: RE | Admit: 2021-07-03 | Discharge: 2021-07-03 | Disposition: A | Discharge status: Home / Self Care | Payer: Managed Care, Other (non HMO) | Source: Ambulatory Visit | Attending: Radiation Oncology | Admitting: Radiation Oncology

## 2021-07-03 DIAGNOSIS — Z5112 Encounter for antineoplastic immunotherapy: Secondary | ICD-10-CM | POA: Diagnosis not present

## 2021-07-04 ENCOUNTER — Other Ambulatory Visit: Payer: Self-pay

## 2021-07-04 ENCOUNTER — Ambulatory Visit
Admission: RE | Admit: 2021-07-04 | Discharge: 2021-07-04 | Disposition: A | Discharge status: Home / Self Care | Payer: Managed Care, Other (non HMO) | Source: Ambulatory Visit | Attending: Radiation Oncology | Admitting: Radiation Oncology

## 2021-07-04 DIAGNOSIS — Z5112 Encounter for antineoplastic immunotherapy: Secondary | ICD-10-CM | POA: Diagnosis not present

## 2021-07-07 ENCOUNTER — Other Ambulatory Visit: Payer: Self-pay

## 2021-07-07 ENCOUNTER — Ambulatory Visit
Admission: RE | Admit: 2021-07-07 | Discharge: 2021-07-07 | Disposition: A | Discharge status: Home / Self Care | Payer: Managed Care, Other (non HMO) | Source: Ambulatory Visit | Attending: Radiation Oncology | Admitting: Radiation Oncology

## 2021-07-07 DIAGNOSIS — Z5112 Encounter for antineoplastic immunotherapy: Secondary | ICD-10-CM | POA: Diagnosis not present

## 2021-07-08 ENCOUNTER — Ambulatory Visit
Admission: RE | Admit: 2021-07-08 | Discharge: 2021-07-08 | Disposition: A | Discharge status: Home / Self Care | Payer: Managed Care, Other (non HMO) | Source: Ambulatory Visit | Attending: Radiation Oncology | Admitting: Radiation Oncology

## 2021-07-08 ENCOUNTER — Other Ambulatory Visit: Payer: Self-pay

## 2021-07-08 DIAGNOSIS — Z5112 Encounter for antineoplastic immunotherapy: Secondary | ICD-10-CM | POA: Diagnosis not present

## 2021-07-09 ENCOUNTER — Ambulatory Visit
Admission: RE | Admit: 2021-07-09 | Discharge: 2021-07-09 | Disposition: A | Discharge status: Home / Self Care | Payer: Managed Care, Other (non HMO) | Source: Ambulatory Visit | Attending: Radiation Oncology | Admitting: Radiation Oncology

## 2021-07-09 DIAGNOSIS — Z5112 Encounter for antineoplastic immunotherapy: Secondary | ICD-10-CM | POA: Diagnosis not present

## 2021-07-10 ENCOUNTER — Ambulatory Visit
Admission: RE | Admit: 2021-07-10 | Discharge: 2021-07-10 | Disposition: A | Discharge status: Home / Self Care | Payer: Managed Care, Other (non HMO) | Source: Ambulatory Visit | Attending: Radiation Oncology | Admitting: Radiation Oncology

## 2021-07-10 DIAGNOSIS — Z5112 Encounter for antineoplastic immunotherapy: Secondary | ICD-10-CM | POA: Diagnosis not present

## 2021-07-11 ENCOUNTER — Other Ambulatory Visit: Payer: Self-pay

## 2021-07-11 ENCOUNTER — Ambulatory Visit
Admission: RE | Admit: 2021-07-11 | Discharge: 2021-07-11 | Disposition: A | Discharge status: Home / Self Care | Payer: Managed Care, Other (non HMO) | Source: Ambulatory Visit | Attending: Radiation Oncology | Admitting: Radiation Oncology

## 2021-07-11 DIAGNOSIS — Z5112 Encounter for antineoplastic immunotherapy: Secondary | ICD-10-CM | POA: Diagnosis not present

## 2021-07-14 ENCOUNTER — Other Ambulatory Visit: Payer: Self-pay

## 2021-07-14 ENCOUNTER — Ambulatory Visit
Admission: RE | Admit: 2021-07-14 | Discharge: 2021-07-14 | Disposition: A | Discharge status: Home / Self Care | Payer: Managed Care, Other (non HMO) | Source: Ambulatory Visit | Attending: Radiation Oncology | Admitting: Radiation Oncology

## 2021-07-14 DIAGNOSIS — Z7984 Long term (current) use of oral hypoglycemic drugs: Secondary | ICD-10-CM | POA: Diagnosis not present

## 2021-07-14 DIAGNOSIS — C50411 Malignant neoplasm of upper-outer quadrant of right female breast: Secondary | ICD-10-CM | POA: Diagnosis present

## 2021-07-14 DIAGNOSIS — Z171 Estrogen receptor negative status [ER-]: Secondary | ICD-10-CM | POA: Insufficient documentation

## 2021-07-14 DIAGNOSIS — Z510 Encounter for antineoplastic radiation therapy: Secondary | ICD-10-CM | POA: Insufficient documentation

## 2021-07-14 DIAGNOSIS — C50811 Malignant neoplasm of overlapping sites of right female breast: Secondary | ICD-10-CM | POA: Insufficient documentation

## 2021-07-14 DIAGNOSIS — Z9011 Acquired absence of right breast and nipple: Secondary | ICD-10-CM | POA: Diagnosis not present

## 2021-07-14 DIAGNOSIS — Z5112 Encounter for antineoplastic immunotherapy: Secondary | ICD-10-CM | POA: Diagnosis present

## 2021-07-14 DIAGNOSIS — Z79899 Other long term (current) drug therapy: Secondary | ICD-10-CM | POA: Diagnosis not present

## 2021-07-15 ENCOUNTER — Ambulatory Visit
Admission: RE | Admit: 2021-07-15 | Discharge: 2021-07-15 | Disposition: A | Discharge status: Home / Self Care | Payer: Managed Care, Other (non HMO) | Source: Ambulatory Visit | Attending: Radiation Oncology | Admitting: Radiation Oncology

## 2021-07-15 DIAGNOSIS — Z5112 Encounter for antineoplastic immunotherapy: Secondary | ICD-10-CM | POA: Diagnosis not present

## 2021-07-16 ENCOUNTER — Ambulatory Visit
Admission: RE | Admit: 2021-07-16 | Discharge: 2021-07-16 | Disposition: A | Discharge status: Home / Self Care | Payer: Managed Care, Other (non HMO) | Source: Ambulatory Visit | Attending: Radiation Oncology | Admitting: Radiation Oncology

## 2021-07-16 DIAGNOSIS — Z5112 Encounter for antineoplastic immunotherapy: Secondary | ICD-10-CM | POA: Diagnosis not present

## 2021-07-17 ENCOUNTER — Ambulatory Visit
Admission: RE | Admit: 2021-07-17 | Discharge: 2021-07-17 | Disposition: A | Discharge status: Home / Self Care | Payer: Managed Care, Other (non HMO) | Source: Ambulatory Visit | Attending: Radiation Oncology | Admitting: Radiation Oncology

## 2021-07-17 DIAGNOSIS — Z5112 Encounter for antineoplastic immunotherapy: Secondary | ICD-10-CM | POA: Diagnosis not present

## 2021-07-18 ENCOUNTER — Ambulatory Visit
Admission: RE | Admit: 2021-07-18 | Discharge: 2021-07-18 | Disposition: A | Discharge status: Home / Self Care | Payer: Managed Care, Other (non HMO) | Source: Ambulatory Visit | Attending: Radiation Oncology | Admitting: Radiation Oncology

## 2021-07-18 DIAGNOSIS — Z5112 Encounter for antineoplastic immunotherapy: Secondary | ICD-10-CM | POA: Diagnosis not present

## 2021-07-21 ENCOUNTER — Other Ambulatory Visit: Payer: Self-pay

## 2021-07-21 ENCOUNTER — Ambulatory Visit
Admission: RE | Admit: 2021-07-21 | Discharge: 2021-07-21 | Disposition: A | Discharge status: Home / Self Care | Payer: Managed Care, Other (non HMO) | Source: Ambulatory Visit | Attending: Radiation Oncology | Admitting: Radiation Oncology

## 2021-07-21 DIAGNOSIS — C50811 Malignant neoplasm of overlapping sites of right female breast: Secondary | ICD-10-CM

## 2021-07-21 DIAGNOSIS — Z5112 Encounter for antineoplastic immunotherapy: Secondary | ICD-10-CM | POA: Diagnosis not present

## 2021-07-21 MED ORDER — RADIAPLEXRX EX GEL: Freq: Once | CUTANEOUS | Status: AC

## 2021-07-22 ENCOUNTER — Encounter: Payer: Self-pay | Admitting: Hematology and Oncology

## 2021-07-22 ENCOUNTER — Inpatient Hospital Stay: Payer: Managed Care, Other (non HMO) | Admitting: Nutrition

## 2021-07-22 ENCOUNTER — Inpatient Hospital Stay: Payer: Managed Care, Other (non HMO)

## 2021-07-22 ENCOUNTER — Ambulatory Visit
Admission: RE | Admit: 2021-07-22 | Discharge: 2021-07-22 | Disposition: A | Discharge status: Home / Self Care | Payer: Managed Care, Other (non HMO) | Source: Ambulatory Visit | Attending: Radiation Oncology | Admitting: Radiation Oncology

## 2021-07-22 ENCOUNTER — Inpatient Hospital Stay: Payer: Managed Care, Other (non HMO) | Attending: Oncology

## 2021-07-22 ENCOUNTER — Inpatient Hospital Stay (HOSPITAL_BASED_OUTPATIENT_CLINIC_OR_DEPARTMENT_OTHER): Payer: Managed Care, Other (non HMO) | Admitting: Hematology and Oncology

## 2021-07-22 VITALS — BP 133/74 | HR 79 | Temp 97.3°F | Resp 18 | Ht 66.0 in | Wt 137.8 lb

## 2021-07-22 DIAGNOSIS — C50411 Malignant neoplasm of upper-outer quadrant of right female breast: Secondary | ICD-10-CM | POA: Insufficient documentation

## 2021-07-22 DIAGNOSIS — Z171 Estrogen receptor negative status [ER-]: Secondary | ICD-10-CM | POA: Diagnosis not present

## 2021-07-22 DIAGNOSIS — Z5112 Encounter for antineoplastic immunotherapy: Secondary | ICD-10-CM | POA: Insufficient documentation

## 2021-07-22 DIAGNOSIS — Z95828 Presence of other vascular implants and grafts: Secondary | ICD-10-CM

## 2021-07-22 DIAGNOSIS — Z510 Encounter for antineoplastic radiation therapy: Secondary | ICD-10-CM | POA: Insufficient documentation

## 2021-07-22 DIAGNOSIS — Z7984 Long term (current) use of oral hypoglycemic drugs: Secondary | ICD-10-CM | POA: Insufficient documentation

## 2021-07-22 DIAGNOSIS — Z79899 Other long term (current) drug therapy: Secondary | ICD-10-CM | POA: Insufficient documentation

## 2021-07-22 DIAGNOSIS — C50811 Malignant neoplasm of overlapping sites of right female breast: Secondary | ICD-10-CM | POA: Insufficient documentation

## 2021-07-22 DIAGNOSIS — Z9011 Acquired absence of right breast and nipple: Secondary | ICD-10-CM | POA: Insufficient documentation

## 2021-07-22 LAB — CBC WITH DIFFERENTIAL/PLATELET
Abs Immature Granulocytes: 0 10*3/uL (ref 0.00–0.07)
Basophils Absolute: 0 10*3/uL (ref 0.0–0.1)
Basophils Relative: 1 %
Eosinophils Absolute: 0 10*3/uL (ref 0.0–0.5)
Eosinophils Relative: 0 %
HCT: 31.9 % — ABNORMAL LOW (ref 36.0–46.0)
Hemoglobin: 10.8 g/dL — ABNORMAL LOW (ref 12.0–15.0)
Immature Granulocytes: 0 %
Lymphocytes Relative: 14 %
Lymphs Abs: 0.4 10*3/uL — ABNORMAL LOW (ref 0.7–4.0)
MCH: 33.1 pg (ref 26.0–34.0)
MCHC: 33.9 g/dL (ref 30.0–36.0)
MCV: 97.9 fL (ref 80.0–100.0)
Monocytes Absolute: 0.3 10*3/uL (ref 0.1–1.0)
Monocytes Relative: 12 %
Neutro Abs: 2 10*3/uL (ref 1.7–7.7)
Neutrophils Relative %: 73 %
Platelets: 192 10*3/uL (ref 150–400)
RBC: 3.26 MIL/uL — ABNORMAL LOW (ref 3.87–5.11)
RDW: 12.9 % (ref 11.5–15.5)
WBC: 2.7 10*3/uL — ABNORMAL LOW (ref 4.0–10.5)
nRBC: 0 % (ref 0.0–0.2)

## 2021-07-22 LAB — COMPREHENSIVE METABOLIC PANEL
ALT: 18 U/L (ref 0–44)
AST: 17 U/L (ref 15–41)
Albumin: 3.7 g/dL (ref 3.5–5.0)
Alkaline Phosphatase: 65 U/L (ref 38–126)
Anion gap: 6 (ref 5–15)
BUN: 14 mg/dL (ref 6–20)
CO2: 27 mmol/L (ref 22–32)
Calcium: 9.3 mg/dL (ref 8.9–10.3)
Chloride: 108 mmol/L (ref 98–111)
Creatinine, Ser: 0.66 mg/dL (ref 0.44–1.00)
GFR, Estimated: >60 mL/min (ref >60)
Glucose, Bld: 89 mg/dL (ref 70–99)
Potassium: 3.8 mmol/L (ref 3.5–5.1)
Sodium: 141 mmol/L (ref 135–145)
Total Bilirubin: 0.4 mg/dL (ref 0.3–1.2)
Total Protein: 6.1 g/dL — ABNORMAL LOW (ref 6.5–8.1)

## 2021-07-22 LAB — TSH: TSH: 2.777 u[IU]/mL (ref 0.308–3.960)

## 2021-07-22 MED ORDER — SODIUM CHLORIDE 0.9 % IV SOLN: Freq: Once | INTRAVENOUS | Status: AC

## 2021-07-22 MED ORDER — SODIUM CHLORIDE 0.9% FLUSH
10.0000 mL | Freq: Once | INTRAVENOUS | Status: AC | PRN
  Administered 2021-07-22: 10 mL

## 2021-07-22 MED ORDER — SODIUM CHLORIDE 0.9% FLUSH
10.0000 mL | INTRAVENOUS | Status: DC | PRN
  Administered 2021-07-22: 10 mL

## 2021-07-22 MED ORDER — SODIUM CHLORIDE 0.9 % IV SOLN
200.0000 mg | Freq: Once | INTRAVENOUS | Status: AC
  Administered 2021-07-22: 200 mg via INTRAVENOUS
  Filled 2021-07-22: qty 200

## 2021-07-22 MED ORDER — HEPARIN SOD (PORK) LOCK FLUSH 100 UNIT/ML IV SOLN
500.0000 [IU] | Freq: Once | INTRAVENOUS | Status: AC | PRN
  Administered 2021-07-22: 500 [IU]

## 2021-07-22 NOTE — Progress Notes (Signed)
Brief nutrition follow up completed with patient during infusion for ER Negative Breast Cancer. ? ?Weight improved and documented as 137 pounds, 12.8 oz. Improved from 131.1 pounds on February 28. ? ?Patient feels well. ?Her thrush is resolved and she states she is eating well.  ?She uses coconut oil in her mouth with good tolerance. ?She denies nutrition impact symptoms. ? ?Nutrition Diagnosis of Unintended weight loss resolved. ? ?Encouraged patient to contact RD with questions or concerns. ? ?

## 2021-07-22 NOTE — Progress Notes (Signed)
?[place]  ?Telephone:(336) [phone] [phone]  ? ? ?ID: ARIELIS LEONHART DOB: [birth year]  MR#: [id number]  ONG#:[id number] ? ?Patient Care Team: ?[name], MD as PCP - General ?Rockwell Germany, RN as Oncology Nurse Navigator ?Mauro Kaufmann, RN as Oncology Nurse Navigator ?Donnie Mesa, MD as Consulting Physician (General Surgery) ?Brien Few, MD as Consulting Physician (Obstetrics and Gynecology) ?Benay Pike, MD as Consulting Physician (Hematology and Oncology) ?Benay Pike, MD ? 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 ?CURRENT TREATMENT: adjuvant immunotherapy ? ?INTERVAL HISTORY: ? 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   ?She is here for follow-up on adjuvant immunotherapy.  She is tolerating this quite well.  Since her last visit, her oral rash has continued to resolve and she is now able to eat well, states food and continues to gain weight.  She is very pleased with the improvement.  She otherwise denies any changes in breathing, bowel habits or urinary habits.  No new neurological complaints. ?Rest of the pertinent 10 point ROS reviewed and negative. ? ? ?We are following her TSH since she is receiving Pembrolizumab, levels are noted below.  ?Lab Results  ?Component Value Date  ? TSH 3.330 07/01/2021  ? TSH 2.590 06/10/2021  ? TSH 1.960 05/20/2021  ? TSH 3.095 04/29/2021  ? TSH 1.548 04/22/2021  ? ? ?REVIEW OF SYSTEMS: ? ?Rest of the pertinent 10 point ROS reviewed and negative except as mentioned above ? COVID 19 VACCINATION STATUS: [place] x2, most recently 11/2019; infection 07/2020 ? ? ?HISTORY OF CURRENT ILLNESS: ? ? Cancer Staging  ?Malignant neoplasm of upper-outer quadrant of right breast in female, estrogen receptor negative ([place]) ?Staging form: Breast, AJCC 8th Edition ?- Clinical: Stage IIB (cT1c, cN1, cM0, G3, ER-, PR-, HER2-) - Signed by [name], MD on  11/20/2020 ?Histologic grading system: 3 grade system ?- Pathologic stage from 05/08/2021: No Stage Recommended (ypT2, pN1a, cM0, G3, ER-, PR-, HER2-) - Signed by [name], NP on 05/21/2021 ?Stage prefix: Post-therapy ?Histologic grading system: 3 grade system ? ?56 y.o. [place] woman status post right breast upper outer quadrant biopsy 11/08/2020 for a clinical T1c N1, stage IIB invasive ductal carcinoma, grade 3, functionally triple negative, with an MIB-1 of 30% ? (a) right breast retroareolar biopsy 12/09/2020 shows invasive ductal carcinoma, grade 2 ? ?(1) genetics test 12/03/2020 through the [facility] +RNAinsight Panel found no deleterious mutations in AIP, ALK, APC, ATM, AXIN2, BAP1, BARD1, BLM, BMPR1A, BRCA1, BRCA2, BRIP1, CDC73, CDH1, CDK4, CDKN1B, CDKN2A, CHEK2, CTNNA1, DICER1, FANCC, FH, FLCN, GALNT12, KIF1B, LZTR1, MAX, MEN1, MET, MLH1, MSH2, MSH3, MSH6, MUTYH, NBN, NF1, NF2, NTHL1, PALB2, PHOX2B, PMS2, POT1, PRKAR1A, PTCH1, PTEN, RAD51C, RAD51D, RB1, RECQL, RET, SDHA, SDHAF2, SDHB, SDHC, SDHD, SMAD4, SMARCA4, SMARCB1, SMARCE1, STK11, SUFU, TMEM127, TP53, TSC1, TSC2, VHL and XRCC2 (sequencing and deletion/duplication); EGFR, EGLN1, HOXB13, KIT, MITF, PDGFRA, POLD1, and POLE (sequencing only); EPCAM and GREM1 (deletion/duplication only).   ? ?(2) neoadjuvant chemotherapy consisting of carboplatin and paclitaxel with pembrolizumab every 21 days started 12/03/2020, completed 01/21/2021 (3 cycles) followed by doxorubicin and cyclophosphamide with pembrolizumab every 21 days x 4 starting 02/04/2021 ? 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 ?She had right mastectomy and targeted right axillary dissection on 05/08/2021.  Pathology showed 2.3 x 1.7 x 1.0 cm invasive ductal carcinoma, grade 3 of 3, treatment effect was  robust in the breast and minimal in lymph nodes.  All surgical margins  negative for invasive carcinoma, out of 4 examined the lymph nodes, 2 with micrometastasis and 0 with micrometastasis or isolated tumor cells.  Largest metastatic deposit in the lymph node was 4 mm and extranodal extension was present. ? ?She is now undergoing adjuvant radiation. ? ? ?PAST MEDICAL HISTORY: ?Past Medical History:  ?Diagnosis Date  ? Abdominal pain, generalized 12/27/2008  ? Acute sinusitis, unspecified 02/26/2007  ? ADD 03/13/2010  ? ALLERGIC RHINITIS 02/23/2007  ? ANEMIA 02/05/2007  ? Anxiety 11/17/2011  ? ATTENTION DEFICIT DISORDER, HX OF 02/05/2007  ? Breast cancer ([place])   ? Deviated nasal septum 02/05/2007  ? Diabetes mellitus without complication ([place])   ? Family history of breast cancer 11/20/2020  ? Family history of melanoma 11/20/2020  ? FATIGUE 02/23/2007  ? HYPERLIPIDEMIA 02/23/2007  ? Personal history of malignant melanoma 11/20/2020  ? SWELLING MASS OR LUMP IN HEAD AND NECK 07/27/2008  ? UTI 02/23/2007  ? ? ?PAST SURGICAL HISTORY: ?Past Surgical History:  ?Procedure Laterality Date  ? BREAST RECONSTRUCTION WITH PLACEMENT OF TISSUE EXPANDER AND FLEX HD (ACELLULAR HYDRATED DERMIS) Right 05/08/2021  ? Procedure: BREAST RECONSTRUCTION WITH PLACEMENT OF TISSUE EXPANDER AND FLEX HD (ACELLULAR HYDRATED DERMIS);  Surgeon: Cindra Presume, MD;  Location: [facility];  Service: Plastics;  Laterality: Right;  ? BROW LIFT    ? CESAREAN SECTION    ? [place]  ? Nasal  ? MASTECTOMY W/ SENTINEL NODE BIOPSY Right 05/08/2021  ? Procedure: RIGHT MASTECTOMY WITH SENTINEL LYMPH NODE BIOPSY;  Surgeon: Donnie Mesa, MD;  Location: [facility];  Service: General;  Laterality: Right;  ? MELANOMA EXCISION WITH SENTINEL LYMPH NODE BIOPSY Left 10/24/2012  ? Procedure: MELANOMA wide EXCISION left lateral thigh WITH SENTINEL LYMPH NODE BIOPSY left groin;  Surgeon: Edward Jolly, MD;  Location: [facility];  Service: General;  Laterality: Left;  ? NASAL  SEPTUM SURGERY    ? PORTACATH PLACEMENT Right 12/02/2020  ? Procedure: INSERTION PORT-A-CATH;  Surgeon: Donnie Mesa, MD;  Location: [facility];  Service: General;  Laterality: Right;  ? RADIOACTIVE SEED GUIDED AXILLARY SENTINEL LYMPH NODE Right 05/08/2021  ? Procedure: RADIOACTIVE SEED GUIDED AXILLARY SENTINEL LYMPH NODE DISSECTION;  Surgeon: Donnie Mesa, MD;  Location: [facility];  Service: General;  Laterality: Right;  ? ? ?FAMILY HISTORY: ?Family History  ?Problem Relation Age of Onset  ? Hypertension Mother   ? Breast cancer Mother 49  ? COPD Father   ? Atrial fibrillation Father   ? Atrial fibrillation Sister   ? Kidney cancer Maternal Aunt   ?     dx 61s  ? Leukemia Paternal Aunt   ?     dx after 14  ? Cancer Paternal Aunt   ?     unknown type; dx after 41  ? Brain cancer Paternal Grandfather 21  ?     brain tumor per pt  ? Melanoma Cousin   ?     arm; no mets per pt  ? Alcohol abuse Other   ? Depression Other   ? Glaucoma Other   ? Breast cancer Other   ?     MGM's sisters, x3, dx after 11  ? Colon polyps Neg Hx   ? Her father died at age 72 from COPD. Her mother is currently 55 years old as of 11/2020. Aleeya has no full  siblings and three half-siblings-- two brothers and one sister. She reports breast cancer in her mother and three great-aunts, brain tumor in her paternal grandfather, and metastatic melanoma in a cousin. ? ? ?GYNECOLOGIC HISTORY:  ?Patient's last menstrual period was 04/27/2016. ?Menarche: [age] years old ?Age at first live birth: 56 years old ?GX P 2 ?LMP 2016 ?Contraceptive used for 1-2 years ?HRT estrogen cream irregularly ?Hysterectomy? no ?BSO? no ? ? 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 Husband [name] works in Press photographer for [employer]. She lives at home with [name] and their daughter [name], who is 1 and is a Ship broker at  [employer]. Daughter [name], age 75, is a Secretary/administrator in [place]. [place], [place]. Daviona is not a Designer, fashion/clothing. ?  ? ADVANCED DIRECTIVES: In the absence of any documentation to the contrary, the pati

## 2021-07-22 NOTE — Patient Instructions (Signed)
[facility]  Discharge Instructions: ?Thank you for choosing [facility] to provide your oncology and hematology care.  ? ?If you have a lab appointment with the [facility], please go directly to the [facility] and check in at the registration area. ?  ?Wear comfortable clothing and clothing appropriate for easy access to any Portacath or PICC line.  ? ?We strive to give you quality time with your provider. You may need to reschedule your appointment if you arrive late (15 or more minutes).  Arriving late affects you and other patients whose appointments are after yours.  Also, if you miss three or more appointments without notifying the office, you may be dismissed from the clinic at the provider?s discretion.    ?  ?For prescription refill requests, have your pharmacy contact our office and allow 72 hours for refills to be completed.   ? ?Today you received the following chemotherapy and/or immunotherapy agent: Pembrolizumab (Keytruda). ?  ?To help prevent nausea and vomiting after your treatment, we encourage you to take your nausea medication as directed. ? ?BELOW ARE SYMPTOMS THAT SHOULD BE REPORTED IMMEDIATELY: ?*FEVER GREATER THAN 100.4 F (38 ?C) OR HIGHER ?*CHILLS OR SWEATING ?*NAUSEA AND VOMITING THAT IS NOT CONTROLLED WITH YOUR NAUSEA MEDICATION ?*UNUSUAL SHORTNESS OF BREATH ?*UNUSUAL BRUISING OR BLEEDING ?*URINARY PROBLEMS (pain or burning when urinating, or frequent urination) ?*BOWEL PROBLEMS (unusual diarrhea, constipation, pain near the anus) ?TENDERNESS IN MOUTH AND THROAT WITH OR WITHOUT PRESENCE OF ULCERS (sore throat, sores in mouth, or a toothache) ?UNUSUAL RASH, SWELLING OR PAIN  ?UNUSUAL VAGINAL DISCHARGE OR ITCHING  ? ?Items with * indicate a potential emergency and should be followed up as soon as possible or go to the Emergency Department if any problems should occur. ? ?Please show the CHEMOTHERAPY ALERT CARD or IMMUNOTHERAPY ALERT CARD at  check-in to the Emergency Department and triage nurse. ? ?Should you have questions after your visit or need to cancel or reschedule your appointment, please contact [facility]  Dept: [phone]  and follow the prompts.  Office hours are 8:00 a.m. to 4:30 p.m. Monday - Friday. Please note that voicemails left after 4:00 p.m. may not be returned until the following business day.  We are closed weekends and major holidays. You have access to a nurse at all times for urgent questions. Please call the main number to the clinic Dept: [phone] and follow the prompts. ? ? ?For any non-urgent questions, you may also contact your provider using MyChart. We now offer e-Visits for anyone 31 and older to request care online for non-urgent symptoms. For details visit [email or web address]. ?  ?Also download the MyChart app! Go to the app store, search "MyChart", open the app, select [facility], and log in with your MyChart username and password. ? ?Due to Covid, a mask is required upon entering the hospital/clinic. If you do not have a mask, one will be given to you upon arrival. For doctor visits, patients may have 1 support person aged 49 or older with them. For treatment visits, patients cannot have anyone with them due to current Covid guidelines and our immunocompromised population.  ? ?Rehydration, Adult ?Rehydration is the replacement of body fluids, salts, and minerals (electrolytes) that are lost during dehydration. Dehydration is when there is not enough water or other fluids in the body. This happens when you lose more fluids than you take in. Common causes of dehydration include: ?Not drinking enough  fluids. This can occur when you are ill or doing activities that require a lot of energy, especially in hot weather. ?Conditions that cause loss of water or other fluids, such as diarrhea, vomiting, sweating, or urinating a lot. ?Other illnesses, such as fever or  infection. ?Certain medicines, such as those that remove excess fluid from the body (diuretics). ?Symptoms of mild or moderate dehydration may include thirst, dry lips and mouth, and dizziness. Symptoms of severe dehydration may include increased heart rate, confusion, fainting, and not urinating. ?For severe dehydration, you may need to get fluids through an IV at the hospital. For mild or moderate dehydration, you can usually rehydrate at home by drinking certain fluids as told by your health care provider. ?What are the risks? ?Generally, rehydration is safe. However, taking in too much fluid (overhydration) can be a problem. This is rare. Overhydration can cause an electrolyte imbalance, kidney failure, or a decrease in salt (sodium) levels in the body. ?Supplies needed ?You will need an oral rehydration solution (ORS) if your health care provider tells you to use one. This is a drink to treat dehydration. It can be found in pharmacies and retail stores. ?How to rehydrate ?Fluids ?Follow instructions from your health care provider for rehydration. The kind of fluid and the amount you should drink depend on your condition. In general, you should choose drinks that you prefer. ?If told by your health care provider, drink an ORS. ?Make an ORS by following instructions on the package. ?Start by drinking small amounts, about ? cup (120 mL) every 5-10 minutes. ?Slowly increase how much you drink until you have taken the amount recommended by your health care provider. ?Drink enough clear fluids to keep your urine pale yellow. If you were told to drink an ORS, finish it first, then start slowly drinking other clear fluids. Drink fluids such as: ?Water. This includes sparkling water and flavored water. Drinking only water can lead to having too little sodium in your body (hyponatremia). Follow the advice of your health care provider. ?Water from ice chips you suck on. ?Fruit juice with water you add to it  (diluted). ?Sports drinks. ?Hot or cold herbal teas. ?Broth-based soups. ?Milk or milk products. ?Food ?Follow instructions from your health care provider about what to eat while you rehydrate. Your health care provider may recommend that you slowly begin eating regular foods in small amounts. ?Eat foods that contain a healthy balance of electrolytes, such as bananas, oranges, potatoes, tomatoes, and spinach. ?Avoid foods that are greasy or contain a lot of sugar. ?In some cases, you may get nutrition through a feeding tube that is passed through your nose and into your stomach (nasogastric tube, or NG tube). This may be done if you have uncontrolled vomiting or diarrhea. ?Beverages to avoid ?Certain beverages may make dehydration worse. While you rehydrate, avoid drinking alcohol. ?How to tell if you are recovering from dehydration ?You may be recovering from dehydration if: ?You are urinating more often than before you started rehydrating. ?Your urine is pale yellow. ?Your energy level improves. ?You vomit less frequently. ?You have diarrhea less frequently. ?Your appetite improves or returns to normal. ?You feel less dizzy or less light-headed. ?Your skin tone and color start to look more normal. ?Follow these instructions at home: ?Take over-the-counter and prescription medicines only as told by your health care provider. ?Do not take sodium tablets. Doing this can lead to having too much sodium in your body (hypernatremia). ?Contact a health care provider  if: ?You continue to have symptoms of mild or moderate dehydration, such as: ?Thirst. ?Dry lips. ?Slightly dry mouth. ?Dizziness. ?Dark urine or less urine than normal. ?Muscle cramps. ?You continue to vomit or have diarrhea. ?Get help right away if you: ?Have symptoms of dehydration that get worse. ?Have a fever. ?Have a severe headache. ?Have been vomiting and the following happens: ?Your vomiting gets worse or does not go away. ?Your vomit includes blood or  green matter (bile). ?You cannot eat or drink without vomiting. ?Have problems with urination or bowel movements, such as: ?Diarrhea that gets worse or does not go away. ?Blood in your stool (feces). This may cause stool to look bl

## 2021-07-23 ENCOUNTER — Ambulatory Visit
Admission: RE | Admit: 2021-07-23 | Discharge: 2021-07-23 | Disposition: A | Discharge status: Home / Self Care | Payer: Managed Care, Other (non HMO) | Source: Ambulatory Visit | Attending: Radiation Oncology | Admitting: Radiation Oncology

## 2021-07-23 DIAGNOSIS — Z5112 Encounter for antineoplastic immunotherapy: Secondary | ICD-10-CM | POA: Diagnosis not present

## 2021-07-24 ENCOUNTER — Other Ambulatory Visit: Payer: Self-pay

## 2021-07-24 ENCOUNTER — Ambulatory Visit
Admission: RE | Admit: 2021-07-24 | Discharge: 2021-07-24 | Disposition: A | Discharge status: Home / Self Care | Payer: Managed Care, Other (non HMO) | Source: Ambulatory Visit | Attending: Radiation Oncology | Admitting: Radiation Oncology

## 2021-07-24 DIAGNOSIS — Z5112 Encounter for antineoplastic immunotherapy: Secondary | ICD-10-CM | POA: Diagnosis not present

## 2021-07-25 ENCOUNTER — Ambulatory Visit
Admission: RE | Admit: 2021-07-25 | Discharge: 2021-07-25 | Disposition: A | Discharge status: Home / Self Care | Payer: Managed Care, Other (non HMO) | Source: Ambulatory Visit | Attending: Radiation Oncology | Admitting: Radiation Oncology

## 2021-07-25 ENCOUNTER — Other Ambulatory Visit: Payer: Self-pay

## 2021-07-25 DIAGNOSIS — Z5112 Encounter for antineoplastic immunotherapy: Secondary | ICD-10-CM | POA: Diagnosis not present

## 2021-07-28 ENCOUNTER — Other Ambulatory Visit: Payer: Self-pay | Admitting: Internal Medicine

## 2021-07-28 ENCOUNTER — Ambulatory Visit
Admission: RE | Admit: 2021-07-28 | Discharge: 2021-07-28 | Disposition: A | Discharge status: Home / Self Care | Payer: Managed Care, Other (non HMO) | Source: Ambulatory Visit | Attending: Radiation Oncology | Admitting: Radiation Oncology

## 2021-07-28 DIAGNOSIS — Z5112 Encounter for antineoplastic immunotherapy: Secondary | ICD-10-CM | POA: Diagnosis not present

## 2021-07-28 NOTE — Telephone Encounter (Signed)
Please refill as per office routine med refill policy (all routine meds to be refilled for 3 mo or monthly (per pt preference) up to one year from last visit, then month to month grace period for 3 mo, then further med refills will have to be denied) ? ?

## 2021-07-29 ENCOUNTER — Ambulatory Visit
Admission: RE | Admit: 2021-07-29 | Discharge: 2021-07-29 | Disposition: A | Discharge status: Home / Self Care | Payer: Managed Care, Other (non HMO) | Source: Ambulatory Visit | Attending: Radiation Oncology | Admitting: Radiation Oncology

## 2021-07-29 ENCOUNTER — Telehealth: Payer: Self-pay

## 2021-07-29 ENCOUNTER — Other Ambulatory Visit: Payer: Self-pay

## 2021-07-29 ENCOUNTER — Encounter: Payer: Self-pay | Admitting: *Deleted

## 2021-07-29 DIAGNOSIS — Z5112 Encounter for antineoplastic immunotherapy: Secondary | ICD-10-CM | POA: Diagnosis not present

## 2021-07-29 LAB — RAD ONC ARIA SESSION SUMMARY
Course Elapsed Days: 36
Plan Fractions Treated to Date: 13
Plan Fractions Treated to Date: 26
Plan Prescribed Dose Per Fraction: 1.8 Gy
Plan Prescribed Dose Per Fraction: 1.8 Gy
Plan Total Fractions Prescribed: 14
Plan Total Fractions Prescribed: 28
Plan Total Prescribed Dose: 25.2 Gy
Plan Total Prescribed Dose: 50.4 Gy
Reference Point Dosage Given to Date: 46.8 Gy
Reference Point Dosage Given to Date: 46.8 Gy
Reference Point Session Dosage Given: 1.8 Gy
Reference Point Session Dosage Given: 1.8 Gy
Session Number: 26

## 2021-07-29 MED ORDER — AMPHETAMINE-DEXTROAMPHETAMINE 20 MG PO TABS: ORAL_TABLET | ORAL | 0 refills | Status: DC

## 2021-07-29 NOTE — Telephone Encounter (Signed)
Ok done erx 

## 2021-07-29 NOTE — Telephone Encounter (Signed)
Pt is requesting a refill on: ?amphetamine-dextroamphetamine (ADDERALL) 20 MG tablet   ?Sig: Take 1 and 1/2 tablet by mouth twice per day ? ?Medication is on the D/C med list. While undergoing Chemo she wasn't taking it but will be going back to work and will need to resume it. ? ?Pharmacy: ?[facility], [place] ? ?LOV 06/10/20 ?ROV 08/01/21 ?

## 2021-07-30 ENCOUNTER — Ambulatory Visit
Admission: RE | Admit: 2021-07-30 | Discharge: 2021-07-30 | Disposition: A | Discharge status: Home / Self Care | Payer: Managed Care, Other (non HMO) | Source: Ambulatory Visit | Attending: Radiation Oncology | Admitting: Radiation Oncology

## 2021-07-30 ENCOUNTER — Ambulatory Visit: Payer: Managed Care, Other (non HMO)

## 2021-07-30 ENCOUNTER — Other Ambulatory Visit: Payer: Self-pay

## 2021-07-30 DIAGNOSIS — Z171 Estrogen receptor negative status [ER-]: Secondary | ICD-10-CM

## 2021-07-30 DIAGNOSIS — Z5112 Encounter for antineoplastic immunotherapy: Secondary | ICD-10-CM | POA: Diagnosis not present

## 2021-07-30 LAB — RAD ONC ARIA SESSION SUMMARY
Course Elapsed Days: 37
Plan Fractions Treated to Date: 14
Plan Fractions Treated to Date: 27
Plan Prescribed Dose Per Fraction: 1.8 Gy
Plan Prescribed Dose Per Fraction: 1.8 Gy
Plan Total Fractions Prescribed: 14
Plan Total Fractions Prescribed: 28
Plan Total Prescribed Dose: 25.2 Gy
Plan Total Prescribed Dose: 50.4 Gy
Reference Point Dosage Given to Date: 48.6 Gy
Reference Point Dosage Given to Date: 48.6 Gy
Reference Point Session Dosage Given: 1.8 Gy
Reference Point Session Dosage Given: 1.8 Gy
Session Number: 27

## 2021-07-30 MED ORDER — SILVER SULFADIAZINE 1 % EX CREA
1.0000 "application " | TOPICAL_CREAM | Freq: Once | CUTANEOUS | Status: AC
  Administered 2021-07-30: 1 via TOPICAL

## 2021-07-31 ENCOUNTER — Other Ambulatory Visit: Payer: Self-pay

## 2021-07-31 ENCOUNTER — Ambulatory Visit
Admission: RE | Admit: 2021-07-31 | Discharge: 2021-07-31 | Disposition: A | Discharge status: Home / Self Care | Payer: Managed Care, Other (non HMO) | Source: Ambulatory Visit | Attending: Radiation Oncology | Admitting: Radiation Oncology

## 2021-07-31 ENCOUNTER — Encounter: Payer: Self-pay | Admitting: Radiation Oncology

## 2021-07-31 DIAGNOSIS — Z5112 Encounter for antineoplastic immunotherapy: Secondary | ICD-10-CM | POA: Diagnosis not present

## 2021-07-31 LAB — RAD ONC ARIA SESSION SUMMARY
Course Elapsed Days: 38
Plan Fractions Treated to Date: 14
Plan Fractions Treated to Date: 28
Plan Prescribed Dose Per Fraction: 1.8 Gy
Plan Prescribed Dose Per Fraction: 1.8 Gy
Plan Total Fractions Prescribed: 14
Plan Total Fractions Prescribed: 28
Plan Total Prescribed Dose: 25.2 Gy
Plan Total Prescribed Dose: 50.4 Gy
Reference Point Dosage Given to Date: 50.4 Gy
Reference Point Dosage Given to Date: 50.4 Gy
Reference Point Session Dosage Given: 1.8 Gy
Reference Point Session Dosage Given: 1.8 Gy
Session Number: 28

## 2021-07-31 NOTE — Telephone Encounter (Signed)
MD sent rx to [facility]. Closing encounter.Marland KitchenJohny Farley ?

## 2021-08-01 ENCOUNTER — Encounter: Payer: Self-pay | Admitting: Internal Medicine

## 2021-08-01 ENCOUNTER — Ambulatory Visit: Payer: Managed Care, Other (non HMO) | Admitting: Internal Medicine

## 2021-08-01 VITALS — BP 130/80 | HR 85 | Temp 98.8°F | Ht 66.0 in | Wt 138.0 lb

## 2021-08-01 DIAGNOSIS — E7800 Pure hypercholesterolemia, unspecified: Secondary | ICD-10-CM | POA: Diagnosis not present

## 2021-08-01 DIAGNOSIS — E559 Vitamin D deficiency, unspecified: Secondary | ICD-10-CM

## 2021-08-01 DIAGNOSIS — Z0001 Encounter for general adult medical examination with abnormal findings: Secondary | ICD-10-CM

## 2021-08-01 DIAGNOSIS — R739 Hyperglycemia, unspecified: Secondary | ICD-10-CM

## 2021-08-01 DIAGNOSIS — F988 Other specified behavioral and emotional disorders with onset usually occurring in childhood and adolescence: Secondary | ICD-10-CM

## 2021-08-01 LAB — LIPID PANEL
Cholesterol: 261 mg/dL — ABNORMAL HIGH (ref 0–200)
HDL: 80.8 mg/dL (ref >39.00)
LDL Cholesterol: 166 mg/dL — ABNORMAL HIGH (ref 0–99)
NonHDL: 180.59
Total CHOL/HDL Ratio: 3
Triglycerides: 73 mg/dL (ref 0.0–149.0)
VLDL: 14.6 mg/dL (ref 0.0–40.0)

## 2021-08-01 LAB — VITAMIN D 25 HYDROXY (VIT D DEFICIENCY, FRACTURES): VITD: 32.72 ng/mL (ref 30.00–100.00)

## 2021-08-01 LAB — HEMOGLOBIN A1C: Hgb A1c MFr Bld: 5 % (ref 4.6–6.5)

## 2021-08-01 MED ORDER — ATORVASTATIN CALCIUM 20 MG PO TABS
20.0000 mg | ORAL_TABLET | Freq: Every day | ORAL | 3 refills | Status: DC
Start: 2021-08-01 — End: 2022-07-27

## 2021-08-01 MED ORDER — METFORMIN HCL ER 500 MG PO TB24
500.0000 mg | ORAL_TABLET | Freq: Every day | ORAL | 3 refills | Status: DC
Start: 2021-08-01 — End: 2022-07-27

## 2021-08-01 NOTE — Assessment & Plan Note (Signed)
Last vitamin D ?Lab Results  ?Component Value Date  ? VD25OH 25 (L) 12/20/2019  ? ?Low, to start oral replacement ? ?

## 2021-08-01 NOTE — Progress Notes (Signed)
Patient ID: Robin Farley, female   DOB: [birth year], 56 y.o.   MRN: [id number] ? ? ? ?     Chief Complaint:: wellness exam and low vit d, hld, ADD ? ?     HPI:  Robin Farley is a 56 y.o. female here for wellness exam; decliens shignx, tdap, colonoscopy, o/w up to date ?         ?              Also finished XRT yesterday, needs adderall and restart work very soon.  Not taking Vit d  Pt denies chest pain, increased sob or doe, wheezing, orthopnea, PND, increased LE swelling, palpitations, dizziness or syncope.   Pt denies polydipsia, polyuria, or new focal neuro s/s.    Pt denies fever, wt loss, night sweats, loss of appetite, or other constitutional symptoms  Trying to follow lower chol diet. .   ?  ?Wt Readings from Last 3 Encounters:  ?08/01/21 138 lb (62.6 kg)  ?07/22/21 137 lb 12.8 oz (62.5 kg)  ?07/01/21 134 lb 4.8 oz (60.9 kg)  ? ?BP Readings from Last 3 Encounters:  ?08/01/21 130/80  ?07/22/21 133/74  ?07/01/21 (!) 141/74  ? ?Immunization History  ?Administered Date(s) Administered  ? Influenza Whole 01/25/2008  ? PFIZER(Purple Top)SARS-COV-2 Vaccination 11/04/2019, 12/01/2019  ? Td 04/13/1998, 03/13/2010  ? ?There are no preventive care reminders to display for this patient. ? ?  ? ?Past Medical History:  ?Diagnosis Date  ? Abdominal pain, generalized 12/27/2008  ? Acute sinusitis, unspecified 02/26/2007  ? ADD 03/13/2010  ? ALLERGIC RHINITIS 02/23/2007  ? ANEMIA 02/05/2007  ? Anxiety 11/17/2011  ? ATTENTION DEFICIT DISORDER, HX OF 02/05/2007  ? Breast cancer ([place])   ? Deviated nasal septum 02/05/2007  ? Diabetes mellitus without complication ([facility])   ? Family history of breast cancer 11/20/2020  ? Family history of melanoma 11/20/2020  ? FATIGUE 02/23/2007  ? HYPERLIPIDEMIA 02/23/2007  ? Personal history of malignant melanoma 11/20/2020  ? SWELLING MASS OR LUMP IN HEAD AND NECK 07/27/2008  ? UTI 02/23/2007  ? ?Past Surgical History:  ?Procedure Laterality Date  ? BREAST RECONSTRUCTION WITH PLACEMENT OF  TISSUE EXPANDER AND FLEX HD (ACELLULAR HYDRATED DERMIS) Right 05/08/2021  ? Procedure: BREAST RECONSTRUCTION WITH PLACEMENT OF TISSUE EXPANDER AND FLEX HD (ACELLULAR HYDRATED DERMIS);  Surgeon: Cindra Presume, MD;  Location: [facility];  Service: Plastics;  Laterality: Right;  ? BROW LIFT    ? CESAREAN SECTION    ? [facility]  ? Nasal  ? MASTECTOMY W/ SENTINEL NODE BIOPSY Right 05/08/2021  ? Procedure: RIGHT MASTECTOMY WITH SENTINEL LYMPH NODE BIOPSY;  Surgeon: Donnie Mesa, MD;  Location: [facility];  Service: General;  Laterality: Right;  ? MELANOMA EXCISION WITH SENTINEL LYMPH NODE BIOPSY Left 10/24/2012  ? Procedure: MELANOMA wide EXCISION left lateral thigh WITH SENTINEL LYMPH NODE BIOPSY left groin;  Surgeon: Edward Jolly, MD;  Location: [facility];  Service: General;  Laterality: Left;  ? NASAL SEPTUM SURGERY    ? PORTACATH PLACEMENT Right 12/02/2020  ? Procedure: INSERTION PORT-A-CATH;  Surgeon: Donnie Mesa, MD;  Location: [facility];  Service: General;  Laterality: Right;  ? RADIOACTIVE SEED GUIDED AXILLARY SENTINEL LYMPH NODE Right 05/08/2021  ? Procedure: RADIOACTIVE SEED GUIDED AXILLARY SENTINEL LYMPH NODE DISSECTION;  Surgeon: Donnie Mesa, MD;  Location: [facility];  Service: General;  Laterality: Right;  ? ? reports that she  has never smoked. She has never used smokeless tobacco. She reports that she does not currently use alcohol. She reports that she does not use drugs. ?family history includes Alcohol abuse in an other family member; Atrial fibrillation in her father and sister; Brain cancer (age of onset: 50) in her paternal grandfather; Breast cancer in an other family member; Breast cancer (age of onset: 80) in her mother; COPD in her father; Cancer in her paternal aunt; Depression in an other family member; Glaucoma in an other family member; Hypertension in her mother; Kidney cancer in her  maternal aunt; Leukemia in her paternal aunt; Melanoma in her cousin. ?Allergies  ?Allergen Reactions  ? Promethazine Hcl   ?  Legs twitch  ? ?Current Outpatient Medications on File Prior to Visit  ?Medication Sig Dispense Refill  ? amphetamine-dextroamphetamine (ADDERALL) 20 MG tablet Take 1.5 tabs by mouth twice per day 90 tablet 0  ? ?No current facility-administered medications on file prior to visit.  ? ?     ROS:  All others reviewed and negative. ? ?Objective  ? ?     PE:  BP 130/80 (BP Location: Left Arm, Patient Position: Sitting, Cuff Size: Normal)   Pulse 85   Temp 98.8 ?F (37.1 ?C) (Oral)   Ht '5\' 6"'$  (1.676 m)   Wt 138 lb (62.6 kg)   LMP 04/27/2016   SpO2 97%   BMI 22.27 kg/m?  ? ?              Constitutional: Pt appears in NAD ?              HENT: Head: NCAT.  ?              Right Ear: External ear normal.   ?              Left Ear: External ear normal.  ?              Eyes: . Pupils are equal, round, and reactive to light. Conjunctivae and EOM are normal ?              Nose: without d/c or deformity ?              Neck: Neck supple. Gross normal ROM ?              Cardiovascular: Normal rate and regular rhythm.   ?              Pulmonary/Chest: Effort normal and breath sounds without rales or wheezing.  ?              Abd:  Soft, NT, ND, + BS, no organomegaly ?              Neurological: Pt is alert. At baseline orientation, motor grossly intact ?              Skin: Skin is warm. No rashes, no other new lesions, LE edema - none ?              Psychiatric: Pt behavior is normal without agitation  ? ?Micro: none ? ?Cardiac tracings I have personally interpreted today:  none ? ?Pertinent Radiological findings (summarize): none  ? ?Lab Results  ?Component Value Date  ? WBC 2.7 (L) 07/22/2021  ? HGB 10.8 (L) 07/22/2021  ? HCT 31.9 (L) 07/22/2021  ? PLT 192 07/22/2021  ? GLUCOSE 89 07/22/2021  ? CHOL 261 (H) 08/01/2021  ? TRIG  73.0 08/01/2021  ? HDL 80.80 08/01/2021  ? LDLDIRECT 159.0 08/21/2014  ?  LDLCALC 166 (H) 08/01/2021  ? ALT 18 07/22/2021  ? AST 17 07/22/2021  ? NA 141 07/22/2021  ? K 3.8 07/22/2021  ? CL 108 07/22/2021  ? CREATININE 0.66 07/22/2021  ? BUN 14 07/22/2021  ? CO2 27 07/22/2021  ? TSH 2.777 07/22/2021  ? HGBA1C 5.0 08/01/2021  ? ?Assessment/Plan:  ?Robin Farley is a 56 y.o. White or Caucasian [1] female with  has a past medical history of Abdominal pain, generalized (12/27/2008), Acute sinusitis, unspecified (02/26/2007), ADD (03/13/2010), ALLERGIC RHINITIS (02/23/2007), ANEMIA (02/05/2007), Anxiety (11/17/2011), ATTENTION DEFICIT DISORDER, HX OF (02/05/2007), Breast cancer ([facility]), Deviated nasal septum (02/05/2007), Diabetes mellitus without complication ([place]), Family history of breast cancer (11/20/2020), Family history of melanoma (11/20/2020), FATIGUE (02/23/2007), HYPERLIPIDEMIA (02/23/2007), Personal history of malignant melanoma (11/20/2020), SWELLING MASS OR LUMP IN HEAD AND NECK (07/27/2008), and UTI (02/23/2007). ? ?Vitamin D deficiency ?Last vitamin D ?Lab Results  ?Component Value Date  ? VD25OH 25 (L) 12/20/2019  ? ?Low, to start oral replacement ? ? ?Encounter for well adult exam with abnormal findings ?Age and sex appropriate education and counseling updated with regular exercise and diet ?Referrals for preventative services - declines colonoscopy ?Immunizations addressed - declines shingrix, tdap ?Smoking counseling  - none needed ?Evidence for depression or other mood disorder - none significant ?Most recent labs reviewed. ?I have personally reviewed and have noted: ?1) the patient's medical and social history ?2) The patient's current medications and supplements ?3) The patient's height, weight, and BMI have been recorded in the chart ? ? ?Hyperlipidemia ?Lab Results  ?Component Value Date  ? LDLCALC 166 (H) 08/01/2021  ? ?Uncontrolled - , pt to restart lipitor ? ? ?Hyperglycemia ?Lab Results  ?Component Value Date  ? HGBA1C 5.0 08/01/2021  ? ?Stable, pt to continue  current medical treatment metformin ? ? ?Attention deficit disorder ?uncontrolled, to restart adderall ? ?Followup: Return in about 1 year (around 08/02/2022). ? ?Cathlean Cower, MD 08/05/2021 8:09 PM ?[facility] ?L

## 2021-08-01 NOTE — Patient Instructions (Signed)

## 2021-08-05 ENCOUNTER — Encounter: Payer: Self-pay | Admitting: Internal Medicine

## 2021-08-05 NOTE — Assessment & Plan Note (Signed)
Age and sex appropriate education and counseling updated with regular exercise and diet ?Referrals for preventative services - declines colonoscopy ?Immunizations addressed - declines shingrix, tdap ?Smoking counseling  - none needed ?Evidence for depression or other mood disorder - none significant ?Most recent labs reviewed. ?I have personally reviewed and have noted: ?1) the patient's medical and social history ?2) The patient's current medications and supplements ?3) The patient's height, weight, and BMI have been recorded in the chart ? ?

## 2021-08-05 NOTE — Assessment & Plan Note (Signed)
Lab Results  ?Component Value Date  ? LDLCALC 166 (H) 08/01/2021  ? ?Uncontrolled - , pt to restart lipitor ? ?

## 2021-08-05 NOTE — Assessment & Plan Note (Signed)
Lab Results  ?Component Value Date  ? HGBA1C 5.0 08/01/2021  ? ?Stable, pt to continue current medical treatment metformin ? ?

## 2021-08-05 NOTE — Assessment & Plan Note (Addendum)
uncontrolled, to restart adderall ?

## 2021-08-12 ENCOUNTER — Inpatient Hospital Stay: Payer: Managed Care, Other (non HMO) | Attending: Oncology | Admitting: Hematology and Oncology

## 2021-08-12 ENCOUNTER — Inpatient Hospital Stay: Payer: Managed Care, Other (non HMO)

## 2021-08-12 ENCOUNTER — Other Ambulatory Visit: Payer: Self-pay

## 2021-08-12 ENCOUNTER — Encounter: Payer: Self-pay | Admitting: Hematology and Oncology

## 2021-08-12 VITALS — BP 131/71 | HR 87 | Temp 97.7°F | Resp 18 | Ht 66.0 in | Wt 141.8 lb

## 2021-08-12 DIAGNOSIS — Z171 Estrogen receptor negative status [ER-]: Secondary | ICD-10-CM

## 2021-08-12 DIAGNOSIS — Z95828 Presence of other vascular implants and grafts: Secondary | ICD-10-CM

## 2021-08-12 DIAGNOSIS — C50411 Malignant neoplasm of upper-outer quadrant of right female breast: Secondary | ICD-10-CM | POA: Diagnosis present

## 2021-08-12 DIAGNOSIS — Z5112 Encounter for antineoplastic immunotherapy: Secondary | ICD-10-CM | POA: Insufficient documentation

## 2021-08-12 DIAGNOSIS — Z79899 Other long term (current) drug therapy: Secondary | ICD-10-CM | POA: Insufficient documentation

## 2021-08-12 DIAGNOSIS — Z9011 Acquired absence of right breast and nipple: Secondary | ICD-10-CM | POA: Diagnosis not present

## 2021-08-12 LAB — CBC WITH DIFFERENTIAL/PLATELET
Abs Immature Granulocytes: 0.01 10*3/uL (ref 0.00–0.07)
Basophils Absolute: 0 10*3/uL (ref 0.0–0.1)
Basophils Relative: 0 %
Eosinophils Absolute: 0.1 10*3/uL (ref 0.0–0.5)
Eosinophils Relative: 3 %
HCT: 32.4 % — ABNORMAL LOW (ref 36.0–46.0)
Hemoglobin: 11 g/dL — ABNORMAL LOW (ref 12.0–15.0)
Immature Granulocytes: 0 %
Lymphocytes Relative: 12 %
Lymphs Abs: 0.4 10*3/uL — ABNORMAL LOW (ref 0.7–4.0)
MCH: 33.2 pg (ref 26.0–34.0)
MCHC: 34 g/dL (ref 30.0–36.0)
MCV: 97.9 fL (ref 80.0–100.0)
Monocytes Absolute: 0.4 10*3/uL (ref 0.1–1.0)
Monocytes Relative: 12 %
Neutro Abs: 2.3 10*3/uL (ref 1.7–7.7)
Neutrophils Relative %: 73 %
Platelets: 203 10*3/uL (ref 150–400)
RBC: 3.31 MIL/uL — ABNORMAL LOW (ref 3.87–5.11)
RDW: 12.2 % (ref 11.5–15.5)
WBC: 3.2 10*3/uL — ABNORMAL LOW (ref 4.0–10.5)
nRBC: 0 % (ref 0.0–0.2)

## 2021-08-12 LAB — COMPREHENSIVE METABOLIC PANEL
ALT: 13 U/L (ref 0–44)
AST: 16 U/L (ref 15–41)
Albumin: 3.8 g/dL (ref 3.5–5.0)
Alkaline Phosphatase: 68 U/L (ref 38–126)
Anion gap: 6 (ref 5–15)
BUN: 10 mg/dL (ref 6–20)
CO2: 27 mmol/L (ref 22–32)
Calcium: 9.2 mg/dL (ref 8.9–10.3)
Chloride: 107 mmol/L (ref 98–111)
Creatinine, Ser: 0.75 mg/dL (ref 0.44–1.00)
GFR, Estimated: >60 mL/min (ref >60)
Glucose, Bld: 128 mg/dL — ABNORMAL HIGH (ref 70–99)
Potassium: 3.6 mmol/L (ref 3.5–5.1)
Sodium: 140 mmol/L (ref 135–145)
Total Bilirubin: 0.3 mg/dL (ref 0.3–1.2)
Total Protein: 6.3 g/dL — ABNORMAL LOW (ref 6.5–8.1)

## 2021-08-12 LAB — TSH: TSH: 2.301 u[IU]/mL (ref 0.350–4.500)

## 2021-08-12 MED ORDER — SODIUM CHLORIDE 0.9 % IV SOLN: Freq: Once | INTRAVENOUS | Status: AC

## 2021-08-12 MED ORDER — HEPARIN SOD (PORK) LOCK FLUSH 100 UNIT/ML IV SOLN
500.0000 [IU] | Freq: Once | INTRAVENOUS | Status: AC | PRN
  Administered 2021-08-12: 500 [IU]

## 2021-08-12 MED ORDER — SODIUM CHLORIDE 0.9% FLUSH
10.0000 mL | INTRAVENOUS | Status: DC | PRN
  Administered 2021-08-12: 10 mL

## 2021-08-12 MED ORDER — SODIUM CHLORIDE 0.9 % IV SOLN
200.0000 mg | Freq: Once | INTRAVENOUS | Status: AC
  Administered 2021-08-12: 200 mg via INTRAVENOUS
  Filled 2021-08-12: qty 200

## 2021-08-12 NOTE — Patient Instructions (Signed)
[facility]COL[facility]  Discharge Instructions: ?Thank you for choosing [facility] to provide your oncology and hematology care.  ? ?If you have a lab appointment with the Cancer Center, please go directly to the Cancer Center and check in at the registration area. ?  ?Wear comfortable clothing and clothing appropriate for easy access to any Portacath or PICC line.  ? ?We strive to give you quality time with your provider. You may need to reschedule your appointment if you arrive late (15 or more minutes).  Arriving late affects you and other patients whose appointments are after yours.  Also, if you miss three or more appointments without notifying the office, you may be dismissed from the clinic at the provider?s discretion.    ?  ?For prescription refill requests, have your pharmacy contact our office and allow 72 hours for refills to be completed.   ? ?Today you received the following chemotherapy and/or immunotherapy agents: Keytruda ?  ?To help prevent nausea and vomiting after your treatment, we encourage you to take your nausea medication as directed. ? ?BELOW ARE SYMPTOMS THAT SHOULD BE REPORTED IMMEDIATELY: ?*FEVER GREATER THAN 100.4 F (38 ?C) OR HIGHER ?*CHILLS OR SWEATING ?*NAUSEA AND VOMITING THAT IS NOT CONTROLLED WITH YOUR NAUSEA MEDICATION ?*UNUSUAL SHORTNESS OF BREATH ?*UNUSUAL BRUISING OR BLEEDING ?*URINARY PROBLEMS (pain or burning when urinating, or frequent urination) ?*BOWEL PROBLEMS (unusual diarrhea, constipation, pain near the anus) ?TENDERNESS IN MOUTH AND THROAT WITH OR WITHOUT PRESENCE OF ULCERS (sore throat, sores in mouth, or a toothache) ?UNUSUAL RASH, SWELLING OR PAIN  ?UNUSUAL VAGINAL DISCHARGE OR ITCHING  ? ?Items with * indicate a potential emergency and should be followed up as soon as possible or go to the Emergency Department if any problems should occur. ? ?Please show the CHEMOTHERAPY ALERT CARD or IMMUNOTHERAPY ALERT CARD at check-in to the  Emergency Department and triage nurse. ? ?Should you have questions after your visit or need to cancel or reschedule your appointment, please contact [facility]  Dept: [phone]  and follow the prompts.  Office hours are 8:00 a.m. to 4:30 p.m. Monday - Friday. Please note that voicemails left after 4:00 p.m. may not be returned until the following business day.  We are closed weekends and major holidays. You have access to a nurse at all times for urgent questions. Please call the main number to the clinic Dept: [phone] and follow the prompts. ? ? ?For any non-urgent questions, you may also contact your provider using MyChart. We now offer e-Visits for anyone 18 and older to request care online for non-urgent symptoms. For details visit [email or web address]. ?  ?Also download the MyChart app! Go to the app store, search "MyChart", open the app, select [facility], and log in with your MyChart username and password. ? ?Due to Covid, a mask is required upon entering the hospital/clinic. If you do not have a mask, one will be given to you upon arrival. For doctor visits, patients may have 1 support person aged 18 or older with them. For treatment visits, patients cannot have anyone with them due to current Covid guidelines and our immunocompromised population.  ? ?

## 2021-08-12 NOTE — Progress Notes (Signed)
?Robin Farley  ?[phone] [phone]  ? ? ?ID: VIENO TARRANT DOB: [birth year]  MR#: [id number]  HQI#:[id number] ? ?Patient Care Team: ?[name], MD as PCP - General ?Rockwell Germany, RN as Oncology Nurse Navigator ?Mauro Kaufmann, RN as Oncology Nurse Navigator ?Donnie Mesa, MD as Consulting Physician (General Surgery) ?Brien Few, MD as Consulting Physician (Obstetrics and Gynecology) ?Benay Pike, MD as Consulting Physician (Hematology and Oncology) ?Benay Pike, MD ? 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 ?CURRENT TREATMENT: adjuvant immunotherapy ? ?INTERVAL HISTORY: ? 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   ?She is here for follow-up on adjuvant immunotherapy.  She is tolerating immunotherapy very well.  She still feels that her mouth is a bit sensitive but she is able to swallow, taste and eat.  She has gained about 4 pounds in the last 3 to 4 weeks.  She denies any changes in breathing, bowel habits or urinary habits.  Last TSH was normal. ? ? ?We are following her TSH since she is receiving Pembrolizumab, levels are noted below.  ?Lab Results  ?Component Value Date  ? TSH 2.777 07/22/2021  ? TSH 3.330 07/01/2021  ? TSH 2.590 06/10/2021  ? TSH 1.960 05/20/2021  ? TSH 3.095 04/29/2021  ? ? ?REVIEW OF SYSTEMS: ? ?Rest of the pertinent 10 point ROS reviewed and negative except as mentioned above ? COVID 19 VACCINATION STATUS: [name] x2, most recently 11/2019; infection 07/2020 ? ? ?HISTORY OF CURRENT ILLNESS: ? ? Cancer Staging  ?Malignant neoplasm of upper-outer quadrant of right breast in female, estrogen receptor negative ([place]) ?Staging form: Breast, AJCC 8th Edition ?- Clinical: Stage IIB (cT1c, cN1, cM0, G3, ER-, PR-, HER2-) - Signed by [name], MD on 11/20/2020 ?Histologic grading system: 3 grade system ?- Pathologic stage from 05/08/2021: No Stage  Recommended (ypT2, pN1a, cM0, G3, ER-, PR-, HER2-) - Signed by [name], NP on 05/21/2021 ?Stage prefix: Post-therapy ?Histologic grading system: 3 grade system ? ?56 y.o. [place] woman status post right breast upper outer quadrant biopsy 11/08/2020 for a clinical T1c N1, stage IIB invasive ductal carcinoma, grade 3, functionally triple negative, with an MIB-1 of 30% ? (a) right breast retroareolar biopsy 12/09/2020 shows invasive ductal carcinoma, grade 2 ? ?(1) genetics test 12/03/2020 through the [facility] +RNAinsight Panel found no deleterious mutations in AIP, ALK, APC, ATM, AXIN2, BAP1, BARD1, BLM, BMPR1A, BRCA1, BRCA2, BRIP1, CDC73, CDH1, CDK4, CDKN1B, CDKN2A, CHEK2, CTNNA1, DICER1, FANCC, FH, FLCN, GALNT12, KIF1B, LZTR1, MAX, MEN1, MET, MLH1, MSH2, MSH3, MSH6, MUTYH, NBN, NF1, NF2, NTHL1, PALB2, PHOX2B, PMS2, POT1, PRKAR1A, PTCH1, PTEN, RAD51C, RAD51D, RB1, RECQL, RET, SDHA, SDHAF2, SDHB, SDHC, SDHD, SMAD4, SMARCA4, SMARCB1, SMARCE1, STK11, SUFU, TMEM127, TP53, TSC1, TSC2, VHL and XRCC2 (sequencing and deletion/duplication); EGFR, EGLN1, HOXB13, KIT, MITF, PDGFRA, POLD1, and POLE (sequencing only); EPCAM and GREM1 (deletion/duplication only).   ? ?(2) neoadjuvant chemotherapy consisting of carboplatin and paclitaxel with pembrolizumab every 21 days started 12/03/2020, completed 01/21/2021 (3 cycles) followed by doxorubicin and cyclophosphamide with pembrolizumab every 21 days x 4 starting 02/04/2021 ? 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 ?She had right mastectomy and targeted right axillary dissection on 05/08/2021.  Pathology showed 2.3 x 1.7 x 1.0 cm invasive ductal carcinoma, grade 3 of 3, treatment effect was robust in the breast and minimal in lymph nodes.  All surgical  margins negative for invasive carcinoma, out of 4 examined the lymph nodes, 2 with micrometastasis and 0 with  micrometastasis or isolated tumor cells.  Largest metastatic deposit in the lymph node was 4 mm and extranodal extension was present. ? ?She is now undergoing adjuvant radiation. ? ? ?PAST MEDICAL HISTORY: ?Past Medical History:  ?Diagnosis Date  ? Abdominal pain, generalized 12/27/2008  ? Acute sinusitis, unspecified 02/26/2007  ? ADD 03/13/2010  ? ALLERGIC RHINITIS 02/23/2007  ? ANEMIA 02/05/2007  ? Anxiety 11/17/2011  ? ATTENTION DEFICIT DISORDER, HX OF 02/05/2007  ? Breast cancer ([place])   ? Deviated nasal septum 02/05/2007  ? Diabetes mellitus without complication ([place])   ? Family history of breast cancer 11/20/2020  ? Family history of melanoma 11/20/2020  ? FATIGUE 02/23/2007  ? HYPERLIPIDEMIA 02/23/2007  ? Personal history of malignant melanoma 11/20/2020  ? SWELLING MASS OR LUMP IN HEAD AND NECK 07/27/2008  ? UTI 02/23/2007  ? ? ?PAST SURGICAL HISTORY: ?Past Surgical History:  ?Procedure Laterality Date  ? BREAST RECONSTRUCTION WITH PLACEMENT OF TISSUE EXPANDER AND FLEX HD (ACELLULAR HYDRATED DERMIS) Right 05/08/2021  ? Procedure: BREAST RECONSTRUCTION WITH PLACEMENT OF TISSUE EXPANDER AND FLEX HD (ACELLULAR HYDRATED DERMIS);  Surgeon: Cindra Presume, MD;  Location: [facility];  Service: Plastics;  Laterality: Right;  ? BROW LIFT    ? CESAREAN SECTION    ? [place]  ? Nasal  ? MASTECTOMY W/ SENTINEL NODE BIOPSY Right 05/08/2021  ? Procedure: RIGHT MASTECTOMY WITH SENTINEL LYMPH NODE BIOPSY;  Surgeon: Donnie Mesa, MD;  Location: [facility];  Service: General;  Laterality: Right;  ? MELANOMA EXCISION WITH SENTINEL LYMPH NODE BIOPSY Left 10/24/2012  ? Procedure: MELANOMA wide EXCISION left lateral thigh WITH SENTINEL LYMPH NODE BIOPSY left groin;  Surgeon: Edward Jolly, MD;  Location: [facility];  Service: General;  Laterality: Left;  ? NASAL SEPTUM SURGERY    ? PORTACATH PLACEMENT Right 12/02/2020  ? Procedure: INSERTION PORT-A-CATH;  Surgeon:  Donnie Mesa, MD;  Location: [facility];  Service: General;  Laterality: Right;  ? RADIOACTIVE SEED GUIDED AXILLARY SENTINEL LYMPH NODE Right 05/08/2021  ? Procedure: RADIOACTIVE SEED GUIDED AXILLARY SENTINEL LYMPH NODE DISSECTION;  Surgeon: Donnie Mesa, MD;  Location: [facility];  Service: General;  Laterality: Right;  ? ? ?FAMILY HISTORY: ?Family History  ?Problem Relation Age of Onset  ? Hypertension Mother   ? Breast cancer Mother 38  ? COPD Father   ? Atrial fibrillation Father   ? Atrial fibrillation Sister   ? Kidney cancer Maternal Aunt   ?     dx 38s  ? Leukemia Paternal Aunt   ?     dx after 80  ? Cancer Paternal Aunt   ?     unknown type; dx after 58  ? Brain cancer Paternal Grandfather 51  ?     brain tumor per pt  ? Melanoma Cousin   ?     arm; no mets per pt  ? Alcohol abuse Other   ? Depression Other   ? Glaucoma Other   ? Breast cancer Other   ?     MGM's sisters, x3, dx after 62  ? Colon polyps Neg Hx   ? Her father died at age 13 from COPD. Her mother is currently 27 years old as of 11/2020. Cayle has no full siblings and three half-siblings-- two brothers and one sister. She reports breast  cancer in her mother and three great-aunts, brain tumor in her paternal grandfather, and metastatic melanoma in a cousin. ? ? ?GYNECOLOGIC HISTORY:  ?Patient's last menstrual period was 04/27/2016. ?Menarche: [age] years old ?Age at first live birth: 56 years old ?GX P 2 ?LMP 2016 ?Contraceptive used for 1-2 years ?HRT estrogen cream irregularly ?Hysterectomy? no ?BSO? no ? ? 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 She lives in [place], [place] but travels here frequently for work. Husband [name] works in Press photographer for [employer]. She lives at home with [name] and their daughter [name], who is 54 and is a Ship broker at [employer]. Daughter [name], age 69, is a Secretary/administrator in [place]. [place]. Ilithyia is  not a Designer, fashion/clothing. ?  ? ADVANCED DIRECTIVES: In the absence of any documentation to the contrary, the patient's spouse is their HCPOA.  ? ? ?HEALTH MAINTENANCE: ?Social History  ? ?Tobacco Use  ? Smoking sta

## 2021-08-18 ENCOUNTER — Ambulatory Visit: Payer: Managed Care, Other (non HMO) | Attending: Hematology and Oncology

## 2021-08-18 VITALS — Wt 141.2 lb

## 2021-08-18 DIAGNOSIS — Z483 Aftercare following surgery for neoplasm: Secondary | ICD-10-CM | POA: Insufficient documentation

## 2021-08-18 NOTE — Therapy (Signed)
?OUTPATIENT PHYSICAL THERAPY [facility] SCREENING NOTE ? ? ?Patient Name: Robin Farley ?MRN: [id number] ?DOB:06-27-1965, 56 y.o., female ?Today's Date: 08/18/2021 ? ?PCP: [name], MD ?REFERRING PROVIDER: Benay Pike, MD ? ? PT End of Session - 08/18/21 1516   ? ? Visit Number 2   # unchanged due to screen only  ? PT Start Time 1513   ? PT Stop Time 7482   ? PT Time Calculation (min) 5 min   ? Activity Tolerance Patient tolerated treatment well   ? Behavior During Therapy [facility] for tasks assessed/performed   ? ?  ?  ? ?  ? ? ?Past Medical History:  ?Diagnosis Date  ? Abdominal pain, generalized 12/27/2008  ? Acute sinusitis, unspecified 02/26/2007  ? ADD 03/13/2010  ? ALLERGIC RHINITIS 02/23/2007  ? ANEMIA 02/05/2007  ? Anxiety 11/17/2011  ? ATTENTION DEFICIT DISORDER, HX OF 02/05/2007  ? Breast cancer ([place])   ? Deviated nasal septum 02/05/2007  ? Diabetes mellitus without complication ([place])   ? Family history of breast cancer 11/20/2020  ? Family history of melanoma 11/20/2020  ? FATIGUE 02/23/2007  ? HYPERLIPIDEMIA 02/23/2007  ? Personal history of malignant melanoma 11/20/2020  ? SWELLING MASS OR LUMP IN HEAD AND NECK 07/27/2008  ? UTI 02/23/2007  ? ?Past Surgical History:  ?Procedure Laterality Date  ? BREAST RECONSTRUCTION WITH PLACEMENT OF TISSUE EXPANDER AND FLEX HD (ACELLULAR HYDRATED DERMIS) Right 05/08/2021  ? Procedure: BREAST RECONSTRUCTION WITH PLACEMENT OF TISSUE EXPANDER AND FLEX HD (ACELLULAR HYDRATED DERMIS);  Surgeon: Cindra Presume, MD;  Location: [facility];  Service: Plastics;  Laterality: Right;  ? BROW LIFT    ? CESAREAN SECTION    ? [place]  ? Nasal  ? MASTECTOMY W/ SENTINEL NODE BIOPSY Right 05/08/2021  ? Procedure: RIGHT MASTECTOMY WITH SENTINEL LYMPH NODE BIOPSY;  Surgeon: Donnie Mesa, MD;  Location: [facility];  Service: General;  Laterality: Right;  ? MELANOMA EXCISION WITH SENTINEL LYMPH NODE BIOPSY Left 10/24/2012  ? Procedure: MELANOMA  wide EXCISION left lateral thigh WITH SENTINEL LYMPH NODE BIOPSY left groin;  Surgeon: Edward Jolly, MD;  Location: [facility];  Service: General;  Laterality: Left;  ? NASAL SEPTUM SURGERY    ? PORTACATH PLACEMENT Right 12/02/2020  ? Procedure: INSERTION PORT-A-CATH;  Surgeon: Donnie Mesa, MD;  Location: [facility];  Service: General;  Laterality: Right;  ? RADIOACTIVE SEED GUIDED AXILLARY SENTINEL LYMPH NODE Right 05/08/2021  ? Procedure: RADIOACTIVE SEED GUIDED AXILLARY SENTINEL LYMPH NODE DISSECTION;  Surgeon: Donnie Mesa, MD;  Location: [facility];  Service: General;  Laterality: Right;  ? ?Patient Active Problem List  ? Diagnosis Date Noted  ? Malignant neoplasm of overlapping sites of right female breast ([place]) 06/03/2021  ? Elevated LFTs 12/10/2020  ? Genetic testing 12/06/2020  ? Port-A-Cath in place 12/03/2020  ? Family history of breast cancer 11/20/2020  ? Personal history of malignant melanoma 11/20/2020  ? Family history of melanoma 11/20/2020  ? Malignant neoplasm of upper-outer quadrant of right breast in female, estrogen receptor negative ([place]) 11/12/2020  ? Vitamin D deficiency 06/02/2019  ? Hyperglycemia 06/02/2019  ? Blood pressure elevated without history of HTN 06/02/2019  ? Headache 12/16/2018  ? Urinary incontinence, urge 12/14/2017  ? Calcific bursitis of shoulder 07/10/2015  ? Nausea alone 04/27/2013  ? 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 Anxiety 11/17/2011  ? Rosacea 07/08/2011  ? Right shoulder pain 07/08/2011  ?  Knee effusion, left 08/18/2010  ? Encounter for well adult exam with abnormal findings 08/18/2010  ? Attention deficit disorder 03/13/2010  ? GERD 12/27/2008  ? Hyperlipidemia 02/23/2007  ? DEPRESSION 02/23/2007  ? ALLERGIC RHINITIS 02/23/2007  ? FATIGUE 02/23/2007  ? ANEMIA 02/05/2007  ? Deviated nasal septum 02/05/2007  ? ? ?REFERRING DIAG: right breast cancer at risk for lymphedema ? ?THERAPY DIAG:  ?Aftercare following  surgery for neoplasm ? ?PERTINENT HISTORY: 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  She had neoadjuvant chemo, and had  a right mastectomy with immediate expander on 05/08/2021. She will be having radiation.  ? ?PRECAUTIONS: right UE Lymphedema risk, None ? ?SUBJECTIVE: Pt returns for her 3 month L-Dex screen.  ? ?PAIN:  ?Are you having pain? No ? ?SOZO SCREENING: ?Patient was assessed today using the SOZO machine to determine the lymphedema index score. This was compared to her baseline score. It was determined that she is within the recommended range when compared to her baseline and no further action is needed at this time. She will continue SOZO screenings. These are done every 3 months for 2 years post operatively followed by every 6 months for 2 years, and then annually. ? ? ? ?Otelia Limes, PTA ?08/18/2021, 3:18 PM ? ?  ? ?

## 2021-09-02 ENCOUNTER — Inpatient Hospital Stay: Payer: Managed Care, Other (non HMO)

## 2021-09-02 ENCOUNTER — Encounter: Payer: Self-pay | Admitting: Hematology and Oncology

## 2021-09-02 ENCOUNTER — Encounter: Payer: Self-pay | Admitting: *Deleted

## 2021-09-02 ENCOUNTER — Inpatient Hospital Stay (HOSPITAL_BASED_OUTPATIENT_CLINIC_OR_DEPARTMENT_OTHER): Payer: Managed Care, Other (non HMO) | Admitting: Hematology and Oncology

## 2021-09-02 VITALS — BP 115/79 | HR 90 | Temp 98.1°F | Resp 16 | Ht 66.0 in | Wt 141.3 lb

## 2021-09-02 DIAGNOSIS — Z171 Estrogen receptor negative status [ER-]: Secondary | ICD-10-CM

## 2021-09-02 DIAGNOSIS — Z5112 Encounter for antineoplastic immunotherapy: Secondary | ICD-10-CM | POA: Diagnosis not present

## 2021-09-02 DIAGNOSIS — Z95828 Presence of other vascular implants and grafts: Secondary | ICD-10-CM

## 2021-09-02 DIAGNOSIS — C50411 Malignant neoplasm of upper-outer quadrant of right female breast: Secondary | ICD-10-CM

## 2021-09-02 LAB — COMPREHENSIVE METABOLIC PANEL
ALT: 10 U/L (ref 0–44)
AST: 13 U/L — ABNORMAL LOW (ref 15–41)
Albumin: 3.9 g/dL (ref 3.5–5.0)
Alkaline Phosphatase: 86 U/L (ref 38–126)
Anion gap: 5 (ref 5–15)
BUN: 13 mg/dL (ref 6–20)
CO2: 29 mmol/L (ref 22–32)
Calcium: 9.4 mg/dL (ref 8.9–10.3)
Chloride: 108 mmol/L (ref 98–111)
Creatinine, Ser: 0.88 mg/dL (ref 0.44–1.00)
GFR, Estimated: >60 mL/min (ref >60)
Glucose, Bld: 118 mg/dL — ABNORMAL HIGH (ref 70–99)
Potassium: 3.8 mmol/L (ref 3.5–5.1)
Sodium: 142 mmol/L (ref 135–145)
Total Bilirubin: 0.4 mg/dL (ref 0.3–1.2)
Total Protein: 6.8 g/dL (ref 6.5–8.1)

## 2021-09-02 LAB — CBC WITH DIFFERENTIAL/PLATELET
Abs Immature Granulocytes: 0.02 10*3/uL (ref 0.00–0.07)
Basophils Absolute: 0 10*3/uL (ref 0.0–0.1)
Basophils Relative: 0 %
Eosinophils Absolute: 0.1 10*3/uL (ref 0.0–0.5)
Eosinophils Relative: 3 %
HCT: 31.4 % — ABNORMAL LOW (ref 36.0–46.0)
Hemoglobin: 11 g/dL — ABNORMAL LOW (ref 12.0–15.0)
Immature Granulocytes: 1 %
Lymphocytes Relative: 12 %
Lymphs Abs: 0.5 10*3/uL — ABNORMAL LOW (ref 0.7–4.0)
MCH: 32.8 pg (ref 26.0–34.0)
MCHC: 35 g/dL (ref 30.0–36.0)
MCV: 93.7 fL (ref 80.0–100.0)
Monocytes Absolute: 0.5 10*3/uL (ref 0.1–1.0)
Monocytes Relative: 11 %
Neutro Abs: 3 10*3/uL (ref 1.7–7.7)
Neutrophils Relative %: 73 %
Platelets: 184 10*3/uL (ref 150–400)
RBC: 3.35 MIL/uL — ABNORMAL LOW (ref 3.87–5.11)
RDW: 11.8 % (ref 11.5–15.5)
WBC: 4.1 10*3/uL (ref 4.0–10.5)
nRBC: 0 % (ref 0.0–0.2)

## 2021-09-02 LAB — TSH: TSH: 2.602 u[IU]/mL (ref 0.350–4.500)

## 2021-09-02 MED ORDER — SODIUM CHLORIDE 0.9 % IV SOLN: Freq: Once | INTRAVENOUS | Status: AC

## 2021-09-02 MED ORDER — SODIUM CHLORIDE 0.9% FLUSH
10.0000 mL | INTRAVENOUS | Status: DC | PRN
  Administered 2021-09-02: 10 mL

## 2021-09-02 MED ORDER — SODIUM CHLORIDE 0.9 % IV SOLN
200.0000 mg | Freq: Once | INTRAVENOUS | Status: AC
  Administered 2021-09-02: 200 mg via INTRAVENOUS
  Filled 2021-09-02: qty 200

## 2021-09-02 MED ORDER — HEPARIN SOD (PORK) LOCK FLUSH 100 UNIT/ML IV SOLN
500.0000 [IU] | Freq: Once | INTRAVENOUS | Status: AC | PRN
  Administered 2021-09-02: 500 [IU]

## 2021-09-02 NOTE — Progress Notes (Signed)
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   She is here for follow-up on adjuvant immunotherapy.  She is tolerating immunotherapy very well.  She denies any new complaints since last visit.  She still feels that her mouth is a bit sensitive but she is able to swallow, eat and maintain her weight.  She denies any new changes in breathing, change in bowel habits or urinary habits.  She was wondering about the surveillance recommendations when she finishes immunotherapy.   We are following her TSH since she is receiving Pembrolizumab, levels are noted below.  Lab Results  Component Value Date   TSH 2.301 08/12/2021   TSH 2.777 07/22/2021   TSH 3.330 07/01/2021   TSH 2.590 06/10/2021   TSH 1.960 05/20/2021    REVIEW OF SYSTEMS:  Rest of the pertinent 10 point ROS reviewed and negative except as mentioned above  COVID 19 VACCINATION STATUS: [place] x2, most recently 11/2019; infection 07/2020   HISTORY OF CURRENT ILLNESS:   Cancer Staging  Malignant neoplasm of upper-outer quadrant of right breast in female, estrogen receptor negative ([place]) Staging form: Breast, AJCC 8th Edition - Clinical: Stage IIB (cT1c, cN1, cM0, G3, ER-, PR-, HER2-) - Signed by [name], MD on  11/20/2020 Histologic grading system: 3 grade system - Pathologic stage from 05/08/2021: No Stage Recommended (ypT2, pN1a, cM0, G3, ER-, PR-, HER2-) - Signed by [name], NP on 05/21/2021 Stage prefix: Post-therapy Histologic grading system: 3 grade system  56 y.o. [place] woman status post right breast upper outer quadrant biopsy 11/08/2020 for a clinical T1c N1, stage IIB invasive ductal carcinoma, grade 3, functionally triple negative, with an MIB-1 of 30%  (a) right breast retroareolar biopsy 12/09/2020 shows invasive ductal carcinoma, grade 2  (1) genetics test 12/03/2020 through the [facility] [place] +RNAinsight Panel found no deleterious mutations in AIP, ALK, APC, ATM, AXIN2, BAP1, BARD1, BLM, BMPR1A, BRCA1, BRCA2, BRIP1, CDC73, CDH1, CDK4, CDKN1B, CDKN2A, CHEK2, CTNNA1, DICER1, FANCC, FH, FLCN, GALNT12, KIF1B, LZTR1, MAX, MEN1, MET, MLH1, MSH2, MSH3, MSH6, MUTYH, NBN, NF1, NF2, NTHL1, PALB2, PHOX2B, PMS2, POT1, PRKAR1A, PTCH1, PTEN, RAD51C, RAD51D, RB1, RECQL, RET, SDHA, SDHAF2, SDHB, SDHC, SDHD, SMAD4, SMARCA4, SMARCB1, SMARCE1, STK11, SUFU, TMEM127, TP53, TSC1, TSC2, VHL and XRCC2 (sequencing and deletion/duplication); EGFR, EGLN1, HOXB13, KIT, MITF, PDGFRA, POLD1, and POLE (sequencing only); EPCAM and GREM1 (deletion/duplication only).    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  Pathology showed 2.3 x 1.7 x 1.0 cm invasive ductal carcinoma, grade 3 of 3, treatment effect was robust in the breast and  minimal in lymph nodes.  All surgical margins  negative for invasive carcinoma, out of 4 examined the lymph nodes, 2 with micrometastasis and 0 with micrometastasis or isolated tumor cells.  Largest metastatic deposit in the lymph node was 4 mm and extranodal extension was present.  She is now undergoing adjuvant radiation.   PAST MEDICAL HISTORY: Past Medical History:  Diagnosis Date   Abdominal pain, generalized 12/27/2008   Acute sinusitis, unspecified 02/26/2007   ADD 03/13/2010   ALLERGIC RHINITIS 02/23/2007   ANEMIA 02/05/2007   Anxiety 11/17/2011   ATTENTION DEFICIT DISORDER, HX OF 02/05/2007   Breast cancer ([place])    Deviated nasal septum 02/05/2007   Diabetes mellitus without complication ([place])    Family history of breast cancer 11/20/2020   Family history of melanoma 11/20/2020   FATIGUE 02/23/2007   HYPERLIPIDEMIA 02/23/2007   Personal history of malignant melanoma 11/20/2020   SWELLING MASS OR LUMP IN HEAD AND NECK 07/27/2008   UTI 02/23/2007    PAST SURGICAL HISTORY: Past Surgical History:  Procedure Laterality Date   BREAST RECONSTRUCTION WITH PLACEMENT OF TISSUE EXPANDER AND FLEX HD (ACELLULAR HYDRATED DERMIS) Right 05/08/2021   Procedure: BREAST RECONSTRUCTION WITH PLACEMENT OF TISSUE EXPANDER AND FLEX HD (ACELLULAR HYDRATED DERMIS);  Surgeon: Cindra Presume, MD;  Location: [facility];  Service: Plastics;  Laterality: Right;   BROW LIFT     CESAREAN SECTION     COSMETIC SURGERY  1995   [place]   MASTECTOMY W/ SENTINEL NODE BIOPSY Right 05/08/2021   Procedure: RIGHT MASTECTOMY WITH SENTINEL LYMPH NODE BIOPSY;  Surgeon: Donnie Mesa, MD;  Location: [facility];  Service: General;  Laterality: Right;   MELANOMA EXCISION WITH SENTINEL LYMPH NODE BIOPSY Left 10/24/2012   Procedure: MELANOMA wide EXCISION left lateral thigh WITH SENTINEL LYMPH NODE BIOPSY left groin;  Surgeon: Edward Jolly, MD;  Location: [facility];  Service: General;  Laterality: Left;   NASAL  SEPTUM SURGERY     PORTACATH PLACEMENT Right 12/02/2020   Procedure: INSERTION PORT-A-CATH;  Surgeon: Donnie Mesa, MD;  Location: [facility];  Service: General;  Laterality: Right;   RADIOACTIVE SEED GUIDED AXILLARY SENTINEL LYMPH NODE Right 05/08/2021   Procedure: RADIOACTIVE SEED GUIDED AXILLARY SENTINEL LYMPH NODE DISSECTION;  Surgeon: Donnie Mesa, MD;  Location: [facility];  Service: General;  Laterality: Right;    FAMILY HISTORY: Family History  Problem Relation Age of Onset   Hypertension Mother    Breast cancer Mother 75   COPD Father    Atrial fibrillation Father    Atrial fibrillation Sister    Kidney cancer Maternal Aunt        dx 96s   Leukemia Paternal Aunt        dx after 62   Cancer Paternal Aunt        unknown type; dx after 81   Brain cancer Paternal Grandfather 73       brain tumor per pt   Melanoma Cousin        arm; no mets per pt   Alcohol abuse Other    Depression Other    Glaucoma Other    Breast cancer Other        MGM's sisters, x3, dx after 3   Colon polyps Neg Hx    Her father died at age 42 from COPD. Her mother is currently 29 years old as of 11/2020. Yesena has no full siblings and three half-siblings-- two  brothers and one sister. She reports breast cancer in her mother and three great-aunts, brain tumor in her paternal grandfather, and metastatic melanoma in a cousin.   GYNECOLOGIC HISTORY:  Patient's last menstrual period was 04/27/2016. Menarche: [age] years old Age at first live birth: 56 years old [place] P 2 LMP 2016 Contraceptive used for 1-2 years HRT estrogen cream irregularly Hysterectomy? no BSO? 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 She lives in [place], [place] but travels here frequently for work. Husband [name] works in Press photographer for [employer]. She lives at home with [name] and their daughter [name], who is 86 and is a Ship broker at  [employer]. Daughter [name], age 92, is a Secretary/administrator in [place]. [place], [place]. 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    ADVANCED DIRECTIVES: In the absence of any documentation to the contrary, the patient's spouse is their HCPOA.    HEALTH MAINTENANCE: Social History   Tobacco Use   Smoking status: Never   Smokeless tobacco: Never  Vaping Use   Vaping Use: Never used  Substance Use Topics   Alcohol use: Not Currently    Comment: Very rare occasions   Drug use: Never     Colonoscopy: 12/2014 (Dr. Henrene Pastor)  PAP: 2020  Bone density: unsure   Allergies  Allergen Reactions   Promethazine Hcl     Legs twitch    Current Outpatient Medications  Medication Sig Dispense Refill   amphetamine-dextroamphetamine (ADDERALL) 20 MG tablet Take 1.5 tabs by mouth twice per day 90 tablet 0   atorvastatin (LIPITOR) 20 MG tablet Take 1 tablet (20 mg total) by mouth daily. 90 tablet 3   metFORMIN (GLUCOPHAGE-XR) 500 MG 24 hr tablet Take 1 tablet (500 mg total) by mouth daily with breakfast. 90 tablet 3   No current facility-administered medications for this visit.   Facility-Administered Medications Ordered in Other Visits  Medication Dose Route Frequency Provider Last Rate Last Admin   heparin lock flush 100 unit/mL  500 Units Intracatheter Once PRN Deaira Leckey, Arletha Pili, MD       pembrolizumab (KEYTRUDA) 200 mg in sodium chloride 0.9 % 50 mL chemo infusion  200 mg Intravenous Once Janiesha Diehl, MD       sodium chloride flush (NS) 0.9 % injection 10 mL  10 mL Intracatheter PRN Zuzanna Maroney, Arletha Pili, MD        OBJECTIVE: White woman who appears younger than stated age  Vitals:   09/02/21 1303  BP: 115/79  Pulse: 90  Resp: 16  Temp: 98.1 F (36.7 C)  SpO2: 100%       Body mass index is 22.81 kg/m.   Wt Readings from Last 3 Encounters:  09/02/21 141 lb 4.8 oz (64.1 kg)  08/18/21 141 lb 4 oz (64.1 kg)  08/12/21 141 lb 12.8 oz (64.3 kg)     ECOG FS:1 - Symptomatic but completely  ambulatory  Physical Exam Constitutional:      Appearance: Normal appearance.  HENT:     Head: Normocephalic and atraumatic.  Cardiovascular:     Rate and Rhythm: Normal rate and regular rhythm.     Pulses: Normal pulses.     Heart sounds: Normal heart sounds.  Chest:     Comments: Breast exam deferred today Musculoskeletal:     Cervical back: Normal range of motion and neck supple. No rigidity.  Lymphadenopathy:     Cervical: No cervical adenopathy.  Skin:  General: Skin is warm and dry.  Neurological:     General: No focal deficit present.     Mental Status: She is alert.  Psychiatric:        Mood and Affect: Mood normal.     LAB RESULTS:  CMP     Component Value Date/Time   NA 142 09/02/2021 1246   K 3.8 09/02/2021 1246   CL 108 09/02/2021 1246   CO2 29 09/02/2021 1246   GLUCOSE 118 (H) 09/02/2021 1246   BUN 13 09/02/2021 1246   CREATININE 0.88 09/02/2021 1246   CREATININE 0.77 04/29/2021 0807   CALCIUM 9.4 09/02/2021 1246   PROT 6.8 09/02/2021 1246   ALBUMIN 3.9 09/02/2021 1246   AST 13 (L) 09/02/2021 1246   AST 13 (L) 04/29/2021 0807   ALT 10 09/02/2021 1246   ALT 9 04/29/2021 0807   ALKPHOS 86 09/02/2021 1246   BILITOT 0.4 09/02/2021 1246   BILITOT 0.3 04/29/2021 0807   GFRNONAA >60 09/02/2021 1246   GFRNONAA >60 04/29/2021 0807   GFRAA 100 03/23/2008 1212    No results found for: TOTALPROTELP, ALBUMINELP, A1GS, A2GS, BETS, BETA2SER, GAMS, MSPIKE, SPEI  Lab Results  Component Value Date   WBC 4.1 09/02/2021   NEUTROABS 3.0 09/02/2021   HGB 11.0 (L) 09/02/2021   HCT 31.4 (L) 09/02/2021   MCV 93.7 09/02/2021   PLT 184 09/02/2021    No results found for: LABCA2  No components found for: QMGQQP619  No results for input(s): INR in the last 168 hours.  No results found for: LABCA2  No results found for: JKD326  No results found for: ZTI458  No results found for: KDX833  No results found for: CA2729  No components found for:  HGQUANT  No results found for: CEA1 / No results found for: CEA1   No results found for: AFPTUMOR  No results found for: CHROMOGRNA  No results found for: KPAFRELGTCHN, LAMBDASER, KAPLAMBRATIO (kappa/lambda light chains)  No results found for: HGBA, HGBA2QUANT, HGBFQUANT, HGBSQUAN (Hemoglobinopathy evaluation)   No results found for: LDH  Lab Results  Component Value Date   IRON 93 12/16/2018   IRONPCTSAT 25.6 12/16/2018   (Iron and TIBC)  No results found for: FERRITIN  Urinalysis    Component Value Date/Time   COLORURINE YELLOW 12/20/2019 1521   APPEARANCEUR CLEAR 12/20/2019 1521   LABSPEC 1.017 12/20/2019 1521   PHURINE 6.5 12/20/2019 1521   GLUCOSEU NEGATIVE 12/20/2019 1521   GLUCOSEU NEGATIVE 12/16/2018 1512   HGBUR NEGATIVE 12/20/2019 1521   BILIRUBINUR NEGATIVE 12/16/2018 1512   KETONESUR NEGATIVE 12/20/2019 1521   PROTEINUR NEGATIVE 12/20/2019 1521   UROBILINOGEN 0.2 12/16/2018 1512   NITRITE NEGATIVE 12/20/2019 1521   LEUKOCYTESUR NEGATIVE 12/20/2019 1521    STUDIES: No results found.   ELIGIBLE FOR AVAILABLE RESEARCH PROTOCOL: no  ASSESSMENT: 56 y.o. [place] woman with T1c N1, stage IIB invasive ductal carcinoma, grade 3, functionally triple negative, with an MIB-1 of 30%   (1) genetics test 12/03/2020 through the [facility] +RNAinsight Panel found no deleterious mutations in AIP, ALK, APC, ATM, AXIN2, BAP1, BARD1, BLM, BMPR1A, BRCA1, BRCA2, BRIP1, CDC73, CDH1, CDK4, CDKN1B, CDKN2A, CHEK2, CTNNA1, DICER1, FANCC, FH, FLCN, GALNT12, KIF1B, LZTR1, MAX, MEN1, MET, MLH1, MSH2, MSH3, MSH6, MUTYH, NBN, NF1, NF2, NTHL1, PALB2, PHOX2B, PMS2, POT1, PRKAR1A, PTCH1, PTEN, RAD51C, RAD51D, RB1, RECQL, RET, SDHA, SDHAF2, SDHB, SDHC, SDHD, SMAD4, SMARCA4, SMARCB1, SMARCE1, STK11, SUFU, TMEM127, TP53, TSC1, TSC2, VHL and XRCC2 (sequencing and deletion/duplication); EGFR, EGLN1, HOXB13,  KIT, MITF, PDGFRA, POLD1, and POLE (sequencing only); EPCAM and  GREM1 (deletion/duplication only).    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  She underwent right mastectomy and targeted lymph node dissection, final pathology showing tumor measuring 2.3 cm in largest dimension, grade 3, negative margins with excellent response in the breast however minimal response in the lymph node, 2 out of 4 lymph nodes positive for involvement, extranodal extension present, largest metastasis is 4 mm  PLAN:  Ms. Choi is here for follow-up regarding adjuvant Keytruda after definitive surgery every 3 weeks for up to 9 cycles.  She has no adverse effects reported with Keytruda.   She is tolerating immunotherapy very well.  Okay to proceed with immunotherapy if labs are all within parameters. She had an ER 5% positive weak on the initial specimen but final pathology showed triple negative tumor hence she may not derive much benefit from antiestrogen therapy. We have today discussed about surveillance recommendations which included history, physical examination, no clear role for labs or imaging.  She should consider following up with [place] every 3 to 4 months for the first 2 years followed by every 6 to 12 months for the next 3 years.  She was instructed to report any persistent and unexplained symptoms for further investigation and she expressed understanding.  She will return to clinic in 6 weeks.  With regards to the sensitive mouth, she will call us and let us know the name of the medication she got from ENT for a one-time refill.  Total time spent: 30  minutes *Total Encounter Time as defined by the Centers for Medicare and Medicaid Services includes, in addition to the face-to-face time of a patient visit (documented in the note above) non-face-to-face time: obtaining and reviewing outside history, ordering and reviewing medications, tests or procedures, care coordination (communications with other health care professionals or caregivers) and documentation in the medical record.

## 2021-09-02 NOTE — Patient Instructions (Signed)
[facility] MEDICAL [facility]COLOG[facility]  Discharge Instructions: ?Thank you for choosing [facility] to provide your oncology and hematology care.  ? ?If you have a lab appointment with the Cancer Center, please go directly to the Cancer Center and check in at the registration area. ?  ?Wear comfortable clothing and clothing appropriate for easy access to any Portacath or PICC line.  ? ?We strive to give you quality time with your provider. You may need to reschedule your appointment if you arrive late (15 or more minutes).  Arriving late affects you and other patients whose appointments are after yours.  Also, if you miss three or more appointments without notifying the office, you may be dismissed from the clinic at the provider?s discretion.    ?  ?For prescription refill requests, have your pharmacy contact our office and allow 72 hours for refills to be completed.   ? ?Today you received the following chemotherapy and/or immunotherapy agents: Keytruda ?  ?To help prevent nausea and vomiting after your treatment, we encourage you to take your nausea medication as directed. ? ?BELOW ARE SYMPTOMS THAT SHOULD BE REPORTED IMMEDIATELY: ?*FEVER GREATER THAN 100.4 F (38 ?C) OR HIGHER ?*CHILLS OR SWEATING ?*NAUSEA AND VOMITING THAT IS NOT CONTROLLED WITH YOUR NAUSEA MEDICATION ?*UNUSUAL SHORTNESS OF BREATH ?*UNUSUAL BRUISING OR BLEEDING ?*URINARY PROBLEMS (pain or burning when urinating, or frequent urination) ?*BOWEL PROBLEMS (unusual diarrhea, constipation, pain near the anus) ?TENDERNESS IN MOUTH AND THROAT WITH OR WITHOUT PRESENCE OF ULCERS (sore throat, sores in mouth, or a toothache) ?UNUSUAL RASH, SWELLING OR PAIN  ?UNUSUAL VAGINAL DISCHARGE OR ITCHING  ? ?Items with * indicate a potential emergency and should be followed up as soon as possible or go to the Emergency Department if any problems should occur. ? ?Please show the CHEMOTHERAPY ALERT CARD or IMMUNOTHERAPY ALERT CARD at check-in to the  Emergency Department and triage nurse. ? ?Should you have questions after your visit or need to cancel or reschedule your appointment, please contact [facility]  Dept: [phone]  and follow the prompts.  Office hours are 8:00 a.m. to 4:30 p.m. Monday - Friday. Please note that voicemails left after 4:00 p.m. may not be returned until the following business day.  We are closed weekends and major holidays. You have access to a nurse at all times for urgent questions. Please call the main number to the clinic Dept: [phone] and follow the prompts. ? ? ?For any non-urgent questions, you may also contact your provider using MyChart. We now offer e-Visits for anyone 18 and older to request care online for non-urgent symptoms. For details visit [email or web address]. ?  ?Also download the MyChart app! Go to the app store, search "MyChart", open the app, select [facility], and log in with your MyChart username and password. ? ?Due to Covid, a mask is required upon entering the hospital/clinic. If you do not have a mask, one will be given to you upon arrival. For doctor visits, patients may have 1 support person aged 18 or older with them. For treatment visits, patients cannot have anyone with them due to current Covid guidelines and our immunocompromised population.  ? ?

## 2021-09-05 ENCOUNTER — Ambulatory Visit: Payer: Managed Care, Other (non HMO) | Admitting: Radiation Oncology

## 2021-09-05 ENCOUNTER — Telehealth: Payer: Self-pay

## 2021-09-05 NOTE — Telephone Encounter (Signed)
I called the patient today about her upcoming follow-up appointment in radiation oncology.   Given the state of the COVID-19 pandemic, concerning case numbers in our community, and guidance from [facility], I offered a phone assessment with the patient to determine if coming to the clinic was necessary. She accepted.  The patient denies any symptomatic concerns.  She reports mild fatigue in the afternoons, but states she has resumed working full time for a few weeks. She denies any lingering pain or discomfort to her chest; just mild tightness from her reconstruction/expanders. She continues to have slight range of motion limitations, but confirmed she's diligent about doing her PT stretches. Denies any swelling or signs of lymphedema to her right arm. Specifically, she reports good healing of her skin in the radiation fields.  She states she had some blistering and peeling within the treatment field, but her skin is now intact and slowly returning to her baseline. I recommended that she continue skin care by applying oil or lotion with vitamin E to the skin in the radiation fields, BID, for 2 more months.    Continue follow-up with medical oncology. She saw Dr. Chryl Heck on 09/02/2021, and will F/U with her again before patient's infusion scheduled this July  I explained that yearly mammograms are important for patients with intact breast tissue, and physical exams are important after mastectomy for patients that cannot undergo mammography.  I encouraged her to call if she had further questions or concerns about her healing. Otherwise, she will follow-up PRN in radiation oncology. Patient is pleased with this plan, and we will cancel her upcoming follow-up to reduce the risk of COVID-19 transmission.

## 2021-09-11 ENCOUNTER — Encounter: Payer: Self-pay | Admitting: *Deleted

## 2021-09-17 ENCOUNTER — Encounter: Payer: Self-pay | Admitting: Hematology and Oncology

## 2021-09-17 ENCOUNTER — Encounter: Payer: Self-pay | Admitting: Oncology

## 2021-09-17 NOTE — Progress Notes (Signed)
                                                                                                                                                             Patient Name: Robin Farley MRN: [id number] DOB: 10/09/1965 Referring Physician: JOHN JAMES (Profile Not Attached) Date of Service: 07/31/2021 [facility] [place]                                                        End Of Treatment Note  Diagnoses: C50.811-Malignant neoplasm of overlapping sites of right female breast  Cancer Staging:  Cancer Staging  Malignant neoplasm of upper-outer quadrant of right breast in female, estrogen receptor negative ([facility]) Staging form: Breast, AJCC 8th Edition - Clinical: Stage IIB (cT1c, cN1, cM0, G3, ER-, PR-, HER2-) - Signed by Magrinat, Gustav C, MD on 11/20/2020 Histologic grading system: 3 grade system - Pathologic stage from 05/08/2021: No Stage Recommended (ypT2, pN1a, cM0, G3, ER-, PR-, HER2-) - Signed by Causey, Lindsey Cornetto, NP on 05/21/2021 Stage prefix: Post-therapy Histologic grading system: 3 grade system   Intent: Curative  Radiation Treatment Dates: 06/23/2021 through 07/31/2021 Site Technique Total Dose (Gy) Dose per Fx (Gy) Completed Fx Beam Energies  Chest Wall, Right: CW_R_IMN 3D 50.4/50.4 1.8 28/28 6X  Chest Wall, Right: CW_R_PAB_SCV 3D 50.4/50.4 1.8 28/28 6X, 10X   Narrative: The patient tolerated radiation therapy relatively well.   Plan: The patient will follow-up with radiation oncology in 1mo and /or prn. -----------------------------------  Sarah Squire, MD  

## 2021-09-23 ENCOUNTER — Inpatient Hospital Stay: Payer: Managed Care, Other (non HMO) | Attending: Oncology

## 2021-09-23 ENCOUNTER — Encounter: Payer: Self-pay | Admitting: Hematology and Oncology

## 2021-09-23 ENCOUNTER — Other Ambulatory Visit: Payer: Self-pay | Admitting: Adult Health

## 2021-09-23 ENCOUNTER — Inpatient Hospital Stay: Payer: Managed Care, Other (non HMO)

## 2021-09-23 ENCOUNTER — Other Ambulatory Visit: Payer: Self-pay

## 2021-09-23 VITALS — BP 129/68 | HR 98 | Temp 98.1°F | Resp 16

## 2021-09-23 DIAGNOSIS — Z171 Estrogen receptor negative status [ER-]: Secondary | ICD-10-CM | POA: Insufficient documentation

## 2021-09-23 DIAGNOSIS — C773 Secondary and unspecified malignant neoplasm of axilla and upper limb lymph nodes: Secondary | ICD-10-CM | POA: Diagnosis present

## 2021-09-23 DIAGNOSIS — C50411 Malignant neoplasm of upper-outer quadrant of right female breast: Secondary | ICD-10-CM | POA: Insufficient documentation

## 2021-09-23 DIAGNOSIS — Z5112 Encounter for antineoplastic immunotherapy: Secondary | ICD-10-CM | POA: Insufficient documentation

## 2021-09-23 DIAGNOSIS — Z95828 Presence of other vascular implants and grafts: Secondary | ICD-10-CM

## 2021-09-23 LAB — CBC WITH DIFFERENTIAL/PLATELET
Abs Immature Granulocytes: 0.02 10*3/uL (ref 0.00–0.07)
Basophils Absolute: 0 10*3/uL (ref 0.0–0.1)
Basophils Relative: 1 %
Eosinophils Absolute: 0.2 10*3/uL (ref 0.0–0.5)
Eosinophils Relative: 4 %
HCT: 33.1 % — ABNORMAL LOW (ref 36.0–46.0)
Hemoglobin: 11.4 g/dL — ABNORMAL LOW (ref 12.0–15.0)
Immature Granulocytes: 1 %
Lymphocytes Relative: 13 %
Lymphs Abs: 0.5 10*3/uL — ABNORMAL LOW (ref 0.7–4.0)
MCH: 32.2 pg (ref 26.0–34.0)
MCHC: 34.4 g/dL (ref 30.0–36.0)
MCV: 93.5 fL (ref 80.0–100.0)
Monocytes Absolute: 0.4 10*3/uL (ref 0.1–1.0)
Monocytes Relative: 10 %
Neutro Abs: 3 10*3/uL (ref 1.7–7.7)
Neutrophils Relative %: 71 %
Platelets: 209 10*3/uL (ref 150–400)
RBC: 3.54 MIL/uL — ABNORMAL LOW (ref 3.87–5.11)
RDW: 11.7 % (ref 11.5–15.5)
WBC: 4.1 10*3/uL (ref 4.0–10.5)
nRBC: 0 % (ref 0.0–0.2)

## 2021-09-23 LAB — COMPREHENSIVE METABOLIC PANEL
ALT: 12 U/L (ref 0–44)
AST: 17 U/L (ref 15–41)
Albumin: 4.1 g/dL (ref 3.5–5.0)
Alkaline Phosphatase: 94 U/L (ref 38–126)
Anion gap: 8 (ref 5–15)
BUN: 10 mg/dL (ref 6–20)
CO2: 28 mmol/L (ref 22–32)
Calcium: 10 mg/dL (ref 8.9–10.3)
Chloride: 105 mmol/L (ref 98–111)
Creatinine, Ser: 0.82 mg/dL (ref 0.44–1.00)
GFR, Estimated: >60 mL/min (ref >60)
Glucose, Bld: 133 mg/dL — ABNORMAL HIGH (ref 70–99)
Potassium: 3.6 mmol/L (ref 3.5–5.1)
Sodium: 141 mmol/L (ref 135–145)
Total Bilirubin: 0.4 mg/dL (ref 0.3–1.2)
Total Protein: 7.3 g/dL (ref 6.5–8.1)

## 2021-09-23 LAB — TSH: TSH: 4.016 u[IU]/mL (ref 0.350–4.500)

## 2021-09-23 MED ORDER — HEPARIN SOD (PORK) LOCK FLUSH 100 UNIT/ML IV SOLN
500.0000 [IU] | Freq: Once | INTRAVENOUS | Status: AC | PRN
  Administered 2021-09-23: 500 [IU]

## 2021-09-23 MED ORDER — SODIUM CHLORIDE 0.9 % IV SOLN: Freq: Once | INTRAVENOUS | Status: AC

## 2021-09-23 MED ORDER — SODIUM CHLORIDE 0.9 % IV SOLN
200.0000 mg | Freq: Once | INTRAVENOUS | Status: AC
  Administered 2021-09-23: 200 mg via INTRAVENOUS
  Filled 2021-09-23: qty 200

## 2021-09-23 MED ORDER — SODIUM CHLORIDE 0.9% FLUSH
10.0000 mL | INTRAVENOUS | Status: DC | PRN
  Administered 2021-09-23: 10 mL

## 2021-09-23 NOTE — Patient Instructions (Signed)
[facility]  Discharge Instructions: ?Thank you for choosing [facility] to provide your oncology and hematology care.  ? ?If you have a lab appointment with the Cancer Center, please go directly to the Cancer Center and check in at the registration area. ?  ?Wear comfortable clothing and clothing appropriate for easy access to any Portacath or PICC line.  ? ?We strive to give you quality time with your provider. You may need to reschedule your appointment if you arrive late (15 or more minutes).  Arriving late affects you and other patients whose appointments are after yours.  Also, if you miss three or more appointments without notifying the office, you may be dismissed from the clinic at the provider?s discretion.    ?  ?For prescription refill requests, have your pharmacy contact our office and allow 72 hours for refills to be completed.   ? ?Today you received the following chemotherapy and/or immunotherapy agents: Keytruda ?  ?To help prevent nausea and vomiting after your treatment, we encourage you to take your nausea medication as directed. ? ?BELOW ARE SYMPTOMS THAT SHOULD BE REPORTED IMMEDIATELY: ?*FEVER GREATER THAN 100.4 F (38 ?C) OR HIGHER ?*CHILLS OR SWEATING ?*NAUSEA AND VOMITING THAT IS NOT CONTROLLED WITH YOUR NAUSEA MEDICATION ?*UNUSUAL SHORTNESS OF BREATH ?*UNUSUAL BRUISING OR BLEEDING ?*URINARY PROBLEMS (pain or burning when urinating, or frequent urination) ?*BOWEL PROBLEMS (unusual diarrhea, constipation, pain near the anus) ?TENDERNESS IN MOUTH AND THROAT WITH OR WITHOUT PRESENCE OF ULCERS (sore throat, sores in mouth, or a toothache) ?UNUSUAL RASH, SWELLING OR PAIN  ?UNUSUAL VAGINAL DISCHARGE OR ITCHING  ? ?Items with * indicate a potential emergency and should be followed up as soon as possible or go to the Emergency Department if any problems should occur. ? ?Please show the CHEMOTHERAPY ALERT CARD or IMMUNOTHERAPY ALERT CARD at check-in to the  Emergency Department and triage nurse. ? ?Should you have questions after your visit or need to cancel or reschedule your appointment, please contact [facility]  Dept: [phone]  and follow the prompts.  Office hours are 8:00 a.m. to 4:30 p.m. Monday - Friday. Please note that voicemails left after 4:00 p.m. may not be returned until the following business day.  We are closed weekends and major holidays. You have access to a nurse at all times for urgent questions. Please call the main number to the clinic Dept: [phone] and follow the prompts. ? ? ?For any non-urgent questions, you may also contact your provider using MyChart. We now offer e-Visits for anyone 18 and older to request care online for non-urgent symptoms. For details visit [email or web address]. ?  ?Also download the MyChart app! Go to the app store, search "MyChart", open the app, select [facility], and log in with your MyChart username and password. ? ?Due to Covid, a mask is required upon entering the hospital/clinic. If you do not have a mask, one will be given to you upon arrival. For doctor visits, patients may have 1 support person aged 18 or older with them. For treatment visits, patients cannot have anyone with them due to current Covid guidelines and our immunocompromised population.  ? ?

## 2021-10-13 ENCOUNTER — Inpatient Hospital Stay: Payer: Managed Care, Other (non HMO)

## 2021-10-13 ENCOUNTER — Inpatient Hospital Stay: Payer: Managed Care, Other (non HMO) | Attending: Oncology | Admitting: Hematology and Oncology

## 2021-10-13 ENCOUNTER — Encounter: Payer: Self-pay | Admitting: Hematology and Oncology

## 2021-10-13 ENCOUNTER — Other Ambulatory Visit: Payer: Self-pay

## 2021-10-13 VITALS — BP 122/57 | HR 71 | Temp 97.7°F | Resp 18 | Ht 66.0 in | Wt 142.9 lb

## 2021-10-13 DIAGNOSIS — Z171 Estrogen receptor negative status [ER-]: Secondary | ICD-10-CM

## 2021-10-13 DIAGNOSIS — Z79899 Other long term (current) drug therapy: Secondary | ICD-10-CM | POA: Insufficient documentation

## 2021-10-13 DIAGNOSIS — C50411 Malignant neoplasm of upper-outer quadrant of right female breast: Secondary | ICD-10-CM | POA: Insufficient documentation

## 2021-10-13 DIAGNOSIS — Z7984 Long term (current) use of oral hypoglycemic drugs: Secondary | ICD-10-CM | POA: Insufficient documentation

## 2021-10-13 DIAGNOSIS — Z9011 Acquired absence of right breast and nipple: Secondary | ICD-10-CM | POA: Diagnosis not present

## 2021-10-13 DIAGNOSIS — Z923 Personal history of irradiation: Secondary | ICD-10-CM | POA: Insufficient documentation

## 2021-10-13 DIAGNOSIS — C773 Secondary and unspecified malignant neoplasm of axilla and upper limb lymph nodes: Secondary | ICD-10-CM | POA: Diagnosis present

## 2021-10-13 DIAGNOSIS — Z95828 Presence of other vascular implants and grafts: Secondary | ICD-10-CM

## 2021-10-13 DIAGNOSIS — Z5112 Encounter for antineoplastic immunotherapy: Secondary | ICD-10-CM | POA: Diagnosis not present

## 2021-10-13 DIAGNOSIS — R059 Cough, unspecified: Secondary | ICD-10-CM | POA: Diagnosis not present

## 2021-10-13 DIAGNOSIS — C50811 Malignant neoplasm of overlapping sites of right female breast: Secondary | ICD-10-CM

## 2021-10-13 LAB — CBC WITH DIFFERENTIAL/PLATELET
Abs Immature Granulocytes: 0.01 10*3/uL (ref 0.00–0.07)
Basophils Absolute: 0 10*3/uL (ref 0.0–0.1)
Basophils Relative: 1 %
Eosinophils Absolute: 0.2 10*3/uL (ref 0.0–0.5)
Eosinophils Relative: 5 %
HCT: 31.9 % — ABNORMAL LOW (ref 36.0–46.0)
Hemoglobin: 10.9 g/dL — ABNORMAL LOW (ref 12.0–15.0)
Immature Granulocytes: 0 %
Lymphocytes Relative: 20 %
Lymphs Abs: 0.7 10*3/uL (ref 0.7–4.0)
MCH: 32 pg (ref 26.0–34.0)
MCHC: 34.2 g/dL (ref 30.0–36.0)
MCV: 93.5 fL (ref 80.0–100.0)
Monocytes Absolute: 0.4 10*3/uL (ref 0.1–1.0)
Monocytes Relative: 11 %
Neutro Abs: 2.1 10*3/uL (ref 1.7–7.7)
Neutrophils Relative %: 63 %
Platelets: 183 10*3/uL (ref 150–400)
RBC: 3.41 MIL/uL — ABNORMAL LOW (ref 3.87–5.11)
RDW: 12.5 % (ref 11.5–15.5)
WBC: 3.4 10*3/uL — ABNORMAL LOW (ref 4.0–10.5)
nRBC: 0 % (ref 0.0–0.2)

## 2021-10-13 LAB — COMPREHENSIVE METABOLIC PANEL
ALT: 16 U/L (ref 0–44)
AST: 17 U/L (ref 15–41)
Albumin: 4 g/dL (ref 3.5–5.0)
Alkaline Phosphatase: 81 U/L (ref 38–126)
Anion gap: 7 (ref 5–15)
BUN: 13 mg/dL (ref 6–20)
CO2: 26 mmol/L (ref 22–32)
Calcium: 9.4 mg/dL (ref 8.9–10.3)
Chloride: 109 mmol/L (ref 98–111)
Creatinine, Ser: 0.75 mg/dL (ref 0.44–1.00)
GFR, Estimated: >60 mL/min (ref >60)
Glucose, Bld: 89 mg/dL (ref 70–99)
Potassium: 3.9 mmol/L (ref 3.5–5.1)
Sodium: 142 mmol/L (ref 135–145)
Total Bilirubin: 0.3 mg/dL (ref 0.3–1.2)
Total Protein: 6.6 g/dL (ref 6.5–8.1)

## 2021-10-13 LAB — TSH: TSH: 5.003 u[IU]/mL — ABNORMAL HIGH (ref 0.350–4.500)

## 2021-10-13 MED ORDER — SODIUM CHLORIDE 0.9% FLUSH
10.0000 mL | INTRAVENOUS | Status: DC | PRN
  Administered 2021-10-13: 10 mL

## 2021-10-13 MED ORDER — SODIUM CHLORIDE 0.9 % IV SOLN: Freq: Once | INTRAVENOUS | Status: AC

## 2021-10-13 MED ORDER — HEPARIN SOD (PORK) LOCK FLUSH 100 UNIT/ML IV SOLN
500.0000 [IU] | Freq: Once | INTRAVENOUS | Status: AC | PRN
  Administered 2021-10-13: 500 [IU]

## 2021-10-13 MED ORDER — SODIUM CHLORIDE 0.9 % IV SOLN
200.0000 mg | Freq: Once | INTRAVENOUS | Status: AC
  Administered 2021-10-13: 200 mg via INTRAVENOUS
  Filled 2021-10-13: qty 200

## 2021-10-13 NOTE — Progress Notes (Signed)
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   She completed adjuvant immunotherapy She is here for a follow up. She is doing well overall. She reports some shooting pains in the leg where the melanoma was removed almost 8 years ago.  No lumpiness.  She has also noticed a mild cough that happens randomly.  No chest pain or shortness of breath. No change in bowel or urinary habits.  She is tolerating immunotherapy very well. Rest of the pertinent 10 point ROS reviewed and negative  We are following her TSH since she is receiving Pembrolizumab, levels are noted below.  Lab Results  Component Value Date   TSH 4.016 09/23/2021   TSH 2.602 09/02/2021   TSH 2.301 08/12/2021   TSH 2.777 07/22/2021   TSH 3.330 07/01/2021    REVIEW OF SYSTEMS:  Rest of the pertinent 10 point ROS reviewed and negative except as mentioned above  COVID 19 VACCINATION STATUS: Robin Farley x2, most recently 11/2019; infection 07/2020   HISTORY OF CURRENT ILLNESS:   Cancer Staging  Malignant neoplasm of upper-outer quadrant of right breast in female, estrogen receptor negative ([place]) Staging form: Breast, AJCC 8th Edition - Clinical: Stage IIB (cT1c, cN1, cM0, G3, ER-, PR-, HER2-) - Signed by [name], MD on  11/20/2020 Histologic grading system: 3 grade system - Pathologic stage from 05/08/2021: No Stage Recommended (ypT2, pN1a, cM0, G3, ER-, PR-, HER2-) - Signed by [name], NP on 05/21/2021 Stage prefix: Post-therapy Histologic grading system: 3 grade system  56 y.o. Robin Farley woman status post right breast upper outer quadrant biopsy 11/08/2020 for a clinical T1c N1, stage IIB invasive ductal carcinoma, grade 3, functionally triple negative, with an MIB-1 of 30%  (a) right breast retroareolar biopsy 12/09/2020 shows invasive ductal carcinoma, grade 2  (1) genetics test 12/03/2020 through the [facility] +RNAinsight Panel found no deleterious mutations in AIP, ALK, APC, ATM, AXIN2, BAP1, BARD1, BLM, BMPR1A, BRCA1, BRCA2, BRIP1, CDC73, CDH1, CDK4, CDKN1B, CDKN2A, CHEK2, CTNNA1, DICER1, FANCC, FH, FLCN, GALNT12, KIF1B, LZTR1, MAX, MEN1, MET, MLH1, MSH2, MSH3, MSH6, MUTYH, NBN, NF1, NF2, NTHL1, PALB2, PHOX2B, PMS2, POT1, PRKAR1A, PTCH1, PTEN, RAD51C, RAD51D, RB1, RECQL, RET, SDHA, SDHAF2, SDHB, SDHC, SDHD, SMAD4, SMARCA4, SMARCB1, SMARCE1, STK11, SUFU, TMEM127, TP53, TSC1, TSC2, VHL and XRCC2 (sequencing and deletion/duplication); EGFR, EGLN1, HOXB13, KIT, MITF, PDGFRA, POLD1, and POLE (sequencing only); EPCAM and GREM1 (deletion/duplication only).    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  Pathology showed 2.3 x 1.7 x 1.0 cm invasive ductal carcinoma, grade 3 of 3, treatment effect was  robust in the breast and minimal in lymph nodes.  All surgical margins  negative for invasive carcinoma, out of 4 examined the lymph nodes, 2 with micrometastasis and 0 with micrometastasis or isolated tumor cells.  Largest metastatic deposit in the lymph node was 4 mm and extranodal extension was present. She completed adjuvant radiation.  Interval History  She is here for follow up before adjuvant immunotherapy. She is doing well, reports some mild dry cough. Cough happens when she walks or exerts.   Sometimes it happens very randomly. NO diarrhea, fevers or chills. No headaches, double vision. Rest of the pertinent 10 point ROS reviewed and negative  PAST MEDICAL HISTORY: Past Medical History:  Diagnosis Date   Abdominal pain, generalized 12/27/2008   Acute sinusitis, unspecified 02/26/2007   ADD 03/13/2010   ALLERGIC RHINITIS 02/23/2007   ANEMIA 02/05/2007   Anxiety 11/17/2011   ATTENTION DEFICIT DISORDER, HX OF 02/05/2007   Breast cancer ([place])    Deviated nasal septum 02/05/2007   Diabetes mellitus without complication ([place])    Family history of breast cancer 11/20/2020   Family history of melanoma 11/20/2020   FATIGUE 02/23/2007   HYPERLIPIDEMIA 02/23/2007   Personal history of malignant melanoma 11/20/2020   SWELLING MASS OR LUMP IN HEAD AND NECK 07/27/2008   UTI 02/23/2007    PAST SURGICAL HISTORY: Past Surgical History:  Procedure Laterality Date   BREAST RECONSTRUCTION WITH PLACEMENT OF TISSUE EXPANDER AND FLEX HD (ACELLULAR HYDRATED DERMIS) Right 05/08/2021   Procedure: BREAST RECONSTRUCTION WITH PLACEMENT OF TISSUE EXPANDER AND FLEX HD (ACELLULAR HYDRATED DERMIS);  Surgeon: Cindra Presume, MD;  Location: [facility];  Service: Plastics;  Laterality: Right;   BROW LIFT     CESAREAN SECTION     COSMETIC SURGERY  1995   [place]   MASTECTOMY W/ SENTINEL NODE BIOPSY Right 05/08/2021   Procedure: RIGHT MASTECTOMY WITH SENTINEL LYMPH NODE BIOPSY;  Surgeon: Donnie Mesa, MD;  Location: [facility];  Service:  General;  Laterality: Right;   MELANOMA EXCISION WITH SENTINEL LYMPH NODE BIOPSY Left 10/24/2012   Procedure: MELANOMA wide EXCISION left lateral thigh WITH SENTINEL LYMPH NODE BIOPSY left groin;  Surgeon: Edward Jolly, MD;  Location: [facility];  Service: General;  Laterality: Left;   NASAL SEPTUM SURGERY     PORTACATH PLACEMENT Right 12/02/2020   Procedure: INSERTION PORT-A-CATH;  Surgeon: Donnie Mesa, MD;  Location: [facility];  Service: General;  Laterality: Right;   RADIOACTIVE SEED GUIDED AXILLARY SENTINEL LYMPH NODE Right 05/08/2021   Procedure: RADIOACTIVE SEED GUIDED AXILLARY SENTINEL LYMPH NODE DISSECTION;  Surgeon: Donnie Mesa, MD;  Location: [facility];  Service: General;  Laterality: Right;    FAMILY HISTORY: Family History  Problem Relation Age of Onset   Hypertension Mother    Breast cancer Mother 11   COPD Father    Atrial fibrillation Father    Atrial fibrillation Sister    Kidney cancer Maternal Aunt        dx 10s   Leukemia Paternal Aunt        dx after 32   Cancer Paternal Aunt        unknown type; dx after 32   Brain cancer Paternal Grandfather 73       brain tumor per pt   Melanoma Cousin        arm; no mets per pt   Alcohol abuse Other    Depression Other    Glaucoma  Other    Breast cancer Other        MGM's sisters, x3, dx after 43   Colon polyps Neg Hx    Her father died at age 30 from COPD. Her mother is currently 28 years old as of 11/2020. Nayely has no full siblings and three half-siblings-- two brothers and one sister. She reports breast cancer in her mother and three great-aunts, brain tumor in her paternal grandfather, and metastatic melanoma in a cousin.   GYNECOLOGIC HISTORY:  Patient's last menstrual period was 04/27/2016. Menarche: [age] years old Age at first live birth: 56 years old [place] P 2 LMP 2016 Contraceptive used for 1-2 years HRT estrogen cream irregularly Hysterectomy?  no BSO? 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 She lives in [place], [place] but travels here frequently for work. Husband [name] works in Press photographer for [employer]. She lives at home with [name] and their daughter [name], who is 40 and is a Ship broker at [employer]. Daughter [name], age 26, is a Secretary/administrator in [place]. [place]. Cordia is not a Designer, fashion/clothing.    ADVANCED DIRECTIVES: In the absence of any documentation to the contrary, the patient's spouse is their HCPOA.    HEALTH MAINTENANCE: Social History   Tobacco Use   Smoking status: Never   Smokeless tobacco: Never  Vaping Use   Vaping Use: Never used  Substance Use Topics   Alcohol use: Not Currently    Comment: Very rare occasions   Drug use: Never     Colonoscopy: 12/2014 (Dr. Henrene Pastor)  PAP: 2020  Bone density: unsure   Allergies  Allergen Reactions   Promethazine Hcl     Legs twitch    Current Outpatient Medications  Medication Sig Dispense Refill   amphetamine-dextroamphetamine (ADDERALL) 20 MG tablet Take 1.5 tabs by mouth twice per day 90 tablet 0   atorvastatin (LIPITOR) 20 MG tablet Take 1 tablet (20 mg total) by mouth daily. 90 tablet 3   metFORMIN (GLUCOPHAGE-XR) 500 MG 24 hr tablet Take 1 tablet (500 mg total) by mouth daily with breakfast. 90 tablet 3   No current facility-administered medications for this visit.   Facility-Administered Medications Ordered in Other Visits  Medication Dose Route Frequency Provider Last Rate Last Admin   sodium chloride flush (NS) 0.9 % injection 10 mL  10 mL Intracatheter PRN Benay Pike, MD        OBJECTIVE: White woman who appears younger than stated age  There were no vitals filed for this visit.      There is no height or weight on file to calculate BMI.   Wt Readings from Last 3 Encounters:  09/02/21 141 lb 4.8 oz (64.1 kg)  08/18/21 141 lb 4 oz (64.1 kg)  08/12/21 141 lb  12.8 oz (64.3 kg)     ECOG FS:1 - Symptomatic but completely ambulatory  Physical Exam Constitutional:      Appearance: Normal appearance.  HENT:     Head: Normocephalic and atraumatic.  Cardiovascular:     Rate and Rhythm: Normal rate and regular rhythm.     Pulses: Normal pulses.     Heart sounds: Normal heart sounds.  Chest:     Comments: Breast exam deferred today Musculoskeletal:     Cervical back: Normal range of motion and neck supple. No rigidity.  Lymphadenopathy:     Cervical: No cervical adenopathy.  Skin:    General: Skin  is warm and dry.  Neurological:     General: No focal deficit present.     Mental Status: She is alert.  Psychiatric:        Mood and Affect: Mood normal.      LAB RESULTS:  CMP     Component Value Date/Time   NA 141 09/23/2021 1330   K 3.6 09/23/2021 1330   CL 105 09/23/2021 1330   CO2 28 09/23/2021 1330   GLUCOSE 133 (H) 09/23/2021 1330   BUN 10 09/23/2021 1330   CREATININE 0.82 09/23/2021 1330   CREATININE 0.77 04/29/2021 0807   CALCIUM 10.0 09/23/2021 1330   PROT 7.3 09/23/2021 1330   ALBUMIN 4.1 09/23/2021 1330   AST 17 09/23/2021 1330   AST 13 (L) 04/29/2021 0807   ALT 12 09/23/2021 1330   ALT 9 04/29/2021 0807   ALKPHOS 94 09/23/2021 1330   BILITOT 0.4 09/23/2021 1330   BILITOT 0.3 04/29/2021 0807   GFRNONAA >60 09/23/2021 1330   GFRNONAA >60 04/29/2021 0807   GFRAA 100 03/23/2008 1212    No results found for: "TOTALPROTELP", "ALBUMINELP", "A1GS", "A2GS", "BETS", "BETA2SER", "GAMS", "MSPIKE", "SPEI"  Lab Results  Component Value Date   WBC 4.1 09/23/2021   NEUTROABS 3.0 09/23/2021   HGB 11.4 (L) 09/23/2021   HCT 33.1 (L) 09/23/2021   MCV 93.5 09/23/2021   PLT 209 09/23/2021    No results found for: "LABCA2"  No components found for: "XLKGMW102"  No results for input(s): "INR" in the last 168 hours.  No results found for: "LABCA2"  No results found for: "VOZ366"  No results found for: "CAN125"  No  results found for: "CAN153"  No results found for: "CA2729"  No components found for: "HGQUANT"  No results found for: "CEA1", "CEA" / No results found for: "CEA1", "CEA"   No results found for: "AFPTUMOR"  No results found for: "CHROMOGRNA"  No results found for: "KPAFRELGTCHN", "LAMBDASER", "KAPLAMBRATIO" (kappa/lambda light chains)  No results found for: "HGBA", "HGBA2QUANT", "HGBFQUANT", "HGBSQUAN" (Hemoglobinopathy evaluation)   No results found for: "LDH"  Lab Results  Component Value Date   IRON 93 12/16/2018   IRONPCTSAT 25.6 12/16/2018   (Iron and TIBC)  No results found for: "FERRITIN"  Urinalysis    Component Value Date/Time   COLORURINE YELLOW 12/20/2019 [place] 12/20/2019 1521   LABSPEC 1.017 12/20/2019 1521   PHURINE 6.5 12/20/2019 1521   GLUCOSEU NEGATIVE 12/20/2019 1521   GLUCOSEU NEGATIVE 12/16/2018 1512   HGBUR NEGATIVE 12/20/2019 1521   BILIRUBINUR NEGATIVE 12/16/2018 1512   KETONESUR NEGATIVE 12/20/2019 1521   PROTEINUR NEGATIVE 12/20/2019 1521   UROBILINOGEN 0.2 12/16/2018 1512   NITRITE NEGATIVE 12/20/2019 1521   LEUKOCYTESUR NEGATIVE 12/20/2019 1521    STUDIES: No results found.   ELIGIBLE FOR AVAILABLE RESEARCH PROTOCOL: no  ASSESSMENT: 56 y.o. [place] woman with T1c N1, stage IIB invasive ductal carcinoma, grade 3, functionally triple negative, with an MIB-1 of 30%   (1) genetics test 12/03/2020 through the [facility] +RNAinsight Panel found no deleterious mutations in AIP, ALK, APC, ATM, AXIN2, BAP1, BARD1, BLM, BMPR1A, BRCA1, BRCA2, BRIP1, CDC73, CDH1, CDK4, CDKN1B, CDKN2A, CHEK2, CTNNA1, DICER1, FANCC, FH, FLCN, GALNT12, KIF1B, LZTR1, MAX, MEN1, MET, MLH1, MSH2, MSH3, MSH6, MUTYH, NBN, NF1, NF2, NTHL1, PALB2, PHOX2B, PMS2, POT1, PRKAR1A, PTCH1, PTEN, RAD51C, RAD51D, RB1, RECQL, RET, SDHA, SDHAF2, SDHB, SDHC, SDHD, SMAD4, SMARCA4, SMARCB1, SMARCE1, STK11, SUFU, TMEM127, TP53, TSC1, TSC2, VHL and  XRCC2 (sequencing and deletion/duplication); EGFR, EGLN1,  HOXB13, KIT, MITF, PDGFRA, POLD1, and POLE (sequencing only); EPCAM and GREM1 (deletion/duplication only).    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  She underwent right mastectomy and targeted lymph node dissection, final pathology showing tumor measuring 2.3 cm in largest dimension, grade 3, negative margins with excellent response in the breast however minimal response in the lymph node, 2 out of 4 lymph nodes positive for involvement, extranodal extension present, largest metastasis is 4 mm  PLAN: She has 1 more cycle after today's immunotherapy to complete 9 cycles of adjuvant immunotherapy.  She is tolerating Keytruda very well, no side effects reported.  Her mouth sensitivity has almost resolved.  She is reluctant to use the steroid gel again.  She has been able to eat well.  Physical examination today without any concerns.  Her lungs are clear to auscultation today.  I have recommended that she monitor the cough and let us know if it persists for more than a couple weeks.  She expressed understanding. She had an ER 5% positive weak on the initial specimen but final pathology showed triple negative tumor hence she may not derive much benefit from antiestrogen therapy. We have once again discussed about surveillance recommendations.  Total time spent: 30 minutes  *Total Encounter Time as defined by the Centers for Medicare and Medicaid Services includes, in addition to the face-to-face time of a patient visit (documented in  the note above) non-face-to-face time: obtaining and reviewing outside history, ordering and reviewing medications, tests or procedures, care coordination (communications with other health care professionals or caregivers) and documentation in the medical record.

## 2021-10-13 NOTE — Patient Instructions (Signed)
[facility]  Discharge Instructions: ?Thank you for choosing [facility] to provide your oncology and hematology care.  ? ?If you have a lab appointment with the Cancer Center, please go directly to the Cancer Center and check in at the registration area. ?  ?Wear comfortable clothing and clothing appropriate for easy access to any Portacath or PICC line.  ? ?We strive to give you quality time with your provider. You may need to reschedule your appointment if you arrive late (15 or more minutes).  Arriving late affects you and other patients whose appointments are after yours.  Also, if you miss three or more appointments without notifying the office, you may be dismissed from the clinic at the provider?s discretion.    ?  ?For prescription refill requests, have your pharmacy contact our office and allow 72 hours for refills to be completed.   ? ?Today you received the following chemotherapy and/or immunotherapy agents: Keytruda ?  ?To help prevent nausea and vomiting after your treatment, we encourage you to take your nausea medication as directed. ? ?BELOW ARE SYMPTOMS THAT SHOULD BE REPORTED IMMEDIATELY: ?*FEVER GREATER THAN 100.4 F (38 ?C) OR HIGHER ?*CHILLS OR SWEATING ?*NAUSEA AND VOMITING THAT IS NOT CONTROLLED WITH YOUR NAUSEA MEDICATION ?*UNUSUAL SHORTNESS OF BREATH ?*UNUSUAL BRUISING OR BLEEDING ?*URINARY PROBLEMS (pain or burning when urinating, or frequent urination) ?*BOWEL PROBLEMS (unusual diarrhea, constipation, pain near the anus) ?TENDERNESS IN MOUTH AND THROAT WITH OR WITHOUT PRESENCE OF ULCERS (sore throat, sores in mouth, or a toothache) ?UNUSUAL RASH, SWELLING OR PAIN  ?UNUSUAL VAGINAL DISCHARGE OR ITCHING  ? ?Items with * indicate a potential emergency and should be followed up as soon as possible or go to the Emergency Department if any problems should occur. ? ?Please show the CHEMOTHERAPY ALERT CARD or IMMUNOTHERAPY ALERT CARD at check-in to the  Emergency Department and triage nurse. ? ?Should you have questions after your visit or need to cancel or reschedule your appointment, please contact [facility]  Dept: [phone]  and follow the prompts.  Office hours are 8:00 a.m. to 4:30 p.m. Monday - Friday. Please note that voicemails left after 4:00 p.m. may not be returned until the following business day.  We are closed weekends and major holidays. You have access to a nurse at all times for urgent questions. Please call the main number to the clinic Dept: [phone] and follow the prompts. ? ? ?For any non-urgent questions, you may also contact your provider using MyChart. We now offer e-Visits for anyone 18 and older to request care online for non-urgent symptoms. For details visit [email or web address]. ?  ?Also download the MyChart app! Go to the app store, search "MyChart", open the app, select [facility], and log in with your MyChart username and password. ? ?Due to Covid, a mask is required upon entering the hospital/clinic. If you do not have a mask, one will be given to you upon arrival. For doctor visits, patients may have 1 support Deforest Maiden aged 58 or older with them. For treatment visits, patients cannot have anyone with them due to current Covid guidelines and our immunocompromised population.  ? ?

## 2021-10-22 ENCOUNTER — Other Ambulatory Visit: Payer: Self-pay | Admitting: *Deleted

## 2021-10-22 DIAGNOSIS — R7989 Other specified abnormal findings of blood chemistry: Secondary | ICD-10-CM

## 2021-10-22 DIAGNOSIS — C50411 Malignant neoplasm of upper-outer quadrant of right female breast: Secondary | ICD-10-CM

## 2021-11-03 ENCOUNTER — Other Ambulatory Visit: Payer: Self-pay

## 2021-11-03 ENCOUNTER — Encounter: Payer: Self-pay | Admitting: *Deleted

## 2021-11-03 DIAGNOSIS — Z171 Estrogen receptor negative status [ER-]: Secondary | ICD-10-CM

## 2021-11-04 ENCOUNTER — Inpatient Hospital Stay: Payer: Managed Care, Other (non HMO)

## 2021-11-04 ENCOUNTER — Inpatient Hospital Stay (HOSPITAL_BASED_OUTPATIENT_CLINIC_OR_DEPARTMENT_OTHER): Payer: Managed Care, Other (non HMO) | Admitting: Adult Health

## 2021-11-04 ENCOUNTER — Encounter: Payer: Self-pay | Admitting: Adult Health

## 2021-11-04 ENCOUNTER — Ambulatory Visit: Payer: Self-pay | Admitting: Surgery

## 2021-11-04 ENCOUNTER — Other Ambulatory Visit: Payer: Self-pay

## 2021-11-04 VITALS — BP 103/55 | HR 83 | Temp 97.9°F | Resp 18 | Ht 66.0 in | Wt 143.5 lb

## 2021-11-04 DIAGNOSIS — C50411 Malignant neoplasm of upper-outer quadrant of right female breast: Secondary | ICD-10-CM

## 2021-11-04 DIAGNOSIS — Z171 Estrogen receptor negative status [ER-]: Secondary | ICD-10-CM

## 2021-11-04 DIAGNOSIS — Z95828 Presence of other vascular implants and grafts: Secondary | ICD-10-CM

## 2021-11-04 DIAGNOSIS — Z5112 Encounter for antineoplastic immunotherapy: Secondary | ICD-10-CM | POA: Diagnosis not present

## 2021-11-04 DIAGNOSIS — R7989 Other specified abnormal findings of blood chemistry: Secondary | ICD-10-CM

## 2021-11-04 LAB — COMPREHENSIVE METABOLIC PANEL
ALT: 17 U/L (ref 0–44)
AST: 17 U/L (ref 15–41)
Albumin: 4.1 g/dL (ref 3.5–5.0)
Alkaline Phosphatase: 83 U/L (ref 38–126)
Anion gap: 6 (ref 5–15)
BUN: 19 mg/dL (ref 6–20)
CO2: 27 mmol/L (ref 22–32)
Calcium: 9.3 mg/dL (ref 8.9–10.3)
Chloride: 108 mmol/L (ref 98–111)
Creatinine, Ser: 0.82 mg/dL (ref 0.44–1.00)
GFR, Estimated: >60 mL/min (ref >60)
Glucose, Bld: 92 mg/dL (ref 70–99)
Potassium: 4 mmol/L (ref 3.5–5.1)
Sodium: 141 mmol/L (ref 135–145)
Total Bilirubin: 0.4 mg/dL (ref 0.3–1.2)
Total Protein: 6.5 g/dL (ref 6.5–8.1)

## 2021-11-04 LAB — CBC WITH DIFFERENTIAL/PLATELET
Abs Immature Granulocytes: 0 10*3/uL (ref 0.00–0.07)
Basophils Absolute: 0 10*3/uL (ref 0.0–0.1)
Basophils Relative: 1 %
Eosinophils Absolute: 0.3 10*3/uL (ref 0.0–0.5)
Eosinophils Relative: 7 %
HCT: 33.9 % — ABNORMAL LOW (ref 36.0–46.0)
Hemoglobin: 11.5 g/dL — ABNORMAL LOW (ref 12.0–15.0)
Immature Granulocytes: 0 %
Lymphocytes Relative: 19 %
Lymphs Abs: 0.7 10*3/uL (ref 0.7–4.0)
MCH: 31.3 pg (ref 26.0–34.0)
MCHC: 33.9 g/dL (ref 30.0–36.0)
MCV: 92.4 fL (ref 80.0–100.0)
Monocytes Absolute: 0.3 10*3/uL (ref 0.1–1.0)
Monocytes Relative: 10 %
Neutro Abs: 2.3 10*3/uL (ref 1.7–7.7)
Neutrophils Relative %: 63 %
Platelets: 183 10*3/uL (ref 150–400)
RBC: 3.67 MIL/uL — ABNORMAL LOW (ref 3.87–5.11)
RDW: 12.8 % (ref 11.5–15.5)
WBC: 3.6 10*3/uL — ABNORMAL LOW (ref 4.0–10.5)
nRBC: 0 % (ref 0.0–0.2)

## 2021-11-04 LAB — TSH: TSH: 3.711 u[IU]/mL (ref 0.350–4.500)

## 2021-11-04 MED ORDER — SODIUM CHLORIDE 0.9% FLUSH
10.0000 mL | INTRAVENOUS | Status: DC | PRN
  Administered 2021-11-04: 10 mL

## 2021-11-04 MED ORDER — SODIUM CHLORIDE 0.9 % IV SOLN: Freq: Once | INTRAVENOUS | Status: AC

## 2021-11-04 MED ORDER — SODIUM CHLORIDE 0.9 % IV SOLN
200.0000 mg | Freq: Once | INTRAVENOUS | Status: AC
  Administered 2021-11-04: 200 mg via INTRAVENOUS
  Filled 2021-11-04: qty 200

## 2021-11-04 MED ORDER — HEPARIN SOD (PORK) LOCK FLUSH 100 UNIT/ML IV SOLN
500.0000 [IU] | Freq: Once | INTRAVENOUS | Status: AC | PRN
  Administered 2021-11-04: 500 [IU]

## 2021-11-04 NOTE — Assessment & Plan Note (Signed)
Robin Farley is a 56 year old woman with 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    Robin Farley is here to receive her final Pembrolizumab today which she has been tolerating well.  We reviewed her labs which have remained stable.    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   I discussed with Maryfrances the next steps which include port removal with Dr. Georgette Dover (message sent), and her survivorship care plan visit.   We will see her back in 8-12 weeks for her SCP visit.

## 2021-11-04 NOTE — Patient Instructions (Signed)
[facility] MEDICAL ONCOLOGY  Discharge Instructions: ?Thank you for choosing [facility] to provide your oncology and hematology care.  ? ?If you have a lab appointment with the Cancer Center, please go directly to the Cancer Center and check in at the registration area. ?  ?Wear comfortable clothing and clothing appropriate for easy access to any Portacath or PICC line.  ? ?We strive to give you quality time with your provider. You may need to reschedule your appointment if you arrive late (15 or more minutes).  Arriving late affects you and other patients whose appointments are after yours.  Also, if you miss three or more appointments without notifying the office, you may be dismissed from the clinic at the provider?s discretion.    ?  ?For prescription refill requests, have your pharmacy contact our office and allow 72 hours for refills to be completed.   ? ?Today you received the following chemotherapy and/or immunotherapy agents: Keytruda ?  ?To help prevent nausea and vomiting after your treatment, we encourage you to take your nausea medication as directed. ? ?BELOW ARE SYMPTOMS THAT SHOULD BE REPORTED IMMEDIATELY: ?*FEVER GREATER THAN 100.4 F (38 ?C) OR HIGHER ?*CHILLS OR SWEATING ?*NAUSEA AND VOMITING THAT IS NOT CONTROLLED WITH YOUR NAUSEA MEDICATION ?*UNUSUAL SHORTNESS OF BREATH ?*UNUSUAL BRUISING OR BLEEDING ?*URINARY PROBLEMS (pain or burning when urinating, or frequent urination) ?*BOWEL PROBLEMS (unusual diarrhea, constipation, pain near the anus) ?TENDERNESS IN MOUTH AND THROAT WITH OR WITHOUT PRESENCE OF ULCERS (sore throat, sores in mouth, or a toothache) ?UNUSUAL RASH, SWELLING OR PAIN  ?UNUSUAL VAGINAL DISCHARGE OR ITCHING  ? ?Items with * indicate a potential emergency and should be followed up as soon as possible or go to the Emergency Department if any problems should occur. ? ?Please show the CHEMOTHERAPY ALERT CARD or IMMUNOTHERAPY ALERT CARD at check-in to the  Emergency Department and triage nurse. ? ?Should you have questions after your visit or need to cancel or reschedule your appointment, please contact [facility]  Dept: [phone]  and follow the prompts.  Office hours are 8:00 a.m. to 4:30 p.m. Monday - Friday. Please note that voicemails left after 4:00 p.m. may not be returned until the following business day.  We are closed weekends and major holidays. You have access to a nurse at all times for urgent questions. Please call the main number to the clinic Dept: [phone] and follow the prompts. ? ? ?For any non-urgent questions, you may also contact your provider using MyChart. We now offer e-Visits for anyone 18 and older to request care online for non-urgent symptoms. For details visit [email or web address]. ?  ?Also download the MyChart app! Go to the app store, search "MyChart", open the app, select [facility], and log in with your MyChart username and password. ? ?Due to Covid, a mask is required upon entering the hospital/clinic. If you do not have a mask, one will be given to you upon arrival. For doctor visits, patients may have 1 support person aged 18 or older with them. For treatment visits, patients cannot have anyone with them due to current Covid guidelines and our immunocompromised population.  ? ?

## 2021-11-04 NOTE — Progress Notes (Signed)
[name]:    Robin Borg, MD [place] [id number]   DIAGNOSIS:  Cancer Staging  Malignant neoplasm of upper-outer quadrant of right breast in female, estrogen receptor negative ([place]) Staging form: Breast, AJCC 8th Edition - Clinical: Stage IIB (cT1c, cN1, cM0, G3, ER-, PR-, HER2-) - Signed by Robin Cruel, MD on 11/20/2020 Histologic grading system: 3 grade system - Pathologic stage from 05/08/2021: No Stage Recommended (ypT2, pN1a, cM0, G3, ER-, PR-, HER2-) - Signed by Robin Phlegm, NP on 05/21/2021 Stage prefix: Post-therapy Histologic grading system: 3 grade system   SUMMARY OF ONCOLOGIC HISTORY: Oncology History  Malignant neoplasm of upper-outer quadrant of right breast in female, estrogen receptor negative ([place])  11/12/2020 Initial Diagnosis   Malignant neoplasm of upper-outer quadrant of right breast in female, estrogen receptor positive ([place])   11/20/2020 Cancer Staging   Staging form: Breast, AJCC 8th Edition - Clinical: Stage IIB (cT1c, cN1, cM0, G3, ER-, PR-, HER2-) - Signed by Robin Cruel, MD on 11/20/2020 Histologic grading system: 3 grade system   12/03/2020 - 04/10/2021 Chemotherapy   Patient is on Treatment Plan : BREAST Pembrolizumab + Carboplatin D1 + Paclitaxel D1,8,15 q21d X 4 cycles / Pembrolizumab + AC q21d x 4 cycles     12/03/2020 Genetic Testing   Negative hereditary cancer genetic testing: no pathogenic variants detected in [facility] CancerNext-Expanded +RNAinsight Panel.  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  The report date is December 03, 2020.   The CancerNext-Expanded gene panel offered by [facility] and includes sequencing, rearrangement, and RNA analysis for the following 77 genes: AIP, ALK, APC, ATM, AXIN2, BAP1, BARD1, BLM, BMPR1A, BRCA1, BRCA2, BRIP1, CDC73, CDH1, CDK4, CDKN1B, CDKN2A, CHEK2, CTNNA1, DICER1, FANCC, FH, FLCN, GALNT12, KIF1B,  LZTR1, MAX, MEN1, MET, MLH1, MSH2, MSH3, MSH6, MUTYH, NBN, NF1, NF2, NTHL1, PALB2, PHOX2B, PMS2, POT1, PRKAR1A, PTCH1, PTEN, RAD51C, RAD51D, RB1, RECQL, RET, SDHA, SDHAF2, SDHB, SDHC, SDHD, SMAD4, SMARCA4, SMARCB1, SMARCE1, STK11, SUFU, TMEM127, TP53, TSC1, TSC2, VHL and XRCC2 (sequencing and deletion/duplication); EGFR, EGLN1, HOXB13, KIT, MITF, PDGFRA, POLD1, and POLE (sequencing only); EPCAM and GREM1 (deletion/duplication only).    04/29/2021 -  Chemotherapy   Patient is on Treatment Plan : BREAST Pembrolizumab (200) q21d x 27 weeks     05/08/2021 Cancer Staging   Staging form: Breast, AJCC 8th Edition - Pathologic stage from 05/08/2021: No Stage Recommended (ypT2, pN1a, cM0, G3, ER-, PR-, HER2-) - Signed by Robin Phlegm, NP on 05/21/2021 Stage prefix: Post-therapy Histologic grading system: 3 grade system   05/08/2021 Surgery   She had right mastectomy and targeted right axillary dissection on 05/08/2021.  Pathology showed 2.3 x 1.7 x 1.0 cm invasive ductal carcinoma, grade 3 of 3, treatment effect was robust in the breast and minimal in lymph nodes.  All surgical margins negative for invasive carcinoma, out of 4 examined the lymph nodes, 2 with micrometastasis and 0 with micrometastasis or isolated tumor cells.  Largest metastatic deposit in the lymph node was 4 mm and extranodal extension was present.   06/23/2021 - 07/31/2021 Radiation Therapy   06/23/2021 through 07/31/2021 Site Technique Total Dose (Gy) Dose per Fx (Gy) Completed Fx Beam Energies  Chest Wall, Right: CW_R_IMN 3D 50.4/50.4 1.8 28/28 6X  Chest Wall, Right: CW_R_PAB_SCV 3D 50.4/50.4 1.8 28/28 6X, 10X      CURRENT THERAPY: Pembrolizumab  INTERVAL HISTORY: Robin Farley 56 y.o. female returns for f/u prior  to her final Pembrolizumab.  She is feeling moderately well. She has some mild tingling in her fingertips that is residual from her initial chemotherpay. This is not particularly bothersome to her.   She is  healing from radiation well and has no other significant concerns today.    Patient Active Problem List   Diagnosis Date Noted   Elevated LFTs 12/10/2020   Genetic testing 12/06/2020   Port-A-Cath in place 12/03/2020   Family history of breast cancer 11/20/2020   Personal history of malignant melanoma 11/20/2020   Family history of melanoma 11/20/2020   Malignant neoplasm of upper-outer quadrant of right breast in female, estrogen receptor negative ([place]) 11/12/2020   Vitamin D deficiency 06/02/2019   Hyperglycemia 06/02/2019   Blood pressure elevated without history of HTN 06/02/2019   Headache 12/16/2018   Urinary incontinence, urge 12/14/2017   Calcific bursitis of shoulder 07/10/2015   Nausea alone 04/27/2013   Melanoma of thigh ([place]) 10/06/2012   Anxiety 11/17/2011   Rosacea 07/08/2011   Right shoulder pain 07/08/2011   Knee effusion, left 08/18/2010   Encounter for well adult exam with abnormal findings 08/18/2010   Attention deficit disorder 03/13/2010   GERD 12/27/2008   Hyperlipidemia 02/23/2007   DEPRESSION 02/23/2007   ALLERGIC RHINITIS 02/23/2007   FATIGUE 02/23/2007   ANEMIA 02/05/2007   Deviated nasal septum 02/05/2007    is allergic to promethazine hcl.  MEDICAL HISTORY: Past Medical History:  Diagnosis Date   Abdominal pain, generalized 12/27/2008   Acute sinusitis, unspecified 02/26/2007   ADD 03/13/2010   ALLERGIC RHINITIS 02/23/2007   ANEMIA 02/05/2007   Anxiety 11/17/2011   ATTENTION DEFICIT DISORDER, HX OF 02/05/2007   Breast cancer ([place])    Deviated nasal septum 02/05/2007   Diabetes mellitus without complication ([place])    Family history of breast cancer 11/20/2020   Family history of melanoma 11/20/2020   FATIGUE 02/23/2007   HYPERLIPIDEMIA 02/23/2007   Personal history of malignant melanoma 11/20/2020   SWELLING MASS OR LUMP IN HEAD AND NECK 07/27/2008   UTI 02/23/2007    SURGICAL HISTORY: Past Surgical History:  Procedure Laterality  Date   BREAST RECONSTRUCTION WITH PLACEMENT OF TISSUE EXPANDER AND FLEX HD (ACELLULAR HYDRATED DERMIS) Right 05/08/2021   Procedure: BREAST RECONSTRUCTION WITH PLACEMENT OF TISSUE EXPANDER AND FLEX HD (ACELLULAR HYDRATED DERMIS);  Surgeon: Cindra Presume, MD;  Location: [facility];  Service: Plastics;  Laterality: Right;   BROW LIFT     CESAREAN SECTION     COSMETIC SURGERY  1995   Nasal   MASTECTOMY W/ SENTINEL NODE BIOPSY Right 05/08/2021   Procedure: RIGHT MASTECTOMY WITH SENTINEL LYMPH NODE BIOPSY;  Surgeon: Donnie Mesa, MD;  Location: [facility];  Service: General;  Laterality: Right;   MELANOMA EXCISION WITH SENTINEL LYMPH NODE BIOPSY Left 10/24/2012   Procedure: MELANOMA wide EXCISION left lateral thigh WITH SENTINEL LYMPH NODE BIOPSY left groin;  Surgeon: Edward Jolly, MD;  Location: [facility];  Service: General;  Laterality: Left;   NASAL SEPTUM SURGERY     PORTACATH PLACEMENT Right 12/02/2020   Procedure: INSERTION PORT-A-CATH;  Surgeon: Donnie Mesa, MD;  Location: [facility];  Service: General;  Laterality: Right;   RADIOACTIVE SEED GUIDED AXILLARY SENTINEL LYMPH NODE Right 05/08/2021   Procedure: RADIOACTIVE SEED GUIDED AXILLARY SENTINEL LYMPH NODE DISSECTION;  Surgeon: Donnie Mesa, MD;  Location: [facility];  Service: General;  Laterality: Right;    SOCIAL HISTORY: Social History  Socioeconomic History   Marital status: Married    Spouse name: Not on file   Number of children: 2   Years of education: Not on file   Highest education level: Not on file  Occupational History   Occupation: master's in fine arts [employer]    Employer: [employer]  Tobacco Use   Smoking status: Never   Smokeless tobacco: Never  Vaping Use   Vaping Use: Never used  Substance and Sexual Activity   Alcohol use: Not Currently    Comment: Very rare occasions   Drug use: Never   Sexual activity:  Yes    Birth control/protection: None, Post-menopausal  Other Topics Concern   Not on file  Social History Narrative   Not on file   Social Determinants of Health   Financial Resource Strain: Low Risk  (11/20/2020)   Overall Financial Resource Strain (CARDIA)    Difficulty of Paying Living Expenses: Not hard at all  Food Insecurity: No Food Insecurity (11/20/2020)   Hunger Vital Sign    Worried About Running Out of Food in the Last Year: Never true    [place] in the Last Year: Never true  Transportation Needs: No Transportation Needs (11/20/2020)   PRAPARE - Hydrologist (Medical): No    Lack of Transportation (Non-Medical): No  Physical Activity: Not on file  Stress: Not on file  Social Connections: Not on file  Intimate Partner Violence: Not on file    FAMILY HISTORY: Family History  Problem Relation Age of Onset   Hypertension Mother    Breast cancer Mother 66   COPD Father    Atrial fibrillation Father    Atrial fibrillation Sister    Kidney cancer Maternal Aunt        dx 75s   Leukemia Paternal Aunt        dx after 36   Cancer Paternal Aunt        unknown type; dx after 66   Brain cancer Paternal Grandfather 27       brain tumor per pt   Melanoma Cousin        arm; no mets per pt   Alcohol abuse Other    Depression Other    Glaucoma Other    Breast cancer Other        MGM's sisters, x3, dx after 41   Colon polyps Neg Hx     Review of Systems  Constitutional:  Negative for appetite change, chills, fatigue, fever and unexpected weight change.  HENT:   Negative for hearing loss, lump/mass and trouble swallowing.   Eyes:  Negative for eye problems and icterus.  Respiratory:  Negative for chest tightness, cough and shortness of breath.   Cardiovascular:  Negative for chest pain, leg swelling and palpitations.  Gastrointestinal:  Negative for abdominal distention, abdominal pain, constipation, diarrhea, nausea and vomiting.   Endocrine: Negative for hot flashes.  Genitourinary:  Negative for difficulty urinating.   Musculoskeletal:  Negative for arthralgias.  Skin:  Negative for itching and rash.  Neurological:  Negative for dizziness, extremity weakness, headaches and numbness.  Hematological:  Negative for adenopathy. Does not bruise/bleed easily.  Psychiatric/Behavioral:  Negative for depression. The patient is not nervous/anxious.       PHYSICAL EXAMINATION  ECOG PERFORMANCE STATUS: 1 - Symptomatic but completely ambulatory  Vitals:   11/04/21 0844  BP: (!) 103/55  Pulse: 83  Resp: 18  Temp: 97.9 F (36.6 C)  SpO2: 100%    Physical Exam Constitutional:      General: She is not in acute distress.    Appearance: Normal appearance. She is not toxic-appearing.  HENT:     Head: Normocephalic and atraumatic.  Eyes:     General: No scleral icterus. Cardiovascular:     Rate and Rhythm: Normal rate and regular rhythm.     Pulses: Normal pulses.     Heart sounds: Normal heart sounds.  Pulmonary:     Effort: Pulmonary effort is normal.     Breath sounds: Normal breath sounds.  Chest:     Comments: Right breast s/p mastectomy, reconstruction and radiaition, slight erythema to breast, no swelling or skin peeling, no sign of local recurrence Abdominal:     General: Abdomen is flat. Bowel sounds are normal. There is no distension.     Palpations: Abdomen is soft.     Tenderness: There is no abdominal tenderness.  Musculoskeletal:        General: No swelling.     Cervical back: Neck supple.  Lymphadenopathy:     Cervical: No cervical adenopathy.  Skin:    General: Skin is warm and dry.     Findings: No rash.  Neurological:     General: No focal deficit present.     Mental Status: She is alert.  Psychiatric:        Mood and Affect: Mood normal.        Behavior: Behavior normal.     LABORATORY DATA:  CBC    Component Value Date/Time   WBC 3.6 (L) 11/04/2021 0817   RBC 3.67 (L)  11/04/2021 0817   HGB 11.5 (L) 11/04/2021 0817   HGB 9.0 (L) 04/29/2021 0807   HCT 33.9 (L) 11/04/2021 0817   PLT 183 11/04/2021 0817   PLT 352 04/29/2021 0807   MCV 92.4 11/04/2021 0817   MCH 31.3 11/04/2021 0817   MCHC 33.9 11/04/2021 0817   RDW 12.8 11/04/2021 0817   LYMPHSABS 0.7 11/04/2021 0817   MONOABS 0.3 11/04/2021 0817   EOSABS 0.3 11/04/2021 0817   BASOSABS 0.0 11/04/2021 0817    CMP     Component Value Date/Time   NA 141 11/04/2021 0817   K 4.0 11/04/2021 0817   CL 108 11/04/2021 0817   CO2 27 11/04/2021 0817   GLUCOSE 92 11/04/2021 0817   BUN 19 11/04/2021 0817   CREATININE 0.82 11/04/2021 0817   CREATININE 0.77 04/29/2021 0807   CALCIUM 9.3 11/04/2021 0817   PROT 6.5 11/04/2021 0817   ALBUMIN 4.1 11/04/2021 0817   AST 17 11/04/2021 0817   AST 13 (L) 04/29/2021 0807   ALT 17 11/04/2021 0817   ALT 9 04/29/2021 0807   ALKPHOS 83 11/04/2021 0817   BILITOT 0.4 11/04/2021 0817   BILITOT 0.3 04/29/2021 0807   GFRNONAA >60 11/04/2021 0817   GFRNONAA >60 04/29/2021 0807   GFRAA 100 03/23/2008 1212       ASSESSMENT and THERAPY PLAN:   Malignant neoplasm of upper-outer quadrant of right breast in female, estrogen receptor negative ([place]) Basil is a 56 year old woman with 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    Rogan is here to receive her final Pembrolizumab today which she has been tolerating well.  We reviewed her labs which have remained stable.    Her skin is healing well from radiation, and she has some mild residual chemotherapy induced peripheral neruopathy tthat  is tolerable for her.   I discussed with Karron the next steps which include port removal with Dr. Georgette Dover (message sent), and her survivorship care plan visit.   We will see her back in 8-12 weeks for her SCP visit.      All questions were answered. The patient knows to call the  clinic with any problems, questions or concerns. We can certainly see the patient much sooner if necessary.  Total encounter time:20 minutes*in face-to-face visit time, chart review, lab review, care coordination, order entry, and documentation of the encounter time.    Wilber Bihari, NP 11/04/21 9:27 AM Medical Oncology and Hematology [facility] [place], [place] [address] Tel. [phone]    Fax. [phone]  *Total Encounter Time as defined by the Centers for Medicare and Medicaid Services includes, in addition to the face-to-face time of a patient visit (documented in the note above) non-face-to-face time: obtaining and reviewing outside history, ordering and reviewing medications, tests or procedures, care coordination (communications with other health care professionals or caregivers) and documentation in the medical record.

## 2021-11-05 ENCOUNTER — Telehealth: Payer: Self-pay | Admitting: Hematology and Oncology

## 2021-11-05 NOTE — Telephone Encounter (Signed)
Scheduled appointment per 7/25 los. Left voicemail.

## 2021-11-06 LAB — T4: T4, Total: 8 ug/dL (ref 4.5–12.0)

## 2021-12-22 ENCOUNTER — Ambulatory Visit: Payer: Managed Care, Other (non HMO) | Attending: Hematology and Oncology

## 2021-12-22 VITALS — Wt 153.1 lb

## 2021-12-22 DIAGNOSIS — Z483 Aftercare following surgery for neoplasm: Secondary | ICD-10-CM | POA: Insufficient documentation

## 2021-12-22 NOTE — Therapy (Signed)
OUTPATIENT PHYSICAL THERAPY SOZO SCREENING NOTE   Patient Name: Robin Farley MRN: [id number] DOB:September 12, 1965, 56 y.o., female Today's Date: 12/22/2021  PCP: [name], MD REFERRING PROVIDER: Benay Pike, MD   PT End of Session - 12/22/21 1516     Visit Number 2   # unchanged due to screen only   PT Start Time 55    PT Stop Time 1525    PT Time Calculation (min) 12 min    Activity Tolerance Patient tolerated treatment well    Behavior During Therapy [facility] for tasks assessed/performed             Past Medical History:  Diagnosis Date   Abdominal pain, generalized 12/27/2008   Acute sinusitis, unspecified 02/26/2007   ADD 03/13/2010   ALLERGIC RHINITIS 02/23/2007   ANEMIA 02/05/2007   Anxiety 11/17/2011   ATTENTION DEFICIT DISORDER, HX OF 02/05/2007   Breast cancer ([place])    Deviated nasal septum 02/05/2007   Diabetes mellitus without complication ([place])    Family history of breast cancer 11/20/2020   Family history of melanoma 11/20/2020   FATIGUE 02/23/2007   HYPERLIPIDEMIA 02/23/2007   Personal history of malignant melanoma 11/20/2020   SWELLING MASS OR LUMP IN HEAD AND NECK 07/27/2008   UTI 02/23/2007   Past Surgical History:  Procedure Laterality Date   BREAST RECONSTRUCTION WITH PLACEMENT OF TISSUE EXPANDER AND FLEX HD (ACELLULAR HYDRATED DERMIS) Right 05/08/2021   Procedure: BREAST RECONSTRUCTION WITH PLACEMENT OF TISSUE EXPANDER AND FLEX HD (ACELLULAR HYDRATED DERMIS);  Surgeon: Cindra Presume, MD;  Location: [facility];  Service: Plastics;  Laterality: Right;   BROW LIFT     CESAREAN SECTION     COSMETIC SURGERY  1995   Nasal   MASTECTOMY W/ SENTINEL NODE BIOPSY Right 05/08/2021   Procedure: RIGHT MASTECTOMY WITH SENTINEL LYMPH NODE BIOPSY;  Surgeon: Donnie Mesa, MD;  Location: [facility];  Service: General;  Laterality: Right;   MELANOMA EXCISION WITH SENTINEL LYMPH NODE BIOPSY Left 10/24/2012   Procedure: MELANOMA  wide EXCISION left lateral thigh WITH SENTINEL LYMPH NODE BIOPSY left groin;  Surgeon: Edward Jolly, MD;  Location: [facility];  Service: General;  Laterality: Left;   NASAL SEPTUM SURGERY     PORTACATH PLACEMENT Right 12/02/2020   Procedure: INSERTION PORT-A-CATH;  Surgeon: Donnie Mesa, MD;  Location: [facility];  Service: General;  Laterality: Right;   RADIOACTIVE SEED GUIDED AXILLARY SENTINEL LYMPH NODE Right 05/08/2021   Procedure: RADIOACTIVE SEED GUIDED AXILLARY SENTINEL LYMPH NODE DISSECTION;  Surgeon: Donnie Mesa, MD;  Location: [facility];  Service: General;  Laterality: Right;   Patient Active Problem List   Diagnosis Date Noted   Elevated LFTs 12/10/2020   Genetic testing 12/06/2020   Port-A-Cath in place 12/03/2020   Family history of breast cancer 11/20/2020   Personal history of malignant melanoma 11/20/2020   Family history of melanoma 11/20/2020   Malignant neoplasm of upper-outer quadrant of right breast in female, estrogen receptor negative ([place]) 11/12/2020   Vitamin D deficiency 06/02/2019   Hyperglycemia 06/02/2019   Blood pressure elevated without history of HTN 06/02/2019   Headache 12/16/2018   Urinary incontinence, urge 12/14/2017   Calcific bursitis of shoulder 07/10/2015   Nausea alone 04/27/2013   Melanoma of thigh ([place]) 10/06/2012   Anxiety 11/17/2011   Rosacea 07/08/2011   Right shoulder pain 07/08/2011   Knee effusion, left 08/18/2010   Encounter for well adult exam with  abnormal findings 08/18/2010   Attention deficit disorder 03/13/2010   GERD 12/27/2008   Hyperlipidemia 02/23/2007   DEPRESSION 02/23/2007   ALLERGIC RHINITIS 02/23/2007   FATIGUE 02/23/2007   ANEMIA 02/05/2007   Deviated nasal septum 02/05/2007    REFERRING DIAG: right breast cancer at risk for lymphedema  THERAPY DIAG: Aftercare following surgery for neoplasm  PERTINENT HISTORY: Pt was diagnosed with 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  She had neoadjuvant chemo, and had  a right mastectomy with immediate expander on 05/08/2021. She will be having radiation.   PRECAUTIONS: right UE Lymphedema risk, None  SUBJECTIVE: Pt returns for her 3 month L-Dex screen.   PAIN:  Are you having pain? No  SOZO SCREENING: Patient was assessed today using the SOZO machine to determine the lymphedema index score. This was compared to her baseline score. It was determined that she is within the recommended range when compared to her baseline and no further action is needed at this time. She will continue SOZO screenings. These are done every 3 months for 2 years post operatively followed by every 6 months for 2 years, and then annually.   L-DEX FLOWSHEETS - 12/22/21 1500       L-DEX LYMPHEDEMA SCREENING   Measurement Type Unilateral    L-DEX MEASUREMENT EXTREMITY Upper Extremity    POSITION  Standing    DOMINANT SIDE Right    At Risk Side Right    BASELINE SCORE (UNILATERAL) 0.9    L-DEX SCORE (UNILATERAL) 1.3    VALUE CHANGE (UNILAT) 0.4              Otelia Limes, PTA 12/22/2021, 3:25 PM

## 2022-01-12 ENCOUNTER — Telehealth: Payer: Self-pay | Admitting: Plastic Surgery

## 2022-01-12 NOTE — Telephone Encounter (Signed)
LVM to reschedule appt due to provider leaving practice.  Ask pt to call back asap to get rescheduled.

## 2022-01-14 ENCOUNTER — Encounter: Payer: Self-pay | Admitting: Adult Health

## 2022-01-14 ENCOUNTER — Ambulatory Visit (HOSPITAL_COMMUNITY)
Admission: RE | Admit: 2022-01-14 | Discharge: 2022-01-14 | Disposition: A | Discharge status: Home / Self Care | Payer: Managed Care, Other (non HMO) | Source: Ambulatory Visit | Attending: Adult Health | Admitting: Adult Health

## 2022-01-14 ENCOUNTER — Inpatient Hospital Stay: Payer: Managed Care, Other (non HMO) | Attending: Hematology and Oncology | Admitting: Adult Health

## 2022-01-14 ENCOUNTER — Telehealth: Payer: Self-pay | Admitting: Adult Health

## 2022-01-14 VITALS — BP 134/60 | HR 75 | Temp 98.1°F | Resp 16 | Ht 66.0 in | Wt 154.2 lb

## 2022-01-14 DIAGNOSIS — Z171 Estrogen receptor negative status [ER-]: Secondary | ICD-10-CM | POA: Insufficient documentation

## 2022-01-14 DIAGNOSIS — Z9011 Acquired absence of right breast and nipple: Secondary | ICD-10-CM | POA: Diagnosis not present

## 2022-01-14 DIAGNOSIS — E119 Type 2 diabetes mellitus without complications: Secondary | ICD-10-CM | POA: Diagnosis not present

## 2022-01-14 DIAGNOSIS — Z7984 Long term (current) use of oral hypoglycemic drugs: Secondary | ICD-10-CM | POA: Insufficient documentation

## 2022-01-14 DIAGNOSIS — C50411 Malignant neoplasm of upper-outer quadrant of right female breast: Secondary | ICD-10-CM | POA: Insufficient documentation

## 2022-01-14 DIAGNOSIS — Z79899 Other long term (current) drug therapy: Secondary | ICD-10-CM | POA: Insufficient documentation

## 2022-01-14 DIAGNOSIS — Z923 Personal history of irradiation: Secondary | ICD-10-CM | POA: Insufficient documentation

## 2022-01-14 DIAGNOSIS — C773 Secondary and unspecified malignant neoplasm of axilla and upper limb lymph nodes: Secondary | ICD-10-CM | POA: Insufficient documentation

## 2022-01-14 NOTE — Telephone Encounter (Signed)
Scheduled appointment per 10/4 los. Patient is aware.

## 2022-01-14 NOTE — Progress Notes (Unsigned)
SURVIVORSHIP VISIT:     BRIEF ONCOLOGIC HISTORY:  Oncology History  Malignant neoplasm of upper-outer quadrant of right breast in female, estrogen receptor negative ([name])  11/12/2020 Initial Diagnosis   Malignant neoplasm of upper-outer quadrant of right breast in female, estrogen receptor positive ([name])   11/20/2020 Cancer Staging   Staging form: Breast, AJCC 8th Edition - Clinical: Stage IIB (cT1c, cN1, cM0, G3, ER-, PR-, HER2-) - Signed by [name], MD on 11/20/2020 Histologic grading system: 3 grade system   12/03/2020 - 04/10/2021 Chemotherapy   Patient is on Treatment Plan : BREAST Pembrolizumab + Carboplatin D1 + Paclitaxel D1,8,15 q21d X 4 cycles / Pembrolizumab + AC q21d x 4 cycles     12/03/2020 Genetic Testing   Negative hereditary cancer genetic testing: no pathogenic variants detected in [facility] CancerNext-Expanded +RNAinsight Panel.  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  The report date is December 03, 2020.   The CancerNext-Expanded gene panel offered by [facility] and includes sequencing, rearrangement, and RNA analysis for the following 77 genes: AIP, ALK, APC, ATM, AXIN2, BAP1, BARD1, BLM, BMPR1A, BRCA1, BRCA2, BRIP1, CDC73, CDH1, CDK4, CDKN1B, CDKN2A, CHEK2, CTNNA1, DICER1, FANCC, FH, FLCN, GALNT12, KIF1B, LZTR1, MAX, MEN1, MET, MLH1, MSH2, MSH3, MSH6, MUTYH, NBN, NF1, NF2, NTHL1, PALB2, PHOX2B, PMS2, POT1, PRKAR1A, PTCH1, PTEN, RAD51C, RAD51D, RB1, RECQL, RET, SDHA, SDHAF2, SDHB, SDHC, SDHD, SMAD4, SMARCA4, SMARCB1, SMARCE1, STK11, SUFU, TMEM127, TP53, TSC1, TSC2, VHL and XRCC2 (sequencing and deletion/duplication); EGFR, EGLN1, HOXB13, KIT, MITF, PDGFRA, POLD1, and POLE (sequencing only); EPCAM and GREM1 (deletion/duplication only).    04/29/2021 - 11/04/2021 Chemotherapy   Patient is on Treatment Plan : BREAST Pembrolizumab (200) q21d x 27 weeks     05/08/2021 Cancer Staging   Staging form: Breast, AJCC 8th Edition - Pathologic  stage from 05/08/2021: No Stage Recommended (ypT2, pN1a, cM0, G3, ER-, PR-, HER2-) - Signed by [name], NP on 05/21/2021 Stage prefix: Post-therapy Histologic grading system: 3 grade system   05/08/2021 Surgery   She had right mastectomy and targeted right axillary dissection on 05/08/2021.  Pathology showed 2.3 x 1.7 x 1.0 cm invasive ductal carcinoma, grade 3 of 3, treatment effect was robust in the breast and minimal in lymph nodes.  All surgical margins negative for invasive carcinoma, out of 4 examined the lymph nodes, 2 with micrometastasis and 0 with micrometastasis or isolated tumor cells.  Largest metastatic deposit in the lymph node was 4 mm and extranodal extension was present.   06/23/2021 - 07/31/2021 Radiation Therapy   06/23/2021 through 07/31/2021 Site Technique Total Dose (Gy) Dose per Fx (Gy) Completed Fx Beam Energies  Chest Wall, Right: CW_R_IMN 3D 50.4/50.4 1.8 28/28 6X  Chest Wall, Right: CW_R_PAB_SCV 3D 50.4/50.4 1.8 28/28 6X, 10X      INTERVAL HISTORY:  Robin Farley to review her survivorship care plan detailing her treatment course for breast cancer, as well as monitoring long-term side effects of that treatment, education regarding health maintenance, screening, and overall wellness and health promotion.     Overall, Robin Farley reports feeling quite well.  She is having some vaginal dryness.  She is also experiencing some occasional intermittent lower back pain.  This has been going on for about 6-8 months.    REVIEW OF SYSTEMS:  Review of Systems  Constitutional:  Negative for appetite change, chills, fatigue, fever and unexpected weight change.  HENT:   Negative for hearing loss, lump/mass and trouble swallowing.   Eyes:  Negative  for eye problems and icterus.  Respiratory:  Negative for chest tightness, cough and shortness of breath.   Cardiovascular:  Negative for chest pain, leg swelling and palpitations.  Gastrointestinal:  Negative for abdominal  distention, abdominal pain, constipation, diarrhea, nausea and vomiting.  Endocrine: Negative for hot flashes.  Genitourinary:  Negative for difficulty urinating.   Musculoskeletal:  Positive for back pain. Negative for arthralgias.  Skin:  Negative for itching and rash.  Neurological:  Negative for dizziness, extremity weakness, headaches and numbness.  Hematological:  Negative for adenopathy. Does not bruise/bleed easily.  Psychiatric/Behavioral:  Negative for depression. The patient is not nervous/anxious.   Breast: Denies any new nodularity, masses, tenderness, nipple changes, or nipple discharge.      ONCOLOGY TREATMENT TEAM:  1. Surgeon:  Dr. Georgette Dover at [facility] 2. Medical Oncologist: Dr. Chryl Heck  3. Radiation Oncologist: Dr. Isidore Moos    PAST MEDICAL/SURGICAL HISTORY:  Past Medical History:  Diagnosis Date   Abdominal pain, generalized 12/27/2008   Acute sinusitis, unspecified 02/26/2007   ADD 03/13/2010   ALLERGIC RHINITIS 02/23/2007   ANEMIA 02/05/2007   Anxiety 11/17/2011   ATTENTION DEFICIT DISORDER, HX OF 02/05/2007   Breast cancer ([place])    Deviated nasal septum 02/05/2007   Diabetes mellitus without complication ([place])    Family history of breast cancer 11/20/2020   Family history of melanoma 11/20/2020   FATIGUE 02/23/2007   HYPERLIPIDEMIA 02/23/2007   Personal history of malignant melanoma 11/20/2020   Port-A-Cath in place 12/03/2020   SWELLING MASS OR LUMP IN HEAD AND NECK 07/27/2008   UTI 02/23/2007   Past Surgical History:  Procedure Laterality Date   BREAST RECONSTRUCTION WITH PLACEMENT OF TISSUE EXPANDER AND FLEX HD (ACELLULAR HYDRATED DERMIS) Right 05/08/2021   Procedure: BREAST RECONSTRUCTION WITH PLACEMENT OF TISSUE EXPANDER AND FLEX HD (ACELLULAR HYDRATED DERMIS);  Surgeon: Cindra Presume, MD;  Location: [place];  Service: Plastics;  Laterality: Right;   BROW LIFT     CESAREAN SECTION     COSMETIC SURGERY  1995   Nasal    MASTECTOMY W/ SENTINEL NODE BIOPSY Right 05/08/2021   Procedure: RIGHT MASTECTOMY WITH SENTINEL LYMPH NODE BIOPSY;  Surgeon: Donnie Mesa, MD;  Location: [place];  Service: General;  Laterality: Right;   MELANOMA EXCISION WITH SENTINEL LYMPH NODE BIOPSY Left 10/24/2012   Procedure: MELANOMA wide EXCISION left lateral thigh WITH SENTINEL LYMPH NODE BIOPSY left groin;  Surgeon: Edward Jolly, MD;  Location: [place];  Service: General;  Laterality: Left;   NASAL SEPTUM SURGERY     PORTACATH PLACEMENT Right 12/02/2020   Procedure: INSERTION PORT-A-CATH;  Surgeon: Donnie Mesa, MD;  Location: [facility];  Service: General;  Laterality: Right;   RADIOACTIVE SEED GUIDED AXILLARY SENTINEL LYMPH NODE Right 05/08/2021   Procedure: RADIOACTIVE SEED GUIDED AXILLARY SENTINEL LYMPH NODE DISSECTION;  Surgeon: Donnie Mesa, MD;  Location: [place];  Service: General;  Laterality: Right;     ALLERGIES:  Allergies  Allergen Reactions   Promethazine Hcl     Legs twitch     CURRENT MEDICATIONS:  Outpatient Encounter Medications as of 01/14/2022  Medication Sig   amphetamine-dextroamphetamine (ADDERALL) 20 MG tablet Take 1.5 tabs by mouth twice per day   atorvastatin (LIPITOR) 20 MG tablet Take 1 tablet (20 mg total) by mouth daily.   metFORMIN (GLUCOPHAGE-XR) 500 MG 24 hr tablet Take 1 tablet (500 mg total) by mouth daily with breakfast.   No  facility-administered encounter medications on file as of 01/14/2022.     ONCOLOGIC FAMILY HISTORY:  Family History  Problem Relation Age of Onset   Hypertension Mother    Breast cancer Mother 61   COPD Father    Atrial fibrillation Father    Atrial fibrillation Sister    Kidney cancer Maternal Aunt        dx 61s   Leukemia Paternal Aunt        dx after 45   Cancer Paternal Aunt        unknown type; dx after 44   Brain cancer Paternal Grandfather 73       brain tumor per pt    Melanoma Cousin        arm; no mets per pt   Alcohol abuse Other    Depression Other    Glaucoma Other    Breast cancer Other        MGM's sisters, x3, dx after 8   Colon polyps Neg Hx      SOCIAL HISTORY:  Social History   Socioeconomic History   Marital status: Married    Spouse name: Not on file   Number of children: 2   Years of education: Not on file   Highest education level: Not on file  Occupational History   Occupation: Brewing technologist in fine arts [employer]    Employer: [employer]  Tobacco Use   Smoking status: Never   Smokeless tobacco: Never  Vaping Use   Vaping Use: Never used  Substance and Sexual Activity   Alcohol use: Not Currently    Comment: Very rare occasions   Drug use: Never   Sexual activity: Yes    Birth control/protection: None, Post-menopausal  Other Topics Concern   Not on file  Social History Narrative   Not on file   Social Determinants of Health   Financial Resource Strain: Low Risk  (11/20/2020)   Overall Financial Resource Strain (CARDIA)    Difficulty of Paying Living Expenses: Not hard at all  Food Insecurity: No Food Insecurity (11/20/2020)   Hunger Vital Sign    Worried About Running Out of Food in the Last Year: Never true    [place] in the Last Year: Never true  Transportation Needs: No Transportation Needs (11/20/2020)   PRAPARE - Hydrologist (Medical): No    Lack of Transportation (Non-Medical): No  Physical Activity: Not on file  Stress: Not on file  Social Connections: Not on file  Intimate Partner Violence: Not on file     OBSERVATIONS/OBJECTIVE:  BP 134/60 (BP Location: Left Arm, Patient Position: Sitting)   Pulse 75   Temp 98.1 F (36.7 C) (Temporal)   Resp 16   Ht $R'5\' 6"'tB$  (1.676 m)   Wt 154 lb 3.2 oz (69.9 kg)   LMP 04/27/2016   SpO2 100%   BMI 24.89 kg/m  GENERAL: Patient is a well appearing female in no acute distress HEENT:  Sclerae anicteric.  Oropharynx  clear and moist. No ulcerations or evidence of oropharyngeal candidiasis. Neck is supple.  NODES:  No cervical, supraclavicular, or axillary lymphadenopathy palpated.  BREAST EXAM: Status post right breast mastectomy and reconstruction along with radiation no sign of local recurrence left breast is benign. LUNGS:  Clear to auscultation bilaterally.  No wheezes or rhonchi. HEART:  Regular rate and rhythm. No murmur appreciated. ABDOMEN:  Soft, nontender.  Positive, normoactive bowel sounds. No organomegaly palpated. MSK:  No  focal spinal tenderness to palpation. Full range of motion bilaterally in the upper extremities. EXTREMITIES:  No peripheral edema.   SKIN:  Clear with no obvious rashes or skin changes. No nail dyscrasia. NEURO:  Nonfocal. Well oriented.  Appropriate affect.   LABORATORY DATA:  None for this visit.  DIAGNOSTIC IMAGING:  None for this visit.      ASSESSMENT AND PLAN:  Ms.. Farley is a pleasant 56 y.o. 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  She presents to the Survivorship Clinic for our initial meeting and routine follow-up post-completion of treatment for breast cancer.    1. Stage 2B right breast cancer:  Robin Farley is continuing to recover from definitive treatment for breast cancer. She will follow-up with her medical oncologist, Dr. Chryl Heck in 3 months with history and physical exam per surveillance protocol.  She was recommended to continue with annual left breast mammograms.  Today, a comprehensive survivorship care plan and treatment summary was reviewed with the patient today detailing her breast cancer diagnosis, treatment course, potential late/long-term effects of treatment, appropriate follow-up care with recommendations for the future, and patient education resources.  A copy of this summary, along with  a letter will be sent to the patient's primary care provider via mail/fax/In Basket message after today's visit.    2.  Back pain: This sounds like it could likely be musculoskeletal however we will get plain films of the lumbar and thoracic spine today.  3. Bone health:   She was given education on specific activities to promote bone health.  4. Cancer screening:  Due to Robin Farley's history and her age, she should receive screening for skin cancers, colon cancer, and gynecologic cancers.  The information and recommendations are listed on the patient's comprehensive care plan/treatment summary and were reviewed in detail with the patient.    5. Health maintenance and wellness promotion: Robin Farley was encouraged to consume 5-7 servings of fruits and vegetables per day. We reviewed the "Nutrition Rainbow" handout.  She was also encouraged to engage in moderate to vigorous exercise for 30 minutes per day most days of the week. We discussed the [employer] fitness program, which is designed for cancer survivors to help them become more physically fit after cancer treatments.  She was instructed to limit her alcohol consumption and continue to abstain from tobacco use.     6. Support services/counseling: It is not uncommon for this period of the patient's cancer care trajectory to be one of many emotions and stressors.  She was given information regarding our available services and encouraged to contact me with any questions or for help enrolling in any of our support group/programs.    Follow up instructions:    -Return to cancer center in 3 months for follow-up with Dr. Chryl Heck -She is welcome to return back to the Survivorship Clinic at any time; no additional follow-up needed at this time.  -Consider referral back to survivorship as a long-term survivor for continued surveillance  The patient was provided an opportunity to ask questions and all were answered. The patient agreed with the plan and  demonstrated an understanding of the instructions.   Total encounter time:30 minutes*in face-to-face visit time, chart review, lab review, care coordination, order entry, and documentation of the encounter time.    Wilber Bihari, NP 01/15/22 12:56 PM Medical Oncology and Hematology [facility] [place]  [place], [place] [address] Tel. [phone]    Fax. [phone]  *Total Encounter Time as defined by the Centers for Medicare and Medicaid Services includes, in addition to the face-to-face time of a patient visit (documented in the note above) non-face-to-face time: obtaining and reviewing outside history, ordering and reviewing medications, tests or procedures, care coordination (communications with other health care professionals or caregivers) and documentation in the medical record.

## 2022-01-19 ENCOUNTER — Other Ambulatory Visit: Payer: Self-pay | Admitting: Adult Health

## 2022-01-28 ENCOUNTER — Other Ambulatory Visit: Payer: Self-pay | Admitting: Hematology and Oncology

## 2022-01-28 DIAGNOSIS — C50411 Malignant neoplasm of upper-outer quadrant of right female breast: Secondary | ICD-10-CM

## 2022-01-29 ENCOUNTER — Telehealth: Payer: Self-pay | Admitting: *Deleted

## 2022-01-29 NOTE — Telephone Encounter (Signed)
This RN scheduled CT per MD for thoracic spine to follow up xray for Monday 10/23 at 4 pm at [facility] to pt and obtained her VM- message left per above as well if not appropriate time for her to call central scheduling at [phone].

## 2022-02-02 ENCOUNTER — Ambulatory Visit (HOSPITAL_COMMUNITY)
Admission: RE | Admit: 2022-02-02 | Discharge: 2022-02-02 | Disposition: A | Discharge status: Home / Self Care | Payer: Managed Care, Other (non HMO) | Source: Ambulatory Visit | Attending: Hematology and Oncology | Admitting: Hematology and Oncology

## 2022-02-02 DIAGNOSIS — Z171 Estrogen receptor negative status [ER-]: Secondary | ICD-10-CM | POA: Insufficient documentation

## 2022-02-02 DIAGNOSIS — C50411 Malignant neoplasm of upper-outer quadrant of right female breast: Secondary | ICD-10-CM | POA: Diagnosis present

## 2022-02-02 MED ORDER — SODIUM CHLORIDE (PF) 0.9 % IJ SOLN
INTRAMUSCULAR | Status: AC
  Filled 2022-02-02: qty 50

## 2022-02-02 MED ORDER — IOHEXOL 300 MG/ML  SOLN
100.0000 mL | Freq: Once | INTRAMUSCULAR | Status: AC | PRN
  Administered 2022-02-02: 100 mL via INTRAVENOUS

## 2022-02-09 ENCOUNTER — Encounter: Payer: Self-pay | Admitting: Plastic Surgery

## 2022-02-09 ENCOUNTER — Ambulatory Visit: Payer: Managed Care, Other (non HMO) | Admitting: Plastic Surgery

## 2022-02-09 ENCOUNTER — Institutional Professional Consult (permissible substitution): Payer: Managed Care, Other (non HMO) | Admitting: Plastic Surgery

## 2022-02-09 VITALS — BP 106/59 | HR 86 | Ht 66.0 in | Wt 157.0 lb

## 2022-02-09 DIAGNOSIS — Z923 Personal history of irradiation: Secondary | ICD-10-CM | POA: Diagnosis not present

## 2022-02-09 DIAGNOSIS — Z9889 Other specified postprocedural states: Secondary | ICD-10-CM

## 2022-02-09 DIAGNOSIS — C50911 Malignant neoplasm of unspecified site of right female breast: Secondary | ICD-10-CM

## 2022-02-09 DIAGNOSIS — Z9011 Acquired absence of right breast and nipple: Secondary | ICD-10-CM

## 2022-02-09 NOTE — Progress Notes (Signed)
Referring Provider [name], MD [address] [place],  [place] [address]   CC:  Chief Complaint  Patient presents with   Advice Only      Robin Farley is an 56 y.o. female.  HPI: Ms. Fill returns today for evaluation and completion of her breast reconstruction.  She had a tissue expander placed after her mastectomy and has undergone subsequent radiation therapy.  Ronnald Nian finished in April of this year and she is ready for the next stage which is removal of the tissue expander and placement of an implant as well as augmentation mastopexy on the left side.  Allergies  Allergen Reactions   Promethazine Hcl     Legs twitch    Outpatient Encounter Medications as of 02/09/2022  Medication Sig   amphetamine-dextroamphetamine (ADDERALL) 20 MG tablet Take 1.5 tabs by mouth twice per day   atorvastatin (LIPITOR) 20 MG tablet Take 1 tablet (20 mg total) by mouth daily.   metFORMIN (GLUCOPHAGE-XR) 500 MG 24 hr tablet Take 1 tablet (500 mg total) by mouth daily with breakfast.   No facility-administered encounter medications on file as of 02/09/2022.     Past Medical History:  Diagnosis Date   Abdominal pain, generalized 12/27/2008   Acute sinusitis, unspecified 02/26/2007   ADD 03/13/2010   ALLERGIC RHINITIS 02/23/2007   ANEMIA 02/05/2007   Anxiety 11/17/2011   ATTENTION DEFICIT DISORDER, HX OF 02/05/2007   Breast cancer ([place])    Deviated nasal septum 02/05/2007   Diabetes mellitus without complication ([place])    Family history of breast cancer 11/20/2020   Family history of melanoma 11/20/2020   FATIGUE 02/23/2007   HYPERLIPIDEMIA 02/23/2007   Personal history of malignant melanoma 11/20/2020   Port-A-Cath in place 12/03/2020   SWELLING MASS OR LUMP IN HEAD AND NECK 07/27/2008   UTI 02/23/2007    Past Surgical History:  Procedure Laterality Date   BREAST RECONSTRUCTION WITH PLACEMENT OF TISSUE EXPANDER AND FLEX HD (ACELLULAR HYDRATED DERMIS) Right 05/08/2021    Procedure: BREAST RECONSTRUCTION WITH PLACEMENT OF TISSUE EXPANDER AND FLEX HD (ACELLULAR HYDRATED DERMIS);  Surgeon: Cindra Presume, MD;  Location: [place];  Service: Plastics;  Laterality: Right;   BROW LIFT     CESAREAN SECTION     COSMETIC SURGERY  1995   Nasal   MASTECTOMY W/ SENTINEL NODE BIOPSY Right 05/08/2021   Procedure: RIGHT MASTECTOMY WITH SENTINEL LYMPH NODE BIOPSY;  Surgeon: Donnie Mesa, MD;  Location: [place];  Service: General;  Laterality: Right;   MELANOMA EXCISION WITH SENTINEL LYMPH NODE BIOPSY Left 10/24/2012   Procedure: MELANOMA wide EXCISION left lateral thigh WITH SENTINEL LYMPH NODE BIOPSY left groin;  Surgeon: Edward Jolly, MD;  Location: [place];  Service: General;  Laterality: Left;   NASAL SEPTUM SURGERY     PORTACATH PLACEMENT Right 12/02/2020   Procedure: INSERTION PORT-A-CATH;  Surgeon: Donnie Mesa, MD;  Location: [place];  Service: General;  Laterality: Right;   RADIOACTIVE SEED GUIDED AXILLARY SENTINEL LYMPH NODE Right 05/08/2021   Procedure: RADIOACTIVE SEED GUIDED AXILLARY SENTINEL LYMPH NODE DISSECTION;  Surgeon: Donnie Mesa, MD;  Location: [place];  Service: General;  Laterality: Right;    Family History  Problem Relation Age of Onset   Hypertension Mother    Breast cancer Mother 17   COPD Father    Atrial fibrillation Father    Atrial fibrillation Sister    Kidney cancer Maternal Aunt  dx 73s   Leukemia Paternal Aunt        dx after 8   Cancer Paternal Aunt        unknown type; dx after 51   Brain cancer Paternal Grandfather 73       brain tumor per pt   Melanoma Cousin        arm; no mets per pt   Alcohol abuse Other    Depression Other    Glaucoma Other    Breast cancer Other        MGM's sisters, x3, dx after 35   Colon polyps Neg Hx     Social History   Social History Narrative   Not on file     Review of  Systems General: Denies fevers, chills, weight loss CV: Denies chest pain, shortness of breath, palpitations Breast: The right breast is surgically absent.  She denies any issues such as drainage or wound complications.  Physical Exam    02/09/2022    3:47 PM 01/14/2022   10:43 AM 12/22/2021    3:16 PM  Vitals with BMI  Height '5\' 6"'$  '5\' 6"'$    Weight 157 lbs 154 lbs 3 oz 153 lbs 2 oz  BMI 77.82 42.3   Systolic 536 144   Diastolic 59 60   Pulse 86 75     General:  No acute distress,  Alert and oriented, Non-Toxic, Normal speech and affect Breast: The right breast is surgically absent with a well-healed transverse mastectomy scar.  The skin is extremely tight and firm presumably due to her radiation therapy.  The left breast is ptotic and somewhat deflated.  However the skin is good quality and would do well with a mastopexy and small implant.   Assessment/Plan Breast reconstruction: We discussed the surgical options and timing.  I am concerned about the right side given how tight the skin is and have encouraged her to undergo this is a two-stage procedure with removal of the tissue expander and placement of the implant and after healing undergoing the mastopexy augmentation on the left for a balancing procedure.  While she was disappointed I believe she understands that this is for her own safety as I have told her the radiated side has a significant risk of failure with replacement of the tissue expander.  I will plan for revision of the capsule to get more ptosis on the inferior aspect of the breast.  We will schedule at her request  Camillia Herter 02/09/2022, 4:27 PM

## 2022-02-24 ENCOUNTER — Telehealth: Payer: Self-pay | Admitting: *Deleted

## 2022-02-24 NOTE — Telephone Encounter (Signed)
Auth submitted via fax:  This message was sent via [place], a product from [employer] [email or web address]                    -------Fax Transmission Report-------  To:               Recipient at [phone] Subject:          [Secure] Prior Auth request for [employer] - [id number] Result:           The transmission was successful. Explanation:      All Pages Ok Pages Sent:       18 Connect Time:     19 minutes, 23 seconds Transmit Time:    02/24/2022 20:33 Transfer Rate:    14400 Status Code:      0000 Retry Count:      0 Job Id:           [id number] Unique Id:        [id number] Fax Line:         38 Fax Server:       [employer]

## 2022-03-19 ENCOUNTER — Encounter: Payer: Self-pay | Admitting: Hematology and Oncology

## 2022-03-23 ENCOUNTER — Ambulatory Visit: Payer: Managed Care, Other (non HMO) | Attending: Hematology and Oncology

## 2022-03-23 VITALS — Wt 162.0 lb

## 2022-03-23 DIAGNOSIS — Z483 Aftercare following surgery for neoplasm: Secondary | ICD-10-CM | POA: Insufficient documentation

## 2022-03-23 NOTE — Therapy (Signed)
OUTPATIENT PHYSICAL THERAPY [facility] SCREENING NOTE   Patient Name: Robin Farley MRN: [id number] DOB:Sep 08, 1965, 56 y.o., female Today's Date: 03/23/2022  PCP: [name], MD REFERRING PROVIDER: Benay Pike, MD   PT End of Session - 03/23/22 1556     Visit Number 2   # unchanged due to screen only   PT Start Time 1555    PT Stop Time 1559    PT Time Calculation (min) 4 min    Activity Tolerance Patient tolerated treatment well    Behavior During Therapy [facility] for tasks assessed/performed             Past Medical History:  Diagnosis Date   Abdominal pain, generalized 12/27/2008   Acute sinusitis, unspecified 02/26/2007   ADD 03/13/2010   ALLERGIC RHINITIS 02/23/2007   ANEMIA 02/05/2007   Anxiety 11/17/2011   ATTENTION DEFICIT DISORDER, HX OF 02/05/2007   Breast cancer ([place])    Deviated nasal septum 02/05/2007   Diabetes mellitus without complication ([place])    Family history of breast cancer 11/20/2020   Family history of melanoma 11/20/2020   FATIGUE 02/23/2007   HYPERLIPIDEMIA 02/23/2007   Personal history of malignant melanoma 11/20/2020   Port-A-Cath in place 12/03/2020   SWELLING MASS OR LUMP IN HEAD AND NECK 07/27/2008   UTI 02/23/2007   Past Surgical History:  Procedure Laterality Date   BREAST RECONSTRUCTION WITH PLACEMENT OF TISSUE EXPANDER AND FLEX HD (ACELLULAR HYDRATED DERMIS) Right 05/08/2021   Procedure: BREAST RECONSTRUCTION WITH PLACEMENT OF TISSUE EXPANDER AND FLEX HD (ACELLULAR HYDRATED DERMIS);  Surgeon: Cindra Presume, MD;  Location: [place];  Service: Plastics;  Laterality: Right;   BROW LIFT     CESAREAN SECTION     COSMETIC SURGERY  1995   Nasal   MASTECTOMY W/ SENTINEL NODE BIOPSY Right 05/08/2021   Procedure: RIGHT MASTECTOMY WITH SENTINEL LYMPH NODE BIOPSY;  Surgeon: Donnie Mesa, MD;  Location: [place];  Service: General;  Laterality: Right;   MELANOMA EXCISION WITH SENTINEL LYMPH NODE BIOPSY Left  10/24/2012   Procedure: MELANOMA wide EXCISION left lateral thigh WITH SENTINEL LYMPH NODE BIOPSY left groin;  Surgeon: Edward Jolly, MD;  Location: [place];  Service: General;  Laterality: Left;   NASAL SEPTUM SURGERY     PORTACATH PLACEMENT Right 12/02/2020   Procedure: INSERTION PORT-A-CATH;  Surgeon: Donnie Mesa, MD;  Location: [place];  Service: General;  Laterality: Right;   RADIOACTIVE SEED GUIDED AXILLARY SENTINEL LYMPH NODE Right 05/08/2021   Procedure: RADIOACTIVE SEED GUIDED AXILLARY SENTINEL LYMPH NODE DISSECTION;  Surgeon: Donnie Mesa, MD;  Location: [place];  Service: General;  Laterality: Right;   Patient Active Problem List   Diagnosis Date Noted   Elevated LFTs 12/10/2020   Genetic testing 12/06/2020   Family history of breast cancer 11/20/2020   Personal history of malignant melanoma 11/20/2020   Family history of melanoma 11/20/2020   Malignant neoplasm of upper-outer quadrant of right breast in female, estrogen receptor negative ([place]) 11/12/2020   Vitamin D deficiency 06/02/2019   Hyperglycemia 06/02/2019   Blood pressure elevated without history of HTN 06/02/2019   Headache 12/16/2018   Urinary incontinence, urge 12/14/2017   Calcific bursitis of shoulder 07/10/2015   Nausea alone 04/27/2013   Melanoma of thigh ([place]) 10/06/2012   Anxiety 11/17/2011   Rosacea 07/08/2011   Right shoulder pain 07/08/2011   Knee effusion, left 08/18/2010   Encounter for well adult exam with  abnormal findings 08/18/2010   Attention deficit disorder 03/13/2010   GERD 12/27/2008   Hyperlipidemia 02/23/2007   DEPRESSION 02/23/2007   ALLERGIC RHINITIS 02/23/2007   FATIGUE 02/23/2007   ANEMIA 02/05/2007   Deviated nasal septum 02/05/2007    REFERRING DIAG: right breast cancer at risk for lymphedema  THERAPY DIAG: Aftercare following surgery for neoplasm  PERTINENT HISTORY: Pt was diagnosed with 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  She had neoadjuvant chemo, and had  a right mastectomy with immediate expander on 05/08/2021. She will be having radiation.   PRECAUTIONS: right UE Lymphedema risk, None  SUBJECTIVE: Pt returns for her 3 month L-Dex screen.   PAIN:  Are you having pain? No  SOZO SCREENING: Patient was assessed today using the SOZO machine to determine the lymphedema index score. This was compared to her baseline score. It was determined that she is within the recommended range when compared to her baseline and no further action is needed at this time. She will continue SOZO screenings. These are done every 3 months for 2 years post operatively followed by every 6 months for 2 years, and then annually.   L-DEX FLOWSHEETS - 03/23/22 1500       L-DEX LYMPHEDEMA SCREENING   Measurement Type Unilateral    L-DEX MEASUREMENT EXTREMITY Upper Extremity    POSITION  Standing    DOMINANT SIDE Right    At Risk Side Right    BASELINE SCORE (UNILATERAL) 0.9    L-DEX SCORE (UNILATERAL) 2.7    VALUE CHANGE (UNILAT) 1.8              Otelia Limes, PTA 03/23/2022, 3:58 PM

## 2022-04-10 ENCOUNTER — Telehealth: Payer: Self-pay | Admitting: *Deleted

## 2022-04-10 NOTE — Telephone Encounter (Signed)
Call to [employer] with rep Lavena Stanford , fax received 11/14, determined no auth required but never sent to [place]. Fax back confirming this information requested today.

## 2022-04-20 NOTE — Progress Notes (Unsigned)
BRIEF ONCOLOGIC HISTORY:  Oncology History  Malignant neoplasm of upper-outer quadrant of right breast in female, estrogen receptor negative ([place])  11/12/2020 Initial Diagnosis   Malignant neoplasm of upper-outer quadrant of right breast in female, estrogen receptor positive ([place])   11/20/2020 Cancer Staging   Staging form: Breast, AJCC 8th Edition - Clinical: Stage IIB (cT1c, cN1, cM0, G3, ER-, PR-, HER2-) - Signed by [name], MD on 11/20/2020 Histologic grading system: 3 grade system   12/03/2020 - 04/10/2021 Chemotherapy   Patient is on Treatment Plan : BREAST Pembrolizumab + Carboplatin D1 + Paclitaxel D1,8,15 q21d X 4 cycles / Pembrolizumab + AC q21d x 4 cycles     12/03/2020 Genetic Testing   Negative hereditary cancer genetic testing: no pathogenic variants detected in [facility] CancerNext-Expanded +RNAinsight Panel.  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  The report date is December 03, 2020.   The CancerNext-Expanded gene panel offered by [facility] and includes sequencing, rearrangement, and RNA analysis for the following 77 genes: AIP, ALK, APC, ATM, AXIN2, BAP1, BARD1, BLM, BMPR1A, BRCA1, BRCA2, BRIP1, CDC73, CDH1, CDK4, CDKN1B, CDKN2A, CHEK2, CTNNA1, DICER1, FANCC, FH, FLCN, GALNT12, KIF1B, LZTR1, MAX, MEN1, MET, MLH1, MSH2, MSH3, MSH6, MUTYH, NBN, NF1, NF2, NTHL1, PALB2, PHOX2B, PMS2, POT1, PRKAR1A, PTCH1, PTEN, RAD51C, RAD51D, RB1, RECQL, RET, SDHA, SDHAF2, SDHB, SDHC, SDHD, SMAD4, SMARCA4, SMARCB1, SMARCE1, STK11, SUFU, TMEM127, TP53, TSC1, TSC2, VHL and XRCC2 (sequencing and deletion/duplication); EGFR, EGLN1, HOXB13, KIT, MITF, PDGFRA, POLD1, and POLE (sequencing only); EPCAM and GREM1 (deletion/duplication only).    04/29/2021 - 11/04/2021 Chemotherapy   Patient is on Treatment Plan : BREAST Pembrolizumab (200) q21d x 27 weeks     05/08/2021 Cancer Staging   Staging form: Breast, AJCC 8th Edition - Pathologic stage from 05/08/2021:  No Stage Recommended (ypT2, pN1a, cM0, G3, ER-, PR-, HER2-) - Signed by [name], NP on 05/21/2021 Stage prefix: Post-therapy Histologic grading system: 3 grade system   05/08/2021 Surgery   She had right mastectomy and targeted right axillary dissection on 05/08/2021.  Pathology showed 2.3 x 1.7 x 1.0 cm invasive ductal carcinoma, grade 3 of 3, treatment effect was robust in the breast and minimal in lymph nodes.  All surgical margins negative for invasive carcinoma, out of 4 examined the lymph nodes, 2 with micrometastasis and 0 with micrometastasis or isolated tumor cells.  Largest metastatic deposit in the lymph node was 4 mm and extranodal extension was present.   06/23/2021 - 07/31/2021 Radiation Therapy   06/23/2021 through 07/31/2021 Site Technique Total Dose (Gy) Dose per Fx (Gy) Completed Fx Beam Energies  Chest Wall, Right: CW_R_IMN 3D 50.4/50.4 1.8 28/28 6X  Chest Wall, Right: CW_R_PAB_SCV 3D 50.4/50.4 1.8 28/28 6X, 10X      INTERVAL HISTORY:   Robin Farley is here for follow up. Since last visit, she had CT for back pain. This showed no evidence of lesions. She today reports improvement in back pain, has noted some shoulder pain on the right side for the past 6-8 weeks. She rates it as mild, 3/4-10. No change in breathing.  No change in bowel habits. No change in urinary habits. No new neurological habits.  REVIEW OF SYSTEMS:  Review of Systems  Constitutional:  Negative for appetite change, chills, fatigue, fever and unexpected weight change.  HENT:   Negative for hearing loss, lump/mass and trouble swallowing.   Eyes:  Negative for eye problems and icterus.  Respiratory:  Negative for chest tightness, cough  and shortness of breath.   Cardiovascular:  Negative for chest pain, leg swelling and palpitations.  Gastrointestinal:  Negative for abdominal distention, abdominal pain, constipation, diarrhea, nausea and vomiting.  Endocrine: Negative for hot flashes.   Genitourinary:  Negative for difficulty urinating.   Musculoskeletal:  Positive for back pain. Negative for arthralgias.  Skin:  Negative for itching and rash.  Neurological:  Negative for dizziness, extremity weakness, headaches and numbness.  Hematological:  Negative for adenopathy. Does not bruise/bleed easily.  Psychiatric/Behavioral:  Negative for depression. The patient is not nervous/anxious.   Breast: Denies any new nodularity, masses, tenderness, nipple changes, or nipple discharge.      ONCOLOGY TREATMENT TEAM:  1. Surgeon:  Dr. Georgette Dover at [facility] 2. Medical Oncologist: Dr. Chryl Heck  3. Radiation Oncologist: Dr. Isidore Moos    PAST MEDICAL/SURGICAL HISTORY:  Past Medical History:  Diagnosis Date   Abdominal pain, generalized 12/27/2008   Acute sinusitis, unspecified 02/26/2007   ADD 03/13/2010   ALLERGIC RHINITIS 02/23/2007   ANEMIA 02/05/2007   Anxiety 11/17/2011   ATTENTION DEFICIT DISORDER, HX OF 02/05/2007   Breast cancer ([place])    Deviated nasal septum 02/05/2007   Diabetes mellitus without complication ([place])    Family history of breast cancer 11/20/2020   Family history of melanoma 11/20/2020   FATIGUE 02/23/2007   HYPERLIPIDEMIA 02/23/2007   Personal history of malignant melanoma 11/20/2020   Port-A-Cath in place 12/03/2020   SWELLING MASS OR LUMP IN HEAD AND NECK 07/27/2008   UTI 02/23/2007   Past Surgical History:  Procedure Laterality Date   BREAST RECONSTRUCTION WITH PLACEMENT OF TISSUE EXPANDER AND FLEX HD (ACELLULAR HYDRATED DERMIS) Right 05/08/2021   Procedure: BREAST RECONSTRUCTION WITH PLACEMENT OF TISSUE EXPANDER AND FLEX HD (ACELLULAR HYDRATED DERMIS);  Surgeon: Cindra Presume, MD;  Location: [place];  Service: Plastics;  Laterality: Right;   BROW LIFT     CESAREAN SECTION     COSMETIC SURGERY  1995   Nasal   MASTECTOMY W/ SENTINEL NODE BIOPSY Right 05/08/2021   Procedure: RIGHT MASTECTOMY WITH SENTINEL LYMPH NODE  BIOPSY;  Surgeon: Donnie Mesa, MD;  Location: [place];  Service: General;  Laterality: Right;   MELANOMA EXCISION WITH SENTINEL LYMPH NODE BIOPSY Left 10/24/2012   Procedure: MELANOMA wide EXCISION left lateral thigh WITH SENTINEL LYMPH NODE BIOPSY left groin;  Surgeon: Edward Jolly, MD;  Location: [place];  Service: General;  Laterality: Left;   NASAL SEPTUM SURGERY     PORTACATH PLACEMENT Right 12/02/2020   Procedure: INSERTION PORT-A-CATH;  Surgeon: Donnie Mesa, MD;  Location: [place];  Service: General;  Laterality: Right;   RADIOACTIVE SEED GUIDED AXILLARY SENTINEL LYMPH NODE Right 05/08/2021   Procedure: RADIOACTIVE SEED GUIDED AXILLARY SENTINEL LYMPH NODE DISSECTION;  Surgeon: Donnie Mesa, MD;  Location: [place];  Service: General;  Laterality: Right;     ALLERGIES:  Allergies  Allergen Reactions   Promethazine Hcl     Legs twitch     CURRENT MEDICATIONS:  Outpatient Encounter Medications as of 04/21/2022  Medication Sig   amphetamine-dextroamphetamine (ADDERALL) 20 MG tablet Take 1.5 tabs by mouth twice per day   atorvastatin (LIPITOR) 20 MG tablet Take 1 tablet (20 mg total) by mouth daily.   metFORMIN (GLUCOPHAGE-XR) 500 MG 24 hr tablet Take 1 tablet (500 mg total) by mouth daily with breakfast.   No facility-administered encounter medications on file as of 04/21/2022.     ONCOLOGIC  FAMILY HISTORY:  Family History  Problem Relation Age of Onset   Hypertension Mother    Breast cancer Mother 40   COPD Father    Atrial fibrillation Father    Atrial fibrillation Sister    Kidney cancer Maternal Aunt        dx 74s   Leukemia Paternal Aunt        dx after 32   Cancer Paternal Aunt        unknown type; dx after 26   Brain cancer Paternal Grandfather 73       brain tumor per pt   Melanoma Cousin        arm; no mets per pt   Alcohol abuse Other    Depression Other    Glaucoma Other     Breast cancer Other        MGM's sisters, x3, dx after 47   Colon polyps Neg Hx      SOCIAL HISTORY:  Social History   Socioeconomic History   Marital status: Married    Spouse name: Not on file   Number of children: 2   Years of education: Not on file   Highest education level: Not on file  Occupational History   Occupation: Brewing technologist in fine arts [employer]    Employer: [employer]  Tobacco Use   Smoking status: Never   Smokeless tobacco: Never  Vaping Use   Vaping Use: Never used  Substance and Sexual Activity   Alcohol use: Not Currently    Comment: Very rare occasions   Drug use: Never   Sexual activity: Yes    Birth control/protection: None, Post-menopausal  Other Topics Concern   Not on file  Social History Narrative   Not on file   Social Determinants of Health   Financial Resource Strain: Low Risk  (11/20/2020)   Overall Financial Resource Strain (CARDIA)    Difficulty of Paying Living Expenses: Not hard at all  Food Insecurity: No Food Insecurity (11/20/2020)   Hunger Vital Sign    Worried About Running Out of Food in the Last Year: Never true    [place] in the Last Year: Never true  Transportation Needs: No Transportation Needs (11/20/2020)   PRAPARE - Hydrologist (Medical): No    Lack of Transportation (Non-Medical): No  Physical Activity: Not on file  Stress: Not on file  Social Connections: Not on file  Intimate Partner Violence: Not on file     OBSERVATIONS/OBJECTIVE:  LMP 04/27/2016   GENERAL: Patient is a well appearing female in no acute distress  NODES:  No cervical, supraclavicular, or axillary lymphadenopathy palpated.  BREAST EXAM: Status post right breast mastectomy. Expander in place. No palpable concerns. No regional adenopathy LUNGS:  Clear to auscultation bilaterally.  No wheezes or rhonchi. HEART:  Regular rate and rhythm. No murmur appreciated. ABDOMEN:  Soft, nontender.  Positive,  normoactive bowel sounds. No organomegaly palpated. EXTREMITIES:  No peripheral edema.   SKIN:  Clear with no obvious rashes or skin changes. No nail dyscrasia. NEURO:  Nonfocal. Well oriented.  Appropriate affect.   LABORATORY DATA:  None for this visit.  DIAGNOSTIC IMAGING:  None for this visit.      ASSESSMENT AND PLAN:   ASSESSMENT: 57 y.o. [place] woman with T1c N1, stage IIB invasive ductal carcinoma, grade 3, functionally triple negative, with an MIB-1 of 30%              (  1) genetics test 12/03/2020 through the [facility] +RNAinsight Panel found no deleterious mutations in AIP, ALK, APC, ATM, AXIN2, BAP1, BARD1, BLM, BMPR1A, BRCA1, BRCA2, BRIP1, CDC73, CDH1, CDK4, CDKN1B, CDKN2A, CHEK2, CTNNA1, DICER1, FANCC, FH, FLCN, GALNT12, KIF1B, LZTR1, MAX, MEN1, MET, MLH1, MSH2, MSH3, MSH6, MUTYH, NBN, NF1, NF2, NTHL1, PALB2, PHOX2B, PMS2, POT1, PRKAR1A, PTCH1, PTEN, RAD51C, RAD51D, RB1, RECQL, RET, SDHA, SDHAF2, SDHB, SDHC, SDHD, SMAD4, SMARCA4, SMARCB1, SMARCE1, STK11, SUFU, TMEM127, TP53, TSC1, TSC2, VHL and XRCC2 (sequencing and deletion/duplication); EGFR, EGLN1, HOXB13, KIT, MITF, PDGFRA, POLD1, and POLE (sequencing only); EPCAM and GREM1 (deletion/duplication only).     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   She underwent right mastectomy and targeted lymph node dissection, final pathology showing tumor measuring 2.3 cm in largest dimension, grade 3, negative margins with excellent response in the breast however minimal response in the  lymph node, 2 out of 4 lymph nodes positive for involvement, extranodal extension present, largest metastasis is 4 mm.  PLAN  There is no concern for recurrence on exam. She is due for left breast mammogram, this has been ordered. At this time, back pain has resolved, CT imaging also neg. She has some mild right shoulder pain, but she wants to continue watching it for now. She was clearly instructed to let us know if this worsens. She was also instructed to call plastic surgery for reconstruction updates. RTC in 6 months or sooner as needed. Labs today.   Total time spent: 30 min  *Total Encounter Time as defined by the Centers for Medicare and Medicaid Services includes, in addition to the face-to-face time of a patient visit (documented in the note above) non-face-to-face time: obtaining and reviewing outside history, ordering and reviewing medications, tests or procedures, care coordination (communications with other health care professionals or caregivers) and documentation in the medical record.

## 2022-04-21 ENCOUNTER — Encounter: Payer: Self-pay | Admitting: Hematology and Oncology

## 2022-04-21 ENCOUNTER — Inpatient Hospital Stay: Payer: Managed Care, Other (non HMO)

## 2022-04-21 ENCOUNTER — Telehealth: Payer: Self-pay | Admitting: *Deleted

## 2022-04-21 ENCOUNTER — Inpatient Hospital Stay: Payer: Managed Care, Other (non HMO) | Attending: Hematology and Oncology | Admitting: Hematology and Oncology

## 2022-04-21 VITALS — BP 142/68 | HR 72 | Temp 98.8°F | Resp 16 | Ht 66.0 in | Wt 168.8 lb

## 2022-04-21 DIAGNOSIS — Z7984 Long term (current) use of oral hypoglycemic drugs: Secondary | ICD-10-CM | POA: Insufficient documentation

## 2022-04-21 DIAGNOSIS — C773 Secondary and unspecified malignant neoplasm of axilla and upper limb lymph nodes: Secondary | ICD-10-CM | POA: Diagnosis present

## 2022-04-21 DIAGNOSIS — Z171 Estrogen receptor negative status [ER-]: Secondary | ICD-10-CM | POA: Insufficient documentation

## 2022-04-21 DIAGNOSIS — M25511 Pain in right shoulder: Secondary | ICD-10-CM | POA: Insufficient documentation

## 2022-04-21 DIAGNOSIS — C50411 Malignant neoplasm of upper-outer quadrant of right female breast: Secondary | ICD-10-CM | POA: Diagnosis not present

## 2022-04-21 DIAGNOSIS — R7989 Other specified abnormal findings of blood chemistry: Secondary | ICD-10-CM

## 2022-04-21 DIAGNOSIS — Z923 Personal history of irradiation: Secondary | ICD-10-CM | POA: Insufficient documentation

## 2022-04-21 DIAGNOSIS — Z79899 Other long term (current) drug therapy: Secondary | ICD-10-CM | POA: Diagnosis not present

## 2022-04-21 DIAGNOSIS — Z9011 Acquired absence of right breast and nipple: Secondary | ICD-10-CM | POA: Diagnosis not present

## 2022-04-21 LAB — CBC WITH DIFFERENTIAL/PLATELET
Abs Immature Granulocytes: 0.01 10*3/uL (ref 0.00–0.07)
Basophils Absolute: 0 10*3/uL (ref 0.0–0.1)
Basophils Relative: 1 %
Eosinophils Absolute: 0.1 10*3/uL (ref 0.0–0.5)
Eosinophils Relative: 4 %
HCT: 33.6 % — ABNORMAL LOW (ref 36.0–46.0)
Hemoglobin: 12 g/dL (ref 12.0–15.0)
Immature Granulocytes: 0 %
Lymphocytes Relative: 22 %
Lymphs Abs: 0.8 10*3/uL (ref 0.7–4.0)
MCH: 33 pg (ref 26.0–34.0)
MCHC: 35.7 g/dL (ref 30.0–36.0)
MCV: 92.3 fL (ref 80.0–100.0)
Monocytes Absolute: 0.3 10*3/uL (ref 0.1–1.0)
Monocytes Relative: 10 %
Neutro Abs: 2.2 10*3/uL (ref 1.7–7.7)
Neutrophils Relative %: 63 %
Platelets: 207 10*3/uL (ref 150–400)
RBC: 3.64 MIL/uL — ABNORMAL LOW (ref 3.87–5.11)
RDW: 12 % (ref 11.5–15.5)
WBC: 3.5 10*3/uL — ABNORMAL LOW (ref 4.0–10.5)
nRBC: 0 % (ref 0.0–0.2)

## 2022-04-21 LAB — COMPREHENSIVE METABOLIC PANEL
ALT: 12 U/L (ref 0–44)
AST: 13 U/L — ABNORMAL LOW (ref 15–41)
Albumin: 3.9 g/dL (ref 3.5–5.0)
Alkaline Phosphatase: 84 U/L (ref 38–126)
Anion gap: 7 (ref 5–15)
BUN: 18 mg/dL (ref 6–20)
CO2: 25 mmol/L (ref 22–32)
Calcium: 9.3 mg/dL (ref 8.9–10.3)
Chloride: 109 mmol/L (ref 98–111)
Creatinine, Ser: 0.86 mg/dL (ref 0.44–1.00)
GFR, Estimated: >60 mL/min (ref >60)
Glucose, Bld: 89 mg/dL (ref 70–99)
Potassium: 4.1 mmol/L (ref 3.5–5.1)
Sodium: 141 mmol/L (ref 135–145)
Total Bilirubin: 0.3 mg/dL (ref 0.3–1.2)
Total Protein: 6.7 g/dL (ref 6.5–8.1)

## 2022-04-21 LAB — TSH: TSH: 3.531 u[IU]/mL (ref 0.350–4.500)

## 2022-04-21 NOTE — Telephone Encounter (Signed)
LVM to schedule surgery

## 2022-04-22 ENCOUNTER — Telehealth: Payer: Self-pay | Admitting: *Deleted

## 2022-04-22 LAB — T4: T4, Total: 8.3 ug/dL (ref 4.5–12.0)

## 2022-04-22 NOTE — Telephone Encounter (Signed)
LVM to schedule surgery

## 2022-04-24 ENCOUNTER — Encounter: Payer: Self-pay | Admitting: Physician Assistant

## 2022-04-24 ENCOUNTER — Ambulatory Visit (INDEPENDENT_AMBULATORY_CARE_PROVIDER_SITE_OTHER): Payer: Managed Care, Other (non HMO) | Admitting: Physician Assistant

## 2022-04-24 VITALS — BP 128/82 | HR 79 | Ht 66.0 in | Wt 168.4 lb

## 2022-04-24 DIAGNOSIS — Z9889 Other specified postprocedural states: Secondary | ICD-10-CM

## 2022-04-24 MED ORDER — OXYCODONE-ACETAMINOPHEN 5-325 MG PO TABS: 1.0000 | ORAL_TABLET | Freq: Four times a day (QID) | ORAL | 0 refills | Status: AC | PRN

## 2022-04-24 NOTE — Progress Notes (Signed)
Patient ID: Robin Farley, female    DOB: 1965/06/12, 57 y.o.   MRN: [id number]  Chief Complaint  Patient presents with   Pre-op Exam    No diagnosis found.   History of Present Illness: Robin Farley is a 57 y.o.  female  with a history of right-sided breast reconstruction on 05/08/2021 by Dr. Claudia Desanctis.  She presents for preoperative evaluation for upcoming procedure, removal of tissue expander with placement of implant, scheduled for 05/05/2022 with Dr. Lovena Le.  The patient has not had problems with anesthesia.   Summary of Previous Visit: The patient was last seen in the office on 02/09/2022 by Dr. Lovena Le.  At that time she noted she had undergone subsequent radiation therapy after tissue expander placement and has finished radiation in April of this year.  Dr. Lovena Le discussed that the next phase of the treatment would be for placement of the implant on the right side, he would like to delay the mastopexy on the left until the right side has healed.  Upon her initial presentation in January 2023 she noted that she was a C cup but would like to be a D cup if possible.  Patient denies any significant issues related to anesthesia, she has no allergies, she is not on any anticoagulants or antiplatelets.  Other than her history of cancer she has no other identifiable high risk factors for DVT or PE.   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  She is no longer taking metformin or the Adderall   Past Medical History: Allergies: Allergies  Allergen Reactions   Promethazine Hcl     Legs twitch    Current Medications:  Current Outpatient Medications:    amphetamine-dextroamphetamine (ADDERALL) 20 MG tablet, Take 1.5 tabs by mouth twice per day, Disp: 90 tablet, Rfl: 0   atorvastatin (LIPITOR) 20 MG tablet, Take 1 tablet (20 mg  total) by mouth daily., Disp: 90 tablet, Rfl: 3   metFORMIN (GLUCOPHAGE-XR) 500 MG 24 hr tablet, Take 1 tablet (500 mg total) by mouth daily with breakfast., Disp: 90 tablet, Rfl: 3  Past Medical Problems: Past Medical History:  Diagnosis Date   Abdominal pain, generalized 12/27/2008   Acute sinusitis, unspecified 02/26/2007   ADD 03/13/2010   ALLERGIC RHINITIS 02/23/2007   ANEMIA 02/05/2007   Anxiety 11/17/2011   ATTENTION DEFICIT DISORDER, HX OF 02/05/2007   Breast cancer ([place])    Deviated nasal septum 02/05/2007   Diabetes mellitus without complication ([place])    Family history of breast cancer 11/20/2020   Family history of melanoma 11/20/2020   FATIGUE 02/23/2007   HYPERLIPIDEMIA 02/23/2007   Personal history of malignant melanoma 11/20/2020   Port-A-Cath in place 12/03/2020   SWELLING MASS OR LUMP IN HEAD AND NECK 07/27/2008   UTI 02/23/2007    Past Surgical History: Past Surgical History:  Procedure Laterality Date   BREAST RECONSTRUCTION WITH PLACEMENT OF TISSUE EXPANDER AND FLEX HD (ACELLULAR HYDRATED DERMIS) Right 05/08/2021   Procedure: BREAST RECONSTRUCTION WITH PLACEMENT OF TISSUE EXPANDER AND FLEX HD (ACELLULAR HYDRATED DERMIS);  Surgeon: Cindra Presume, MD;  Location: [facility];  Service: Plastics;  Laterality: Right;   BROW LIFT     CESAREAN SECTION     COSMETIC SURGERY  1995   Nasal   MASTECTOMY W/ SENTINEL NODE BIOPSY Right 05/08/2021   Procedure:  RIGHT MASTECTOMY WITH SENTINEL LYMPH NODE BIOPSY;  Surgeon: Donnie Mesa, MD;  Location: [facility];  Service: General;  Laterality: Right;   MELANOMA EXCISION WITH SENTINEL LYMPH NODE BIOPSY Left 10/24/2012   Procedure: MELANOMA wide EXCISION left lateral thigh WITH SENTINEL LYMPH NODE BIOPSY left groin;  Surgeon: Edward Jolly, MD;  Location: [facility];  Service: General;  Laterality: Left;   NASAL SEPTUM SURGERY     PORTACATH PLACEMENT Right 12/02/2020    Procedure: INSERTION PORT-A-CATH;  Surgeon: Donnie Mesa, MD;  Location: [facility];  Service: General;  Laterality: Right;   RADIOACTIVE SEED GUIDED AXILLARY SENTINEL LYMPH NODE Right 05/08/2021   Procedure: RADIOACTIVE SEED GUIDED AXILLARY SENTINEL LYMPH NODE DISSECTION;  Surgeon: Donnie Mesa, MD;  Location: [facility];  Service: General;  Laterality: Right;    Social History: Social History   Socioeconomic History   Marital status: Married    Spouse name: Not on file   Number of children: 2   Years of education: Not on file   Highest education level: Not on file  Occupational History   Occupation: master's in fine arts [employer]    Employer: [employer]  Tobacco Use   Smoking status: Never   Smokeless tobacco: Never  Vaping Use   Vaping Use: Never used  Substance and Sexual Activity   Alcohol use: Not Currently    Comment: Very rare occasions   Drug use: Never   Sexual activity: Yes    Birth control/protection: None, Post-menopausal  Other Topics Concern   Not on file  Social History Narrative   Not on file   Social Determinants of Health   Financial Resource Strain: Low Risk  (11/20/2020)   Overall Financial Resource Strain (CARDIA)    Difficulty of Paying Living Expenses: Not hard at all  Food Insecurity: No Food Insecurity (11/20/2020)   Hunger Vital Sign    Worried About Running Out of Food in the Last Year: Never true    [place] in the Last Year: Never true  Transportation Needs: No Transportation Needs (11/20/2020)   PRAPARE - Hydrologist (Medical): No    Lack of Transportation (Non-Medical): No  Physical Activity: Not on file  Stress: Not on file  Social Connections: Not on file  Intimate Partner Violence: Not on file    Family History: Family History  Problem Relation Age of Onset   Hypertension Mother    Breast cancer Mother 89   COPD Father    Atrial fibrillation Father     Atrial fibrillation Sister    Kidney cancer Maternal Aunt        dx 90s   Leukemia Paternal Aunt        dx after 19   Cancer Paternal Aunt        unknown type; dx after 17   Brain cancer Paternal Grandfather 31       brain tumor per pt   Melanoma Cousin        arm; no mets per pt   Alcohol abuse Other    Depression Other    Glaucoma Other    Breast cancer Other        MGM's sisters, x3, dx after 31   Colon polyps Neg Hx     Review of Systems: ROS  Physical Exam: Vital Signs BP 128/82 (BP Location: Left Arm, Patient Position: Sitting, Cuff Size: Normal)   Pulse 79  Ht '5\' 6"'$  (1.676 m)   Wt 168 lb 6.9 oz (76.4 kg)   LMP 04/27/2016   SpO2 97%   BMI 27.19 kg/m   Physical Exam  Constitutional:      General: Not in acute distress.    Appearance: Normal appearance. Not ill-appearing.  HENT:     Head: Normocephalic and atraumatic.  Eyes:     Pupils: Pupils are equal, round. Cardiovascular:     Rate and Rhythm: Normal rate. Pulmonary:     Effort: No respiratory distress or increased work of breathing.  Speaks in full sentences. Musculoskeletal: Normal range of motion. No lower extremity swelling or edema. No varicosities.  Skin:    General: Skin is warm and dry.     Findings: No erythema or rash.  Neurological:     Mental Status: Alert and oriented to person, place, and time.  Psychiatric:        Mood and Affect: Mood normal.        Behavior: Behavior normal.    Assessment/Plan: The patient is scheduled for removal of tissue expander with placement of implant with Dr. Lovena Le.  Risks, benefits, and alternatives of procedure discussed, questions answered and consent obtained.    Smoking Status: Non-smoker Last Mammogram: 04/11/2021; Results: BI-RADS Category 4: Suspicious, biopsy-proven known right breast malignancy.   Caprini Score: 7; previous history of malignancy, age, length of surgery-recommend early ambulation  Pictures obtained: Previous  visit  Post-op Rx sent to pharmacy: percocet  Patient was provided with the General Surgical Risk consent document and Pain Medication Agreement prior to their appointment.  They had adequate time to read through the risk consent documents and Pain Medication Agreement. We also discussed them in person together during this preop appointment. All of their questions were answered to their satisfaction.  Recommended calling if they have any further questions.  Risk consent form and Pain Medication Agreement to be scanned into patient's chart.    Electronically signed by: Stevie Kern Robin Bonafede, PA-C 04/24/2022 2:51 PM

## 2022-04-24 NOTE — H&P (View-Only) (Signed)
Patient ID: Robin Farley, female    DOB: [birth year], 57 y.o.   MRN: [id number]  Chief Complaint  Patient presents with   Pre-op Exam    No diagnosis found.   History of Present Illness: Robin Farley is a 57 y.o.  female  with a history of right-sided breast reconstruction on 05/08/2021 by Dr. Claudia Desanctis.  She presents for preoperative evaluation for upcoming procedure, removal of tissue expander with placement of implant, scheduled for 05/05/2022 with Dr. Lovena Le.  The patient has not had problems with anesthesia.   Summary of Previous Visit: The patient was last seen in the office on 02/09/2022 by Dr. Lovena Le.  At that time she noted she had undergone subsequent radiation therapy after tissue expander placement and has finished radiation in April of this year.  Dr. Lovena Le discussed that the next phase of the treatment would be for placement of the implant on the right side, he would like to delay the mastopexy on the left until the right side has healed.  Upon her initial presentation in January 2023 she noted that she was a C cup but would like to be a D cup if possible.  Patient denies any significant issues related to anesthesia, she has no allergies, she is not on any anticoagulants or antiplatelets.  Other than her history of cancer she has no other identifiable high risk factors for DVT or PE.   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  She is no longer taking metformin or the Adderall   Past Medical History: Allergies: Allergies  Allergen Reactions   Promethazine Hcl     Legs twitch    Current Medications:  Current Outpatient Medications:    amphetamine-dextroamphetamine (ADDERALL) 20 MG tablet, Take 1.5 tabs by mouth twice per day, Disp: 90 tablet, Rfl: 0   atorvastatin (LIPITOR) 20 MG tablet, Take 1 tablet (20 mg  total) by mouth daily., Disp: 90 tablet, Rfl: 3   metFORMIN (GLUCOPHAGE-XR) 500 MG 24 hr tablet, Take 1 tablet (500 mg total) by mouth daily with breakfast., Disp: 90 tablet, Rfl: 3  Past Medical Problems: Past Medical History:  Diagnosis Date   Abdominal pain, generalized 12/27/2008   Acute sinusitis, unspecified 02/26/2007   ADD 03/13/2010   ALLERGIC RHINITIS 02/23/2007   ANEMIA 02/05/2007   Anxiety 11/17/2011   ATTENTION DEFICIT DISORDER, HX OF 02/05/2007   Breast cancer ([place])    Deviated nasal septum 02/05/2007   Diabetes mellitus without complication ([place])    Family history of breast cancer 11/20/2020   Family history of melanoma 11/20/2020   FATIGUE 02/23/2007   HYPERLIPIDEMIA 02/23/2007   Personal history of malignant melanoma 11/20/2020   Port-A-Cath in place 12/03/2020   SWELLING MASS OR LUMP IN HEAD AND NECK 07/27/2008   UTI 02/23/2007    Past Surgical History: Past Surgical History:  Procedure Laterality Date   BREAST RECONSTRUCTION WITH PLACEMENT OF TISSUE EXPANDER AND FLEX HD (ACELLULAR HYDRATED DERMIS) Right 05/08/2021   Procedure: BREAST RECONSTRUCTION WITH PLACEMENT OF TISSUE EXPANDER AND FLEX HD (ACELLULAR HYDRATED DERMIS);  Surgeon: Cindra Presume, MD;  Location: [facility];  Service: Plastics;  Laterality: Right;   BROW LIFT     CESAREAN SECTION     COSMETIC SURGERY  1995   Nasal   MASTECTOMY W/ SENTINEL NODE BIOPSY Right 05/08/2021   Procedure:  RIGHT MASTECTOMY WITH SENTINEL LYMPH NODE BIOPSY;  Surgeon: Donnie Mesa, MD;  Location: [facility];  Service: General;  Laterality: Right;   MELANOMA EXCISION WITH SENTINEL LYMPH NODE BIOPSY Left 10/24/2012   Procedure: MELANOMA wide EXCISION left lateral thigh WITH SENTINEL LYMPH NODE BIOPSY left groin;  Surgeon: Edward Jolly, MD;  Location: [facility];  Service: General;  Laterality: Left;   NASAL SEPTUM SURGERY     PORTACATH PLACEMENT Right 12/02/2020    Procedure: INSERTION PORT-A-CATH;  Surgeon: Donnie Mesa, MD;  Location: [facility];  Service: General;  Laterality: Right;   RADIOACTIVE SEED GUIDED AXILLARY SENTINEL LYMPH NODE Right 05/08/2021   Procedure: RADIOACTIVE SEED GUIDED AXILLARY SENTINEL LYMPH NODE DISSECTION;  Surgeon: Donnie Mesa, MD;  Location: [facility];  Service: General;  Laterality: Right;    Social History: Social History   Socioeconomic History   Marital status: Married    Spouse name: Not on file   Number of children: 2   Years of education: Not on file   Highest education level: Not on file  Occupational History   Occupation: master's in fine arts [employer]    Employer: [employer]  Tobacco Use   Smoking status: Never   Smokeless tobacco: Never  Vaping Use   Vaping Use: Never used  Substance and Sexual Activity   Alcohol use: Not Currently    Comment: Very rare occasions   Drug use: Never   Sexual activity: Yes    Birth control/protection: None, Post-menopausal  Other Topics Concern   Not on file  Social History Narrative   Not on file   Social Determinants of Health   Financial Resource Strain: Low Risk  (11/20/2020)   Overall Financial Resource Strain (CARDIA)    Difficulty of Paying Living Expenses: Not hard at all  Food Insecurity: No Food Insecurity (11/20/2020)   Hunger Vital Sign    Worried About Running Out of Food in the Last Year: Never true    [place] in the Last Year: Never true  Transportation Needs: No Transportation Needs (11/20/2020)   PRAPARE - Hydrologist (Medical): No    Lack of Transportation (Non-Medical): No  Physical Activity: Not on file  Stress: Not on file  Social Connections: Not on file  Intimate Partner Violence: Not on file    Family History: Family History  Problem Relation Age of Onset   Hypertension Mother    Breast cancer Mother 77   COPD Father    Atrial fibrillation Father     Atrial fibrillation Sister    Kidney cancer Maternal Aunt        dx 67s   Leukemia Paternal Aunt        dx after 39   Cancer Paternal Aunt        unknown type; dx after 74   Brain cancer Paternal Grandfather 68       brain tumor per pt   Melanoma Cousin        arm; no mets per pt   Alcohol abuse Other    Depression Other    Glaucoma Other    Breast cancer Other        MGM's sisters, x3, dx after 92   Colon polyps Neg Hx     Review of Systems: ROS  Physical Exam: Vital Signs BP 128/82 (BP Location: Left Arm, Patient Position: Sitting, Cuff Size: Normal)   Pulse 79  Ht '5\' 6"'$  (1.676 m)   Wt 168 lb 6.9 oz (76.4 kg)   LMP 04/27/2016   SpO2 97%   BMI 27.19 kg/m   Physical Exam  Constitutional:      General: Not in acute distress.    Appearance: Normal appearance. Not ill-appearing.  HENT:     Head: Normocephalic and atraumatic.  Eyes:     Pupils: Pupils are equal, round. Cardiovascular:     Rate and Rhythm: Normal rate. Pulmonary:     Effort: No respiratory distress or increased work of breathing.  Speaks in full sentences. Musculoskeletal: Normal range of motion. No lower extremity swelling or edema. No varicosities.  Skin:    General: Skin is warm and dry.     Findings: No erythema or rash.  Neurological:     Mental Status: Alert and oriented to person, place, and time.  Psychiatric:        Mood and Affect: Mood normal.        Behavior: Behavior normal.    Assessment/Plan: The patient is scheduled for removal of tissue expander with placement of implant with Dr. Lovena Le.  Risks, benefits, and alternatives of procedure discussed, questions answered and consent obtained.    Smoking Status: Non-smoker Last Mammogram: 04/11/2021; Results: BI-RADS Category 4: Suspicious, biopsy-proven known right breast malignancy.   Caprini Score: 7; previous history of malignancy, age, length of surgery-recommend early ambulation  Pictures obtained: Previous  visit  Post-op Rx sent to pharmacy: percocet  Patient was provided with the General Surgical Risk consent document and Pain Medication Agreement prior to their appointment.  They had adequate time to read through the risk consent documents and Pain Medication Agreement. We also discussed them in person together during this preop appointment. All of their questions were answered to their satisfaction.  Recommended calling if they have any further questions.  Risk consent form and Pain Medication Agreement to be scanned into patient's chart.    Electronically signed by: Stevie Kern Lalani Winkles, PA-C 04/24/2022 2:51 PM

## 2022-04-30 ENCOUNTER — Other Ambulatory Visit: Payer: Self-pay

## 2022-04-30 ENCOUNTER — Encounter (HOSPITAL_BASED_OUTPATIENT_CLINIC_OR_DEPARTMENT_OTHER): Payer: Self-pay | Admitting: Plastic Surgery

## 2022-05-04 ENCOUNTER — Encounter (HOSPITAL_BASED_OUTPATIENT_CLINIC_OR_DEPARTMENT_OTHER)
Admission: RE | Admit: 2022-05-04 | Discharge: 2022-05-04 | Disposition: A | Discharge status: Home / Self Care | Payer: Managed Care, Other (non HMO) | Source: Ambulatory Visit | Attending: Plastic Surgery | Admitting: Plastic Surgery

## 2022-05-04 DIAGNOSIS — Z9011 Acquired absence of right breast and nipple: Secondary | ICD-10-CM | POA: Diagnosis not present

## 2022-05-04 DIAGNOSIS — Z853 Personal history of malignant neoplasm of breast: Secondary | ICD-10-CM | POA: Diagnosis not present

## 2022-05-04 DIAGNOSIS — Z45811 Encounter for adjustment or removal of right breast implant: Secondary | ICD-10-CM | POA: Diagnosis not present

## 2022-05-04 DIAGNOSIS — E119 Type 2 diabetes mellitus without complications: Secondary | ICD-10-CM | POA: Diagnosis not present

## 2022-05-04 NOTE — Progress Notes (Signed)

## 2022-05-05 ENCOUNTER — Encounter (HOSPITAL_BASED_OUTPATIENT_CLINIC_OR_DEPARTMENT_OTHER)
Admission: RE | Disposition: A | Discharge status: Home / Self Care | Payer: Self-pay | Source: Home / Self Care | Attending: Plastic Surgery

## 2022-05-05 ENCOUNTER — Encounter (HOSPITAL_BASED_OUTPATIENT_CLINIC_OR_DEPARTMENT_OTHER): Payer: Self-pay | Admitting: Plastic Surgery

## 2022-05-05 ENCOUNTER — Ambulatory Visit (HOSPITAL_BASED_OUTPATIENT_CLINIC_OR_DEPARTMENT_OTHER): Payer: Managed Care, Other (non HMO) | Admitting: Anesthesiology

## 2022-05-05 ENCOUNTER — Ambulatory Visit (HOSPITAL_BASED_OUTPATIENT_CLINIC_OR_DEPARTMENT_OTHER)
Admission: RE | Admit: 2022-05-05 | Discharge: 2022-05-05 | Disposition: A | Discharge status: Home / Self Care | Payer: Managed Care, Other (non HMO) | Attending: Plastic Surgery | Admitting: Plastic Surgery

## 2022-05-05 ENCOUNTER — Other Ambulatory Visit: Payer: Self-pay

## 2022-05-05 DIAGNOSIS — C50911 Malignant neoplasm of unspecified site of right female breast: Secondary | ICD-10-CM

## 2022-05-05 DIAGNOSIS — Z45811 Encounter for adjustment or removal of right breast implant: Secondary | ICD-10-CM | POA: Insufficient documentation

## 2022-05-05 DIAGNOSIS — E119 Type 2 diabetes mellitus without complications: Secondary | ICD-10-CM | POA: Insufficient documentation

## 2022-05-05 DIAGNOSIS — Z9011 Acquired absence of right breast and nipple: Secondary | ICD-10-CM | POA: Diagnosis not present

## 2022-05-05 DIAGNOSIS — Z853 Personal history of malignant neoplasm of breast: Secondary | ICD-10-CM | POA: Insufficient documentation

## 2022-05-05 DIAGNOSIS — R030 Elevated blood-pressure reading, without diagnosis of hypertension: Secondary | ICD-10-CM

## 2022-05-05 HISTORY — PX: REMOVAL OF TISSUE EXPANDER AND PLACEMENT OF IMPLANT: SHX6457

## 2022-05-05 SURGERY — REMOVAL, TISSUE EXPANDER, BREAST, WITH IMPLANT INSERTION
Anesthesia: General | Site: Breast | Laterality: Right

## 2022-05-05 MED ORDER — PROPOFOL 10 MG/ML IV BOLUS
INTRAVENOUS | Status: DC | PRN
  Administered 2022-05-05: 200 mg via INTRAVENOUS

## 2022-05-05 MED ORDER — EPHEDRINE 5 MG/ML INJ
INTRAVENOUS | Status: AC
  Filled 2022-05-05: qty 5

## 2022-05-05 MED ORDER — EPHEDRINE SULFATE (PRESSORS) 50 MG/ML IJ SOLN
INTRAMUSCULAR | Status: DC | PRN
  Administered 2022-05-05 (×2): 10 mg via INTRAVENOUS

## 2022-05-05 MED ORDER — DEXAMETHASONE SODIUM PHOSPHATE 4 MG/ML IJ SOLN
INTRAMUSCULAR | Status: DC | PRN
  Administered 2022-05-05: 5 mg via INTRAVENOUS

## 2022-05-05 MED ORDER — CHLORHEXIDINE GLUCONATE CLOTH 2 % EX PADS: 6.0000 | MEDICATED_PAD | Freq: Once | CUTANEOUS | Status: DC

## 2022-05-05 MED ORDER — 0.9 % SODIUM CHLORIDE (POUR BTL) OPTIME
TOPICAL | Status: DC | PRN
  Administered 2022-05-05: 300 mL

## 2022-05-05 MED ORDER — BUPIVACAINE-EPINEPHRINE (PF) 0.5% -1:200000 IJ SOLN
INTRAMUSCULAR | Status: AC
  Filled 2022-05-05: qty 30

## 2022-05-05 MED ORDER — ATROPINE SULFATE 0.4 MG/ML IV SOLN
INTRAVENOUS | Status: AC
  Filled 2022-05-05: qty 1

## 2022-05-05 MED ORDER — CEFAZOLIN SODIUM-DEXTROSE 2-4 GM/100ML-% IV SOLN
INTRAVENOUS | Status: AC
  Filled 2022-05-05: qty 100

## 2022-05-05 MED ORDER — SUCCINYLCHOLINE CHLORIDE 200 MG/10ML IV SOSY
PREFILLED_SYRINGE | INTRAVENOUS | Status: AC
  Filled 2022-05-05: qty 10

## 2022-05-05 MED ORDER — PHENYLEPHRINE 80 MCG/ML (10ML) SYRINGE FOR IV PUSH (FOR BLOOD PRESSURE SUPPORT)
PREFILLED_SYRINGE | INTRAVENOUS | Status: AC
  Filled 2022-05-05: qty 10

## 2022-05-05 MED ORDER — POVIDONE-IODINE 10 % EX SOLN
CUTANEOUS | Status: DC | PRN
  Administered 2022-05-05: 1 via TOPICAL

## 2022-05-05 MED ORDER — FENTANYL CITRATE (PF) 100 MCG/2ML IJ SOLN
INTRAMUSCULAR | Status: AC
  Filled 2022-05-05: qty 2

## 2022-05-05 MED ORDER — DROPERIDOL 2.5 MG/ML IJ SOLN
INTRAMUSCULAR | Status: DC | PRN
  Administered 2022-05-05: .625 mg via INTRAVENOUS

## 2022-05-05 MED ORDER — LIDOCAINE 2% (20 MG/ML) 5 ML SYRINGE
INTRAMUSCULAR | Status: AC
  Filled 2022-05-05: qty 5

## 2022-05-05 MED ORDER — MIDAZOLAM HCL 2 MG/2ML IJ SOLN
INTRAMUSCULAR | Status: AC
  Filled 2022-05-05: qty 2

## 2022-05-05 MED ORDER — PROPOFOL 500 MG/50ML IV EMUL
INTRAVENOUS | Status: AC
  Filled 2022-05-05: qty 50

## 2022-05-05 MED ORDER — ONDANSETRON HCL 4 MG/2ML IJ SOLN
INTRAMUSCULAR | Status: DC | PRN
  Administered 2022-05-05: 4 mg via INTRAVENOUS

## 2022-05-05 MED ORDER — PHENYLEPHRINE HCL (PRESSORS) 10 MG/ML IV SOLN
INTRAVENOUS | Status: DC | PRN
  Administered 2022-05-05: 160 ug via INTRAVENOUS
  Administered 2022-05-05: 40 ug via INTRAVENOUS

## 2022-05-05 MED ORDER — OXYCODONE HCL 5 MG/5ML PO SOLN: 5.0000 mg | Freq: Once | ORAL | Status: DC | PRN

## 2022-05-05 MED ORDER — SODIUM CHLORIDE 0.9 % IV SOLN
INTRAVENOUS | Status: DC | PRN
  Administered 2022-05-05: 200 mL

## 2022-05-05 MED ORDER — LACTATED RINGERS IV SOLN: INTRAVENOUS | Status: DC

## 2022-05-05 MED ORDER — LIDOCAINE HCL (CARDIAC) PF 100 MG/5ML IV SOSY
PREFILLED_SYRINGE | INTRAVENOUS | Status: DC | PRN
  Administered 2022-05-05: 100 mg via INTRAVENOUS

## 2022-05-05 MED ORDER — FENTANYL CITRATE (PF) 100 MCG/2ML IJ SOLN: 25.0000 ug | INTRAMUSCULAR | Status: DC | PRN

## 2022-05-05 MED ORDER — ACETAMINOPHEN 10 MG/ML IV SOLN: 1000.0000 mg | Freq: Once | INTRAVENOUS | Status: DC | PRN

## 2022-05-05 MED ORDER — ONDANSETRON HCL 4 MG/2ML IJ SOLN: 4.0000 mg | Freq: Once | INTRAMUSCULAR | Status: DC | PRN

## 2022-05-05 MED ORDER — FENTANYL CITRATE (PF) 100 MCG/2ML IJ SOLN
INTRAMUSCULAR | Status: DC | PRN
  Administered 2022-05-05 (×2): 50 ug via INTRAVENOUS

## 2022-05-05 MED ORDER — OXYCODONE HCL 5 MG PO TABS: 5.0000 mg | ORAL_TABLET | Freq: Once | ORAL | Status: DC | PRN

## 2022-05-05 MED ORDER — CEFAZOLIN SODIUM-DEXTROSE 2-4 GM/100ML-% IV SOLN
2.0000 g | INTRAVENOUS | Status: AC
  Administered 2022-05-05: 2 g via INTRAVENOUS

## 2022-05-05 MED ORDER — MIDAZOLAM HCL 5 MG/5ML IJ SOLN
INTRAMUSCULAR | Status: DC | PRN
  Administered 2022-05-05: 2 mg via INTRAVENOUS

## 2022-05-05 MED ORDER — DEXAMETHASONE SODIUM PHOSPHATE 10 MG/ML IJ SOLN
INTRAMUSCULAR | Status: AC
  Filled 2022-05-05: qty 1

## 2022-05-05 MED ORDER — ONDANSETRON HCL 4 MG/2ML IJ SOLN
INTRAMUSCULAR | Status: AC
  Filled 2022-05-05: qty 2

## 2022-05-05 SURGICAL SUPPLY — 65 items
ADH SKN CLS APL DERMABOND .7 (GAUZE/BANDAGES/DRESSINGS) ×1
APL PRP STRL LF DISP 70% ISPRP (MISCELLANEOUS) ×2
BAG DECANTER FOR FLEXI CONT (MISCELLANEOUS) ×1 IMPLANT
BINDER BREAST LRG (GAUZE/BANDAGES/DRESSINGS) IMPLANT
BINDER BREAST XLRG (GAUZE/BANDAGES/DRESSINGS) IMPLANT
BINDER BREAST XXLRG (GAUZE/BANDAGES/DRESSINGS) IMPLANT
BIOPATCH RED 1 DISK 7.0 (GAUZE/BANDAGES/DRESSINGS) IMPLANT
BLADE SURG 10 STRL SS (BLADE) IMPLANT
BLADE SURG 15 STRL LF DISP TIS (BLADE) ×1 IMPLANT
BLADE SURG 15 STRL SS (BLADE) ×1
CANISTER SUCT 1200ML W/VALVE (MISCELLANEOUS) ×1 IMPLANT
CHLORAPREP W/TINT 26 (MISCELLANEOUS) ×2 IMPLANT
DERMABOND ADVANCED .7 DNX12 (GAUZE/BANDAGES/DRESSINGS) IMPLANT
DRAIN CHANNEL 15F RND FF W/TCR (WOUND CARE) IMPLANT
ELECT BLADE 4.0 EZ CLEAN MEGAD (MISCELLANEOUS)
ELECT COATED BLADE 2.86 ST (ELECTRODE) ×1 IMPLANT
ELECT REM PT RETURN 9FT ADLT (ELECTROSURGICAL) ×1
ELECTRODE BLDE 4.0 EZ CLN MEGD (MISCELLANEOUS) IMPLANT
ELECTRODE REM PT RTRN 9FT ADLT (ELECTROSURGICAL) ×1 IMPLANT
EVACUATOR SILICONE 100CC (DRAIN) IMPLANT
GAUZE PAD ABD 8X10 STRL (GAUZE/BANDAGES/DRESSINGS) ×2 IMPLANT
GLOVE BIO SURGEON STRL SZ 6.5 (GLOVE) IMPLANT
GLOVE BIO SURGEON STRL SZ7.5 (GLOVE) IMPLANT
GLOVE BIO SURGEON STRL SZ8 (GLOVE) ×1 IMPLANT
GLOVE BIOGEL M STRL SZ7.5 (GLOVE) ×1 IMPLANT
GLOVE BIOGEL PI IND STRL 7.5 (GLOVE) ×1 IMPLANT
GLOVE BIOGEL PI IND STRL 8 (GLOVE) ×1 IMPLANT
GOWN STRL REUS W/ TWL LRG LVL3 (GOWN DISPOSABLE) ×2 IMPLANT
GOWN STRL REUS W/ TWL XL LVL3 (GOWN DISPOSABLE) ×1 IMPLANT
GOWN STRL REUS W/TWL LRG LVL3 (GOWN DISPOSABLE) ×2
GOWN STRL REUS W/TWL XL LVL3 (GOWN DISPOSABLE) ×1
IMPL GEL HI PROFILE 375CC (Breast) IMPLANT
IMPLANT GEL HI PROFILE 375CC (Breast) ×1 IMPLANT
IV NS 500ML (IV SOLUTION)
IV NS 500ML BAXH (IV SOLUTION) IMPLANT
KIT FILL ASEPTIC TRANSFER (MISCELLANEOUS) IMPLANT
MARKER SKIN DUAL TIP RULER LAB (MISCELLANEOUS) IMPLANT
NDL HYPO 25X1 1.5 SAFETY (NEEDLE) ×1 IMPLANT
NEEDLE HYPO 25X1 1.5 SAFETY (NEEDLE) ×1 IMPLANT
PACK BASIN DAY SURGERY FS (CUSTOM PROCEDURE TRAY) ×1 IMPLANT
PACK UNIVERSAL I (CUSTOM PROCEDURE TRAY) ×1 IMPLANT
PENCIL SMOKE EVACUATOR (MISCELLANEOUS) ×1 IMPLANT
PIN SAFETY STERILE (MISCELLANEOUS) IMPLANT
SIZER BREAST REUSE 375CC (SIZER) ×1
SIZER BREAST REUSE 400CC (SIZER) ×1
SIZER BRST REUSE 12.2 400CC (SIZER) IMPLANT
SIZER BRST REUSE P4.8 12 375CC (SIZER) IMPLANT
SLEEVE SCD COMPRESS KNEE MED (STOCKING) ×1 IMPLANT
SPIKE FLUID TRANSFER (MISCELLANEOUS) IMPLANT
SPONGE T-LAP 18X18 ~~LOC~~+RFID (SPONGE) ×1 IMPLANT
STAPLER INSORB 30 2030 C-SECTI (MISCELLANEOUS) IMPLANT
STAPLER VISISTAT 35W (STAPLE) ×1 IMPLANT
STRIP SUTURE WOUND CLOSURE 1/2 (MISCELLANEOUS) ×2 IMPLANT
SUT MNCRL AB 3-0 PS2 27 (SUTURE) ×2 IMPLANT
SUT MNCRL AB 4-0 PS2 18 (SUTURE) ×2 IMPLANT
SUT PDS 3-0 CT2 (SUTURE) ×5
SUT PDS AB 4-0 SH 27 (SUTURE) IMPLANT
SUT PDS II 3-0 CT2 27 ABS (SUTURE) IMPLANT
SUT SILK 2 0 SH (SUTURE) IMPLANT
SYR BULB IRRIG 60ML STRL (SYRINGE) ×1 IMPLANT
SYR CONTROL 10ML LL (SYRINGE) ×1 IMPLANT
TOWEL GREEN STERILE FF (TOWEL DISPOSABLE) ×2 IMPLANT
TUBE CONNECTING 20X1/4 (TUBING) ×1 IMPLANT
UNDERPAD 30X36 HEAVY ABSORB (UNDERPADS AND DIAPERS) ×2 IMPLANT
YANKAUER SUCT BULB TIP NO VENT (SUCTIONS) ×1 IMPLANT

## 2022-05-05 NOTE — Op Note (Signed)
DATE OF OPERATION: 05/05/2022  LOCATION: [facility] operating Room  PREOPERATIVE DIAGNOSIS: Right breast cancer, status post right breast reconstruction  POSTOPERATIVE DIAGNOSIS: Same  PROCEDURE: Removal of right tissue expander and replacement with 375 cc high-profile Mentor gel implant, capsulorrhaphy  SURGEON: Jeanann Lewandowsky, MD  ASSISTANT: Krista Blue, primary: Donnamarie Rossetti  EBL: 10 cc  CONDITION: Stable  COMPLICATIONS: None  INDICATION: The patient, Robin Farley, is a 57 y.o. female born on 04/01/1966, is here for treatment right breast after mastectomy.  Completion of reconstruction  PROCEDURE DETAILS:  The patient was seen prior to surgery and marked.   IV antibiotics were given. The patient was taken to the operating room and given a general anesthetic. A standard time out was performed and all information was confirmed by those in the room. SCDs were placed.   The right breast was prepped and draped in usual sterile manner.  The scar was excised and dissection carried out to the very thin skin flap to the tissue expander.  The tissue expander was removed and the cavity inspected.  The ADM was completely incorporated throughout.  A 400 cc gel sizer sizer was placed in the cavity and the ADM was pulled across the sizer.  The closure was very tight and I felt too tight to allow for a safe closure.  The 400 cc sizer was removed and a 375 cc sizer was placed.  The closure was still tight so I removed the sizer and elected to do a circumferential scoring of the ADM from 9:00 through 3:00 and radial scoring at the inframammary fold.  This allowed for a little additional room and I was able to to close the ADM over the sizer without difficulty.  3-0 PDS sutures were placed in an interrupted untied fashion in the ADM and the sizer was removed.  The skin was reprepped with Betadine fresh towels were placed and the cavity was irrigated with an antibiotic containing saline solution.   The entire surgical team changed gloves and the 375 cc high-profile gel Mentor implant was placed in the wound without difficulty and the previously placed sutures were tied.  With the ADM closed the remainder of the skin flaps measured approximately 5 mm at the thickest point these were closed in layers with 3-0 Monocryl in the dermis and 4-0 Monocryl in the skin.  The skin incision was sealed with Dermabond.  The patient was placed in a lightly compressive garment to hold an ABD pad in place over the incision The patient was allowed to wake up and taken to recovery room in stable condition at the end of the case. The family was notified at the end of the case.   The advanced practice practitioner (APP) assisted throughout the case.  The APP was essential in retraction and counter traction when needed to make the case progress smoothly.  This retraction and assistance made it possible to see the tissue plans for the procedure.  The assistance was needed for blood control, tissue re-approximation and assisted with closure of the incision site.

## 2022-05-05 NOTE — Transfer of Care (Signed)
Immediate Anesthesia Transfer of Care Note  Patient: Robin Farley  Procedure(s) Performed: REMOVAL OF TISSUE EXPANDER AND PLACEMENT OF IMPLANT (Right: Breast)  Patient Location: PACU  Anesthesia Type:General  Level of Consciousness: awake, alert , drowsy, and patient cooperative  Airway & Oxygen Therapy: Patient Spontanous Breathing and Patient connected to face mask oxygen  Post-op Assessment: Report given to RN and Post -op Vital signs reviewed and stable  Post vital signs: Reviewed and stable  Last Vitals:  Vitals Value Taken Time  BP 130/60 05/05/22 0906  Temp    Pulse 86 05/05/22 0908  Resp 18 05/05/22 0908  SpO2 100 % 05/05/22 0908  Vitals shown include unvalidated device data.  Last Pain:  Vitals:   05/05/22 0626  TempSrc: Oral  PainSc: 1       Patients Stated Pain Goal: 8 (64/68/03 2122)  Complications: No notable events documented.

## 2022-05-05 NOTE — Anesthesia Preprocedure Evaluation (Signed)
Anesthesia Evaluation  Patient identified by MRN, date of birth, ID band Patient awake    Reviewed: Allergy & Precautions, H&P , NPO status , Patient's Chart, lab work & pertinent test results  Airway Mallampati: II  TM Distance: >3 FB Neck ROM: Full    Dental no notable dental hx.    Pulmonary neg pulmonary ROS   Pulmonary exam normal breath sounds clear to auscultation       Cardiovascular negative cardio ROS Normal cardiovascular exam Rhythm:Regular Rate:Normal     Neuro/Psych negative neurological ROS  negative psych ROS   GI/Hepatic Neg liver ROS,GERD  ,,  Endo/Other  diabetes, Type 2    Renal/GU negative Renal ROS  negative genitourinary   Musculoskeletal negative musculoskeletal ROS (+)    Abdominal   Peds negative pediatric ROS (+)  Hematology negative hematology ROS (+)   Anesthesia Other Findings   Reproductive/Obstetrics negative OB ROS                             Anesthesia Physical Anesthesia Plan  ASA: 2  Anesthesia Plan: General   Post-op Pain Management: Toradol IV (intra-op)*   Induction: Intravenous  PONV Risk Score and Plan: 3 and Ondansetron, Dexamethasone, Midazolam and Treatment may vary due to age or medical condition  Airway Management Planned: LMA  Additional Equipment:   Intra-op Plan:   Post-operative Plan: Extubation in OR  Informed Consent: I have reviewed the patients History and Physical, chart, labs and discussed the procedure including the risks, benefits and alternatives for the proposed anesthesia with the patient or authorized representative who has indicated his/her understanding and acceptance.     Dental advisory given  Plan Discussed with: CRNA and Surgeon  Anesthesia Plan Comments:        Anesthesia Quick Evaluation

## 2022-05-05 NOTE — Interval H&P Note (Signed)
History and Physical Interval Note:\ Met [name] and her husband in the pre-op area this morning. No change in physical exam or indication for surgery.  Surgical site marked with her assistance. Will proceed with TE to implant exchange at her request.  05/05/2022 7:09 AM  [name]  has presented today for surgery, with the diagnosis of History of breast reconstruction.  The various methods of treatment have been discussed with the patient and family. After consideration of risks, benefits and other options for treatment, the patient has consented to  Procedure(s): REMOVAL OF TISSUE EXPANDER AND PLACEMENT OF IMPLANT (Right) as a surgical intervention.  The patient's history has been reviewed, patient examined, no change in status, stable for surgery.  I have reviewed the patient's chart and labs.  Questions were answered to the patient's satisfaction.     Camillia Herter

## 2022-05-05 NOTE — Anesthesia Procedure Notes (Signed)
Procedure Name: LMA Insertion Date/Time: 05/05/2022 7:29 AM  Performed by: Willa Frater, CRNAPre-anesthesia Checklist: Patient identified, Emergency Drugs available, Suction available and Patient being monitored Patient Re-evaluated:Patient Re-evaluated prior to induction Oxygen Delivery Method: Circle system utilized Preoxygenation: Pre-oxygenation with 100% oxygen Induction Type: IV induction Ventilation: Mask ventilation without difficulty LMA: LMA inserted LMA Size: 4.0 Number of attempts: 1 Airway Equipment and Method: Bite block Placement Confirmation: positive ETCO2 Tube secured with: Tape Dental Injury: Teeth and Oropharynx as per pre-operative assessment

## 2022-05-05 NOTE — Anesthesia Postprocedure Evaluation (Signed)
Anesthesia Post Note  Patient: Robin Farley  Procedure(s) Performed: REMOVAL OF TISSUE EXPANDER AND PLACEMENT OF IMPLANT (Right: Breast)     Patient location during evaluation: PACU Anesthesia Type: General Level of consciousness: awake and alert Pain management: pain level controlled Vital Signs Assessment: post-procedure vital signs reviewed and stable Respiratory status: spontaneous breathing, nonlabored ventilation, respiratory function stable and patient connected to nasal cannula oxygen Cardiovascular status: blood pressure returned to baseline and stable Postop Assessment: no apparent nausea or vomiting Anesthetic complications: no  No notable events documented.  Last Vitals:  Vitals:   05/05/22 0915 05/05/22 0930  BP: 113/69 120/69  Pulse: 89 88  Resp: (!) 21 17  Temp:    SpO2: 100% 99%    Last Pain:  Vitals:   05/05/22 0930  TempSrc:   PainSc: 0-No pain                 Glynda Soliday S

## 2022-05-05 NOTE — Discharge Instructions (Addendum)
Activity: Avoid strenuous activity.  No lifting, pushing, or pulling greater than 15 pounds.  Diet: No restrictions.  Try to optimize nutrition with plenty of proteins, fruits, and vegetables to improve healing.   Wound Care: Leave breast binder on for the first week and then you may transition to a front-clipping or front-zipping compression bra.  Sponge bathe x2 days, then you can transition to showering.  Your implant was placed and then incision was closed with sutures.  Dermabond (skin glue) was then placed over top of the incision.  Do not pick at it.  Do not use bacitracin or other petroleum-based products on it.  ABD pad for comfort.  Follow-Up: As scheduled.  Things to watch for:  Call the office if you experience fever, chills, intractable vomiting, severe pain or swelling, or significant bleeding.     Post Anesthesia Home Care Instructions  Activity: Get plenty of rest for the remainder of the day. A responsible individual must stay with you for 24 hours following the procedure.  For the next 24 hours, DO NOT: -Drive a car -Paediatric nurse -Drink alcoholic beverages -Take any medication unless instructed by your physician -Make any legal decisions or sign important papers.  Meals: Start with liquid foods such as gelatin or soup. Progress to regular foods as tolerated. Avoid greasy, spicy, heavy foods. If nausea and/or vomiting occur, drink only clear liquids until the nausea and/or vomiting subsides. Call your physician if vomiting continues.  Special Instructions/Symptoms: Your throat may feel dry or sore from the anesthesia or the breathing tube placed in your throat during surgery. If this causes discomfort, gargle with warm salt water. The discomfort should disappear within 24 hours.  If you had a scopolamine patch placed behind your ear for the management of post- operative nausea and/or vomiting:  1. The medication in the patch is effective for 72 hours, after which  it should be removed.  Wrap patch in a tissue and discard in the trash. Wash hands thoroughly with soap and water. 2. You may remove the patch earlier than 72 hours if you experience unpleasant side effects which may include dry mouth, dizziness or visual disturbances. 3. Avoid touching the patch. Wash your hands with soap and water after contact with the patch.

## 2022-05-06 ENCOUNTER — Encounter (HOSPITAL_BASED_OUTPATIENT_CLINIC_OR_DEPARTMENT_OTHER): Payer: Self-pay | Admitting: Plastic Surgery

## 2022-05-06 NOTE — Progress Notes (Signed)
Left message stating courtesy call and if any questions or concerns please call the doctors office.  

## 2022-05-14 ENCOUNTER — Encounter: Payer: Self-pay | Admitting: Plastic Surgery

## 2022-05-14 ENCOUNTER — Ambulatory Visit: Payer: Managed Care, Other (non HMO) | Admitting: Plastic Surgery

## 2022-05-14 VITALS — BP 122/77 | HR 85

## 2022-05-14 DIAGNOSIS — C50911 Malignant neoplasm of unspecified site of right female breast: Secondary | ICD-10-CM

## 2022-05-14 DIAGNOSIS — Z9889 Other specified postprocedural states: Secondary | ICD-10-CM

## 2022-05-14 NOTE — Progress Notes (Signed)
Ms. Vanorman is 9 days postop from a right removal of tissue expander and placement of implant.  She is doing well with no complaints other than that she wishes she could be larger on the right side.  No complaints of fever chills or discomfort with the incision.  On examination the incision is intact with no surrounding erythema.  The left breast is very similar in volume to the right breast and a balancing procedure should be relatively easy to perform.  Patient will begin scar massage next week and slowly increase activity.  She will follow-up with me in 6 to 7 weeks sooner if needed.

## 2022-05-27 ENCOUNTER — Encounter: Payer: Managed Care, Other (non HMO) | Admitting: Physician Assistant

## 2022-06-09 NOTE — Progress Notes (Signed)
Patient is a pleasant 57 year old female with PMH of right-sided breast cancer s/p unilateral mastectomy with reconstruction and implant exchange performed 05/05/2022 by Dr. Lovena Le.  Reviewed operative note and a 375 cc high-profile Mentor gel implant was placed at time of expander removal and capsulotomy.    She was last seen here in clinic on 05/14/2022 by Dr. Lovena Le.  At that time, she expressed that she was open be larger on the right side.  Incision CDI.  Exam entirely unremarkable.  Plan for left-sided lift for symmetry at later date.  Today,

## 2022-06-10 ENCOUNTER — Ambulatory Visit (INDEPENDENT_AMBULATORY_CARE_PROVIDER_SITE_OTHER): Payer: Managed Care, Other (non HMO) | Admitting: Physician Assistant

## 2022-06-10 VITALS — BP 116/77 | HR 91

## 2022-06-10 DIAGNOSIS — Z9889 Other specified postprocedural states: Secondary | ICD-10-CM

## 2022-06-17 ENCOUNTER — Ambulatory Visit (INDEPENDENT_AMBULATORY_CARE_PROVIDER_SITE_OTHER): Payer: Managed Care, Other (non HMO) | Admitting: Plastic Surgery

## 2022-06-17 DIAGNOSIS — Z9889 Other specified postprocedural states: Secondary | ICD-10-CM

## 2022-06-17 NOTE — Progress Notes (Signed)
[name] returns today for evaluation after tissue expander to implant exchange.  She has done well but was noted at her last appointment to have a very small eschar over the medial aspect of the incision.  This has actually improved since she was seen approximately a week ago.  She denies any fever chills or increased pain.  On physical exam the incision is healing well.  There is a small eschar in the medial aspect of the incision but there is no surrounding erythema.  Status post breast reconstruction: The patient is doing well and I do not believe that she has anything to be concerned about and related this to her.  I have encouraged her to focus her diet on vitamin A, vitamin C, zinc and protein.  Continue her scar massage.  She states that she is leaving tomorrow for a vacation in [place].  I believe that this is fine that she should do well and have not given her any specific instructions other than those that we already discussed.  She will return to see me in 3 weeks.

## 2022-07-06 ENCOUNTER — Ambulatory Visit: Payer: Managed Care, Other (non HMO) | Attending: Hematology and Oncology

## 2022-07-06 VITALS — Wt 162.4 lb

## 2022-07-06 DIAGNOSIS — Z483 Aftercare following surgery for neoplasm: Secondary | ICD-10-CM | POA: Insufficient documentation

## 2022-07-06 NOTE — Therapy (Signed)
OUTPATIENT PHYSICAL THERAPY SOZO SCREENING NOTE   Patient Name: Robin Farley MRN: [id number] DOB:November 17, 1965, 57 y.o., female Today's Date: 07/06/2022  PCP: [name], MD REFERRING PROVIDER: Benay Pike, MD   PT End of Session - 07/06/22 1540     Visit Number 2   # unchanged due to screen only   PT Start Time 1539    PT Stop Time 1542    PT Time Calculation (min) 3 min    Activity Tolerance Patient tolerated treatment well    Behavior During Therapy [facility] for tasks assessed/performed             Past Medical History:  Diagnosis Date   Abdominal pain, generalized 12/27/2008   Acute sinusitis, unspecified 02/26/2007   ADD 03/13/2010   ALLERGIC RHINITIS 02/23/2007   ANEMIA 02/05/2007   Anxiety 11/17/2011   ATTENTION DEFICIT DISORDER, HX OF 02/05/2007   Breast cancer ([place])    Deviated nasal septum 02/05/2007   Diabetes mellitus without complication ([place])    Family history of breast cancer 11/20/2020   Family history of melanoma 11/20/2020   FATIGUE 02/23/2007   HYPERLIPIDEMIA 02/23/2007   Personal history of malignant melanoma 11/20/2020   Port-A-Cath in place 12/03/2020   SWELLING MASS OR LUMP IN HEAD AND NECK 07/27/2008   UTI 02/23/2007   Past Surgical History:  Procedure Laterality Date   BREAST RECONSTRUCTION WITH PLACEMENT OF TISSUE EXPANDER AND FLEX HD (ACELLULAR HYDRATED DERMIS) Right 05/08/2021   Procedure: BREAST RECONSTRUCTION WITH PLACEMENT OF TISSUE EXPANDER AND FLEX HD (ACELLULAR HYDRATED DERMIS);  Surgeon: Cindra Presume, MD;  Location: [place];  Service: Plastics;  Laterality: Right;   BROW LIFT     CESAREAN SECTION     COSMETIC SURGERY  1995   Nasal   MASTECTOMY W/ SENTINEL NODE BIOPSY Right 05/08/2021   Procedure: RIGHT MASTECTOMY WITH SENTINEL LYMPH NODE BIOPSY;  Surgeon: Donnie Mesa, MD;  Location: [place];  Service: General;  Laterality: Right;   MELANOMA EXCISION WITH SENTINEL LYMPH NODE BIOPSY Left  10/24/2012   Procedure: MELANOMA wide EXCISION left lateral thigh WITH SENTINEL LYMPH NODE BIOPSY left groin;  Surgeon: Edward Jolly, MD;  Location: [place];  Service: General;  Laterality: Left;   NASAL SEPTUM SURGERY     PORTACATH PLACEMENT Right 12/02/2020   Procedure: INSERTION PORT-A-CATH;  Surgeon: Donnie Mesa, MD;  Location: [place];  Service: General;  Laterality: Right;   RADIOACTIVE SEED GUIDED AXILLARY SENTINEL LYMPH NODE Right 05/08/2021   Procedure: RADIOACTIVE SEED GUIDED AXILLARY SENTINEL LYMPH NODE DISSECTION;  Surgeon: Donnie Mesa, MD;  Location: [place];  Service: General;  Laterality: Right;   REMOVAL OF TISSUE EXPANDER AND PLACEMENT OF IMPLANT Right 05/05/2022   Procedure: REMOVAL OF TISSUE EXPANDER AND PLACEMENT OF IMPLANT;  Surgeon: Camillia Herter, MD;  Location: [place];  Service: Plastics;  Laterality: Right;   Patient Active Problem List   Diagnosis Date Noted   Elevated LFTs 12/10/2020   Genetic testing 12/06/2020   Family history of breast cancer 11/20/2020   Personal history of malignant melanoma 11/20/2020   Family history of melanoma 11/20/2020   Malignant neoplasm of upper-outer quadrant of right breast in female, estrogen receptor negative ([place]) 11/12/2020   Vitamin D deficiency 06/02/2019   Hyperglycemia 06/02/2019   Blood pressure elevated without history of HTN 06/02/2019   Headache 12/16/2018   Urinary incontinence, urge 12/14/2017   Calcific bursitis of shoulder  07/10/2015   Nausea alone 04/27/2013   Melanoma of thigh ([place]) 10/06/2012   Anxiety 11/17/2011   Rosacea 07/08/2011   Right shoulder pain 07/08/2011   Knee effusion, left 08/18/2010   Encounter for well adult exam with abnormal findings 08/18/2010   Attention deficit disorder 03/13/2010   GERD 12/27/2008   Hyperlipidemia 02/23/2007   DEPRESSION 02/23/2007   ALLERGIC RHINITIS 02/23/2007   FATIGUE  02/23/2007   ANEMIA 02/05/2007   Deviated nasal septum 02/05/2007    REFERRING DIAG: right breast cancer at risk for lymphedema  THERAPY DIAG: Aftercare following surgery for neoplasm  PERTINENT HISTORY: 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  She had neoadjuvant chemo, and had  a right mastectomy with immediate expander on 05/08/2021. She will be having radiation.   PRECAUTIONS: right UE Lymphedema risk, None  SUBJECTIVE: Pt returns for her 3 month L-Dex screen.   PAIN:  Are you having pain? No  SOZO SCREENING: Patient was assessed today using the SOZO machine to determine the lymphedema index score. This was compared to her baseline score. It was determined that she is within the recommended range when compared to her baseline and no further action is needed at this time. She will continue SOZO screenings. These are done every 3 months for 2 years post operatively followed by every 6 months for 2 years, and then annually.   L-DEX FLOWSHEETS - 07/06/22 1500       L-DEX LYMPHEDEMA SCREENING   Measurement Type Unilateral    L-DEX MEASUREMENT EXTREMITY Upper Extremity    POSITION  Standing    DOMINANT SIDE Right    At Risk Side Right    BASELINE SCORE (UNILATERAL) 0.9    L-DEX SCORE (UNILATERAL) 0.7    VALUE CHANGE (UNILAT) -0.2              Otelia Limes, PTA 07/06/2022, 3:42 PM

## 2022-07-08 ENCOUNTER — Ambulatory Visit: Payer: Managed Care, Other (non HMO) | Admitting: Plastic Surgery

## 2022-07-08 ENCOUNTER — Encounter: Payer: Self-pay | Admitting: Plastic Surgery

## 2022-07-08 VITALS — BP 132/84 | HR 92 | Ht 66.0 in | Wt 164.6 lb

## 2022-07-08 DIAGNOSIS — Z9889 Other specified postprocedural states: Secondary | ICD-10-CM

## 2022-07-08 NOTE — Progress Notes (Signed)
Ms. Steckel returns today for evaluation after tissue expander to implant exchange at the end of January.  She reports that she has not had any problems however she still has not completely healed her incision.  We have discussed this in the past that this was a possibility due to her previous radiation.  Physical examination the incision is still intact there are some areas of scab on the incision but there is no erythema around the incision and she is having no pain on palpation.  At this time I am inclined just to continue following her.  She will continue massage of both the implant and the incision.  I would prefer not to do the breast lift on the left until we are sure that she is completely healed the incision on the right.  She will follow-up with me or with the PAs in 2 weeks.

## 2022-07-23 ENCOUNTER — Encounter: Payer: Managed Care, Other (non HMO) | Admitting: Plastic Surgery

## 2022-07-24 ENCOUNTER — Encounter: Payer: Self-pay | Admitting: Plastic Surgery

## 2022-07-24 ENCOUNTER — Telehealth (INDEPENDENT_AMBULATORY_CARE_PROVIDER_SITE_OTHER): Payer: Managed Care, Other (non HMO) | Admitting: Physician Assistant

## 2022-07-24 ENCOUNTER — Telehealth: Payer: Self-pay | Admitting: *Deleted

## 2022-07-24 ENCOUNTER — Other Ambulatory Visit: Payer: Self-pay

## 2022-07-24 ENCOUNTER — Encounter (HOSPITAL_COMMUNITY): Payer: Self-pay | Admitting: Plastic Surgery

## 2022-07-24 DIAGNOSIS — Z9889 Other specified postprocedural states: Secondary | ICD-10-CM

## 2022-07-24 MED ORDER — DOXYCYCLINE HYCLATE 100 MG PO TABS: 100.0000 mg | ORAL_TABLET | Freq: Two times a day (BID) | ORAL | 0 refills | Status: AC

## 2022-07-24 NOTE — Progress Notes (Signed)
SDW call  Patient was given pre-op instructions over the phone. Patient verbalized understanding of instructions provided.     PCP -  Cardiologist - Denies Pulmonary: Denies   PPM/ICD - Denies   Chest x-ray - 12/02/20 EKG -  05/04/22 Stress Test - ECHO - 11/26/20 Cardiac Cath -   Sleep Study/sleep apnea/CPAP: Denies  Type II diabetic: Patient states she had an issue years ago with elevated BS and was started on Metformin.  States she no longer takes Metformin and would like diagnosis of diabetes removed.  Fasting Blood sugar range: does not check sugars How often check sugars: does not check sugars   Blood Thinner Instructions: Denies Aspirin Instructions: Denies   ERAS Protcol - Yes, clear liquids until 1045 PRE-SURGERY Ensure or G2- No   COVID TEST- n/a     Anesthesia review: No   Patient denies shortness of breath, fever, cough and chest pain over the phone call    Your procedure is scheduled on Monday July 27, 2022  Report to [facility] Main Entrance "A" at  1115  A.M., then check in with the Admitting office.  Call this number if you have problems the morning of surgery:  [phone]   If you have any questions prior to your surgery date call [phone]: Open Monday-Friday 8am-4pm If you experience any cold or flu symptoms such as cough, fever, chills, shortness of breath, etc. between now and your scheduled surgery, please notify us at the above number     Remember:  Do not eat after midnight the night before your surgery  You may drink clear liquids until  1045   the morning of your surgery.   Clear liquids allowed are: Water, Non-Citrus Juices (without pulp), Carbonated Beverages, Clear Tea, Black Coffee ONLY (NO MILK, CREAM OR POWDERED CREAMER of any kind), and Gatorade   Take these medicines the morning of surgery with A SIP OF WATER:  Doxycycline  As of today, STOP taking any Aspirin (unless otherwise instructed by your surgeon) Aleve, Naproxen,  Ibuprofen, Motrin, Advil, Goody's, BC's, all herbal medications, fish oil, and all vitamins.

## 2022-07-24 NOTE — Telephone Encounter (Signed)
Please see message/picture from patient as well as my telephone encounter for today. Thank you

## 2022-07-24 NOTE — Telephone Encounter (Signed)
Pt called- states she has an appointment with Dr. Ladona Ridgel on Monday but noticed her right breast is red. She is out of town so unable to come in to clinic today. She is going to attempt to send a photo through [employer]. Please advise: CB# [phone] or [phone]

## 2022-07-24 NOTE — Telephone Encounter (Signed)
Spoke with patient. Will make Dr. Ladona Ridgel aware. Prescribed doxycyline.  She will call if she develops any new or worsening symptoms over the weekend.

## 2022-07-24 NOTE — Telephone Encounter (Signed)
Patient is a pleasant 57 year old female with PMH of right-sided breast cancer and radiation s/p unilateral mastectomy with reconstruction and implant exchange performed 05/05/2022 by Dr. Ladona Ridgel who called into the office with concern about infection.  She is currently down in [place] with her husband.  She states that yesterday she noticed that her breast was getting mildly red and described an itching sensation.  Pain is minimal, but she feels as though she is may be getting sick and is concerned for infection.  She states that there appears to be some skin breakdown along her incision, as well.  Reviewed photo provided by patient and there appears to be erythema along the incision as long as well has the appearance of incisional wounds.  This patient had particularly thin flaps at time of her implant exchange and her skin has been damaged from radiation.  There had been discussion about possible LAT dorsi reconstruction if there were to be postoperative complications.  She flies back to [facility] and already has a in person visit with Dr. Ladona Ridgel plan for Monday.  Will prescribe doxycycline to help cover for infection.  Asked that she call the office should she develop any new or worsening symptoms in interim.  Dr. Ladona Ridgel made aware of patient.

## 2022-07-27 ENCOUNTER — Ambulatory Visit (HOSPITAL_BASED_OUTPATIENT_CLINIC_OR_DEPARTMENT_OTHER): Payer: Managed Care, Other (non HMO) | Admitting: Certified Registered"

## 2022-07-27 ENCOUNTER — Ambulatory Visit (HOSPITAL_COMMUNITY): Payer: Managed Care, Other (non HMO) | Admitting: Certified Registered"

## 2022-07-27 ENCOUNTER — Ambulatory Visit (HOSPITAL_COMMUNITY)
Admission: RE | Admit: 2022-07-27 | Discharge: 2022-07-27 | Disposition: A | Discharge status: Home / Self Care | Payer: Managed Care, Other (non HMO) | Attending: Plastic Surgery | Admitting: Plastic Surgery

## 2022-07-27 ENCOUNTER — Encounter (HOSPITAL_COMMUNITY)
Admission: RE | Disposition: A | Discharge status: Home / Self Care | Payer: Self-pay | Source: Home / Self Care | Attending: Plastic Surgery

## 2022-07-27 ENCOUNTER — Ambulatory Visit (INDEPENDENT_AMBULATORY_CARE_PROVIDER_SITE_OTHER): Payer: Managed Care, Other (non HMO) | Admitting: Physician Assistant

## 2022-07-27 ENCOUNTER — Other Ambulatory Visit: Payer: Self-pay

## 2022-07-27 ENCOUNTER — Encounter: Payer: Self-pay | Admitting: Physician Assistant

## 2022-07-27 VITALS — BP 121/78 | HR 86

## 2022-07-27 DIAGNOSIS — T8132XA Disruption of internal operation (surgical) wound, not elsewhere classified, initial encounter: Secondary | ICD-10-CM | POA: Diagnosis not present

## 2022-07-27 DIAGNOSIS — Z853 Personal history of malignant neoplasm of breast: Secondary | ICD-10-CM | POA: Diagnosis not present

## 2022-07-27 DIAGNOSIS — C50911 Malignant neoplasm of unspecified site of right female breast: Secondary | ICD-10-CM | POA: Diagnosis not present

## 2022-07-27 DIAGNOSIS — Z45811 Encounter for adjustment or removal of right breast implant: Secondary | ICD-10-CM

## 2022-07-27 DIAGNOSIS — K219 Gastro-esophageal reflux disease without esophagitis: Secondary | ICD-10-CM | POA: Insufficient documentation

## 2022-07-27 DIAGNOSIS — Z923 Personal history of irradiation: Secondary | ICD-10-CM | POA: Diagnosis not present

## 2022-07-27 DIAGNOSIS — E119 Type 2 diabetes mellitus without complications: Secondary | ICD-10-CM | POA: Insufficient documentation

## 2022-07-27 DIAGNOSIS — Z7984 Long term (current) use of oral hypoglycemic drugs: Secondary | ICD-10-CM | POA: Diagnosis not present

## 2022-07-27 DIAGNOSIS — Z08 Encounter for follow-up examination after completed treatment for malignant neoplasm: Secondary | ICD-10-CM | POA: Diagnosis not present

## 2022-07-27 DIAGNOSIS — E785 Hyperlipidemia, unspecified: Secondary | ICD-10-CM | POA: Insufficient documentation

## 2022-07-27 DIAGNOSIS — Z9889 Other specified postprocedural states: Secondary | ICD-10-CM

## 2022-07-27 DIAGNOSIS — Z9882 Breast implant status: Secondary | ICD-10-CM | POA: Diagnosis not present

## 2022-07-27 DIAGNOSIS — F909 Attention-deficit hyperactivity disorder, unspecified type: Secondary | ICD-10-CM | POA: Diagnosis not present

## 2022-07-27 DIAGNOSIS — L308 Other specified dermatitis: Secondary | ICD-10-CM | POA: Diagnosis present

## 2022-07-27 DIAGNOSIS — Z9011 Acquired absence of right breast and nipple: Secondary | ICD-10-CM | POA: Insufficient documentation

## 2022-07-27 DIAGNOSIS — T8131XA Disruption of external operation (surgical) wound, not elsewhere classified, initial encounter: Secondary | ICD-10-CM | POA: Diagnosis not present

## 2022-07-27 HISTORY — PX: BREAST IMPLANT REMOVAL: SHX5361

## 2022-07-27 LAB — AEROBIC/ANAEROBIC CULTURE W GRAM STAIN (SURGICAL/DEEP WOUND): Gram Stain: NONE SEEN

## 2022-07-27 LAB — BASIC METABOLIC PANEL
Anion gap: 10 (ref 5–15)
BUN: 12 mg/dL (ref 6–20)
CO2: 23 mmol/L (ref 22–32)
Calcium: 9.5 mg/dL (ref 8.9–10.3)
Chloride: 105 mmol/L (ref 98–111)
Creatinine, Ser: 0.82 mg/dL (ref 0.44–1.00)
GFR, Estimated: >60 mL/min (ref >60)
Glucose, Bld: 101 mg/dL — ABNORMAL HIGH (ref 70–99)
Potassium: 3.8 mmol/L (ref 3.5–5.1)
Sodium: 138 mmol/L (ref 135–145)

## 2022-07-27 LAB — CBC
HCT: 36.9 % (ref 36.0–46.0)
Hemoglobin: 12.4 g/dL (ref 12.0–15.0)
MCH: 31 pg (ref 26.0–34.0)
MCHC: 33.6 g/dL (ref 30.0–36.0)
MCV: 92.3 fL (ref 80.0–100.0)
Platelets: 241 10*3/uL (ref 150–400)
RBC: 4 MIL/uL (ref 3.87–5.11)
RDW: 13.6 % (ref 11.5–15.5)
WBC: 6.6 10*3/uL (ref 4.0–10.5)
nRBC: 0 % (ref 0.0–0.2)

## 2022-07-27 LAB — GLUCOSE, CAPILLARY
Glucose-Capillary: 96 mg/dL (ref 70–99)
Glucose-Capillary: 89 mg/dL (ref 70–99)

## 2022-07-27 SURGERY — REMOVAL, IMPLANT, BREAST
Anesthesia: General | Site: Breast | Laterality: Right

## 2022-07-27 MED ORDER — PROPOFOL 10 MG/ML IV BOLUS
INTRAVENOUS | Status: DC | PRN
  Administered 2022-07-27: 150 mg via INTRAVENOUS

## 2022-07-27 MED ORDER — PROPOFOL 10 MG/ML IV BOLUS
INTRAVENOUS | Status: AC
  Filled 2022-07-27: qty 20

## 2022-07-27 MED ORDER — FENTANYL CITRATE (PF) 250 MCG/5ML IJ SOLN
INTRAMUSCULAR | Status: AC
  Filled 2022-07-27: qty 5

## 2022-07-27 MED ORDER — ONDANSETRON HCL 4 MG/2ML IJ SOLN
INTRAMUSCULAR | Status: DC | PRN
  Administered 2022-07-27: 4 mg via INTRAVENOUS

## 2022-07-27 MED ORDER — CEFAZOLIN SODIUM-DEXTROSE 2-4 GM/100ML-% IV SOLN
INTRAVENOUS | Status: AC
  Filled 2022-07-27: qty 100

## 2022-07-27 MED ORDER — CHLORHEXIDINE GLUCONATE CLOTH 2 % EX PADS: 6.0000 | MEDICATED_PAD | Freq: Once | CUTANEOUS | Status: DC

## 2022-07-27 MED ORDER — OXYCODONE HCL 5 MG PO TABS
ORAL_TABLET | ORAL | Status: AC
  Filled 2022-07-27: qty 1

## 2022-07-27 MED ORDER — CHLORHEXIDINE GLUCONATE 0.12 % MT SOLN
OROMUCOSAL | Status: AC
  Administered 2022-07-27: 15 mL via OROMUCOSAL
  Filled 2022-07-27: qty 15

## 2022-07-27 MED ORDER — FENTANYL CITRATE (PF) 250 MCG/5ML IJ SOLN
INTRAMUSCULAR | Status: DC | PRN
  Administered 2022-07-27 (×3): 50 ug via INTRAVENOUS

## 2022-07-27 MED ORDER — OXYCODONE HCL 5 MG PO TABS: 5.0000 mg | ORAL_TABLET | Freq: Three times a day (TID) | ORAL | 0 refills | Status: AC | PRN

## 2022-07-27 MED ORDER — CEFAZOLIN SODIUM-DEXTROSE 2-4 GM/100ML-% IV SOLN
2.0000 g | INTRAVENOUS | Status: AC
  Administered 2022-07-27: 2 g via INTRAVENOUS

## 2022-07-27 MED ORDER — FENTANYL CITRATE (PF) 100 MCG/2ML IJ SOLN
25.0000 ug | INTRAMUSCULAR | Status: DC | PRN
  Administered 2022-07-27: 50 ug via INTRAVENOUS
  Administered 2022-07-27: 25 ug via INTRAVENOUS

## 2022-07-27 MED ORDER — MIDAZOLAM HCL 2 MG/2ML IJ SOLN
INTRAMUSCULAR | Status: DC | PRN
  Administered 2022-07-27: 2 mg via INTRAVENOUS

## 2022-07-27 MED ORDER — OXYCODONE HCL 5 MG PO TABS
5.0000 mg | ORAL_TABLET | Freq: Once | ORAL | Status: AC | PRN
  Administered 2022-07-27: 5 mg via ORAL

## 2022-07-27 MED ORDER — ONDANSETRON 4 MG PO TBDP: 4.0000 mg | ORAL_TABLET | Freq: Three times a day (TID) | ORAL | 0 refills | Status: DC | PRN

## 2022-07-27 MED ORDER — DEXAMETHASONE SODIUM PHOSPHATE 10 MG/ML IJ SOLN
INTRAMUSCULAR | Status: DC | PRN
  Administered 2022-07-27: 10 mg via INTRAVENOUS

## 2022-07-27 MED ORDER — CHLORHEXIDINE GLUCONATE 0.12 % MT SOLN: 15.0000 mL | Freq: Once | OROMUCOSAL | Status: AC

## 2022-07-27 MED ORDER — FENTANYL CITRATE (PF) 100 MCG/2ML IJ SOLN
INTRAMUSCULAR | Status: AC
  Filled 2022-07-27: qty 2

## 2022-07-27 MED ORDER — LIDOCAINE 2% (20 MG/ML) 5 ML SYRINGE
INTRAMUSCULAR | Status: DC | PRN
  Administered 2022-07-27: 60 mg via INTRAVENOUS

## 2022-07-27 MED ORDER — LACTATED RINGERS IV SOLN: INTRAVENOUS | Status: DC

## 2022-07-27 MED ORDER — 0.9 % SODIUM CHLORIDE (POUR BTL) OPTIME
TOPICAL | Status: DC | PRN
  Administered 2022-07-27: 1000 mL

## 2022-07-27 MED ORDER — OXYCODONE HCL 5 MG/5ML PO SOLN: 5.0000 mg | Freq: Once | ORAL | Status: AC | PRN

## 2022-07-27 MED ORDER — MIDAZOLAM HCL 2 MG/2ML IJ SOLN
INTRAMUSCULAR | Status: AC
  Filled 2022-07-27: qty 2

## 2022-07-27 MED ORDER — ACETAMINOPHEN 500 MG PO TABS
1000.0000 mg | ORAL_TABLET | Freq: Once | ORAL | Status: AC
  Administered 2022-07-27: 1000 mg via ORAL
  Filled 2022-07-27: qty 2

## 2022-07-27 MED ORDER — EPHEDRINE SULFATE-NACL 50-0.9 MG/10ML-% IV SOSY
PREFILLED_SYRINGE | INTRAVENOUS | Status: DC | PRN
  Administered 2022-07-27 (×3): 5 mg via INTRAVENOUS

## 2022-07-27 MED ORDER — ORAL CARE MOUTH RINSE: 15.0000 mL | Freq: Once | OROMUCOSAL | Status: AC

## 2022-07-27 MED ORDER — AMISULPRIDE (ANTIEMETIC) 5 MG/2ML IV SOLN: 10.0000 mg | Freq: Once | INTRAVENOUS | Status: DC | PRN

## 2022-07-27 MED ORDER — PHENYLEPHRINE 80 MCG/ML (10ML) SYRINGE FOR IV PUSH (FOR BLOOD PRESSURE SUPPORT)
PREFILLED_SYRINGE | INTRAVENOUS | Status: DC | PRN
  Administered 2022-07-27 (×6): 160 ug via INTRAVENOUS

## 2022-07-27 MED ORDER — PROPOFOL 500 MG/50ML IV EMUL
INTRAVENOUS | Status: DC | PRN
  Administered 2022-07-27: 25 ug/kg/min via INTRAVENOUS

## 2022-07-27 MED ORDER — CELECOXIB 200 MG PO CAPS
200.0000 mg | ORAL_CAPSULE | Freq: Once | ORAL | Status: AC
  Administered 2022-07-27: 200 mg via ORAL
  Filled 2022-07-27: qty 1

## 2022-07-27 SURGICAL SUPPLY — 39 items
BAG COUNTER SPONGE SURGICOUNT (BAG) ×1 IMPLANT
BINDER BREAST LRG (GAUZE/BANDAGES/DRESSINGS) IMPLANT
BIOPATCH RED 1 DISK 7.0 (GAUZE/BANDAGES/DRESSINGS) IMPLANT
CANISTER SUCT 3000ML PPV (MISCELLANEOUS) ×1 IMPLANT
COVER SURGICAL LIGHT HANDLE (MISCELLANEOUS) ×1 IMPLANT
DRAIN CHANNEL 19F RND (DRAIN) IMPLANT
DRSG MEPILEX POST OP 4X8 (GAUZE/BANDAGES/DRESSINGS) IMPLANT
DRSG TEGADERM 4X4.75 (GAUZE/BANDAGES/DRESSINGS) IMPLANT
ELECT REM PT RETURN 9FT ADLT (ELECTROSURGICAL) ×1
ELECTRODE REM PT RTRN 9FT ADLT (ELECTROSURGICAL) ×1 IMPLANT
EVACUATOR SILICONE 100CC (DRAIN) IMPLANT
GAUZE PAD ABD 8X10 STRL (GAUZE/BANDAGES/DRESSINGS) IMPLANT
GAUZE SPONGE 2X2 STRL 8-PLY (GAUZE/BANDAGES/DRESSINGS) IMPLANT
GAUZE SPONGE 4X4 12PLY STRL (GAUZE/BANDAGES/DRESSINGS) IMPLANT
GLOVE BIO SURGEON STRL SZ 6.5 (GLOVE) ×3 IMPLANT
GLOVE BIOGEL M STRL SZ7.5 (GLOVE) IMPLANT
GLOVE INDICATOR 8.0 STRL GRN (GLOVE) IMPLANT
GOWN STRL REUS W/ TWL LRG LVL3 (GOWN DISPOSABLE) ×3 IMPLANT
GOWN STRL REUS W/TWL LRG LVL3 (GOWN DISPOSABLE) ×3
KIT BASIN OR (CUSTOM PROCEDURE TRAY) ×1 IMPLANT
KIT TURNOVER KIT B (KITS) ×1 IMPLANT
MARKER SKIN DUAL TIP RULER LAB (MISCELLANEOUS) ×1 IMPLANT
NDL HYPO 25GX1X1/2 BEV (NEEDLE) ×1 IMPLANT
NEEDLE HYPO 25GX1X1/2 BEV (NEEDLE) IMPLANT
NS IRRIG 1000ML POUR BTL (IV SOLUTION) ×1 IMPLANT
PACK GENERAL/GYN (CUSTOM PROCEDURE TRAY) ×1 IMPLANT
PACK UNIVERSAL I (CUSTOM PROCEDURE TRAY) IMPLANT
PAD ARMBOARD 7.5X6 YLW CONV (MISCELLANEOUS) ×2 IMPLANT
SOL PREP POV-IOD 4OZ 10% (MISCELLANEOUS) ×1 IMPLANT
SUT ETHILON 2 0 FS 18 (SUTURE) IMPLANT
SUT MNCRL AB 3-0 PS2 18 (SUTURE) IMPLANT
SUT MNCRL AB 4-0 PS2 18 (SUTURE) ×4 IMPLANT
SUT PROLENE 3 0 PS 2 (SUTURE) IMPLANT
SUT PROLENE 4 0 SH DA (SUTURE) IMPLANT
SUT SILK 2 0 SH (SUTURE) IMPLANT
SUT VIC AB 3-0 SH 18 (SUTURE) ×2 IMPLANT
TOWEL GREEN STERILE (TOWEL DISPOSABLE) ×1 IMPLANT
TOWEL GREEN STERILE FF (TOWEL DISPOSABLE) ×1 IMPLANT
WATER STERILE IRR 1000ML POUR (IV SOLUTION) IMPLANT

## 2022-07-27 NOTE — Anesthesia Preprocedure Evaluation (Addendum)
Anesthesia Evaluation  Patient identified by MRN, date of birth, ID band Patient awake    Reviewed: Allergy & Precautions, NPO status , Patient's Chart, lab work & pertinent test results  History of Anesthesia Complications Negative for: history of anesthetic complications  Airway Mallampati: II  TM Distance: >3 FB Neck ROM: Full    Dental  (+) Dental Advisory Given, Teeth Intact   Pulmonary neg pulmonary ROS   Pulmonary exam normal        Cardiovascular negative cardio ROS Normal cardiovascular exam     Neuro/Psych  Headaches PSYCHIATRIC DISORDERS Anxiety Depression       GI/Hepatic Neg liver ROS,GERD  Controlled,,  Endo/Other  diabetes, Type 2, Oral Hypoglycemic Agents    Renal/GU negative Renal ROS     Musculoskeletal negative musculoskeletal ROS (+)    Abdominal   Peds  (+) ATTENTION DEFICIT DISORDER WITHOUT HYPERACTIVITY Hematology negative hematology ROS (+)   Anesthesia Other Findings   Reproductive/Obstetrics  Breast cancer                              Anesthesia Physical Anesthesia Plan  ASA: 2  Anesthesia Plan: General   Post-op Pain Management: Tylenol PO (pre-op)* and Celebrex PO (pre-op)*   Induction: Intravenous  PONV Risk Score and Plan: 3 and Treatment may vary due to age or medical condition, Ondansetron, Dexamethasone and Midazolam  Airway Management Planned: LMA  Additional Equipment: None  Intra-op Plan:   Post-operative Plan: Extubation in OR  Informed Consent: I have reviewed the patients History and Physical, chart, labs and discussed the procedure including the risks, benefits and alternatives for the proposed anesthesia with the patient or authorized representative who has indicated his/her understanding and acceptance.     Dental advisory given  Plan Discussed with: CRNA and Anesthesiologist  Anesthesia Plan Comments:          Anesthesia Quick Evaluation

## 2022-07-27 NOTE — H&P (View-Only) (Signed)
Patient ID: Robin Farley, female    DOB: [birth year], 57 y.o.   MRN: [id number]  Chief Complaint  Patient presents with   Pre-op Exam      ICD-10-CM   1. History of breast reconstruction  Z98.890        History of Present Illness: Robin Farley is a 57 y.o.  female  with a history of right-sided breast cancer s/p runilateral mastectomy and radiation with reconstruction implants being performed 05/05/2022.  She presents for preoperative evaluation for upcoming procedure, removal of right breast implant with washout and drain placement, scheduled for 07/27/2022 with Dr.  Ladona Ridgel .  The patient has not had problems with anesthesia.  She does not take any prescription medications regularly except for the recently prescribed doxycycline which she held today given she is n.p.o. and it makes her nauseated on upset stomach.  She is holding her supplements and vitamins until after surgery.  She denies any significant cardiac or pulmonary disease.  Denies any personal or family history of blood clots or clotting disorder.  She has not previously required anticoagulation.  Denies any varicosities.  Discussed plan for surgery today as well as expectations postoperatively including future consultation about salvage reconstructive options.  Patient is accompanied by her husband at bedside who will assist with her postoperative recovery.  She states that she felt febrile yesterday, but is better today.  Concern for infected right breast.  Summary of Previous Visit: She was last seen here in clinic on 07/08/2022.  At that time, her incision had still not fully healed and was likely attributable to the history of radiation.  Plan was to abstain from left-sided mastopexy for symmetry until she has completely healed on the right side.  However, she then called into the office on 07/24/2022 stating that she developed redness around the mastectomy incision as well as concerning incisional wounds.  Discussed likely  plan to go to the OR once she is back in town from [place].  Doxycycline was prescribed to help cover for possible infection.  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  Understands restrictions, specifically with the prescribed narcotics.  PMH Significant for: Right-sided breast cancer s/p radiation and reconstruction, HLD, GERD.   Past Medical History: Allergies: Allergies  Allergen Reactions   Promethazine Hcl     Legs twitch    Current Medications:  Current Outpatient Medications:    amphetamine-dextroamphetamine (ADDERALL) 20 MG tablet, Take 1.5 tabs by mouth twice per day, Disp: 90 tablet, Rfl: 0   atorvastatin (LIPITOR) 20 MG tablet, Take 1 tablet (20 mg total) by mouth daily., Disp: 90 tablet, Rfl: 3   doxycycline (VIBRA-TABS) 100 MG tablet, Take 1 tablet (100 mg total) by mouth 2 (two) times daily for 7 days., Disp: 14 tablet, Rfl: 0   melatonin 5 MG TABS, Take 5 mg by mouth at bedtime as needed (sleep)., Disp: , Rfl:    metFORMIN (GLUCOPHAGE-XR) 500 MG 24 hr tablet, Take 1 tablet (500 mg total) by mouth daily with breakfast., Disp: 90 tablet, Rfl: 3   ondansetron (ZOFRAN-ODT) 4 MG disintegrating tablet, Take 1 tablet (4 mg total) by mouth every 8 (eight) hours as needed for nausea or vomiting., Disp: 20 tablet, Rfl: 0   oxyCODONE (ROXICODONE) 5 MG immediate release tablet, Take 1 tablet (5 mg total) by mouth every 8 (eight) hours as needed for up to 7 days for severe pain., Disp: 20 tablet, Rfl:  0   TURMERIC CURCUMIN PO, Take 750 mg by mouth daily., Disp: , Rfl:   Past Medical Problems: Past Medical History:  Diagnosis Date   Abdominal pain, generalized 12/27/2008   Acute sinusitis, unspecified 02/26/2007   ADD 03/13/2010   ALLERGIC RHINITIS 02/23/2007   ANEMIA 02/05/2007   Anxiety 11/17/2011   ATTENTION DEFICIT DISORDER, HX OF 02/05/2007   Breast cancer    Deviated nasal septum 02/05/2007   Diabetes  mellitus without complication    Family history of breast cancer 11/20/2020   Family history of melanoma 11/20/2020   FATIGUE 02/23/2007   HYPERLIPIDEMIA 02/23/2007   Personal history of malignant melanoma 11/20/2020   Port-A-Cath in place 12/03/2020   SWELLING MASS OR LUMP IN HEAD AND NECK 07/27/2008   UTI 02/23/2007    Past Surgical History: Past Surgical History:  Procedure Laterality Date   BREAST RECONSTRUCTION WITH PLACEMENT OF TISSUE EXPANDER AND FLEX HD (ACELLULAR HYDRATED DERMIS) Right 05/08/2021   Procedure: BREAST RECONSTRUCTION WITH PLACEMENT OF TISSUE EXPANDER AND FLEX HD (ACELLULAR HYDRATED DERMIS);  Surgeon: Pace, Collier S, MD;  Location: [facility];  Service: Plastics;  Laterality: Right;   BROW LIFT     CESAREAN SECTION     COSMETIC SURGERY  1995   Nasal   MASTECTOMY W/ SENTINEL NODE BIOPSY Right 05/08/2021   Procedure: RIGHT MASTECTOMY WITH SENTINEL LYMPH NODE BIOPSY;  Surgeon: Tsuei, Matthew, MD;  Location: [facility];  Service: General;  Laterality: Right;   MELANOMA EXCISION WITH SENTINEL LYMPH NODE BIOPSY Left 10/24/2012   Procedure: MELANOMA wide EXCISION left lateral thigh WITH SENTINEL LYMPH NODE BIOPSY left groin;  Surgeon: Benjamin T Hoxworth, MD;  Location: [facility];  Service: General;  Laterality: Left;   NASAL SEPTUM SURGERY     PORTACATH PLACEMENT Right 12/02/2020   Procedure: INSERTION PORT-A-CATH;  Surgeon: Tsuei, Matthew, MD;  Location: [facility];  Service: General;  Laterality: Right;   RADIOACTIVE SEED GUIDED AXILLARY SENTINEL LYMPH NODE Right 05/08/2021   Procedure: RADIOACTIVE SEED GUIDED AXILLARY SENTINEL LYMPH NODE DISSECTION;  Surgeon: Tsuei, Matthew, MD;  Location:  SUR[facility]ERY C[facility];  Service: General;  Laterality: Right;   REMOVAL OF TISSUE EXPANDER AND PLACEMENT OF IMPLANT Right 05/05/2022   Procedure: REMOVAL OF TISSUE EXPANDER AND PLACEMENT OF IMPLANT;  Surgeon: Taylor,  Jackson B, MD;  Location:  SURGERY CENTER;  Service: Plastics;  Laterality: Right;    Social History: Social History   Socioeconomic History   Marital status: Married    Spouse name: Jeffery   Number of children: 2   Years of education: Not on file   Highest education level: Not on file  Occupational History   Occupation: master's in fine arts [employer]    Employer: [employer]  Tobacco Use   Smoking status: Never   Smokeless tobacco: Never  Vaping Use   Vaping Use: Never used  Substance and Sexual Activity   Alcohol use: Not Currently    Comment: Very rare occasions   Drug use: Never   Sexual activity: Yes    Birth control/protection: None, Post-menopausal  Other Topics Concern   Not on file  Social History Narrative   Not on file   Social Determinants of Health   Financial Resource Strain: Low Risk  (11/20/2020)   Overall Financial Resource Strain (CARDIA)    Difficulty of Paying Living Expenses: Not hard at all  Food Insecurity: No Food Insecurity (11/20/2020)   Hunger Vital Sign      Worried About Programme researcher, broadcasting/film/video in the Last Year: Never true    Ran Out of Food in the Last Year: Never true  Transportation Needs: No Transportation Needs (11/20/2020)   PRAPARE - Administrator, Civil Service (Medical): No    Lack of Transportation (Non-Medical): No  Physical Activity: Not on file  Stress: Not on file  Social Connections: Not on file  Intimate Partner Violence: Not on file    Family History: Family History  Problem Relation Age of Onset   Hypertension Mother    Breast cancer Mother 87   COPD Father    Atrial fibrillation Father    Atrial fibrillation Sister    Kidney cancer Maternal Aunt        dx 69s   Leukemia Paternal Aunt        dx after 29   Cancer Paternal Aunt        unknown type; dx after 68   Brain cancer Paternal Grandfather 74       brain tumor per pt   Melanoma Cousin        arm; no mets per pt   Alcohol abuse  Other    Depression Other    Glaucoma Other    Breast cancer Other        MGM's sisters, x3, dx after 25   Colon polyps Neg Hx     Review of Systems: ROS Denies any recent chest pain, difficulty breathing, leg swelling.  Physical Exam: Vital Signs BP 121/78 (BP Location: Left Arm, Patient Position: Sitting, Cuff Size: Small)   Pulse 86   LMP 04/27/2016   SpO2 98%   Physical Exam Constitutional:      General: Not in acute distress.    Appearance: Normal appearance. Not ill-appearing.  HENT:     Head: Normocephalic and atraumatic.  Eyes:     Pupils: Pupils are equal, round. Cardiovascular:     Rate and Rhythm: Normal rate.    Pulses: Normal pulses.  Pulmonary:     Effort: No respiratory distress or increased work of breathing.  Speaks in full sentences. Abdominal:     General: Abdomen is flat. No distension.   Musculoskeletal: Normal range of motion. No lower extremity swelling or edema. No varicosities. Skin:    General: Skin is warm and dry.     Findings: No erythema or rash.  Neurological:     Mental Status: Alert and oriented to person, place, and time.  Psychiatric:        Mood and Affect: Mood normal.        Behavior: Behavior normal.    Assessment/Plan: The patient is scheduled for removal of right breast implant with washout and drain placement with Dr.  Ladona Ridgel .  Risks, benefits, and alternatives of procedure discussed, questions answered and consent obtained.    Smoking Status: Non-smoker. Last Mammogram: 04/11/2021; Results: BI-RADS Category 4: Biopsy-proven known right breast malignancy.  Patient is due for mammogram.  Caprini Score: 6; Risk Factors include: Age, BMI greater than 25, history of breast cancer, and length of planned surgery. Recommendation for mechanical prophylaxis. Encourage early ambulation.   Pictures obtained: 07/08/2022.  Post-op Rx sent to pharmacy: Oxycodone and Zofran.  Patient was provided with the General Surgical Risk  consent document and Pain Medication Agreement prior to their appointment.  They had adequate time to read through the risk consent documents and Pain Medication Agreement. We also discussed them in person together during this preop appointment.  All of their questions were answered to their satisfaction.  Recommended calling if they have any further questions.  Risk consent form and Pain Medication Agreement to be scanned into patient's chart.    Electronically signed by: Evelena Leyden, PA-C 07/27/2022 10:32 AM

## 2022-07-27 NOTE — Anesthesia Procedure Notes (Signed)
Procedure Name: LMA Insertion Date/Time: 07/27/2022 12:54 PM  Performed by: Alease Medina, CRNAPre-anesthesia Checklist: Patient identified, Emergency Drugs available, Suction available and Patient being monitored Patient Re-evaluated:Patient Re-evaluated prior to induction Oxygen Delivery Method: Circle system utilized Preoxygenation: Pre-oxygenation with 100% oxygen Induction Type: IV induction Ventilation: Mask ventilation without difficulty LMA: LMA inserted LMA Size: 4.0 Number of attempts: 1 Airway Equipment and Method: Stylet and Oral airway Placement Confirmation: positive ETCO2, breath sounds checked- equal and bilateral and CO2 detector Tube secured with: Tape Dental Injury: Teeth and Oropharynx as per pre-operative assessment

## 2022-07-27 NOTE — Progress Notes (Signed)
Patient ID: [name], female    DOB: [birth year], 57 y.o.   MRN: [id number]  Chief Complaint  Patient presents with   Pre-op Exam      ICD-10-CM   1. History of breast reconstruction  Z98.890        History of Present Illness: Robin Farley is a 57 y.o.  female  with a history of right-sided breast cancer s/p runilateral mastectomy and radiation with reconstruction implants being performed 05/05/2022.  She presents for preoperative evaluation for upcoming procedure, removal of right breast implant with washout and drain placement, scheduled for 07/27/2022 with Dr.  Ladona Ridgel .  The patient has not had problems with anesthesia.  She does not take any prescription medications regularly except for the recently prescribed doxycycline which she held today given she is n.p.o. and it makes her nauseated on upset stomach.  She is holding her supplements and vitamins until after surgery.  She denies any significant cardiac or pulmonary disease.  Denies any personal or family history of blood clots or clotting disorder.  She has not previously required anticoagulation.  Denies any varicosities.  Discussed plan for surgery today as well as expectations postoperatively including future consultation about salvage reconstructive options.  Patient is accompanied by her husband at bedside who will assist with her postoperative recovery.  She states that she felt febrile yesterday, but is better today.  Concern for infected right breast.  Summary of Previous Visit: She was last seen here in clinic on 07/08/2022.  At that time, her incision had still not fully healed and was likely attributable to the history of radiation.  Plan was to abstain from left-sided mastopexy for symmetry until she has completely healed on the right side.  However, she then called into the office on 07/24/2022 stating that she developed redness around the mastectomy incision as well as concerning incisional wounds.  Discussed likely  plan to go to the OR once she is back in town from [place].  Doxycycline was prescribed to help cover for possible infection.  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  Understands restrictions, specifically with the prescribed narcotics.  PMH Significant for: Right-sided breast cancer s/p radiation and reconstruction, HLD, GERD.   Past Medical History: Allergies: Allergies  Allergen Reactions   Promethazine Hcl     Legs twitch    Current Medications:  Current Outpatient Medications:    amphetamine-dextroamphetamine (ADDERALL) 20 MG tablet, Take 1.5 tabs by mouth twice per day, Disp: 90 tablet, Rfl: 0   atorvastatin (LIPITOR) 20 MG tablet, Take 1 tablet (20 mg total) by mouth daily., Disp: 90 tablet, Rfl: 3   doxycycline (VIBRA-TABS) 100 MG tablet, Take 1 tablet (100 mg total) by mouth 2 (two) times daily for 7 days., Disp: 14 tablet, Rfl: 0   melatonin 5 MG TABS, Take 5 mg by mouth at bedtime as needed (sleep)., Disp: , Rfl:    metFORMIN (GLUCOPHAGE-XR) 500 MG 24 hr tablet, Take 1 tablet (500 mg total) by mouth daily with breakfast., Disp: 90 tablet, Rfl: 3   ondansetron (ZOFRAN-ODT) 4 MG disintegrating tablet, Take 1 tablet (4 mg total) by mouth every 8 (eight) hours as needed for nausea or vomiting., Disp: 20 tablet, Rfl: 0   oxyCODONE (ROXICODONE) 5 MG immediate release tablet, Take 1 tablet (5 mg total) by mouth every 8 (eight) hours as needed for up to 7 days for severe pain., Disp: 20 tablet, Rfl:  0   TURMERIC CURCUMIN PO, Take 750 mg by mouth daily., Disp: , Rfl:   Past Medical Problems: Past Medical History:  Diagnosis Date   Abdominal pain, generalized 12/27/2008   Acute sinusitis, unspecified 02/26/2007   ADD 03/13/2010   ALLERGIC RHINITIS 02/23/2007   ANEMIA 02/05/2007   Anxiety 11/17/2011   ATTENTION DEFICIT DISORDER, HX OF 02/05/2007   Breast cancer    Deviated nasal septum 02/05/2007   Diabetes  mellitus without complication    Family history of breast cancer 11/20/2020   Family history of melanoma 11/20/2020   FATIGUE 02/23/2007   HYPERLIPIDEMIA 02/23/2007   Personal history of malignant melanoma 11/20/2020   Port-A-Cath in place 12/03/2020   SWELLING MASS OR LUMP IN HEAD AND NECK 07/27/2008   UTI 02/23/2007    Past Surgical History: Past Surgical History:  Procedure Laterality Date   BREAST RECONSTRUCTION WITH PLACEMENT OF TISSUE EXPANDER AND FLEX HD (ACELLULAR HYDRATED DERMIS) Right 05/08/2021   Procedure: BREAST RECONSTRUCTION WITH PLACEMENT OF TISSUE EXPANDER AND FLEX HD (ACELLULAR HYDRATED DERMIS);  Surgeon: Allena Napoleon, MD;  Location: [facility];  Service: Plastics;  Laterality: Right;   BROW LIFT     CESAREAN SECTION     COSMETIC SURGERY  1995   Nasal   MASTECTOMY W/ SENTINEL NODE BIOPSY Right 05/08/2021   Procedure: RIGHT MASTECTOMY WITH SENTINEL LYMPH NODE BIOPSY;  Surgeon: Manus Rudd, MD;  Location: [facility];  Service: General;  Laterality: Right;   MELANOMA EXCISION WITH SENTINEL LYMPH NODE BIOPSY Left 10/24/2012   Procedure: MELANOMA wide EXCISION left lateral thigh WITH SENTINEL LYMPH NODE BIOPSY left groin;  Surgeon: Mariella Saa, MD;  Location: [facility];  Service: General;  Laterality: Left;   NASAL SEPTUM SURGERY     PORTACATH PLACEMENT Right 12/02/2020   Procedure: INSERTION PORT-A-CATH;  Surgeon: Manus Rudd, MD;  Location: [facility];  Service: General;  Laterality: Right;   RADIOACTIVE SEED GUIDED AXILLARY SENTINEL LYMPH NODE Right 05/08/2021   Procedure: RADIOACTIVE SEED GUIDED AXILLARY SENTINEL LYMPH NODE DISSECTION;  Surgeon: Manus Rudd, MD;  Location: [facility];  Service: General;  Laterality: Right;   REMOVAL OF TISSUE EXPANDER AND PLACEMENT OF IMPLANT Right 05/05/2022   Procedure: REMOVAL OF TISSUE EXPANDER AND PLACEMENT OF IMPLANT;  Surgeon: Santiago Glad, MD;  Location: [facility];  Service: Plastics;  Laterality: Right;    Social History: Social History   Socioeconomic History   Marital status: Married    Spouse name: Geologist, engineering   Number of children: 2   Years of education: Not on file   Highest education level: Not on file  Occupational History   Occupation: Education administrator in fine arts [employer]    Employer: [employer]  Tobacco Use   Smoking status: Never   Smokeless tobacco: Never  Vaping Use   Vaping Use: Never used  Substance and Sexual Activity   Alcohol use: Not Currently    Comment: Very rare occasions   Drug use: Never   Sexual activity: Yes    Birth control/protection: None, Post-menopausal  Other Topics Concern   Not on file  Social History Narrative   Not on file   Social Determinants of Health   Financial Resource Strain: Low Risk  (11/20/2020)   Overall Financial Resource Strain (CARDIA)    Difficulty of Paying Living Expenses: Not hard at all  Food Insecurity: No Food Insecurity (11/20/2020)   Hunger Vital Sign  Worried About Programme researcher, broadcasting/film/video in the Last Year: Never true    Ran Out of Food in the Last Year: Never true  Transportation Needs: No Transportation Needs (11/20/2020)   PRAPARE - Administrator, Civil Service (Medical): No    Lack of Transportation (Non-Medical): No  Physical Activity: Not on file  Stress: Not on file  Social Connections: Not on file  Intimate Partner Violence: Not on file    Family History: Family History  Problem Relation Age of Onset   Hypertension Mother    Breast cancer Mother 87   COPD Father    Atrial fibrillation Father    Atrial fibrillation Sister    Kidney cancer Maternal Aunt        dx 69s   Leukemia Paternal Aunt        dx after 29   Cancer Paternal Aunt        unknown type; dx after 68   Brain cancer Paternal Grandfather 74       brain tumor per pt   Melanoma Cousin        arm; no mets per pt   Alcohol abuse  Other    Depression Other    Glaucoma Other    Breast cancer Other        MGM's sisters, x3, dx after 25   Colon polyps Neg Hx     Review of Systems: ROS Denies any recent chest pain, difficulty breathing, leg swelling.  Physical Exam: Vital Signs BP 121/78 (BP Location: Left Arm, Patient Position: Sitting, Cuff Size: Small)   Pulse 86   LMP 04/27/2016   SpO2 98%   Physical Exam Constitutional:      General: Not in acute distress.    Appearance: Normal appearance. Not ill-appearing.  HENT:     Head: Normocephalic and atraumatic.  Eyes:     Pupils: Pupils are equal, round. Cardiovascular:     Rate and Rhythm: Normal rate.    Pulses: Normal pulses.  Pulmonary:     Effort: No respiratory distress or increased work of breathing.  Speaks in full sentences. Abdominal:     General: Abdomen is flat. No distension.   Musculoskeletal: Normal range of motion. No lower extremity swelling or edema. No varicosities. Skin:    General: Skin is warm and dry.     Findings: No erythema or rash.  Neurological:     Mental Status: Alert and oriented to person, place, and time.  Psychiatric:        Mood and Affect: Mood normal.        Behavior: Behavior normal.    Assessment/Plan: The patient is scheduled for removal of right breast implant with washout and drain placement with Dr.  Ladona Ridgel .  Risks, benefits, and alternatives of procedure discussed, questions answered and consent obtained.    Smoking Status: Non-smoker. Last Mammogram: 04/11/2021; Results: BI-RADS Category 4: Biopsy-proven known right breast malignancy.  Patient is due for mammogram.  Caprini Score: 6; Risk Factors include: Age, BMI greater than 25, history of breast cancer, and length of planned surgery. Recommendation for mechanical prophylaxis. Encourage early ambulation.   Pictures obtained: 07/08/2022.  Post-op Rx sent to pharmacy: Oxycodone and Zofran.  Patient was provided with the General Surgical Risk  consent document and Pain Medication Agreement prior to their appointment.  They had adequate time to read through the risk consent documents and Pain Medication Agreement. We also discussed them in person together during this preop appointment.  All of their questions were answered to their satisfaction.  Recommended calling if they have any further questions.  Risk consent form and Pain Medication Agreement to be scanned into patient's chart.    Electronically signed by: Evelena Leyden, PA-C 07/27/2022 10:32 AM

## 2022-07-27 NOTE — Interval H&P Note (Signed)
History and Physical Interval Note: Pt seen in pre op, no change in exam or indication for surgery. Surgical site marked with her assistance Will remove her right implant due to wound infection  07/27/2022 11:59 AM  Robin Farley  has presented today for surgery, with the diagnosis of s/p breast reconstruction.  The various methods of treatment have been discussed with the patient and family. After consideration of risks, benefits and other options for treatment, the patient has consented to  Procedure(s): Removal of right breast implant, washout, drain placemen (Right) as a surgical intervention.  The patient's history has been reviewed, patient examined, no change in status, stable for surgery.  I have reviewed the patient's chart and labs.  Questions were answered to the patient's satisfaction.     Santiago Glad

## 2022-07-27 NOTE — Op Note (Signed)
DATE OF OPERATION: 07/27/2022  LOCATION: [facility] Main operating Room  PREOPERATIVE DIAGNOSIS: Right breast soft tissue infection, impending implant explantation  POSTOPERATIVE DIAGNOSIS: Same  PROCEDURE: Removal of right breast implant, wound washout, drain placement  SURGEON: Loren Racer, MD  ASSISTANT: Evelena Leyden, primary, Burna Forts  EBL: 5 cc  CONDITION: Stable  COMPLICATIONS: None  INDICATION: The patient, Robin Farley, is a 56 y.o. female born on 11-28-1965, is here for treatment of her right breast.  The patient underwent a tissue expander to implant exchange several months ago.  She called on Friday to assist tell us that she had separation of her wound with drainage of a thin clear fluid and erythema.  At that time the patient denied having any systemic symptoms.  I recommended to her that she have the breast implant removed and the wound site cultured and a drain placed.  She was started on oral antibiotics.  PROCEDURE DETAILS:  The patient was seen prior to surgery and marked.   IV antibiotics were given. The patient was taken to the operating room and given a general anesthetic. A standard time out was performed and all information was confirmed by those in the room. SCDs were placed.   The right breast was prepped and draped in usual sterile manner.  An elliptical incision was made around the ischemic appearing breast skin.  This tissue was sent for routine pathologic examination.  The wound cavity was cultured.  There was a small amount of serous fluid in the pocket less than 10 cc.  This was irrigated.  Initially I did plan to remove the ADM however the ADM constituted approximately 50% of the thickness of the breast skin.  I do not think that I could remove the ADM without devascularizing all of her breast skin so it was left intact.  The ADM was cauterized with the electrocautery.  The wound was irrigated with 1 L of saline.  A 19 French round drain was placed in the wound bed  and brought out through a separate stab incision.  The dermis was closed with interrupted 3-0 Monocryl sutures.  The skin was closed with a combination of interrupted and running Prolene sutures.  Dressings were placed and the patient was awakened from anesthesia without incident and transferred to the recovery room in stable condition.  All instrument needle and sponge counts were reported as correct and there were no complications. The patient was allowed to wake up and taken to recovery room in stable condition at the end of the case. The family was notified at the end of the case.   The advanced practice practitioner (APP) assisted throughout the case.  The APP was essential in retraction and counter traction when needed to make the case progress smoothly.  This retraction and assistance made it possible to see the tissue plans for the procedure.  The assistance was needed for blood control, tissue re-approximation and assisted with closure of the incision site.

## 2022-07-27 NOTE — Discharge Instructions (Addendum)
Activity: Avoid strenuous activity.  No lifting, pushing, or pulling greater than 15 pounds.  Diet: No restrictions.  Try to optimize nutrition with plenty of proteins, fruits, and vegetables to improve healing.   Wound Care: Leave breast binder on for the first week and then you may transition to a front-clipping or front-zipping compression bra.  Sponge bathe only until our visit, then we can discuss transition to showering with the emphasis on keeping the surgical site dry.  Replace the ABD pads over incision with gauze or Maxi pads as needed for incisional drainage.   If you have drains placed, be sure to record the daily output from each side. They will be removed at your appointment tomorrow. Please make sure that the bulbs are charged.  If you experience any issues, please call the office.    Follow-Up: As scheduled.  Things to watch for:  Call the office if you experience fever, severe swelling or pain, bright red bleeding per bulb, high output volume > 150 cc/day from drain, leg swelling, chest pain, or difficulty breathing.

## 2022-07-27 NOTE — Anesthesia Postprocedure Evaluation (Signed)
Anesthesia Post Note  Patient: Robin Farley  Procedure(s) Performed: Removal of right breast implant, washout, drain placemen (Right: Breast)     Patient location during evaluation: PACU Anesthesia Type: General Level of consciousness: awake and alert Pain management: pain level controlled Vital Signs Assessment: post-procedure vital signs reviewed and stable Respiratory status: spontaneous breathing, nonlabored ventilation and respiratory function stable Cardiovascular status: stable and blood pressure returned to baseline Anesthetic complications: no   No notable events documented.  Last Vitals:  Vitals:   07/27/22 1454 07/27/22 1500  BP:  115/69  Pulse: 81 84  Resp: 16 20  Temp:    SpO2: 96% 95%    Last Pain:  Vitals:   07/27/22 1454  TempSrc:   PainSc: 5                  Beryle Lathe

## 2022-07-27 NOTE — Transfer of Care (Signed)
Immediate Anesthesia Transfer of Care Note  Patient: NYTIA EVITTS  Procedure(s) Performed: Removal of right breast implant, washout, drain placemen (Right: Breast)  Patient Location: PACU  Anesthesia Type:General  Level of Consciousness: drowsy and patient cooperative  Airway & Oxygen Therapy: Patient Spontanous Breathing and Patient connected to nasal cannula oxygen  Post-op Assessment: Report given to RN, Post -op Vital signs reviewed and stable, and Patient moving all extremities X 4  Post vital signs: Reviewed and stable  Last Vitals:  Vitals Value Taken Time  BP 100/43 07/27/22 1356  Temp    Pulse 88 07/27/22 1400  Resp 15 07/27/22 1400  SpO2 100 % 07/27/22 1400  Vitals shown include unvalidated device data.  Last Pain:  Vitals:   07/27/22 1211  TempSrc:   PainSc: 2          Complications: No notable events documented.

## 2022-07-28 ENCOUNTER — Ambulatory Visit (INDEPENDENT_AMBULATORY_CARE_PROVIDER_SITE_OTHER): Payer: Managed Care, Other (non HMO) | Admitting: Physician Assistant

## 2022-07-28 ENCOUNTER — Encounter (HOSPITAL_COMMUNITY): Payer: Self-pay | Admitting: Plastic Surgery

## 2022-07-28 DIAGNOSIS — Z9889 Other specified postprocedural states: Secondary | ICD-10-CM

## 2022-07-28 LAB — SURGICAL PATHOLOGY

## 2022-07-28 NOTE — Progress Notes (Signed)
Patient is a pleasant 57 year old female with PMH of right-sided breast cancer s/p unilateral mastectomy and radiation with implant-based reconstruction performed 05/05/2022 now s/p removal of right breast implant performed 07/27/2022 by Dr. Ladona Ridgel who joins via telephone encounter for day 1 postoperative checkup.  The patient was at home and this provider was calling from their office.  A total of 10 minutes was spent speaking with the patient and reviewing chart.    Today she is doing well.  She has not required any of the narcotic analgesics.  She states that her pain is much improved.  She felt a bit nauseated earlier this morning, but that has since improved with food.  She has been taking the doxycycline, as prescribed.  She is tolerating p.o. intake, voiding.  Ambulatory.  Husband is assisting with recovery.  Reports that her drain output has been minimal.  No specific concerns.  She returns to clinic in person to see Dr. Ladona Ridgel 07/30/2022.  She will call the clinic should she have any questions or concerns in interim.  Will provide letter for her work to give her some time off for postoperative recovery.  Will be sent via MyChart.

## 2022-07-30 ENCOUNTER — Ambulatory Visit (INDEPENDENT_AMBULATORY_CARE_PROVIDER_SITE_OTHER): Payer: Managed Care, Other (non HMO) | Admitting: Plastic Surgery

## 2022-07-30 DIAGNOSIS — Z9889 Other specified postprocedural states: Secondary | ICD-10-CM

## 2022-07-30 DIAGNOSIS — T8130XA Disruption of wound, unspecified, initial encounter: Secondary | ICD-10-CM

## 2022-07-30 NOTE — Progress Notes (Signed)
Robin Farley today 3 days postop after removal of her right breast implant.  Patient had undergone tissue expansion with a prepectoral approach and subsequent radiation therapy. I removed her tissue expander and replaced it with a silicone implant.  She had had wound healing issues from the very beginning but eventually dehisced her incisio and I removed her implant this week.  She states that she is doing well with minimal pain.  She has taken no pain medicine since surgery.  She denies fevers or chills.  She states that she has been having minimal output from her drain.  On physical exam her incision is intact.  There is some central erythema but no evidence of infection.  Drain output is thin and serosanguineous.  Were replaced today.  She is encouraged to change them this weekend or if they become saturated.  She will return on Monday to have her drain removed.

## 2022-07-31 ENCOUNTER — Encounter: Payer: Self-pay | Admitting: Physician Assistant

## 2022-08-01 LAB — AEROBIC/ANAEROBIC CULTURE W GRAM STAIN (SURGICAL/DEEP WOUND)

## 2022-08-03 ENCOUNTER — Encounter: Payer: Self-pay | Admitting: Physician Assistant

## 2022-08-03 ENCOUNTER — Ambulatory Visit (INDEPENDENT_AMBULATORY_CARE_PROVIDER_SITE_OTHER): Payer: Managed Care, Other (non HMO) | Admitting: Physician Assistant

## 2022-08-03 VITALS — BP 129/79 | HR 82

## 2022-08-03 DIAGNOSIS — Z9889 Other specified postprocedural states: Secondary | ICD-10-CM

## 2022-08-03 NOTE — Progress Notes (Signed)
This is a 57 year old female who is status post right-sided breast cancer status post unilateral mastectomy and radiation with implant-based reconstruction on 05/05/2022 who is now status post removal of right breast implant performed by Dr. Ladona Ridgel on 07/27/2022.  The patient was last seen in the office on 07/30/2022.  At that time she had been doing well.  She was instructed follow-up in our office today for drain removal.  She notes since her last office visit she denies any significant complaints, she denies any fever, she notes the drain has put out a few cc over the last 24 hours.  Incision is clean dry and intact with some minimal irritation along the lateral incision, no signs of infection, Prolene's are in place.  Mastectomy flaps are viable.  Drain with approximately 1 cc of serous output, drain site is clean.  The drain was removed without difficulty, the patient tolerated this well.  Overall she is doing well.  Her incisions are clean dry and intact.  I would like to see her back in the office 1 week from today which is 2 weeks from surgery for follow-up evaluation and assessment of sutures.  If her incisions are healing well we will consider taking sutures out at that time.  She will continue with wearing compressive garments.  She was given strict return precautions, she verbalized understanding and agreement to today's plan.

## 2022-08-07 ENCOUNTER — Encounter: Payer: Managed Care, Other (non HMO) | Admitting: Internal Medicine

## 2022-08-10 ENCOUNTER — Ambulatory Visit (INDEPENDENT_AMBULATORY_CARE_PROVIDER_SITE_OTHER): Payer: Managed Care, Other (non HMO) | Admitting: Physician Assistant

## 2022-08-10 ENCOUNTER — Encounter: Payer: Self-pay | Admitting: Physician Assistant

## 2022-08-10 VITALS — BP 136/88 | HR 73

## 2022-08-10 DIAGNOSIS — Z9889 Other specified postprocedural states: Secondary | ICD-10-CM

## 2022-08-10 DIAGNOSIS — N6489 Other specified disorders of breast: Secondary | ICD-10-CM

## 2022-08-10 MED ORDER — DOXYCYCLINE HYCLATE 100 MG PO TABS: 100.0000 mg | ORAL_TABLET | Freq: Two times a day (BID) | ORAL | 0 refills | Status: DC

## 2022-08-10 NOTE — Progress Notes (Signed)
This is a 57 year old female who is status post right-sided breast cancer status post unilateral mastectomy and radiation with implant-based reconstruction on 05/05/2022.  She is now status post removal right breast implant performed by Dr. Ladona Ridgel on 07/27/2022.  The patient was last seen in the office on 08/03/2022.  At that time she had been doing very well.  Given her very little drain output the drain was removed she tolerated this without difficulty.  She notes that since her last office visit she has been doing well.  She did have an area of irritation along the incision line.  She denies any fevers.  On exam there is some fluctuance within the mastectomy flaps, there is a small area of redness along the right lateral mastectomy incision, Prolene stitches are in place, no significant tissue breakdown, no warmth to touch.    Given the collection of fluid I did discuss aspiration with the patient, she agreed to proceed after risks and benefits were discussed.  I was able to get approximately 50 cc of serous fluid off the breast.  The patient also has an area of redness that appears to be getting slightly larger along the incision, this appears more irritation versus cellulitis, but given its persistence and slight worsening to my eye I do feel comfortable starting antibiotics on this patient.  I have sent in prescription of doxycycline.  Given the removal of fluid I also sent this off for culture although my suspicion for infected seroma is very low.  I would like the patient to reach out to our office on Friday if she has reaccumulation of fluid or sooner as needed.  Otherwise I will see her next week in the office on Monday.  She was given strict return precautions.  She verbalized understanding and agreement to today's plan.

## 2022-08-11 ENCOUNTER — Ambulatory Visit (INDEPENDENT_AMBULATORY_CARE_PROVIDER_SITE_OTHER): Payer: Managed Care, Other (non HMO) | Admitting: Internal Medicine

## 2022-08-11 DIAGNOSIS — F988 Other specified behavioral and emotional disorders with onset usually occurring in childhood and adolescence: Secondary | ICD-10-CM | POA: Diagnosis not present

## 2022-08-11 DIAGNOSIS — E538 Deficiency of other specified B group vitamins: Secondary | ICD-10-CM

## 2022-08-11 DIAGNOSIS — E7800 Pure hypercholesterolemia, unspecified: Secondary | ICD-10-CM

## 2022-08-11 DIAGNOSIS — E559 Vitamin D deficiency, unspecified: Secondary | ICD-10-CM

## 2022-08-11 DIAGNOSIS — Z0001 Encounter for general adult medical examination with abnormal findings: Secondary | ICD-10-CM | POA: Diagnosis not present

## 2022-08-11 DIAGNOSIS — R739 Hyperglycemia, unspecified: Secondary | ICD-10-CM

## 2022-08-11 DIAGNOSIS — Z1211 Encounter for screening for malignant neoplasm of colon: Secondary | ICD-10-CM

## 2022-08-11 LAB — URINALYSIS, ROUTINE W REFLEX MICROSCOPIC
Bilirubin Urine: NEGATIVE
Hgb urine dipstick: NEGATIVE
Ketones, ur: NEGATIVE
Leukocytes,Ua: NEGATIVE
Nitrite: NEGATIVE
Specific Gravity, Urine: 1.01 (ref 1.000–1.030)
Total Protein, Urine: NEGATIVE
Urine Glucose: NEGATIVE
Urobilinogen, UA: 0.2 (ref 0.0–1.0)
pH: 8 (ref 5.0–8.0)

## 2022-08-11 LAB — HEPATIC FUNCTION PANEL
ALT: 23 U/L (ref 0–35)
AST: 22 U/L (ref 0–37)
Albumin: 4.2 g/dL (ref 3.5–5.2)
Alkaline Phosphatase: 102 U/L (ref 39–117)
Bilirubin, Direct: 0 mg/dL (ref 0.0–0.3)
Total Bilirubin: 0.4 mg/dL (ref 0.2–1.2)
Total Protein: 7.4 g/dL (ref 6.0–8.3)

## 2022-08-11 LAB — CBC WITH DIFFERENTIAL/PLATELET
Basophils Absolute: 0 10*3/uL (ref 0.0–0.1)
Basophils Relative: 0.7 % (ref 0.0–3.0)
Eosinophils Absolute: 0.3 10*3/uL (ref 0.0–0.7)
Eosinophils Relative: 6.2 % — ABNORMAL HIGH (ref 0.0–5.0)
HCT: 37.5 % (ref 36.0–46.0)
Hemoglobin: 12.7 g/dL (ref 12.0–15.0)
Lymphocytes Relative: 25 % (ref 12.0–46.0)
Lymphs Abs: 1.4 10*3/uL (ref 0.7–4.0)
MCHC: 33.8 g/dL (ref 30.0–36.0)
MCV: 92.3 fl (ref 78.0–100.0)
Monocytes Absolute: 0.6 10*3/uL (ref 0.1–1.0)
Monocytes Relative: 10.6 % (ref 3.0–12.0)
Neutro Abs: 3.2 10*3/uL (ref 1.4–7.7)
Neutrophils Relative %: 57.5 % (ref 43.0–77.0)
Platelets: 242 10*3/uL (ref 150.0–400.0)
RBC: 4.06 Mil/uL (ref 3.87–5.11)
RDW: 14.8 % (ref 11.5–15.5)
WBC: 5.6 10*3/uL (ref 4.0–10.5)

## 2022-08-11 LAB — LIPID PANEL
Cholesterol: 290 mg/dL — ABNORMAL HIGH (ref 0–200)
HDL: 61.5 mg/dL (ref >39.00)
LDL Cholesterol: 194 mg/dL — ABNORMAL HIGH (ref 0–99)
NonHDL: 228.51
Total CHOL/HDL Ratio: 5
Triglycerides: 172 mg/dL — ABNORMAL HIGH (ref 0.0–149.0)
VLDL: 34.4 mg/dL (ref 0.0–40.0)

## 2022-08-11 LAB — BASIC METABOLIC PANEL
BUN: 16 mg/dL (ref 6–23)
CO2: 28 mEq/L (ref 19–32)
Calcium: 9.7 mg/dL (ref 8.4–10.5)
Chloride: 104 mEq/L (ref 96–112)
Creatinine, Ser: 0.82 mg/dL (ref 0.40–1.20)
GFR: 78.5 mL/min (ref >60.00)
Glucose, Bld: 84 mg/dL (ref 70–99)
Potassium: 4.6 mEq/L (ref 3.5–5.1)
Sodium: 140 mEq/L (ref 135–145)

## 2022-08-11 LAB — VITAMIN D 25 HYDROXY (VIT D DEFICIENCY, FRACTURES): VITD: 25.99 ng/mL — ABNORMAL LOW (ref 30.00–100.00)

## 2022-08-11 LAB — HEMOGLOBIN A1C: Hgb A1c MFr Bld: 5.4 % (ref 4.6–6.5)

## 2022-08-11 LAB — TSH: TSH: 2.42 u[IU]/mL (ref 0.35–5.50)

## 2022-08-11 LAB — VITAMIN B12: Vitamin B-12: 502 pg/mL (ref 211–911)

## 2022-08-11 MED ORDER — AMPHETAMINE-DEXTROAMPHETAMINE 20 MG PO TABS
ORAL_TABLET | ORAL | 0 refills | Status: AC
Start: 2022-08-11 — End: ?

## 2022-08-11 NOTE — Patient Instructions (Addendum)
Please remember to call for your yearly Gyn appt  You will be contacted regarding the referral for: colonoscopy  Please have your Shingrix (shingles) shots done at your local pharmacy  Please take all new medication as prescribed - the adderall  Please continue all other medications as before, and refills have been done if requested.  Please have the pharmacy call with any other refills you may need.  Please continue your efforts at being more active, low cholesterol diet, and weight control.  You are otherwise up to date with prevention measures today.  Please keep your appointments with your specialists as you may have planned  Please go to the LAB at the blood drawing area for the tests to be done  You will be contacted by phone if any changes need to be made immediately.  Otherwise, you will receive a letter about your results with an explanation, but please check with MyChart first.  Please remember to sign up for MyChart if you have not done so, as this will be important to you in the future with finding out test results, communicating by private email, and scheduling acute appointments online when needed.  Please make an Appointment to return for your 1 year visit, or sooner if needed

## 2022-08-11 NOTE — Progress Notes (Unsigned)
Patient ID: FLORINDA TAFLINGER, female   DOB: 03/21/1966, 57 y.o.   MRN: [id number]         Chief Complaint:: wellness exam and hyperglycemia, hld, low vit d, ADD       HPI:  Robin Farley is a 57 y.o. female here for wellness exam; due for colonoscopy, plans to call herself for mammogram, for shingrix at pharmacy, o/w up to date               Also asks to restart ADD med as she is emerging from chemo tx and needs to start being more productive and complete tasks as before.  Pt denies chest pain, increased sob or doe, wheezing, orthopnea, PND, increased LE swelling, palpitations, dizziness or syncope.   Pt denies polydipsia, polyuria, or new focal neuro s/s.    Pt denies fever, wt loss, night sweats, loss of appetite, or other constitutional symptoms  Will be for eventual reconstructive tx.     Wt Readings from Last 3 Encounters:  08/11/22 167 lb 8 oz (76 kg)  07/27/22 160 lb (72.6 kg)  07/08/22 164 lb 9.6 oz (74.7 kg)   BP Readings from Last 3 Encounters:  08/11/22 126/82  08/10/22 136/88  08/03/22 129/79   Immunization History  Administered Date(s) Administered   Influenza Whole 01/25/2008   PFIZER(Purple Top)SARS-COV-2 Vaccination 11/04/2019, 12/01/2019   Td 04/13/1998, 03/13/2010   Health Maintenance Due  Topic Date Due   COLONOSCOPY (Pts 45-54yrs Insurance coverage will need to be confirmed)  01/08/2020      Past Medical History:  Diagnosis Date   Abdominal pain, generalized 12/27/2008   Acute sinusitis, unspecified 02/26/2007   ADD 03/13/2010   ALLERGIC RHINITIS 02/23/2007   ANEMIA 02/05/2007   Anxiety 11/17/2011   ATTENTION DEFICIT DISORDER, HX OF 02/05/2007   Breast cancer (HCC)    Deviated nasal septum 02/05/2007   Diabetes mellitus without complication (HCC)    Family history of breast cancer 11/20/2020   Family history of melanoma 11/20/2020   FATIGUE 02/23/2007   HYPERLIPIDEMIA 02/23/2007   Personal history of malignant melanoma 11/20/2020   Port-A-Cath in  place 12/03/2020   SWELLING MASS OR LUMP IN HEAD AND NECK 07/27/2008   UTI 02/23/2007   Past Surgical History:  Procedure Laterality Date   BREAST IMPLANT REMOVAL Right 07/27/2022   Procedure: Removal of right breast implant, washout, drain placemen;  Surgeon: Santiago Glad, MD;  Location: [facility] OR;  Service: Plastics;  Laterality: Right;   BREAST RECONSTRUCTION WITH PLACEMENT OF TISSUE EXPANDER AND FLEX HD (ACELLULAR HYDRATED DERMIS) Right 05/08/2021   Procedure: BREAST RECONSTRUCTION WITH PLACEMENT OF TISSUE EXPANDER AND FLEX HD (ACELLULAR HYDRATED DERMIS);  Surgeon: Allena Napoleon, MD;  Location: [facility];  Service: Plastics;  Laterality: Right;   BROW LIFT     CESAREAN SECTION     COSMETIC SURGERY  1995   Nasal   MASTECTOMY W/ SENTINEL NODE BIOPSY Right 05/08/2021   Procedure: RIGHT MASTECTOMY WITH SENTINEL LYMPH NODE BIOPSY;  Surgeon: Manus Rudd, MD;  Location: [facility];  Service: General;  Laterality: Right;   MELANOMA EXCISION WITH SENTINEL LYMPH NODE BIOPSY Left 10/24/2012   Procedure: MELANOMA wide EXCISION left lateral thigh WITH SENTINEL LYMPH NODE BIOPSY left groin;  Surgeon: Mariella Saa, MD;  Location: [facility];  Service: General;  Laterality: Left;   NASAL SEPTUM SURGERY     PORTACATH PLACEMENT Right 12/02/2020   Procedure: INSERTION PORT-A-CATH;  Surgeon: Corliss Skains,  Molli Hazard, MD;  Location: [facility];  Service: General;  Laterality: Right;   RADIOACTIVE SEED GUIDED AXILLARY SENTINEL LYMPH NODE Right 05/08/2021   Procedure: RADIOACTIVE SEED GUIDED AXILLARY SENTINEL LYMPH NODE DISSECTION;  Surgeon: Manus Rudd, MD;  Location: [facility];  Service: General;  Laterality: Right;   REMOVAL OF TISSUE EXPANDER AND PLACEMENT OF IMPLANT Right 05/05/2022   Procedure: REMOVAL OF TISSUE EXPANDER AND PLACEMENT OF IMPLANT;  Surgeon: Santiago Glad, MD;  Location:  SURGERY CENTER;  Service:  Plastics;  Laterality: Right;    reports that she has never smoked. She has never used smokeless tobacco. She reports that she does not currently use alcohol. She reports that she does not use drugs. family history includes Alcohol abuse in an other family member; Atrial fibrillation in her father and sister; Brain cancer (age of onset: 39) in her paternal grandfather; Breast cancer in an other family member; Breast cancer (age of onset: 10) in her mother; COPD in her father; Cancer in her paternal aunt; Depression in an other family member; Glaucoma in an other family member; Hypertension in her mother; Kidney cancer in her maternal aunt; Leukemia in her paternal aunt; Melanoma in her cousin. Allergies  Allergen Reactions   Promethazine Hcl     Legs twitch   Current Outpatient Medications on File Prior to Visit  Medication Sig Dispense Refill   doxycycline (VIBRA-TABS) 100 MG tablet Take 1 tablet (100 mg total) by mouth 2 (two) times daily. 20 tablet 0   melatonin 5 MG TABS Take 5 mg by mouth at bedtime as needed (sleep).     ondansetron (ZOFRAN-ODT) 4 MG disintegrating tablet Take 1 tablet (4 mg total) by mouth every 8 (eight) hours as needed for nausea or vomiting. 20 tablet 0   TURMERIC CURCUMIN PO Take 750 mg by mouth daily.     No current facility-administered medications on file prior to visit.        ROS:  All others reviewed and negative.  Objective        PE:  BP 126/82   Pulse 94   Temp 98.4 F (36.9 C) (Oral)   Ht 5\' 6"  (1.676 m)   Wt 167 lb 8 oz (76 kg)   LMP 04/27/2016   SpO2 98%   BMI 27.04 kg/m                 Constitutional: Pt appears in NAD               HENT: Head: NCAT.                Right Ear: External ear normal               Left Ear: External ear normal.                Eyes: . Pupils are equal, round, and reactive to light. Conjunctivae and EOM are normal               Nose: without d/c or deformity               Neck: Neck supple. Gross normal ROM                Cardiovascular: Normal rate and regular rhythm.                 Pulmonary/Chest: Effort normal and breath sounds without rales or wheezing.  Abd:  Soft, NT, ND, + BS, no organomegaly               Neurological: Pt is alert. At baseline orientation, motor grossly intact               Skin: Skin is warm. No rashes, no other new lesions, LE edema - none               Psychiatric: Pt behavior is normal without agitation   Micro: none  Cardiac tracings I have personally interpreted today:  none  Pertinent Radiological findings (summarize): none   Lab Results  Component Value Date   WBC 5.6 08/11/2022   HGB 12.7 08/11/2022   HCT 37.5 08/11/2022   PLT 242.0 08/11/2022   GLUCOSE 84 08/11/2022   CHOL 290 (H) 08/11/2022   TRIG 172.0 (H) 08/11/2022   HDL 61.50 08/11/2022   LDLDIRECT 159.0 08/21/2014   LDLCALC 194 (H) 08/11/2022   ALT 23 08/11/2022   AST 22 08/11/2022   NA 140 08/11/2022   K 4.6 08/11/2022   CL 104 08/11/2022   CREATININE 0.82 08/11/2022   BUN 16 08/11/2022   CO2 28 08/11/2022   TSH 2.42 08/11/2022   HGBA1C 5.4 08/11/2022   Assessment/Plan:  DAYLA GASCA is a 57 y.o. White or Caucasian [1] female with  has a past medical history of Abdominal pain, generalized (12/27/2008), Acute sinusitis, unspecified (02/26/2007), ADD (03/13/2010), ALLERGIC RHINITIS (02/23/2007), ANEMIA (02/05/2007), Anxiety (11/17/2011), ATTENTION DEFICIT DISORDER, HX OF (02/05/2007), Breast cancer (HCC), Deviated nasal septum (02/05/2007), Diabetes mellitus without complication (HCC), Family history of breast cancer (11/20/2020), Family history of melanoma (11/20/2020), FATIGUE (02/23/2007), HYPERLIPIDEMIA (02/23/2007), Personal history of malignant melanoma (11/20/2020), Port-A-Cath in place (12/03/2020), SWELLING MASS OR LUMP IN HEAD AND NECK (07/27/2008), and UTI (02/23/2007).  Encounter for well adult exam with abnormal findings Age and sex appropriate education and  counseling updated with regular exercise and diet Referrals for preventative services - pt to call for mammogram Immunizations addressed - for colonoscopy referral, for shingrix at pharmacy Smoking counseling  - none needed Evidence for depression or other mood disorder - none significant Most recent labs reviewed. I have personally reviewed and have noted: 1) the patient's medical and social history 2) The patient's current medications and supplements 3) The patient's height, weight, and BMI have been recorded in the chart   Attention deficit disorder With mild worsening symptoms, now for restart adderal 20 bid  Hyperglycemia Lab Results  Component Value Date   HGBA1C 5.4 08/11/2022   Stable, pt to continue current medical treatment  - diet, wt control  Hyperlipidemia Lab Results  Component Value Date   LDLCALC 194 (H) 08/11/2022   Severe uncontrolled, pt not ready to restart statin for now, cont lower chol diet, consdier card CT score  Vitamin D deficiency Last vitamin D Lab Results  Component Value Date   VD25OH 25.99 (L) 08/11/2022   Low, to start oral replacement  Followup: Return in about 1 year (around 08/11/2023).  Oliver Barre, MD 08/13/2022 6:49 PM [facility] - [facility]

## 2022-08-13 ENCOUNTER — Encounter: Payer: Self-pay | Admitting: Internal Medicine

## 2022-08-13 NOTE — Assessment & Plan Note (Signed)
Age and sex appropriate education and counseling updated with regular exercise and diet Referrals for preventative services - pt to call for mammogram Immunizations addressed - for colonoscopy referral, for shingrix at pharmacy Smoking counseling  - none needed Evidence for depression or other mood disorder - none significant Most recent labs reviewed. I have personally reviewed and have noted: 1) the patient's medical and social history 2) The patient's current medications and supplements 3) The patient's height, weight, and BMI have been recorded in the chart

## 2022-08-13 NOTE — Assessment & Plan Note (Signed)
Lab Results  Component Value Date   LDLCALC 194 (H) 08/11/2022   Severe uncontrolled, pt not ready to restart statin for now, cont lower chol diet, consdier card CT score

## 2022-08-13 NOTE — Assessment & Plan Note (Signed)
Lab Results  Component Value Date   HGBA1C 5.4 08/11/2022   Stable, pt to continue current medical treatment  - diet, wt control

## 2022-08-13 NOTE — Assessment & Plan Note (Signed)
Last vitamin D Lab Results  Component Value Date   VD25OH 25.99 (L) 08/11/2022   Low, to start oral replacement

## 2022-08-13 NOTE — Assessment & Plan Note (Signed)
With mild worsening symptoms, now for restart adderal 20 bid

## 2022-08-16 LAB — AEROBIC CULTURE

## 2022-08-16 LAB — SPECIMEN STATUS REPORT

## 2022-08-17 ENCOUNTER — Ambulatory Visit (INDEPENDENT_AMBULATORY_CARE_PROVIDER_SITE_OTHER): Payer: Managed Care, Other (non HMO) | Admitting: Physician Assistant

## 2022-08-17 VITALS — BP 116/76 | HR 79

## 2022-08-17 DIAGNOSIS — Z9889 Other specified postprocedural states: Secondary | ICD-10-CM

## 2022-08-17 NOTE — Progress Notes (Signed)
This is a 57 year old female who is status post right-sided breast cancer status post unilateral mastectomy and radiation with implant-based reconstruction on 05/05/2022.  She is now status post removal of right breast implant performed by Dr. Ladona Ridgel on 07/27/2022.  The patient was last seen in the office on 08/10/2022.  At that time she had reaccumulation of fluid within the breast pocket, she had some ongoing redness along the medial/lateral aspect of the incision.  She was started on antibiotics and encouraged to continue using compressive garments.  Since her last office visit she notes that she has been adhering to the compressive garments, she does note some reaccumulation of fluid.  Her fluid cultures from the last aspiration showed no growth.  On exam the right mastectomy flaps are viable, incision is clean dry and intact with a wound along the right medial/lateral aspect with overlying scabbing, some surrounding redness noted, Prolene stitches in place.  Palpable fluid collection noted.,  No warmth to touch.    Overall the patient has had some improvement in the redness surrounding the wound, we will continue using Vaseline on this, she is instructed to continue using the doxycycline.  Her previous aspirate did not grow out bacteria.  She has reaccumulated some fluid, after risks and benefits I did use a butterfly needle to aspirate approximately 30 cc of serous fluid.  This was not sent for culture given the previous fluid was negative for growth.  I have instructed the patient to continue with compressive garments, continue Vaseline, would like to see her back in 1 week for repeat evaluation or sooner as needed.  The patient verbalized understanding and agreement to today's plan.

## 2022-08-24 ENCOUNTER — Telehealth: Payer: Self-pay

## 2022-08-24 ENCOUNTER — Ambulatory Visit (INDEPENDENT_AMBULATORY_CARE_PROVIDER_SITE_OTHER): Payer: Managed Care, Other (non HMO) | Admitting: Physician Assistant

## 2022-08-24 DIAGNOSIS — Z9889 Other specified postprocedural states: Secondary | ICD-10-CM

## 2022-08-24 NOTE — Progress Notes (Signed)
This is a 57 year old female who is status post right-sided breast cancer status post unilateral mastectomy and radiation with implant-based reconstruction on 05/05/2022. She is now status post removal of right breast implant performed by Dr. Ladona Ridgel on 07/27/2022.  The patient was last seen in the office on 08/17/2022.  At that time she had been doing well.  She had some redness along the lateral incision line as well as reaccumulation of fluid.  This was drained.  She had previously had aspirate sent for culture which revealed no growth.  She was placed on doxycycline.  Since her last office visit she denies any significant complaints.  She notes some reaccumulation of fluid, she denies any worsening of the redness along the lateral incision.  She denies any infectious signs or symptoms.  Chaperone present.  On exam right mastectomy flaps are viable, incision is clean dry and intact with the exception of a wound along the right lateral aspect with some surrounding redness, Prolene stitches in place, at the site of the redness the Prolenes are loose.  Minimal palpable fluid collection noted.    I discussed aspiration with the patient, she elected to proceed.  I was able to remove approximately cc of serous fluid.  Given the previous negative cultures this was not sent for Gram stain or culture.  The sutures along the incision at the site of the redness were loose and entangled in a scab.  Given they were not providing any tensile strength I did remove it as I felt it would be a risk for her to accidentally pull on it.  Some of the other sutures were removed, and the remainder were left in place.  Currently the patient is not on antibiotics, I have lower suspicion that that area of redness is infection given no significant improvement or worsening.  I would like to follow patient closely, she will follow-up in the office in 1 week for repeat evaluation, she understands to reach out to our office immediately if  she develops any new or worsening signs or symptoms.  She verbalized understanding and agreement to today's plan.

## 2022-08-24 NOTE — Telephone Encounter (Signed)
Faxed Rx, demo, [employer], and OV note to [facility] with confirmed receipt. I will scan to chart.

## 2022-08-31 ENCOUNTER — Encounter: Payer: Self-pay | Admitting: Student

## 2022-08-31 ENCOUNTER — Ambulatory Visit (INDEPENDENT_AMBULATORY_CARE_PROVIDER_SITE_OTHER): Payer: Managed Care, Other (non HMO) | Admitting: Student

## 2022-08-31 VITALS — BP 121/75 | HR 79

## 2022-08-31 DIAGNOSIS — Z9889 Other specified postprocedural states: Secondary | ICD-10-CM

## 2022-08-31 NOTE — Progress Notes (Signed)
Patient is a 57 year old female with history of right-sided breast cancer status post unilateral mastectomy and radiation with implant-based reconstruction on 05/05/2022.  Patient most recently underwent removal of the right breast implant with Dr. Ladona Ridgel on 07/27/2022.  She is 5 weeks postop.  Patient presents to the clinic today for postoperative follow-up.  Patient was last seen in the clinic on 08/24/2022.  At this visit, patient reported she was doing well.  She did state that she noted some reaccumulation of fluid but denies any worsening redness along the lateral aspect of her incision.  She denied any infectious signs or symptoms.  On exam, right mastectomy flaps appear to be viable.  Incision was clean, dry and intact.  There was a wound to the right lateral aspect of the incision with some surrounding redness.  Prolene sutures were in place at the site of the redness.  There is minimal palpable fluid on exam.  Aspiration was performed and serous fluid was removed.  Sutures at the site of the redness were removed.  Some of the other sutures were removed and the remainder were left in place.  Low suspicion that the area of redness was infection.  Plan was for patient to follow-up in 1 week.  Today, patient reports she is doing well.  She states that she has noticed a little bit of fluid at her surgical site, but she states that its less than has been before.  She denies any drainage.  She states that the redness at her incision is about the same.  She denies any fevers or chills.  Chaperone present on exam.  On exam, patient is sitting upright in no acute distress.  Right breast incision is clean dry and intact.  There are several Prolene sutures noted throughout the incision, but several of them have come loose.  There is no active drainage noted on exam.  There is a little bit of surrounding erythema which appears to be consistent with some irritation noted to the lateral aspect of the incision.  There  is no tenderness to palpation.  There is a little bit of fluid palpated on exam.  Sutures were removed.  Patient tolerated well.  I discussed with the patient that we could aspirate the fluid today.  I discussed the risks of aspiration versus not aspirating the fluid.  Patient opted to not aspirate today and see if her body will reabsorb the fluid.  I discussed with the patient the importance of continue to wear compression at all times.  I also discussed with the patient that if she feels more fluid starts to accumulate or if she has any concerns about the fluid to her right chest wall she needs to let us know.  Patient expressed understanding.  I discussed with the patient that the redness around her incision appears to be more irritation rather than infection.  I discussed with her like her to continue to monitor this.  I recommended that she apply Vaseline daily to her incision.  Patient expressed understanding.  I also discussed with the patient that she could possibly need further intervention given that she continues to accumulate fluid underneath her incision.  We will plan for her to follow-up with Dr. Ladona Ridgel later this week for evaluation and to talk about possible further interventions.  Patient expressed understanding.  I instructed the patient to call us if she has any questions or concerns about anything.

## 2022-09-03 ENCOUNTER — Ambulatory Visit (INDEPENDENT_AMBULATORY_CARE_PROVIDER_SITE_OTHER): Payer: Managed Care, Other (non HMO) | Admitting: Plastic Surgery

## 2022-09-03 VITALS — BP 119/79 | HR 80

## 2022-09-03 DIAGNOSIS — N6489 Other specified disorders of breast: Secondary | ICD-10-CM

## 2022-09-03 DIAGNOSIS — Z9889 Other specified postprocedural states: Secondary | ICD-10-CM

## 2022-09-03 NOTE — Progress Notes (Signed)
Robin Farley is 1 month out from removal of her right breast implant.  She has had a seroma which has been drained twice.  She returns today with a small amount of fluid at the surgical site.  The erythema over the incision is improved.  She is not complaining of any pain fever chills or drainage at this point.  I have elected to continue conservative treatment and have not aspirated the seroma.  If she has worsening pain and increase in the seroma or any drainage will need to go back to the operating room for more definitive therapy.  She has a family event that she would like to attend on June 1.  Between now and then will only consider needle aspiration unless she shows signs of infection.  Follow-up 1 week.

## 2022-09-10 ENCOUNTER — Encounter: Payer: Self-pay | Admitting: Student

## 2022-09-10 ENCOUNTER — Ambulatory Visit (INDEPENDENT_AMBULATORY_CARE_PROVIDER_SITE_OTHER): Payer: Managed Care, Other (non HMO) | Admitting: Student

## 2022-09-10 VITALS — BP 139/85 | HR 84

## 2022-09-10 DIAGNOSIS — N6489 Other specified disorders of breast: Secondary | ICD-10-CM

## 2022-09-10 DIAGNOSIS — Z9889 Other specified postprocedural states: Secondary | ICD-10-CM

## 2022-09-10 NOTE — Progress Notes (Signed)
Patient is a 57 year old female with history of right-sided breast cancer status post unilateral mastectomy and radiation with implant-based reconstruction on 05/05/2022.  Patient most recently underwent removal of right breast implant with Dr. Ladona Ridgel on 07/27/2022.  She is a little over 6 weeks postop.  Patient presents to the clinic today for postoperative follow-up.  Patient was last seen in the clinic on 09/03/2022.  At this visit, patient was noted to have a seroma which had been drained twice.  She still had a small amount of fluid at the surgical site at the time of the exam.  She denied any infectious symptoms.  Plan was to continue conservative treatment and not aspirate the seroma at the time of exam.  If patient were to have worsening pain or an increase in the seroma, or any drainage, patient would need to go back to the operating room for more definitive therapy.  Plan was for patient to follow-up in 1 week.  Today, patient reports she is doing well.  She states that her right breast swelling is approximately the same as her previous visit.  She states it is not painful and has not grown in size.  She denies any drainage, fevers or chills.  Patient does report she has a little bit of pain in her right shoulder.  Patient reports she does go to physical therapy for this every 3 months for follow-up.  Patient also states that her daughter's wedding is this weekend.  Chaperone present on exam.  On exam, patient is sitting upright in no acute distress.  Right breast incision is intact.  There is still some surrounding erythema to the incision, which appears to be irritation rather than infection.  There is no cellulitic changes to the skin.  And it also appears to be similar to previous exam.  There is some fluid underneath the incision, similar to when I saw her a few weeks ago.  There is no active drainage on exam.  There is no malodor on exam.  The patient and I discussed the risks and benefits of  aspiration.  We discussed the risk specifically of infection.  Given that the right breast fluid has not worsened, is not causing pain and appears to be stable, we will hold off on aspiration for now.  I discussed with the patient and like her to continue with compression at all times.  Patient expressed understanding.  In terms of patient's right shoulder pain, I recommended that she continue some range of motion exercises with her right upper extremity.  I offered to have her be treated by physical therapy for this, but she states she has an appointment with them on the 24th and will discuss it with them at that time.  She states she would like to wait until then to move forward with physical therapy or not.  Patient states in the meantime she has exercises that she can perform.  We will have the patient follow back up in 1 week for reevaluation for possible aspiration versus planning to go back to the operating room.  I instructed the patient in the meantime to call if she has any questions or concerns about anything.

## 2022-09-11 ENCOUNTER — Telehealth: Payer: Self-pay | Admitting: *Deleted

## 2022-09-11 NOTE — Telephone Encounter (Signed)
-------  Fax Transmission Report-------  To:               Recipient at [phone] Subject:          [secure] Clinical requested case [id number] Result:           The transmission was successful. Explanation:      All Pages Ok Pages Sent:       14 Connect Time:     18 minutes, 52 seconds Transmit Time:    09/11/2022 13:05 Transfer Rate:    14400 Status Code:      0000 Retry Count:      1 Job Id:           [id number] Unique Id:        [id number] Fax Line:         50 Fax Server:       [employer]

## 2022-09-17 ENCOUNTER — Encounter: Payer: Self-pay | Admitting: Student

## 2022-09-17 ENCOUNTER — Ambulatory Visit: Payer: Managed Care, Other (non HMO) | Admitting: Student

## 2022-09-17 ENCOUNTER — Encounter (HOSPITAL_BASED_OUTPATIENT_CLINIC_OR_DEPARTMENT_OTHER): Payer: Self-pay | Admitting: Plastic Surgery

## 2022-09-17 ENCOUNTER — Other Ambulatory Visit: Payer: Self-pay

## 2022-09-17 VITALS — BP 137/87 | HR 70

## 2022-09-17 DIAGNOSIS — N6489 Other specified disorders of breast: Secondary | ICD-10-CM

## 2022-09-17 MED ORDER — ONDANSETRON HCL 4 MG PO TABS: 4.0000 mg | ORAL_TABLET | Freq: Three times a day (TID) | ORAL | 0 refills | Status: DC | PRN

## 2022-09-17 MED ORDER — OXYCODONE HCL 5 MG PO TABS: 5.0000 mg | ORAL_TABLET | Freq: Four times a day (QID) | ORAL | 0 refills | Status: DC | PRN

## 2022-09-17 NOTE — H&P (View-Only) (Signed)
   Patient ID: Robin Farley, female    DOB: 09/05/1965, 57 y.o.   MRN: [id number]  Chief Complaint  Patient presents with   Post-op Follow-up      ICD-10-CM   1. Seroma of breast  N64.89        History of Present Illness: Robin Farley is a 57 y.o.  female  with a history of right breast cancer.  Patient presented to the clinic today for postoperative follow-up.  Today, patient reported she was doing well.  Husband accompanies her at bedside.  On exam, fluid still present underneath the right breast incision.  Incision appears to be intact.  There is still a little bit of redness/irritation surrounding the incision, similar to previous exams.  Dr. Taylor had the opportunity to evaluate the patient.  Decision was made to take patient back to the operating room as patient continues to have seroma.  Patient was in agreement with this plan.  She presents for preoperative evaluation for upcoming procedure, right breast washout, debridement and drain placement, scheduled for 09/22/2022 with Dr. Taylor.  The patient has not had problems with anesthesia.  Patient denies any cardiac disease.  She denies taking any blood thinners.  Patient reports she is not a smoker.  Patient denies taking any birth control or hormone replacement.  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  She denies any personal family history of blood clots or clotting diseases.  She denies any recent surgeries, traumas, infections or hospitalizations.  She denies any history of stroke or heart attack.  She states that she does not have a Port-A-Cath anymore.  Patient denies any history of Crohn's disease or ulcerative colitis.  She denies any history of COPD or asthma.  Patient does have history of cancer.  Patient denies any varicosities to her lower extremities.  She denies any fevers or chills recently.  Summary of Previous Visit: Patient was last seen in the office on 09/10/2022.  At this visit, there was still some irritation  near the incision.  There was still also some fluid underneath the incision.  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  PMH Significant for: Right breast cancer, ADHD   Past Medical History: Allergies: Allergies  Allergen Reactions   Promethazine Hcl     Legs twitch    Current Medications:  Current Outpatient Medications:    amphetamine-dextroamphetamine (ADDERALL) 20 MG tablet, Take 1.5 tabs by mouth twice per day, Disp: 90 tablet, Rfl: 0   melatonin 5 MG TABS, Take 5 mg by mouth at bedtime as needed (sleep)., Disp: , Rfl:    ondansetron (ZOFRAN) 4 MG tablet, Take 1 tablet (4 mg total) by mouth every 8 (eight) hours as needed for up to 20 doses for nausea or vomiting., Disp: 20 tablet, Rfl: 0   ondansetron (ZOFRAN-ODT) 4 MG disintegrating tablet, Take 1 tablet (4 mg total) by mouth every 8 (eight) hours as needed for nausea or vomiting., Disp: 20 tablet, Rfl: 0   oxyCODONE (ROXICODONE) 5 MG immediate release tablet, Take 1 tablet (5 mg total) by mouth every 6 (six) hours as needed for up to 20 doses for severe pain., Disp: 20 tablet, Rfl: 0   TURMERIC CURCUMIN PO, Take 750 mg by mouth daily., Disp: , Rfl:   Past Medical Problems: Past Medical History:  Diagnosis Date   Abdominal pain,   generalized 12/27/2008   Acute sinusitis, unspecified 02/26/2007   ADD 03/13/2010   ALLERGIC RHINITIS 02/23/2007   ANEMIA 02/05/2007   Anxiety 11/17/2011   ATTENTION DEFICIT DISORDER, HX OF 02/05/2007   Breast cancer (HCC)    Deviated nasal septum 02/05/2007   Family history of breast cancer 11/20/2020   Family history of melanoma 11/20/2020   FATIGUE 02/23/2007   HYPERLIPIDEMIA 02/23/2007   Personal history of malignant melanoma 11/20/2020   Port-A-Cath in place 12/03/2020   SWELLING MASS OR LUMP IN HEAD AND NECK 07/27/2008   UTI 02/23/2007    Past Surgical  History: Past Surgical History:  Procedure Laterality Date   BREAST IMPLANT REMOVAL Right 07/27/2022   Procedure: Removal of right breast implant, washout, drain placemen;  Surgeon: Taylor, Jackson B, MD;  Location: [facility] OR;  Service: Plastics;  Laterality: Right;   BREAST RECONSTRUCTION WITH PLACEMENT OF TISSUE EXPANDER AND FLEX HD (ACELLULAR HYDRATED DERMIS) Right 05/08/2021   Procedure: BREAST RECONSTRUCTION WITH PLACEMENT OF TISSUE EXPANDER AND FLEX HD (ACELLULAR HYDRATED DERMIS);  Surgeon: Pace, Collier S, MD;  Location: [facility];  Service: Plastics;  Laterality: Right;   BROW LIFT     CESAREAN SECTION     COSMETIC SURGERY  1995   Nasal   MASTECTOMY W/ SENTINEL NODE BIOPSY Right 05/08/2021   Procedure: RIGHT MASTECTOMY WITH SENTINEL LYMPH NODE BIOPSY;  Surgeon: Tsuei, Matthew, MD;  Location: [facility];  Service: General;  Laterality: Right;   MELANOMA EXCISION WITH SENTINEL LYMPH NODE BIOPSY Left 10/24/2012   Procedure: MELANOMA wide EXCISION left lateral thigh WITH SENTINEL LYMPH NODE BIOPSY left groin;  Surgeon: Benjamin T Hoxworth, MD;  Location: [facility];  Service: General;  Laterality: Left;   NASAL SEPTUM SURGERY     PORTACATH PLACEMENT Right 12/02/2020   Procedure: INSERTION PORT-A-CATH;  Surgeon: Tsuei, Matthew, MD;  Location: [facility];  Service: General;  Laterality: Right;   RADIOACTIVE SEED GUIDED AXILLARY SENTINEL LYMPH NODE Right 05/08/2021   Procedure: RADIOACTIVE SEED GUIDED AXILLARY SENTINEL LYMPH NODE DISSECTION;  Surgeon: Tsuei, Matthew, MD;  Location:  SURGERY CENTER;  Service: General;  Laterality: Right;   REMOVAL OF TISSUE EXPANDER AND PLACEMENT OF IMPLANT Right 05/05/2022   Procedure: REMOVAL OF TISSUE EXPANDER AND PLACEMENT OF IMPLANT;  Surgeon: Taylor, Jackson B, MD;  Location:  SURGERY CENTER;  Service: Plastics;  Laterality: Right;    Social History: Social History   Socioeconomic  History   Marital status: Married    Spouse name: Jeffery   Number of children: 2   Years of education: Not on file   Highest education level: Not on file  Occupational History   Occupation: master's in fine arts [employer]    Employer: [employer]  Tobacco Use   Smoking status: Never   Smokeless tobacco: Never  Vaping Use   Vaping Use: Never used  Substance and Sexual Activity   Alcohol use: Not Currently    Comment: Very rare occasions   Drug use: Never   Sexual activity: Yes    Birth control/protection: None, Post-menopausal  Other Topics Concern   Not on file  Social History Narrative   Not on file   Social Determinants of Health   Financial Resource Strain: Low Risk  (11/20/2020)   Overall Financial Resource Strain (CARDIA)    Difficulty of Paying Living Expenses: Not hard at all  Food Insecurity: No Food Insecurity (11/20/2020)   Hunger Vital Sign      Worried About Running Out of Food in the Last Year: Never true    Ran Out of Food in the Last Year: Never true  Transportation Needs: No Transportation Needs (11/20/2020)   PRAPARE - Transportation    Lack of Transportation (Medical): No    Lack of Transportation (Non-Medical): No  Physical Activity: Not on file  Stress: Not on file  Social Connections: Not on file  Intimate Partner Violence: Not on file    Family History: Family History  Problem Relation Age of Onset   Hypertension Mother    Breast cancer Mother 75   COPD Father    Atrial fibrillation Father    Atrial fibrillation Sister    Kidney cancer Maternal Aunt        dx 50s   Leukemia Paternal Aunt        dx after 50   Cancer Paternal Aunt        unknown type; dx after 50   Brain cancer Paternal Grandfather 73       brain tumor per pt   Melanoma Cousin        arm; no mets per pt   Alcohol abuse Other    Depression Other    Glaucoma Other    Breast cancer Other        MGM's sisters, x3, dx after 50   Colon polyps Neg Hx     Review of  Systems: Denies any recent fevers, chills or changes in her health  Physical Exam: Vital Signs BP 137/87 (BP Location: Left Arm, Patient Position: Sitting, Cuff Size: Normal)   Pulse 70   LMP 04/27/2016   SpO2 98%   Physical Exam  Constitutional:      General: Not in acute distress.    Appearance: Normal appearance. Not ill-appearing.  HENT:     Head: Normocephalic and atraumatic.  Eyes:     Pupils: Pupils are equal, round Neck:     Musculoskeletal: Normal range of motion.  Chest: Incision intact, fluid noted underneath the incision.  A little bit of surrounding erythema similar to previous exams. Cardiovascular:     Rate and Rhythm: Normal rate Pulmonary:     Effort: Pulmonary effort is normal. No respiratory distress.  Musculoskeletal: Normal range of motion.  Skin:    General: Skin is warm and dry.     Findings: No erythema or rash.  Neurological:     Mental Status: Alert and oriented to person, place, and time. Mental status is at baseline.  Psychiatric:        Mood and Affect: Mood normal.        Behavior: Behavior normal.    Assessment/Plan: The patient is scheduled for right breast washout, debridement and drain placement with Dr. Taylor.  Risks, benefits, and alternatives of procedure discussed, questions answered and consent obtained.    Smoking Status: Non-smoker; Counseling Given?  N/A Last Mammogram: History of right breast cancer status post mastectomy and reconstruction;  Caprini Score: 6; Risk Factors include: Age, BMI > 25, history of malignancy, and length of planned surgery. Recommendation for mechanical prophylaxis. Encourage early ambulation.   Pictures obtained: Today  Post-op Rx sent to pharmacy: Oxycodone, Zofran  Patient reported she takes medication for ADHD.  I recommended she hold this the day of surgery.  Patient also reports she takes supplements and vitamins as well as drinks herbal tea.  I recommended she hold this along with ibuprofen 1  week prior to surgery.  Patient expressed understanding.    Patient was provided with the General Surgical Risk consent document and Pain Medication Agreement prior to their appointment.  They had adequate time to read through the risk consent documents and Pain Medication Agreement. We also discussed them in person together during this preop appointment. All of their questions were answered to their satisfaction.  Recommended calling if they have any further questions.  Risk consent form and Pain Medication Agreement to be scanned into patient's chart.  The consent was obtained with risks and complications reviewed which included bleeding, pain, scar, infection and the risk of anesthesia.  The patients questions were answered to the patients expressed satisfaction.    Electronically signed by: Naseem Adler E Judi Jaffe, PA-C 09/17/2022 1:11 PM  

## 2022-09-17 NOTE — Progress Notes (Signed)
Patient ID: Robin Farley, female    DOB: February 15, 1966, 57 y.o.   MRN: [id number]  Chief Complaint  Patient presents with   Post-op Follow-up      ICD-10-CM   1. Seroma of breast  N64.89        History of Present Illness: Robin Farley is a 57 y.o.  female  with a history of right breast cancer.  Patient presented to the clinic today for postoperative follow-up.  Today, patient reported she was doing well.  Husband accompanies her at bedside.  On exam, fluid still present underneath the right breast incision.  Incision appears to be intact.  There is still a little bit of redness/irritation surrounding the incision, similar to previous exams.  Dr. Ladona Ridgel had the opportunity to evaluate the patient.  Decision was made to take patient back to the operating room as patient continues to have seroma.  Patient was in agreement with this plan.  She presents for preoperative evaluation for upcoming procedure, right breast washout, debridement and drain placement, scheduled for 09/22/2022 with Dr. Ladona Ridgel.  The patient has not had problems with anesthesia.  Patient denies any cardiac disease.  She denies taking any blood thinners.  Patient reports she is not a smoker.  Patient denies taking any birth control or hormone replacement.  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  She denies any personal family history of blood clots or clotting diseases.  She denies any recent surgeries, traumas, infections or hospitalizations.  She denies any history of stroke or heart attack.  She states that she does not have a Port-A-Cath anymore.  Patient denies any history of Crohn's disease or ulcerative colitis.  She denies any history of COPD or asthma.  Patient does have history of cancer.  Patient denies any varicosities to her lower extremities.  She denies any fevers or chills recently.  Summary of Previous Visit: Patient was last seen in the office on 09/10/2022.  At this visit, there was still some irritation  near the incision.  There was still also some fluid underneath the incision.  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  PMH Significant for: Right breast cancer, ADHD   Past Medical History: Allergies: Allergies  Allergen Reactions   Promethazine Hcl     Legs twitch    Current Medications:  Current Outpatient Medications:    amphetamine-dextroamphetamine (ADDERALL) 20 MG tablet, Take 1.5 tabs by mouth twice per day, Disp: 90 tablet, Rfl: 0   melatonin 5 MG TABS, Take 5 mg by mouth at bedtime as needed (sleep)., Disp: , Rfl:    ondansetron (ZOFRAN) 4 MG tablet, Take 1 tablet (4 mg total) by mouth every 8 (eight) hours as needed for up to 20 doses for nausea or vomiting., Disp: 20 tablet, Rfl: 0   ondansetron (ZOFRAN-ODT) 4 MG disintegrating tablet, Take 1 tablet (4 mg total) by mouth every 8 (eight) hours as needed for nausea or vomiting., Disp: 20 tablet, Rfl: 0   oxyCODONE (ROXICODONE) 5 MG immediate release tablet, Take 1 tablet (5 mg total) by mouth every 6 (six) hours as needed for up to 20 doses for severe pain., Disp: 20 tablet, Rfl: 0   TURMERIC CURCUMIN PO, Take 750 mg by mouth daily., Disp: , Rfl:   Past Medical Problems: Past Medical History:  Diagnosis Date   Abdominal pain,  generalized 12/27/2008   Acute sinusitis, unspecified 02/26/2007   ADD 03/13/2010   ALLERGIC RHINITIS 02/23/2007   ANEMIA 02/05/2007   Anxiety 11/17/2011   ATTENTION DEFICIT DISORDER, HX OF 02/05/2007   Breast cancer (HCC)    Deviated nasal septum 02/05/2007   Family history of breast cancer 11/20/2020   Family history of melanoma 11/20/2020   FATIGUE 02/23/2007   HYPERLIPIDEMIA 02/23/2007   Personal history of malignant melanoma 11/20/2020   Port-A-Cath in place 12/03/2020   SWELLING MASS OR LUMP IN HEAD AND NECK 07/27/2008   UTI 02/23/2007    Past Surgical  History: Past Surgical History:  Procedure Laterality Date   BREAST IMPLANT REMOVAL Right 07/27/2022   Procedure: Removal of right breast implant, washout, drain placemen;  Surgeon: Santiago Glad, MD;  Location: [facility];  Service: Plastics;  Laterality: Right;   BREAST RECONSTRUCTION WITH PLACEMENT OF TISSUE EXPANDER AND FLEX HD (ACELLULAR HYDRATED DERMIS) Right 05/08/2021   Procedure: BREAST RECONSTRUCTION WITH PLACEMENT OF TISSUE EXPANDER AND FLEX HD (ACELLULAR HYDRATED DERMIS);  Surgeon: Allena Napoleon, MD;  Location: [facility];  Service: Plastics;  Laterality: Right;   BROW LIFT     CESAREAN SECTION     COSMETIC SURGERY  1995   Nasal   MASTECTOMY W/ SENTINEL NODE BIOPSY Right 05/08/2021   Procedure: RIGHT MASTECTOMY WITH SENTINEL LYMPH NODE BIOPSY;  Surgeon: Manus Rudd, MD;  Location: [facility];  Service: General;  Laterality: Right;   MELANOMA EXCISION WITH SENTINEL LYMPH NODE BIOPSY Left 10/24/2012   Procedure: MELANOMA wide EXCISION left lateral thigh WITH SENTINEL LYMPH NODE BIOPSY left groin;  Surgeon: Mariella Saa, MD;  Location: [facility];  Service: General;  Laterality: Left;   NASAL SEPTUM SURGERY     PORTACATH PLACEMENT Right 12/02/2020   Procedure: INSERTION PORT-A-CATH;  Surgeon: Manus Rudd, MD;  Location: [facility];  Service: General;  Laterality: Right;   RADIOACTIVE SEED GUIDED AXILLARY SENTINEL LYMPH NODE Right 05/08/2021   Procedure: RADIOACTIVE SEED GUIDED AXILLARY SENTINEL LYMPH NODE DISSECTION;  Surgeon: Manus Rudd, MD;  Location: [facility];  Service: General;  Laterality: Right;   REMOVAL OF TISSUE EXPANDER AND PLACEMENT OF IMPLANT Right 05/05/2022   Procedure: REMOVAL OF TISSUE EXPANDER AND PLACEMENT OF IMPLANT;  Surgeon: Santiago Glad, MD;  Location: [facility];  Service: Plastics;  Laterality: Right;    Social History: Social History   Socioeconomic  History   Marital status: Married    Spouse name: Geologist, engineering   Number of children: 2   Years of education: Not on file   Highest education level: Not on file  Occupational History   Occupation: Education administrator in fine arts [employer]    Employer: [employer]  Tobacco Use   Smoking status: Never   Smokeless tobacco: Never  Vaping Use   Vaping Use: Never used  Substance and Sexual Activity   Alcohol use: Not Currently    Comment: Very rare occasions   Drug use: Never   Sexual activity: Yes    Birth control/protection: None, Post-menopausal  Other Topics Concern   Not on file  Social History Narrative   Not on file   Social Determinants of Health   Financial Resource Strain: Low Risk  (11/20/2020)   Overall Financial Resource Strain (CARDIA)    Difficulty of Paying Living Expenses: Not hard at all  Food Insecurity: No Food Insecurity (11/20/2020)   Hunger Vital Sign  Worried About Programme researcher, broadcasting/film/video in the Last Year: Never true    Ran Out of Food in the Last Year: Never true  Transportation Needs: No Transportation Needs (11/20/2020)   PRAPARE - Administrator, Civil Service (Medical): No    Lack of Transportation (Non-Medical): No  Physical Activity: Not on file  Stress: Not on file  Social Connections: Not on file  Intimate Partner Violence: Not on file    Family History: Family History  Problem Relation Age of Onset   Hypertension Mother    Breast cancer Mother 71   COPD Father    Atrial fibrillation Father    Atrial fibrillation Sister    Kidney cancer Maternal Aunt        dx 72s   Leukemia Paternal Aunt        dx after 29   Cancer Paternal Aunt        unknown type; dx after 37   Brain cancer Paternal Grandfather 36       brain tumor per pt   Melanoma Cousin        arm; no mets per pt   Alcohol abuse Other    Depression Other    Glaucoma Other    Breast cancer Other        MGM's sisters, x3, dx after 69   Colon polyps Neg Hx     Review of  Systems: Denies any recent fevers, chills or changes in her health  Physical Exam: Vital Signs BP 137/87 (BP Location: Left Arm, Patient Position: Sitting, Cuff Size: Normal)   Pulse 70   LMP 04/27/2016   SpO2 98%   Physical Exam  Constitutional:      General: Not in acute distress.    Appearance: Normal appearance. Not ill-appearing.  HENT:     Head: Normocephalic and atraumatic.  Eyes:     Pupils: Pupils are equal, round Neck:     Musculoskeletal: Normal range of motion.  Chest: Incision intact, fluid noted underneath the incision.  A little bit of surrounding erythema similar to previous exams. Cardiovascular:     Rate and Rhythm: Normal rate Pulmonary:     Effort: Pulmonary effort is normal. No respiratory distress.  Musculoskeletal: Normal range of motion.  Skin:    General: Skin is warm and dry.     Findings: No erythema or rash.  Neurological:     Mental Status: Alert and oriented to person, place, and time. Mental status is at baseline.  Psychiatric:        Mood and Affect: Mood normal.        Behavior: Behavior normal.    Assessment/Plan: The patient is scheduled for right breast washout, debridement and drain placement with Dr. Ladona Ridgel.  Risks, benefits, and alternatives of procedure discussed, questions answered and consent obtained.    Smoking Status: Non-smoker; Counseling Given?  N/A Last Mammogram: History of right breast cancer status post mastectomy and reconstruction;  Caprini Score: 6; Risk Factors include: Age, BMI > 25, history of malignancy, and length of planned surgery. Recommendation for mechanical prophylaxis. Encourage early ambulation.   Pictures obtained: Today  Post-op Rx sent to pharmacy: Oxycodone, Zofran  Patient reported she takes medication for ADHD.  I recommended she hold this the day of surgery.  Patient also reports she takes supplements and vitamins as well as drinks herbal tea.  I recommended she hold this along with ibuprofen 1  week prior to surgery.  Patient expressed understanding.  Patient was provided with the General Surgical Risk consent document and Pain Medication Agreement prior to their appointment.  They had adequate time to read through the risk consent documents and Pain Medication Agreement. We also discussed them in person together during this preop appointment. All of their questions were answered to their satisfaction.  Recommended calling if they have any further questions.  Risk consent form and Pain Medication Agreement to be scanned into patient's chart.  The consent was obtained with risks and complications reviewed which included bleeding, pain, scar, infection and the risk of anesthesia.  The patients questions were answered to the patients expressed satisfaction.    Electronically signed by: Laurena Spies, PA-C 09/17/2022 1:11 PM

## 2022-09-22 ENCOUNTER — Other Ambulatory Visit: Payer: Self-pay

## 2022-09-22 ENCOUNTER — Encounter (HOSPITAL_BASED_OUTPATIENT_CLINIC_OR_DEPARTMENT_OTHER)
Admission: RE | Disposition: A | Discharge status: Home / Self Care | Payer: Self-pay | Source: Home / Self Care | Attending: Plastic Surgery

## 2022-09-22 ENCOUNTER — Ambulatory Visit (HOSPITAL_BASED_OUTPATIENT_CLINIC_OR_DEPARTMENT_OTHER): Payer: Managed Care, Other (non HMO) | Admitting: Certified Registered"

## 2022-09-22 ENCOUNTER — Encounter (HOSPITAL_BASED_OUTPATIENT_CLINIC_OR_DEPARTMENT_OTHER): Payer: Self-pay | Admitting: Plastic Surgery

## 2022-09-22 ENCOUNTER — Ambulatory Visit (HOSPITAL_BASED_OUTPATIENT_CLINIC_OR_DEPARTMENT_OTHER)
Admission: RE | Admit: 2022-09-22 | Discharge: 2022-09-22 | Disposition: A | Discharge status: Home / Self Care | Payer: Managed Care, Other (non HMO) | Attending: Plastic Surgery | Admitting: Plastic Surgery

## 2022-09-22 DIAGNOSIS — F909 Attention-deficit hyperactivity disorder, unspecified type: Secondary | ICD-10-CM | POA: Insufficient documentation

## 2022-09-22 DIAGNOSIS — L7634 Postprocedural seroma of skin and subcutaneous tissue following other procedure: Secondary | ICD-10-CM

## 2022-09-22 DIAGNOSIS — F418 Other specified anxiety disorders: Secondary | ICD-10-CM | POA: Diagnosis not present

## 2022-09-22 DIAGNOSIS — N6489 Other specified disorders of breast: Secondary | ICD-10-CM | POA: Insufficient documentation

## 2022-09-22 DIAGNOSIS — Z08 Encounter for follow-up examination after completed treatment for malignant neoplasm: Secondary | ICD-10-CM | POA: Insufficient documentation

## 2022-09-22 DIAGNOSIS — Z853 Personal history of malignant neoplasm of breast: Secondary | ICD-10-CM | POA: Diagnosis not present

## 2022-09-22 DIAGNOSIS — Z79899 Other long term (current) drug therapy: Secondary | ICD-10-CM | POA: Insufficient documentation

## 2022-09-22 DIAGNOSIS — Z01818 Encounter for other preprocedural examination: Secondary | ICD-10-CM

## 2022-09-22 HISTORY — PX: INCISION AND DRAINAGE OF WOUND: SHX1803

## 2022-09-22 SURGERY — IRRIGATION AND DEBRIDEMENT WOUND
Anesthesia: General | Site: Breast | Laterality: Right

## 2022-09-22 MED ORDER — BUPIVACAINE-EPINEPHRINE 0.25% -1:200000 IJ SOLN
INTRAMUSCULAR | Status: DC | PRN
  Administered 2022-09-22: 20 mL

## 2022-09-22 MED ORDER — DEXAMETHASONE SODIUM PHOSPHATE 4 MG/ML IJ SOLN
INTRAMUSCULAR | Status: DC | PRN
  Administered 2022-09-22: 5 mg via INTRAVENOUS

## 2022-09-22 MED ORDER — LIDOCAINE 2% (20 MG/ML) 5 ML SYRINGE
INTRAMUSCULAR | Status: AC
  Filled 2022-09-22: qty 5

## 2022-09-22 MED ORDER — PHENYLEPHRINE HCL (PRESSORS) 10 MG/ML IV SOLN
INTRAVENOUS | Status: DC | PRN
  Administered 2022-09-22 (×2): 160 ug via INTRAVENOUS

## 2022-09-22 MED ORDER — ROCURONIUM BROMIDE 100 MG/10ML IV SOLN
INTRAVENOUS | Status: DC | PRN
  Administered 2022-09-22: 60 mg via INTRAVENOUS

## 2022-09-22 MED ORDER — HYDROMORPHONE HCL 1 MG/ML IJ SOLN
INTRAMUSCULAR | Status: AC
  Filled 2022-09-22: qty 0.5

## 2022-09-22 MED ORDER — ROCURONIUM BROMIDE 10 MG/ML (PF) SYRINGE
PREFILLED_SYRINGE | INTRAVENOUS | Status: AC
  Filled 2022-09-22: qty 10

## 2022-09-22 MED ORDER — FENTANYL CITRATE (PF) 100 MCG/2ML IJ SOLN
INTRAMUSCULAR | Status: AC
  Filled 2022-09-22: qty 2

## 2022-09-22 MED ORDER — FENTANYL CITRATE (PF) 100 MCG/2ML IJ SOLN
INTRAMUSCULAR | Status: DC | PRN
  Administered 2022-09-22: 50 ug via INTRAVENOUS
  Administered 2022-09-22 (×2): 25 ug via INTRAVENOUS
  Administered 2022-09-22: 100 ug via INTRAVENOUS
  Administered 2022-09-22: 50 ug via INTRAVENOUS

## 2022-09-22 MED ORDER — ONDANSETRON HCL 4 MG/2ML IJ SOLN
INTRAMUSCULAR | Status: AC
  Filled 2022-09-22: qty 2

## 2022-09-22 MED ORDER — ONDANSETRON HCL 4 MG/2ML IJ SOLN
4.0000 mg | Freq: Once | INTRAMUSCULAR | Status: AC | PRN
  Administered 2022-09-22: 4 mg via INTRAVENOUS

## 2022-09-22 MED ORDER — DEXAMETHASONE SODIUM PHOSPHATE 10 MG/ML IJ SOLN
INTRAMUSCULAR | Status: AC
  Filled 2022-09-22: qty 1

## 2022-09-22 MED ORDER — OXYCODONE HCL 5 MG/5ML PO SOLN: 5.0000 mg | Freq: Once | ORAL | Status: AC | PRN

## 2022-09-22 MED ORDER — HYDROMORPHONE HCL 1 MG/ML IJ SOLN
0.2500 mg | INTRAMUSCULAR | Status: DC | PRN
  Administered 2022-09-22 (×2): 0.5 mg via INTRAVENOUS

## 2022-09-22 MED ORDER — PROPOFOL 10 MG/ML IV BOLUS
INTRAVENOUS | Status: AC
  Filled 2022-09-22: qty 20

## 2022-09-22 MED ORDER — 0.9 % SODIUM CHLORIDE (POUR BTL) OPTIME
TOPICAL | Status: DC | PRN
  Administered 2022-09-22: 1000 mL

## 2022-09-22 MED ORDER — KETOROLAC TROMETHAMINE 30 MG/ML IJ SOLN: 30.0000 mg | Freq: Once | INTRAMUSCULAR | Status: DC | PRN

## 2022-09-22 MED ORDER — BUPIVACAINE-EPINEPHRINE (PF) 0.25% -1:200000 IJ SOLN
INTRAMUSCULAR | Status: AC
  Filled 2022-09-22: qty 30

## 2022-09-22 MED ORDER — CHLORHEXIDINE GLUCONATE CLOTH 2 % EX PADS: 6.0000 | MEDICATED_PAD | Freq: Once | CUTANEOUS | Status: DC

## 2022-09-22 MED ORDER — AMISULPRIDE (ANTIEMETIC) 5 MG/2ML IV SOLN
10.0000 mg | Freq: Once | INTRAVENOUS | Status: AC
  Administered 2022-09-22: 10 mg via INTRAVENOUS

## 2022-09-22 MED ORDER — AMISULPRIDE (ANTIEMETIC) 5 MG/2ML IV SOLN
INTRAVENOUS | Status: AC
  Filled 2022-09-22: qty 4

## 2022-09-22 MED ORDER — LIDOCAINE HCL (CARDIAC) PF 100 MG/5ML IV SOSY
PREFILLED_SYRINGE | INTRAVENOUS | Status: DC | PRN
  Administered 2022-09-22: 100 mg via INTRAVENOUS

## 2022-09-22 MED ORDER — LACTATED RINGERS IV SOLN: INTRAVENOUS | Status: DC

## 2022-09-22 MED ORDER — SUGAMMADEX SODIUM 200 MG/2ML IV SOLN
INTRAVENOUS | Status: DC | PRN
  Administered 2022-09-22: 150 mg via INTRAVENOUS

## 2022-09-22 MED ORDER — ONDANSETRON HCL 4 MG/2ML IJ SOLN
INTRAMUSCULAR | Status: DC | PRN
  Administered 2022-09-22: 4 mg via INTRAVENOUS

## 2022-09-22 MED ORDER — CEFAZOLIN SODIUM-DEXTROSE 2-3 GM-%(50ML) IV SOLR
INTRAVENOUS | Status: DC | PRN
  Administered 2022-09-22: 2 g via INTRAVENOUS

## 2022-09-22 MED ORDER — PROPOFOL 10 MG/ML IV BOLUS
INTRAVENOUS | Status: DC | PRN
  Administered 2022-09-22: 150 mg via INTRAVENOUS

## 2022-09-22 MED ORDER — MIDAZOLAM HCL 2 MG/2ML IJ SOLN
INTRAMUSCULAR | Status: AC
  Filled 2022-09-22: qty 2

## 2022-09-22 MED ORDER — OXYCODONE HCL 5 MG PO TABS
5.0000 mg | ORAL_TABLET | Freq: Once | ORAL | Status: AC | PRN
  Administered 2022-09-22: 5 mg via ORAL

## 2022-09-22 MED ORDER — MIDAZOLAM HCL 5 MG/5ML IJ SOLN
INTRAMUSCULAR | Status: DC | PRN
  Administered 2022-09-22: 2 mg via INTRAVENOUS

## 2022-09-22 MED ORDER — OXYCODONE HCL 5 MG PO TABS
ORAL_TABLET | ORAL | Status: AC
  Filled 2022-09-22: qty 1

## 2022-09-22 SURGICAL SUPPLY — 71 items
ADH SKN CLS APL DERMABOND .7 (GAUZE/BANDAGES/DRESSINGS) ×2
APL SKNCLS STERI-STRIP NONHPOA (GAUZE/BANDAGES/DRESSINGS)
BENZOIN TINCTURE PRP APPL 2/3 (GAUZE/BANDAGES/DRESSINGS) IMPLANT
BINDER BREAST XLRG (GAUZE/BANDAGES/DRESSINGS) IMPLANT
BIOPATCH RED 1 DISK 7.0 (GAUZE/BANDAGES/DRESSINGS) IMPLANT
BLADE CLIPPER SURG (BLADE) IMPLANT
BLADE SURG 10 STRL SS (BLADE) IMPLANT
BLADE SURG 15 STRL LF DISP TIS (BLADE) ×1 IMPLANT
BLADE SURG 15 STRL SS (BLADE) ×1
CANISTER SUCT 1200ML W/VALVE (MISCELLANEOUS) IMPLANT
COVER BACK TABLE 60X90IN (DRAPES) ×1 IMPLANT
COVER MAYO STAND STRL (DRAPES) ×1 IMPLANT
DERMABOND ADVANCED .7 DNX12 (GAUZE/BANDAGES/DRESSINGS) IMPLANT
DRAIN CHANNEL 15F RND FF W/TCR (WOUND CARE) IMPLANT
DRAIN CHANNEL 19F RND (DRAIN) IMPLANT
DRAPE LAPAROTOMY 100X72 PEDS (DRAPES) IMPLANT
DRAPE U-SHAPE 76X120 STRL (DRAPES) IMPLANT
DRAPE UTILITY XL STRL (DRAPES) ×1 IMPLANT
DRSG TEGADERM 4X4.75 (GAUZE/BANDAGES/DRESSINGS) IMPLANT
DRSG TELFA 3X8 NADH STRL (GAUZE/BANDAGES/DRESSINGS) IMPLANT
ELECT COATED BLADE 2.86 ST (ELECTRODE) IMPLANT
ELECT NDL BLADE 2-5/6 (NEEDLE) ×1 IMPLANT
ELECT NEEDLE BLADE 2-5/6 (NEEDLE) ×1 IMPLANT
ELECT REM PT RETURN 9FT ADLT (ELECTROSURGICAL) ×1
ELECT REM PT RETURN 9FT PED (ELECTROSURGICAL) ×1
ELECTRODE REM PT RETRN 9FT PED (ELECTROSURGICAL) IMPLANT
ELECTRODE REM PT RTRN 9FT ADLT (ELECTROSURGICAL) IMPLANT
EVACUATOR SILICONE 100CC (DRAIN) IMPLANT
GAUZE SPONGE 4X4 12PLY STRL LF (GAUZE/BANDAGES/DRESSINGS) IMPLANT
GAUZE XEROFORM 1X8 LF (GAUZE/BANDAGES/DRESSINGS) IMPLANT
GLOVE BIO SURGEON STRL SZ8 (GLOVE) ×1 IMPLANT
GLOVE BIOGEL M STRL SZ7.5 (GLOVE) ×1 IMPLANT
GLOVE BIOGEL PI IND STRL 7.5 (GLOVE) ×1 IMPLANT
GOWN STRL REUS W/ TWL LRG LVL3 (GOWN DISPOSABLE) ×2 IMPLANT
GOWN STRL REUS W/TWL LRG LVL3 (GOWN DISPOSABLE) ×1
HEMOSTAT ARISTA ABSORB 3G PWDR (HEMOSTASIS) IMPLANT
HIBICLENS CHG 4% 4OZ BTL (MISCELLANEOUS) ×1 IMPLANT
NDL HYPO 25GX1X1/2 BEV (NEEDLE) IMPLANT
NDL HYPO 25X1 1.5 SAFETY (NEEDLE) IMPLANT
NDL SPNL 18GX3.5 QUINCKE PK (NEEDLE) IMPLANT
NEEDLE HYPO 25GX1X1/2 BEV (NEEDLE) ×1 IMPLANT
NEEDLE HYPO 25X1 1.5 SAFETY (NEEDLE) ×1 IMPLANT
NEEDLE SPNL 18GX3.5 QUINCKE PK (NEEDLE) IMPLANT
NS IRRIG 1000ML POUR BTL (IV SOLUTION) IMPLANT
PACK BASIN DAY SURGERY FS (CUSTOM PROCEDURE TRAY) ×1 IMPLANT
PACK UNIVERSAL I (CUSTOM PROCEDURE TRAY) IMPLANT
PENCIL SMOKE EVACUATOR (MISCELLANEOUS) ×1 IMPLANT
SHEET MEDIUM DRAPE 40X70 STRL (DRAPES) IMPLANT
SLEEVE SCD COMPRESS KNEE MED (STOCKING) IMPLANT
SPONGE T-LAP 18X18 ~~LOC~~+RFID (SPONGE) IMPLANT
STAPLER VISISTAT 35W (STAPLE) ×1 IMPLANT
STRIP CLOSURE SKIN 1/2X4 (GAUZE/BANDAGES/DRESSINGS) IMPLANT
SUT ETHILON 4 0 PS 2 18 (SUTURE) IMPLANT
SUT MNCRL AB 3-0 PS2 27 (SUTURE) IMPLANT
SUT MNCRL AB 4-0 PS2 18 (SUTURE) IMPLANT
SUT PDS 3-0 CT2 (SUTURE)
SUT PDS II 3-0 CT2 27 ABS (SUTURE) IMPLANT
SUT SILK 2 0 SH (SUTURE) IMPLANT
SUT VIC AB 3-0 PS1 18 (SUTURE) ×1
SUT VIC AB 3-0 PS1 18XBRD (SUTURE) IMPLANT
SUT VIC AB 4-0 PS2 18 (SUTURE) IMPLANT
SUT VLOC 90 P-14 23 (SUTURE) IMPLANT
SWAB COLLECTION DEVICE MRSA (MISCELLANEOUS) IMPLANT
SWAB CULTURE ESWAB REG 1ML (MISCELLANEOUS) IMPLANT
SYR BULB EAR ULCER 3OZ GRN STR (SYRINGE) IMPLANT
SYR CONTROL 10ML LL (SYRINGE) ×1 IMPLANT
TOWEL GREEN STERILE FF (TOWEL DISPOSABLE) ×1 IMPLANT
TRAY DSU PREP LF (CUSTOM PROCEDURE TRAY) IMPLANT
TUBE CONNECTING 20X1/4 (TUBING) ×1 IMPLANT
TUBING INFILTRATION IT-10001 (TUBING) IMPLANT
YANKAUER SUCT BULB TIP NO VENT (SUCTIONS) ×1 IMPLANT

## 2022-09-22 NOTE — Interval H&P Note (Signed)
History and Physical Interval Note: Pt met in the holding area, no change in physical exam or indication for surgery Surgical site marked and all questions answered. Will proceed with management of chronic seroma at her request   09/22/2022 11:00 AM  Robin Farley  has presented today for surgery, with the diagnosis of Seroma of breast.  The various methods of treatment have been discussed with the patient and family. After consideration of risks, benefits and other options for treatment, the patient has consented to  Procedure(s): right breast washout, debridement, drain placement (Right) as a surgical intervention.  The patient's history has been reviewed, patient examined, no change in status, stable for surgery.  I have reviewed the patient's chart and labs.  Questions were answered to the patient's satisfaction.     Santiago Glad

## 2022-09-22 NOTE — Anesthesia Preprocedure Evaluation (Addendum)
Anesthesia Evaluation  Patient identified by MRN, date of birth, ID band Patient awake    Reviewed: Allergy & Precautions, H&P , NPO status , Patient's Chart, lab work & pertinent test results  Airway Mallampati: II  TM Distance: >3 FB Neck ROM: Full    Dental no notable dental hx. (+) Teeth Intact, Dental Advisory Given   Pulmonary neg pulmonary ROS   Pulmonary exam normal breath sounds clear to auscultation       Cardiovascular negative cardio ROS Normal cardiovascular exam Rhythm:Regular Rate:Normal  EF 64%   Neuro/Psych  PSYCHIATRIC DISORDERS Anxiety Depression    negative neurological ROS  negative psych ROS   GI/Hepatic negative GI ROS, Neg liver ROS,GERD  ,,  Endo/Other  negative endocrine ROS    Renal/GU negative Renal ROS  negative genitourinary   Musculoskeletal negative musculoskeletal ROS (+)    Abdominal   Peds negative pediatric ROS (+)  Hematology negative hematology ROS (+)   Anesthesia Other Findings R breast CA s/p masectomy rads All: Promethazine  Reproductive/Obstetrics negative OB ROS                             Anesthesia Physical Anesthesia Plan  ASA: 2  Anesthesia Plan: General   Post-op Pain Management:    Induction: Intravenous  PONV Risk Score and Plan: 3 and Treatment may vary due to age or medical condition, Midazolam, Dexamethasone and Ondansetron  Airway Management Planned: Oral ETT  Additional Equipment: None  Intra-op Plan:   Post-operative Plan: Extubation in OR  Informed Consent: I have reviewed the patients History and Physical, chart, labs and discussed the procedure including the risks, benefits and alternatives for the proposed anesthesia with the patient or authorized representative who has indicated his/her understanding and acceptance.     Dental advisory given  Plan Discussed with:   Anesthesia Plan Comments:         Anesthesia Quick Evaluation

## 2022-09-22 NOTE — Discharge Instructions (Addendum)
INSTRUCTIONS FOR AFTER BREAST SURGERY   You will likely have some questions about what to expect following your operation.  The following information will help you and your family understand what to expect when you are discharged from the hospital.  It is important to follow these guidelines to help ensure a smooth recovery and reduce complication.  Postoperative instructions include information on: diet, wound care, medications and physical activity.  AFTER SURGERY Expect to go home after the procedure.  In some cases, you may need to spend one night in the hospital for observation.  DIET Breast surgery does not require a specific diet.  However, the healthier you eat the better your body will heal. It is important to increasing your protein intake.  This means limiting the foods with sugar and carbohydrates.  Focus on vegetables and some meat.  If you have liposuction during your procedure be sure to drink water.  If your urine is bright yellow, then it is concentrated, and you need to drink more water.  As a general rule after surgery, you should have 8 ounces of water every hour while awake.  If you find you are persistently nauseated or unable to take in liquids let us know.  NO TOBACCO USE or EXPOSURE.  This will slow your healing process and lead to a wound.  WOUND CARE Leave the binder on at all times except when showering . Use fragrance free soap like Dial, Dove or [place].   After 24 hours you can remove the binder to shower. Once dry apply binder or sports bra. No baths, pools or hot tubs for four weeks. We close your incision to leave the smallest and best-looking scar. No ointment or creams on your incisions for four weeks.  No Neosporin (Too many skin reactions).  A few weeks after surgery you can use Mederma and start massaging the scar. We ask you to wear your binder or sports bra for the first 6 weeks around the clock, including while sleeping. This provides added comfort and helps  reduce the fluid accumulation at the surgery site. NO Ice or heating pads to the operative site.  You have a very high risk of a BURN before you feel the temperature change. Continue to empty, recharge, & record drainage from drains 2-3 times a day, as needed.  ACTIVITY No heavy lifting until cleared by the doctor.  This usually means no more than a half-gallon of milk.  It is OK to walk and climb stairs. Moving your legs is very important to decrease your risk of a blood clot.  It will also help keep you from getting deconditioned.  Every 1 to 2 hours get up and walk for 5 minutes. This will help with a quicker recovery back to normal.  Let pain be your guide so you don't do too much.  This time is for you to recover.  You will be more comfortable if you sleep and rest with your head elevated either with a few pillows under you or in a recliner.  No stomach sleeping for a three months.  WORK Everyone returns to work at different times. As a rough guide, most people take at least 1 - 2 weeks off prior to returning to work. If you need documentation for your job, give the forms to the front staff at the clinic.  DRIVING Arrange for someone to bring you home from the hospital after your surgery.  You may be able to drive a few days after  surgery but not while taking any narcotics or valium.  BOWEL MOVEMENTS Constipation can occur after anesthesia and while taking pain medication.  It is important to stay ahead for your comfort.  We recommend taking Milk of Magnesia (2 tablespoons; twice a day) while taking the pain pills.  MEDICATIONS You may be prescribed should start after surgery At your preoperative visit for you history and physical you may have been given the following medications: Zofran 4 mg:  This is to treat nausea and vomiting.  You can take this every 6 hours as needed and only if needed. Oxycodone 5 mg:  This is only to be used after you have taken the Motrin or the Tylenol. Every 8  hours as needed.   Over the counter Medication to take: Ibuprofen (Motrin) 600 mg:  Take this every 6 hours.  If you have additional pain then take 500 mg of the Tylenol every 8 hours.  Only take the Norco after you have tried these two. MiraLAX or Milk of Magnesia: Take this according to the bottle if you take the Norco.  WHEN TO CALL Call your surgeon's office if any of the following occur: Fever 101 degrees F or greater Excessive bleeding or fluid from the incision site. Pain that increases over time without aid from the medications Redness, warmth, or pus draining from incision sites Persistent nausea or inability to take in liquids Severe misshapen area that underwent the operation.  Here are some resources for breast cancer patients:  Plastic surgery website: https://www.plasticsurgery.org/for-medical-professionals/education-and-resources/publications/breast-reconstruction-magazine Breast Reconstruction Awareness Campaign:  ChessContest.fr Plastic surgery Implant information:  https://www.plasticsurgery.org/patient-safety/breast-implant-safety  About my Jackson-Pratt Bulb Drain  What is a Jackson-Pratt bulb? A Jackson-Pratt is a soft, round device used to collect drainage. It is connected to a long, thin drainage catheter, which is held in place by one or two small stiches near your surgical incision site. When the bulb is squeezed, it forms a vacuum, forcing the drainage to empty into the bulb.  Emptying the Jackson-Pratt bulb- To empty the bulb: 1. Release the plug on the top of the bulb. 2. Pour the bulb's contents into a measuring container which your nurse will provide. 3. Record the time emptied and amount of drainage. Empty the drain(s) as often as your     doctor or nurse recommends.  Date                  Time                    Amount (Drain 1)                 Amount (Drain  2)  _____________________________________________________________________  _____________________________________________________________________  _____________________________________________________________________  _____________________________________________________________________  _____________________________________________________________________  _____________________________________________________________________  _____________________________________________________________________  _____________________________________________________________________  Squeezing the Jackson-Pratt Bulb- To squeeze the bulb: 1. Make sure the plug at the top of the bulb is open. 2. Squeeze the bulb tightly in your fist. You will hear air squeezing from the bulb. 3. Replace the plug while the bulb is squeezed. 4. Use a safety pin to attach the bulb to your clothing. This will keep the catheter from     pulling at the bulb insertion site.  When to call your doctor- Call your doctor if: Drain site becomes red, swollen or hot. You have a fever greater than 101 degrees F. There is oozing at the drain site. Drain falls out (apply a guaze bandage over the drain hole and secure it with tape). Drainage increases  daily not related to activity patterns. (You will usually have more drainage when you are active than when you are resting.) Drainage has a bad odor.   Post Anesthesia Home Care Instructions  Activity: Get plenty of rest for the remainder of the day. A responsible individual must stay with you for 24 hours following the procedure.  For the next 24 hours, DO NOT: -Drive a car -Advertising copywriter -Drink alcoholic beverages -Take any medication unless instructed by your physician -Make any legal decisions or sign important papers.  Meals: Start with liquid foods such as gelatin or soup. Progress to regular foods as tolerated. Avoid greasy, spicy, heavy foods. If nausea and/or  vomiting occur, drink only clear liquids until the nausea and/or vomiting subsides. Call your physician if vomiting continues.  Special Instructions/Symptoms: Your throat may feel dry or sore from the anesthesia or the breathing tube placed in your throat during surgery. If this causes discomfort, gargle with warm salt water. The discomfort should disappear within 24 hours.  If you had a scopolamine patch placed behind your ear for the management of post- operative nausea and/or vomiting:  1. The medication in the patch is effective for 72 hours, after which it should be removed.  Wrap patch in a tissue and discard in the trash. Wash hands thoroughly with soap and water. 2. You may remove the patch earlier than 72 hours if you experience unpleasant side effects which may include dry mouth, dizziness or visual disturbances. 3. Avoid touching the patch. Wash your hands with soap and water after contact with the patch.

## 2022-09-22 NOTE — Progress Notes (Signed)
Patient complained of nausea around 1600 this nurse called the anesthiologist at 1601, Dr Richardson Landry gave verbal to give 10mg  of barhemsys IV  for a one time dose, administered  at 1603 IV, at 1613 patient stated nausea was better and she was ready to go home, this nurse pulled IV out, and patient got dressed with the help of her husband at 1420 patient was wheeled out to private vehicle

## 2022-09-22 NOTE — Op Note (Signed)
DATE OF OPERATION: 09/22/2022  LOCATION: [facility] operating Room  PREOPERATIVE DIAGNOSIS: Chronic seroma  POSTOPERATIVE DIAGNOSIS: Same  PROCEDURE: Drainage of seroma, debridement of seroma cavity, placement of drain  SURGEON: Loren Racer, MD  ASSISTANT: Caroline More, primary, Matt Scheeler  EBL: 25 cc  CONDITION: Stable  COMPLICATIONS: None  INDICATION: The patient, Robin Farley, is a 57 y.o. 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   PROCEDURE DETAILS:  The patient was seen prior to surgery and marked.   IV antibiotics were given. The patient was taken to the operating room and given a general anesthetic. A standard time out was performed and all information was confirmed by those in the room. SCDs were placed.   The chest was prepped and draped in usual sterile manner.  The previous scar was incised and the serous fluid removed.  Aerobic and anaerobic cultures were obtained.  On inspecting the cavity the previously placed acellular dermal matrix was readily visible.  This was excised using a combination of sharp and blunt dissection and electrocautery.  The entire portion of ADM was removed.  The wound was then inspected for bleeding and hemostasis was achieved with the electrocautery.  The wound was irrigated with saline.  The pectoralis muscle was infiltrated with quarter percent Marcaine with epinephrine.  The surgical cavity was coated with Arista hemostatic agent.  A 19 French round drain was placed through a separate stab incision.  The dermis was reapproximated with interrupted 3-0 Monocryl sutures and the skin closed with a running 4-0 Monocryl subcuticular stitch.  The skin edges were sealed with Dermabond.  The patient was placed in a supportive garment and awakened from anesthesia without incident.  She was transferred to the recovery room in stable condition.  All instrument needle and  sponge counts were reported as correct. The patient was allowed to wake up and taken to recovery room in stable condition at the end of the case. The family was notified at the end of the case.   The advanced practice practitioner (APP) assisted throughout the case.  The APP was essential in retraction and counter traction when needed to make the case progress smoothly.  This retraction and assistance made it possible to see the tissue plans for the procedure.  The assistance was needed for blood control, tissue re-approximation and assisted with closure of the incision site.

## 2022-09-22 NOTE — Anesthesia Procedure Notes (Signed)
Procedure Name: Intubation Date/Time: 09/22/2022 1:15 PM  Performed by: Karen Kitchens, CRNAPre-anesthesia Checklist: Patient identified, Emergency Drugs available, Suction available and Patient being monitored Patient Re-evaluated:Patient Re-evaluated prior to induction Oxygen Delivery Method: Circle system utilized Preoxygenation: Pre-oxygenation with 100% oxygen Induction Type: IV induction Ventilation: Mask ventilation without difficulty Laryngoscope Size: Mac and 4 Grade View: Grade II Tube type: Oral Tube size: 7.0 mm Number of attempts: 1 Airway Equipment and Method: Stylet and Oral airway Placement Confirmation: ETT inserted through vocal cords under direct vision, positive ETCO2, breath sounds checked- equal and bilateral and CO2 detector Secured at: 22 cm Tube secured with: Tape Dental Injury: Teeth and Oropharynx as per pre-operative assessment

## 2022-09-23 ENCOUNTER — Encounter (HOSPITAL_BASED_OUTPATIENT_CLINIC_OR_DEPARTMENT_OTHER): Payer: Self-pay | Admitting: Plastic Surgery

## 2022-09-23 NOTE — Anesthesia Postprocedure Evaluation (Signed)
Anesthesia Post Note  Patient: Robin Farley  Procedure(s) Performed: right breast washout, debridement, drain placement (Right: Breast)     Patient location during evaluation: PACU Anesthesia Type: General Level of consciousness: awake and alert Pain management: pain level controlled Vital Signs Assessment: post-procedure vital signs reviewed and stable Respiratory status: spontaneous breathing, nonlabored ventilation, respiratory function stable and patient connected to nasal cannula oxygen Cardiovascular status: blood pressure returned to baseline and stable Postop Assessment: no apparent nausea or vomiting Anesthetic complications: no   No notable events documented.  Last Vitals:  Vitals:   09/22/22 1530 09/22/22 1613  BP: 132/76 123/81  Pulse: 73 74  Resp: 11 15  Temp:  36.7 C  SpO2: 97% 94%    Last Pain:  Vitals:   09/22/22 1613  TempSrc: Temporal  PainSc: 3                  Trevor Iha

## 2022-09-23 NOTE — Transfer of Care (Signed)
Immediate Anesthesia Transfer of Care Note  Patient: Robin Farley  Procedure(s) Performed: right breast washout, debridement, drain placement (Right: Breast)  Patient Location: PACU  Anesthesia Type:General  Level of Consciousness: awake and patient cooperative  Airway & Oxygen Therapy: Patient Spontanous Breathing and Patient connected to face mask oxygen  Post-op Assessment: Report given to RN and Post -op Vital signs reviewed and stable  Post vital signs: Reviewed and stable  Last Vitals:  Vitals Value Taken Time  BP 123/81 09/22/22 1613  Temp 36.7 C 09/22/22 1613  Pulse 74 09/22/22 1613  Resp 15 09/22/22 1613  SpO2 94 % 09/22/22 1613    Last Pain:  Vitals:   09/22/22 1613  TempSrc: Temporal  PainSc: 3       Patients Stated Pain Goal: 3 (09/22/22 1100)  Complications: No notable events documented.

## 2022-09-24 LAB — AEROBIC/ANAEROBIC CULTURE W GRAM STAIN (SURGICAL/DEEP WOUND)

## 2022-09-25 LAB — AEROBIC/ANAEROBIC CULTURE W GRAM STAIN (SURGICAL/DEEP WOUND)

## 2022-09-27 LAB — AEROBIC/ANAEROBIC CULTURE W GRAM STAIN (SURGICAL/DEEP WOUND)

## 2022-09-28 ENCOUNTER — Encounter: Payer: Self-pay | Admitting: Plastic Surgery

## 2022-09-28 ENCOUNTER — Ambulatory Visit: Payer: Managed Care, Other (non HMO) | Admitting: Plastic Surgery

## 2022-09-28 VITALS — BP 126/83 | HR 80 | Ht 66.0 in | Wt 186.0 lb

## 2022-09-28 DIAGNOSIS — N6489 Other specified disorders of breast: Secondary | ICD-10-CM

## 2022-09-28 NOTE — Progress Notes (Signed)
Ms. Riano returns approximately 1 week postop after debridement of mastectomy site washout and drain placement for a chronic seroma.  She states she is currently doing well however she had a significant amount of nausea and vomiting postoperatively along with pain from the debridement.  On examination her incision is clean dry and intact.  There is minimal erythema.  There is no palpable fluid.  The drain is in place and functioning properly.  Based on discussion with the patient and her husband her drain output is still greater than 30 mL over 24 hours.  We discussed the fact that the drain should be left in place until it is clearly below the 30 mL per 24-hour threshold.  She may slowly increase activity.  She may return to work whenever she feels comfortable.  She will return on Wednesday or Thursday if the drain output consistently is below 30 mL over 24 hours.  Otherwise she will return to see me next week.

## 2022-10-05 ENCOUNTER — Ambulatory Visit: Payer: Managed Care, Other (non HMO) | Attending: Surgery

## 2022-10-05 VITALS — Wt 169.5 lb

## 2022-10-05 DIAGNOSIS — Z483 Aftercare following surgery for neoplasm: Secondary | ICD-10-CM | POA: Insufficient documentation

## 2022-10-05 NOTE — Therapy (Signed)
OUTPATIENT PHYSICAL THERAPY SOZO SCREENING NOTE   Patient Name: Robin Farley MRN: [id number] DOB:[birth year], 57 y.o., female Today's Date: 10/05/2022  PCP: Corwin Levins, MD REFERRING PROVIDER: Manus Rudd, MD   PT End of Session - 10/05/22 1601     Visit Number 2   # unchanged due to screen only   PT Start Time 1555    PT Stop Time 1603    PT Time Calculation (min) 8 min    Activity Tolerance Patient tolerated treatment well    Behavior During Therapy [facility] for tasks assessed/performed             Past Medical History:  Diagnosis Date   Abdominal pain, generalized 12/27/2008   Acute sinusitis, unspecified 02/26/2007   ADD 03/13/2010   ALLERGIC RHINITIS 02/23/2007   ANEMIA 02/05/2007   Anxiety 11/17/2011   ATTENTION DEFICIT DISORDER, HX OF 02/05/2007   Breast cancer (HCC)    Deviated nasal septum 02/05/2007   Family history of breast cancer 11/20/2020   Family history of melanoma 11/20/2020   FATIGUE 02/23/2007   HYPERLIPIDEMIA 02/23/2007   Personal history of malignant melanoma 11/20/2020   Port-A-Cath in place 12/03/2020   SWELLING MASS OR LUMP IN HEAD AND NECK 07/27/2008   UTI 02/23/2007   Past Surgical History:  Procedure Laterality Date   BREAST IMPLANT REMOVAL Right 07/27/2022   Procedure: Removal of right breast implant, washout, drain placemen;  Surgeon: Santiago Glad, MD;  Location: [facility] OR;  Service: Plastics;  Laterality: Right;   BREAST RECONSTRUCTION WITH PLACEMENT OF TISSUE EXPANDER AND FLEX HD (ACELLULAR HYDRATED DERMIS) Right 05/08/2021   Procedure: BREAST RECONSTRUCTION WITH PLACEMENT OF TISSUE EXPANDER AND FLEX HD (ACELLULAR HYDRATED DERMIS);  Surgeon: Allena Napoleon, MD;  Location: [facility];  Service: Plastics;  Laterality: Right;   BROW LIFT     CESAREAN SECTION     COSMETIC SURGERY  1995   Nasal   INCISION AND DRAINAGE OF WOUND Right 09/22/2022   Procedure: right breast washout, debridement, drain placement;  Surgeon:  Santiago Glad, MD;  Location: [facility];  Service: Plastics;  Laterality: Right;   MASTECTOMY W/ SENTINEL NODE BIOPSY Right 05/08/2021   Procedure: RIGHT MASTECTOMY WITH SENTINEL LYMPH NODE BIOPSY;  Surgeon: Manus Rudd, MD;  Location: [facility];  Service: General;  Laterality: Right;   MELANOMA EXCISION WITH SENTINEL LYMPH NODE BIOPSY Left 10/24/2012   Procedure: MELANOMA wide EXCISION left lateral thigh WITH SENTINEL LYMPH NODE BIOPSY left groin;  Surgeon: Mariella Saa, MD;  Location: [facility];  Service: General;  Laterality: Left;   NASAL SEPTUM SURGERY     PORTACATH PLACEMENT Right 12/02/2020   Procedure: INSERTION PORT-A-CATH;  Surgeon: Manus Rudd, MD;  Location: [facility];  Service: General;  Laterality: Right;   RADIOACTIVE SEED GUIDED AXILLARY SENTINEL LYMPH NODE Right 05/08/2021   Procedure: RADIOACTIVE SEED GUIDED AXILLARY SENTINEL LYMPH NODE DISSECTION;  Surgeon: Manus Rudd, MD;  Location: [facility];  Service: General;  Laterality: Right;   REMOVAL OF TISSUE EXPANDER AND PLACEMENT OF IMPLANT Right 05/05/2022   Procedure: REMOVAL OF TISSUE EXPANDER AND PLACEMENT OF IMPLANT;  Surgeon: Santiago Glad, MD;  Location:  SURGERY CENTER;  Service: Plastics;  Laterality: Right;   Patient Active Problem List   Diagnosis Date Noted   Elevated LFTs 12/10/2020   Genetic testing 12/06/2020   Family history of breast cancer 11/20/2020   Personal history of malignant  melanoma 11/20/2020   Family history of melanoma 11/20/2020   Malignant neoplasm of upper-outer quadrant of right breast in female, estrogen receptor negative (HCC) 11/12/2020   Vitamin D deficiency 06/02/2019   Hyperglycemia 06/02/2019   Blood pressure elevated without history of HTN 06/02/2019   Headache 12/16/2018   Urinary incontinence, urge 12/14/2017   Calcific bursitis of shoulder 07/10/2015   Nausea alone  04/27/2013   Melanoma of thigh (HCC) 10/06/2012   Anxiety 11/17/2011   Rosacea 07/08/2011   Right shoulder pain 07/08/2011   Knee effusion, left 08/18/2010   Encounter for well adult exam with abnormal findings 08/18/2010   Attention deficit disorder 03/13/2010   GERD 12/27/2008   Hyperlipidemia 02/23/2007   DEPRESSION 02/23/2007   ALLERGIC RHINITIS 02/23/2007   FATIGUE 02/23/2007   ANEMIA 02/05/2007   Deviated nasal septum 02/05/2007    REFERRING DIAG: right breast cancer at risk for lymphedema  THERAPY DIAG: Aftercare following surgery for neoplasm  PERTINENT HISTORY: 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  She had neoadjuvant chemo, and had  a right mastectomy with immediate expander on 05/08/2021. She will be having radiation.   PRECAUTIONS: right UE Lymphedema risk, None  SUBJECTIVE: Pt returns for her 3 month L-Dex screen. "I had to have my implant removed last week due to infection."   PAIN:  Are you having pain? No  SOZO SCREENING: Patient was assessed today using the SOZO machine to determine the lymphedema index score. This was compared to her baseline score. It was determined that she is within the recommended range when compared to her baseline and no further action is needed at this time. She will continue SOZO screenings. These are done every 3 months for 2 years post operatively followed by every 6 months for 2 years, and then annually.   L-DEX FLOWSHEETS - 10/05/22 1600       L-DEX LYMPHEDEMA SCREENING   Measurement Type Unilateral    L-DEX MEASUREMENT EXTREMITY Upper Extremity    POSITION  Standing    DOMINANT SIDE Right    At Risk Side Right    BASELINE SCORE (UNILATERAL) 0.9    L-DEX SCORE (UNILATERAL) -2.8    VALUE CHANGE (UNILAT) -3.7              Hermenia Bers, PTA 10/05/2022, 4:02 PM

## 2022-10-07 ENCOUNTER — Telehealth: Payer: Self-pay | Admitting: Hematology and Oncology

## 2022-10-07 ENCOUNTER — Encounter: Payer: Self-pay | Admitting: Surgical

## 2022-10-07 ENCOUNTER — Encounter: Payer: Self-pay | Admitting: Plastic Surgery

## 2022-10-07 NOTE — Progress Notes (Signed)
Patient called on-call provider service, patient message said she had redness, and was concerned for infection. Immediately returned patients call within two minutes of receiving message, patient did not answer. Left voicemail message to call the office.

## 2022-10-07 NOTE — Telephone Encounter (Signed)
Called twice, and also called patient contacts, Left a message regarding rescheduled appointment

## 2022-10-07 NOTE — Progress Notes (Signed)
Received additional call from patient stating that she received a call from office with request for callback. On-call provider, myself called patient back, patient did not answer. No voicemail was left.

## 2022-10-08 ENCOUNTER — Telehealth: Payer: Self-pay | Admitting: Plastic Surgery

## 2022-10-08 MED ORDER — CLINDAMYCIN HCL 300 MG PO CAPS: 300.0000 mg | ORAL_CAPSULE | Freq: Three times a day (TID) | ORAL | 0 refills | Status: AC

## 2022-10-08 NOTE — Telephone Encounter (Signed)
Pt called and reports that her R Breast is red, sore and sensitive to the touch.  She has an appt on 10/12/2022 but is not sure if she should be seen sooner.  Please call her at [phone].  She has a meeting at 9am and may not answer, but please leave a message and she will return the call if necessary.

## 2022-10-09 ENCOUNTER — Ambulatory Visit (INDEPENDENT_AMBULATORY_CARE_PROVIDER_SITE_OTHER): Payer: Managed Care, Other (non HMO) | Admitting: Physician Assistant

## 2022-10-09 VITALS — BP 122/81 | HR 84 | Temp 98.2°F | Resp 18

## 2022-10-09 DIAGNOSIS — N6489 Other specified disorders of breast: Secondary | ICD-10-CM

## 2022-10-09 NOTE — Progress Notes (Signed)
Patient is a pleasant 57 year old female with PMH of right-sided breast cancer s/p unilateral mastectomy and radiation with implant-based reconstruction performed 05/05/2022 now s/p removal of implant performed 07/27/2022 and subsequent washout with debridement of ADM and drain placement 09/22/2022 by Dr. Ladona Ridgel who presents to clinic for postoperative follow-up and concern for infection.  She was last seen here in clinic on 09/28/2022 for her initial postoperative encounter.  At that time, no palpable fluid collection and drain was in place and functioning properly.  Drain output was still elevated and decision was made to leave it in place until less than 30 cc per 24-hour period.  Increase activity slowly and as tolerated.  She then called into the on-call service 10/07/2022 reporting that the right breast was sore, tender, and red.  She was prescribed oral clindamycin and encouraged to follow-up in office ASAP.  Today, patient is doing okay.  She is accompanied by her husband at bedside.  They state that the area inferior to her mastectomy incision was particularly red yesterday.  She also reports that drainage had been less than 25 cc/day for a while before spiking to 50 cc/day a few days ago when she started having discomfort and redness.  She states that she switched to using antibacterial soap in addition to initiation of clindamycin and since then she has already seen improvement.  She denies any fevers at home and simply reports that she was having some increased pain and redness as well as a "warmth" locally over the mastectomy site.    On exam, the Tegaderm and Biopatch over the drain tube insertion site has remained in place since her surgery 09/22/2022.  This was removed here today.  The redness was predominantly around the drain tube insertion site and underneath the Tegaderm.  The skin is not indurated, mildly warm to the touch.  Mild erythema, but improved compared to pictures provided from 2 days  ago.  Drain tube intact and functional, normal-appearing output in bulb and tube.  Patient and husband are already reassured by the improvement that they have seen in the past 24 hours.  Vital signs obtained here in clinic are within normal limits.  She denies any fevers at home.  The cause of her redness and increased discomfort is unclear, but concerning for infection or seroma recurrence.  Particularly given the elevated outputs.  Recommending continued antibiotics and close observation.  She understands to call the office immediately should she have any new or worsening symptoms.  She understands that this may require return to the OR.  Plan for close follow-up Monday morning.  Vitals:   10/09/22 0832  BP: 122/81  Pulse: 84  Resp: 18  Temp: 98.2 F (36.8 C)  SpO2: 97%

## 2022-10-12 ENCOUNTER — Ambulatory Visit (INDEPENDENT_AMBULATORY_CARE_PROVIDER_SITE_OTHER): Payer: Managed Care, Other (non HMO) | Admitting: Student

## 2022-10-12 DIAGNOSIS — Z9889 Other specified postprocedural states: Secondary | ICD-10-CM

## 2022-10-12 NOTE — Progress Notes (Signed)
Patient is a pleasant 57 year old female with PMH of right-sided breast cancer s/p unilateral mastectomy and radiation with implant-based reconstruction performed 05/05/2022 now s/p removal of implant performed 07/27/2022 and subsequent washout with debridement of ADM and drain placement 09/22/2022 by Dr. Ladona Ridgel who presents to clinic for postoperative follow-up.     Patient was last seen in the clinic on 10/09/2022 with concerns for infection to her right breast.  She states that the area inferior to her mastectomy incision was particularly red the day prior to her appointment.  Patient reported that her drainage had been less than 25 cc/day for a while before spiking to 50 cc/day a few days prior when she was having discomfort and redness.  Patient was started on clindamycin and stated at this appointment she was already seeing improvement.  She denied any fevers.  She reported some increased pain and redness as well as warmth over the local mastectomy site.  On exam, Tegaderm and Biopatch over the drain insertion site had remained in place since her surgery.  It was removed at the clinic.  The redness was predominantly around the drain insertion site and underneath the Tegaderm.  The skin was not indurated, mildly warm to the touch.  The drain tube was intact and functional with normal-appearing output in the bulb and 2.  Plan was for patient to continue antibiotics and closely monitor the area.  Today, patient presents with her husband at bedside.  She states that she is doing well today.  She feels that the redness to her right breast has improved.  She states she still has 4 days or so left of her clindamycin.  She denies any fevers.  She reports that her drain output has been less than 25 cc over the past 24 hours.  Patient states she also feels she has some limited range of motion to her right upper extremity.  She denies any other issues or concerns at this time.  Chaperone present on exam.  On exam,  patient is sitting upright in no acute distress.  There is some mild erythema near the drain insertion site, but appears to be improved from photo taken at previous exam.  Incision appears to be intact.  No drainage around the incision.  There is some residual Dermabond noted to the incision.  No fluctuance or warmth noted on exam.  JP drain is in place and functioning.  There appears to be some serous drainage in the bulb.  JP drain was removed without difficulty.  Patient tolerated well.  Discussed with the patient it appears her infection is improving.  Discussed with her to finish her antibiotic as directed.  Also discussed that she may apply gauze and tape over her drain site.  Discussed with her she may start applying Vaseline to the drain site in a few days.  Also recommended Vaseline to the incision.  Patient expressed understanding.  Discussed with the patient she should continue to closely monitor the area.  Discussed with her that if she notices any worsening redness, pain, if the area does not improve, or she develops any fevers or chills she should contact us.  Patient expressed understanding.  Will place order to physical therapy for the patient given her limited range of motion.  Patient to follow back up in 2 weeks.  I instructed patient to call back in the meantime if she has any questions or concerns.  Dr. Ladona Ridgel also had the opportunity to examine the patient and discussed the plan  with her today.

## 2022-10-13 ENCOUNTER — Other Ambulatory Visit: Payer: Self-pay

## 2022-10-13 ENCOUNTER — Encounter: Payer: Self-pay | Admitting: Physical Therapy

## 2022-10-13 ENCOUNTER — Ambulatory Visit: Payer: Managed Care, Other (non HMO) | Attending: Student | Admitting: Physical Therapy

## 2022-10-13 DIAGNOSIS — Z9011 Acquired absence of right breast and nipple: Secondary | ICD-10-CM | POA: Diagnosis not present

## 2022-10-13 DIAGNOSIS — C50911 Malignant neoplasm of unspecified site of right female breast: Secondary | ICD-10-CM | POA: Insufficient documentation

## 2022-10-13 DIAGNOSIS — Z170 Estrogen receptor positive status [ER+]: Secondary | ICD-10-CM | POA: Insufficient documentation

## 2022-10-13 DIAGNOSIS — Z9886 Personal history of breast implant removal: Secondary | ICD-10-CM | POA: Insufficient documentation

## 2022-10-13 DIAGNOSIS — C773 Secondary and unspecified malignant neoplasm of axilla and upper limb lymph nodes: Secondary | ICD-10-CM | POA: Diagnosis not present

## 2022-10-13 DIAGNOSIS — M25511 Pain in right shoulder: Secondary | ICD-10-CM | POA: Diagnosis not present

## 2022-10-13 DIAGNOSIS — M25611 Stiffness of right shoulder, not elsewhere classified: Secondary | ICD-10-CM | POA: Diagnosis not present

## 2022-10-13 DIAGNOSIS — Z171 Estrogen receptor negative status [ER-]: Secondary | ICD-10-CM

## 2022-10-13 DIAGNOSIS — R293 Abnormal posture: Secondary | ICD-10-CM | POA: Insufficient documentation

## 2022-10-13 DIAGNOSIS — Z483 Aftercare following surgery for neoplasm: Secondary | ICD-10-CM | POA: Insufficient documentation

## 2022-10-13 DIAGNOSIS — Z923 Personal history of irradiation: Secondary | ICD-10-CM | POA: Diagnosis not present

## 2022-10-13 NOTE — Therapy (Signed)
OUTPATIENT PHYSICAL THERAPY  UPPER EXTREMITY ONCOLOGY EVALUATION  Patient Name: Robin Farley MRN: [id number] DOB:1966/02/10, 57 y.o., female Today's Date: 10/13/2022  END OF SESSION:  PT End of Session - 10/13/22 1138     Visit Number 1    Number of Visits 9    Date for PT Re-Evaluation 11/10/22    PT Start Time 1106    PT Stop Time 1138    PT Time Calculation (min) 32 min    Activity Tolerance Patient tolerated treatment well    Behavior During Therapy [facility] for tasks assessed/performed             Past Medical History:  Diagnosis Date   Abdominal pain, generalized 12/27/2008   Acute sinusitis, unspecified 02/26/2007   ADD 03/13/2010   ALLERGIC RHINITIS 02/23/2007   ANEMIA 02/05/2007   Anxiety 11/17/2011   ATTENTION DEFICIT DISORDER, HX OF 02/05/2007   Breast cancer (HCC)    Deviated nasal septum 02/05/2007   Family history of breast cancer 11/20/2020   Family history of melanoma 11/20/2020   FATIGUE 02/23/2007   HYPERLIPIDEMIA 02/23/2007   Personal history of malignant melanoma 11/20/2020   Port-A-Cath in place 12/03/2020   SWELLING MASS OR LUMP IN HEAD AND NECK 07/27/2008   UTI 02/23/2007   Past Surgical History:  Procedure Laterality Date   BREAST IMPLANT REMOVAL Right 07/27/2022   Procedure: Removal of right breast implant, washout, drain placemen;  Surgeon: Santiago Glad, MD;  Location: [facility];  Service: Plastics;  Laterality: Right;   BREAST RECONSTRUCTION WITH PLACEMENT OF TISSUE EXPANDER AND FLEX HD (ACELLULAR HYDRATED DERMIS) Right 05/08/2021   Procedure: BREAST RECONSTRUCTION WITH PLACEMENT OF TISSUE EXPANDER AND FLEX HD (ACELLULAR HYDRATED DERMIS);  Surgeon: Allena Napoleon, MD;  Location: [facility];  Service: Plastics;  Laterality: Right;   BROW LIFT     CESAREAN SECTION     COSMETIC SURGERY  1995   Nasal   INCISION AND DRAINAGE OF WOUND Right 09/22/2022   Procedure: right breast washout, debridement, drain placement;  Surgeon:  Santiago Glad, MD;  Location: [facility];  Service: Plastics;  Laterality: Right;   MASTECTOMY W/ SENTINEL NODE BIOPSY Right 05/08/2021   Procedure: RIGHT MASTECTOMY WITH SENTINEL LYMPH NODE BIOPSY;  Surgeon: Manus Rudd, MD;  Location: [facility];  Service: General;  Laterality: Right;   MELANOMA EXCISION WITH SENTINEL LYMPH NODE BIOPSY Left 10/24/2012   Procedure: MELANOMA wide EXCISION left lateral thigh WITH SENTINEL LYMPH NODE BIOPSY left groin;  Surgeon: Mariella Saa, MD;  Location: [facility];  Service: General;  Laterality: Left;   NASAL SEPTUM SURGERY     PORTACATH PLACEMENT Right 12/02/2020   Procedure: INSERTION PORT-A-CATH;  Surgeon: Manus Rudd, MD;  Location: [facility];  Service: General;  Laterality: Right;   RADIOACTIVE SEED GUIDED AXILLARY SENTINEL LYMPH NODE Right 05/08/2021   Procedure: RADIOACTIVE SEED GUIDED AXILLARY SENTINEL LYMPH NODE DISSECTION;  Surgeon: Manus Rudd, MD;  Location: [facility];  Service: General;  Laterality: Right;   REMOVAL OF TISSUE EXPANDER AND PLACEMENT OF IMPLANT Right 05/05/2022   Procedure: REMOVAL OF TISSUE EXPANDER AND PLACEMENT OF IMPLANT;  Surgeon: Santiago Glad, MD;  Location: [facility];  Service: Plastics;  Laterality: Right;   Patient Active Problem List   Diagnosis Date Noted   Elevated LFTs 12/10/2020   Genetic testing 12/06/2020   Family history of breast cancer 11/20/2020   Personal history of  malignant melanoma 11/20/2020   Family history of melanoma 11/20/2020   Malignant neoplasm of upper-outer quadrant of right breast in female, estrogen receptor negative (HCC) 11/12/2020   Vitamin D deficiency 06/02/2019   Hyperglycemia 06/02/2019   Blood pressure elevated without history of HTN 06/02/2019   Headache 12/16/2018   Urinary incontinence, urge 12/14/2017   Calcific bursitis of shoulder 07/10/2015   Nausea alone  04/27/2013   Melanoma of thigh (HCC) 10/06/2012   Anxiety 11/17/2011   Rosacea 07/08/2011   Right shoulder pain 07/08/2011   Knee effusion, left 08/18/2010   Encounter for well adult exam with abnormal findings 08/18/2010   Attention deficit disorder 03/13/2010   GERD 12/27/2008   Hyperlipidemia 02/23/2007   DEPRESSION 02/23/2007   ALLERGIC RHINITIS 02/23/2007   FATIGUE 02/23/2007   ANEMIA 02/05/2007   Deviated nasal septum 02/05/2007    PCP: Oliver Barre, MD  REFERRING PROVIDER: Laurena Spies, PA-C  REFERRING DIAG: [id number] (ICD-10-CM) - History of breast reconstruction  THERAPY DIAG:  Stiffness of right shoulder, not elsewhere classified  Acute pain of right shoulder  Aftercare following surgery for neoplasm  Abnormal posture  Malignant neoplasm of upper-outer quadrant of right breast in female, estrogen receptor negative (HCC)  ONSET DATE: 09/22/22  Rationale for Evaluation and Treatment: Rehabilitation  SUBJECTIVE:                                                                                                                                                                                           SUBJECTIVE STATEMENT: I got an infection in the implant and I had to have surgery to have it taken out. They just took the drain out yesterday. It hurts at end range when moving the arm.   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  She had neoadjuvant chemo, and had  a right mastectomy with immediate expander on 05/08/2021. Completed radiation. Had implant placed on 05/05/22.  Had implant removed on 07/27/22 due to infection. On 09/21/12 had another surgery for debridement and drain placement. Drain removed 10/12/22. PAIN:  Are you having pain? Yes NPRS scale: 1.5/10 Pain location: R chest and shoulder Pain orientation: Right  PAIN TYPE: throbbing in chest, but sharp pinching in  shoulder Pain description: intermittent  Aggravating factors: moving makes shoulder worse, turning and lying on R side hurts chest Relieving factors: not moving  PRECAUTIONS: Other: R UE lymphedema risk  WEIGHT BEARING RESTRICTIONS: No  FALLS:  Has patient fallen in last 6 months? No  LIVING ENVIRONMENT: Lives with: lives with their spouse Lives  in: House/apartment Stairs: Yes; Internal: 15 steps; on left going up Has following equipment at home:  none  OCCUPATION: Agricultural consultant for [employer], full time  LEISURE: Did exercise prior to surgery but has not been able to since  HAND DOMINANCE: right   PRIOR LEVEL OF FUNCTION: Independent  PATIENT GOALS: to have ROM back, decrease tightness   OBJECTIVE:  COGNITION: Overall cognitive status: Within functional limits for tasks assessed   PALPATION: Increased tightness scar tissue at lateral incision extending to axilla  OBSERVATIONS / OTHER ASSESSMENTS: healing mastectomy scar with glue still intact, bandage over drain site   POSTURE: forward head, rounded shoulders  UPPER EXTREMITY AROM/PROM:  A/PROM RIGHT   eval   Shoulder extension 75  Shoulder flexion 152  Shoulder abduction 122 with pain  Shoulder internal rotation 62 with pain  Shoulder external rotation 84    (Blank rows = not tested)  A/PROM LEFT   eval  Shoulder extension 75  Shoulder flexion 167  Shoulder abduction 180  Shoulder internal rotation 60  Shoulder external rotation 83    (Blank rows = not tested)  UPPER EXTREMITY STRENGTH:   LYMPHEDEMA ASSESSMENTS:   SURGERY TYPE/DATE: right mastectomy with immediate expander on 05/08/2021. Had implant placed on 05/05/22.  Had implant removed on 07/27/22 due to infection. On 09/21/12 had another surgery for debridement and drain placement.   NUMBER OF LYMPH NODES REMOVED: 2/4  CHEMOTHERAPY: neoadjuvant completed  RADIATION:completed  HORMONE TREATMENT: none  INFECTIONS:  infection after implant placement with removal on 07/27/22 and subsequent surgery on 09/22/22 for debridement    QUICK DASH SURVEY:   Neldon Mc - 10/13/22 0001     Open a tight or new jar Moderate difficulty    Do heavy household chores (wash walls, wash floors) Moderate difficulty    Carry a shopping bag or briefcase Moderate difficulty    Wash your back Severe difficulty    Use a knife to cut food No difficulty    Recreational activities in which you take some force or impact through your arm, shoulder, or hand (golf, hammering, tennis) Severe difficulty    During the past week, to what extent has your arm, shoulder or hand problem interfered with your normal social activities with family, friends, neighbors, or groups? Quite a bit    During the past week, to what extent has your arm, shoulder or hand problem limited your work or other regular daily activities Quite a bit    Arm, shoulder, or hand pain. Moderate    Tingling (pins and needles) in your arm, shoulder, or hand Mild    Difficulty Sleeping Mild difficulty    DASH Score 50 %               TODAY'S TREATMENT:  DATE:  10/13/22- created chip pack for pt to wear in her compression wrap for additional compression   PATIENT EDUCATION:  Education details: be careful with arm - avoid lifting or repetitive motion over the next several days to decrease risk of seroma forming Person educated: Patient Education method: Explanation Education comprehension: verbalized understanding  HOME EXERCISE PROGRAM: Wear chip pack in compression wrap  ASSESSMENT:  CLINICAL IMPRESSION: Patient is a 57 y.o. female who was seen today for physical therapy evaluation and treatment for decreased R shoulder ROM and pain following multiple surgeries to remove her implant and for debridement with the last surgery  on 09/22/22. She had her last drain removed yesterday. She demonstrates decrease R shoulder ROM and pain in R shoulder with abduction and flexion. Pt would benefit from skilled PT services to improve R shoulder ROM and decrease pain to allow pt to return to prior level of function. .    OBJECTIVE IMPAIRMENTS: decreased ROM, decreased strength, increased fascial restrictions, impaired UE functional use, postural dysfunction, and pain.   ACTIVITY LIMITATIONS: carrying, lifting, sleeping, and reach over head  PARTICIPATION LIMITATIONS: cleaning, community activity, and occupation  PERSONAL FACTORS: Past/current experiences are also affecting patient's functional outcome.   REHAB POTENTIAL: Good  CLINICAL DECISION MAKING: Stable/uncomplicated  EVALUATION COMPLEXITY: Low  GOALS: Goals reviewed with patient? Yes  SHORT TERM GOALS=LONG TERM GOALS Target date: 11/10/22  Pt will demonstrate 165 degrees of R shoulder flexion to allow her to reach overhead. Baseline: 152 Goal status: INITIAL  2.  Pt will demonstrate 165 degrees of R shoulder abduction to allow her to reach out to the side.  Baseline: 122 Goal status: INITIAL  3.  Pt will report a 75% improvement in tightness across R chest to allow improved comfort.  Baseline:  Goal status: INITIAL  4.  Pt will be independent in a home exercise program for continued stretching and strengthening.  Baseline:  Goal status: INITIAL   PLAN:  PT FREQUENCY: 2x/week  PT DURATION: 4 weeks  PLANNED INTERVENTIONS: Therapeutic exercises, Therapeutic activity, Patient/Family education, Self Care, Joint mobilization, Manual lymph drainage, Compression bandaging, scar mobilization, Taping, Vasopneumatic device, and Manual therapy  PLAN FOR NEXT SESSION: begin gentle PROM to R shoulder, try pulleys and ball, STM to R pec if tolerable, pec stretching, MFR to axilla  [employer], PT 10/13/2022, 11:38 AM

## 2022-10-16 ENCOUNTER — Ambulatory Visit: Payer: Managed Care, Other (non HMO)

## 2022-10-20 ENCOUNTER — Encounter: Payer: Self-pay | Admitting: Rehabilitation

## 2022-10-20 ENCOUNTER — Ambulatory Visit: Payer: Managed Care, Other (non HMO) | Admitting: Hematology and Oncology

## 2022-10-20 ENCOUNTER — Ambulatory Visit: Payer: Managed Care, Other (non HMO) | Admitting: Rehabilitation

## 2022-10-20 DIAGNOSIS — R293 Abnormal posture: Secondary | ICD-10-CM

## 2022-10-20 DIAGNOSIS — Z483 Aftercare following surgery for neoplasm: Secondary | ICD-10-CM | POA: Diagnosis not present

## 2022-10-20 DIAGNOSIS — M25511 Pain in right shoulder: Secondary | ICD-10-CM

## 2022-10-20 DIAGNOSIS — M25611 Stiffness of right shoulder, not elsewhere classified: Secondary | ICD-10-CM

## 2022-10-20 DIAGNOSIS — Z171 Estrogen receptor negative status [ER-]: Secondary | ICD-10-CM

## 2022-10-20 NOTE — Therapy (Signed)
OUTPATIENT PHYSICAL THERAPY  UPPER EXTREMITY ONCOLOGY TREATMENT  Patient Name: Robin Farley MRN: [id number] DOB:[birth year], 57 y.o., female Today's Date: 10/20/2022  END OF SESSION:  PT End of Session - 10/20/22 0857     Visit Number 2    Number of Visits 9    PT Start Time 0900    PT Stop Time 0945    PT Time Calculation (min) 45 min    Activity Tolerance Patient tolerated treatment well    Behavior During Therapy [facility] for tasks assessed/performed             Past Medical History:  Diagnosis Date   Abdominal pain, generalized 12/27/2008   Acute sinusitis, unspecified 02/26/2007   ADD 03/13/2010   ALLERGIC RHINITIS 02/23/2007   ANEMIA 02/05/2007   Anxiety 11/17/2011   ATTENTION DEFICIT DISORDER, HX OF 02/05/2007   Breast cancer (HCC)    Deviated nasal septum 02/05/2007   Family history of breast cancer 11/20/2020   Family history of melanoma 11/20/2020   FATIGUE 02/23/2007   HYPERLIPIDEMIA 02/23/2007   Personal history of malignant melanoma 11/20/2020   Port-A-Cath in place 12/03/2020   SWELLING MASS OR LUMP IN HEAD AND NECK 07/27/2008   UTI 02/23/2007   Past Surgical History:  Procedure Laterality Date   BREAST IMPLANT REMOVAL Right 07/27/2022   Procedure: Removal of right breast implant, washout, drain placemen;  Surgeon: Santiago Glad, MD;  Location: [facility];  Service: Plastics;  Laterality: Right;   BREAST RECONSTRUCTION WITH PLACEMENT OF TISSUE EXPANDER AND FLEX HD (ACELLULAR HYDRATED DERMIS) Right 05/08/2021   Procedure: BREAST RECONSTRUCTION WITH PLACEMENT OF TISSUE EXPANDER AND FLEX HD (ACELLULAR HYDRATED DERMIS);  Surgeon: Allena Napoleon, MD;  Location: [facility];  Service: Plastics;  Laterality: Right;   BROW LIFT     CESAREAN SECTION     COSMETIC SURGERY  1995   Nasal   INCISION AND DRAINAGE OF WOUND Right 09/22/2022   Procedure: right breast washout, debridement, drain placement;  Surgeon: Santiago Glad, MD;  Location: [facility];  Service: Plastics;  Laterality: Right;   MASTECTOMY W/ SENTINEL NODE BIOPSY Right 05/08/2021   Procedure: RIGHT MASTECTOMY WITH SENTINEL LYMPH NODE BIOPSY;  Surgeon: Manus Rudd, MD;  Location: [facility];  Service: General;  Laterality: Right;   MELANOMA EXCISION WITH SENTINEL LYMPH NODE BIOPSY Left 10/24/2012   Procedure: MELANOMA wide EXCISION left lateral thigh WITH SENTINEL LYMPH NODE BIOPSY left groin;  Surgeon: Mariella Saa, MD;  Location: [facility];  Service: General;  Laterality: Left;   NASAL SEPTUM SURGERY     PORTACATH PLACEMENT Right 12/02/2020   Procedure: INSERTION PORT-A-CATH;  Surgeon: Manus Rudd, MD;  Location: [facility];  Service: General;  Laterality: Right;   RADIOACTIVE SEED GUIDED AXILLARY SENTINEL LYMPH NODE Right 05/08/2021   Procedure: RADIOACTIVE SEED GUIDED AXILLARY SENTINEL LYMPH NODE DISSECTION;  Surgeon: Manus Rudd, MD;  Location: [facility];  Service: General;  Laterality: Right;   REMOVAL OF TISSUE EXPANDER AND PLACEMENT OF IMPLANT Right 05/05/2022   Procedure: REMOVAL OF TISSUE EXPANDER AND PLACEMENT OF IMPLANT;  Surgeon: Santiago Glad, MD;  Location: [facility];  Service: Plastics;  Laterality: Right;   Patient Active Problem List   Diagnosis Date Noted   Elevated LFTs 12/10/2020   Genetic testing 12/06/2020   Family history of breast cancer 11/20/2020   Personal history of malignant melanoma 11/20/2020   Family history of  melanoma 11/20/2020   Malignant neoplasm of upper-outer quadrant of right breast in female, estrogen receptor negative (HCC) 11/12/2020   Vitamin D deficiency 06/02/2019   Hyperglycemia 06/02/2019   Blood pressure elevated without history of HTN 06/02/2019   Headache 12/16/2018   Urinary incontinence, urge 12/14/2017   Calcific bursitis of shoulder 07/10/2015   Nausea alone 04/27/2013   Melanoma of thigh (HCC) 10/06/2012    Anxiety 11/17/2011   Rosacea 07/08/2011   Right shoulder pain 07/08/2011   Knee effusion, left 08/18/2010   Encounter for well adult exam with abnormal findings 08/18/2010   Attention deficit disorder 03/13/2010   GERD 12/27/2008   Hyperlipidemia 02/23/2007   DEPRESSION 02/23/2007   ALLERGIC RHINITIS 02/23/2007   FATIGUE 02/23/2007   ANEMIA 02/05/2007   Deviated nasal septum 02/05/2007    PCP: Oliver Barre, MD  REFERRING PROVIDER: Laurena Spies, PA-C  REFERRING DIAG: [phone] (ICD-10-CM) - History of breast reconstruction  THERAPY DIAG:  Stiffness of right shoulder, not elsewhere classified  Acute pain of right shoulder  Aftercare following surgery for neoplasm  Abnormal posture  Malignant neoplasm of upper-outer quadrant of right breast in female, estrogen receptor negative (HCC)  ONSET DATE: 09/22/22  Rationale for Evaluation and Treatment: Rehabilitation  SUBJECTIVE:                                                                                                                                                                                           SUBJECTIVE STATEMENT: I am feeling less swollen than last time and better.   EVAL: I got an infection in the implant and I had to have surgery to have it taken out. They just took the drain out yesterday. It hurts at end range when moving the arm.   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  She had neoadjuvant chemo, and had  a right mastectomy with immediate expander on 05/08/2021. Completed radiation. Had implant placed on 05/05/22.  Had implant removed on 07/27/22 due to infection. On 09/21/12 had another surgery for debridement and drain placement. Drain removed 10/12/22.  PAIN:  Are you having pain? Yes NPRS scale: 1.5/10 Pain location: R chest and shoulder Pain orientation: Right  PAIN TYPE: throbbing in chest, but  sharp pinching in shoulder Pain description: intermittent  Aggravating factors: moving makes shoulder worse, turning and lying on R side hurts chest Relieving factors: not moving  PRECAUTIONS: Other: R UE lymphedema risk  WEIGHT BEARING RESTRICTIONS: No  FALLS:  Has patient fallen in last 6 months? No  LIVING ENVIRONMENT: Lives  with: lives with their spouse Lives in: House/apartment Stairs: Yes; Internal: 15 steps; on left going up Has following equipment at home:  none  OCCUPATION: Agricultural consultant for [employer], full time  LEISURE: Did exercise prior to surgery but has not been able to since  HAND DOMINANCE: right   PRIOR LEVEL OF FUNCTION: Independent  PATIENT GOALS: to have ROM back, decrease tightness   OBJECTIVE:  COGNITION: Overall cognitive status: Within functional limits for tasks assessed   PALPATION: Increased tightness scar tissue at lateral incision extending to axilla  OBSERVATIONS / OTHER ASSESSMENTS: healing mastectomy scar with glue still intact, bandage over drain site  POSTURE: forward head, rounded shoulders  UPPER EXTREMITY AROM/PROM:  A/PROM RIGHT   eval   Shoulder extension 75  Shoulder flexion 152  Shoulder abduction 122 with pain  Shoulder internal rotation 62 with pain  Shoulder external rotation 84    (Blank rows = not tested)  A/PROM LEFT   eval  Shoulder extension 75  Shoulder flexion 167  Shoulder abduction 180  Shoulder internal rotation 60  Shoulder external rotation 83    (Blank rows = not tested)  UPPER EXTREMITY STRENGTH:   LYMPHEDEMA ASSESSMENTS:  SURGERY TYPE/DATE: right mastectomy with immediate expander on 05/08/2021. Had implant placed on 05/05/22.  Had implant removed on 07/27/22 due to infection. On 09/21/12 had another surgery for debridement and drain placement.  NUMBER OF LYMPH NODES REMOVED: 2/4 CHEMOTHERAPY: neoadjuvant completed RADIATION:completed HORMONE TREATMENT: none INFECTIONS:  infection after implant placement with removal on 07/27/22 and subsequent surgery on 09/22/22 for debridement  TODAY'S TREATMENT:                                                                                                                                          DATE:  10/20/22 PROM Rt shoulder into flexion, abduction, D2, and ER with STM to the pectoralis throughout and in neutral with cocoa butter. Gentle skin movement around incision with minimal skin mobility and slide over radiated skin.  Pulleys into flexion and abduction/scaption x each  10/13/22- created chip pack for pt to wear in her compression wrap for additional compression   PATIENT EDUCATION:  Education details: be careful with arm - avoid lifting or repetitive motion over the next several days to decrease risk of seroma forming Person educated: Patient Education method: Explanation Education comprehension: verbalized understanding  HOME EXERCISE PROGRAM: Wear chip pack in compression wrap  ASSESSMENT:  CLINICAL IMPRESSION: Pt reports she is doing better already.  Not feeling swollen and feeling like her ROM is getting better and better.    OBJECTIVE IMPAIRMENTS: decreased ROM, decreased strength, increased fascial restrictions, impaired UE functional use, postural dysfunction, and pain.   ACTIVITY LIMITATIONS: carrying, lifting, sleeping, and reach over head  PARTICIPATION LIMITATIONS: cleaning, community activity, and occupation  PERSONAL FACTORS: Past/current experiences are also affecting patient's functional outcome.  REHAB POTENTIAL: Good  CLINICAL DECISION MAKING: Stable/uncomplicated  EVALUATION COMPLEXITY: Low  GOALS: Goals reviewed with patient? Yes  SHORT TERM GOALS=LONG TERM GOALS Target date: 11/10/22  Pt will demonstrate 165 degrees of R shoulder flexion to allow her to reach overhead. Baseline: 152 Goal status: INITIAL  2.  Pt will demonstrate 165 degrees of R shoulder abduction to  allow her to reach out to the side.  Baseline: 122 Goal status: INITIAL  3.  Pt will report a 75% improvement in tightness across R chest to allow improved comfort.  Baseline:  Goal status: INITIAL  4.  Pt will be independent in a home exercise program for continued stretching and strengthening.  Baseline:  Goal status: INITIAL   PLAN:  PT FREQUENCY: 2x/week  PT DURATION: 4 weeks  PLANNED INTERVENTIONS: Therapeutic exercises, Therapeutic activity, Patient/Family education, Self Care, Joint mobilization, Manual lymph drainage, Compression bandaging, scar mobilization, Taping, Vasopneumatic device, and Manual therapy  PLAN FOR NEXT SESSION: begin gentle PROM to R shoulder, try pulleys and ball, STM to R pec if tolerable, pec stretching, MFR to axilla  Xavius Spadafore, Julieanne Manson, PT 10/20/2022, 9:50 AM

## 2022-10-22 ENCOUNTER — Ambulatory Visit: Payer: Managed Care, Other (non HMO)

## 2022-10-22 DIAGNOSIS — M25611 Stiffness of right shoulder, not elsewhere classified: Secondary | ICD-10-CM

## 2022-10-22 DIAGNOSIS — Z483 Aftercare following surgery for neoplasm: Secondary | ICD-10-CM | POA: Diagnosis not present

## 2022-10-22 DIAGNOSIS — R293 Abnormal posture: Secondary | ICD-10-CM

## 2022-10-22 DIAGNOSIS — M25511 Pain in right shoulder: Secondary | ICD-10-CM

## 2022-10-22 DIAGNOSIS — Z171 Estrogen receptor negative status [ER-]: Secondary | ICD-10-CM

## 2022-10-22 NOTE — Therapy (Signed)
OUTPATIENT PHYSICAL THERAPY  UPPER EXTREMITY ONCOLOGY TREATMENT  Patient Name: Robin Farley MRN: [id number] DOB:[birth year], 57 y.o., female Today's Date: 10/22/2022  END OF SESSION:  PT End of Session - 10/22/22 0909     Visit Number 3    Number of Visits 9    Date for PT Re-Evaluation 11/10/22    PT Start Time 0907    PT Stop Time 1002    PT Time Calculation (min) 55 min    Activity Tolerance Patient tolerated treatment well    Behavior During Therapy [facility] for tasks assessed/performed             Past Medical History:  Diagnosis Date   Abdominal pain, generalized 12/27/2008   Acute sinusitis, unspecified 02/26/2007   ADD 03/13/2010   ALLERGIC RHINITIS 02/23/2007   ANEMIA 02/05/2007   Anxiety 11/17/2011   ATTENTION DEFICIT DISORDER, HX OF 02/05/2007   Breast cancer (HCC)    Deviated nasal septum 02/05/2007   Family history of breast cancer 11/20/2020   Family history of melanoma 11/20/2020   FATIGUE 02/23/2007   HYPERLIPIDEMIA 02/23/2007   Personal history of malignant melanoma 11/20/2020   Port-A-Cath in place 12/03/2020   SWELLING MASS OR LUMP IN HEAD AND NECK 07/27/2008   UTI 02/23/2007   Past Surgical History:  Procedure Laterality Date   BREAST IMPLANT REMOVAL Right 07/27/2022   Procedure: Removal of right breast implant, washout, drain placemen;  Surgeon: Santiago Glad, MD;  Location: [facility];  Service: Plastics;  Laterality: Right;   BREAST RECONSTRUCTION WITH PLACEMENT OF TISSUE EXPANDER AND FLEX HD (ACELLULAR HYDRATED DERMIS) Right 05/08/2021   Procedure: BREAST RECONSTRUCTION WITH PLACEMENT OF TISSUE EXPANDER AND FLEX HD (ACELLULAR HYDRATED DERMIS);  Surgeon: Allena Napoleon, MD;  Location: [facility];  Service: Plastics;  Laterality: Right;   BROW LIFT     CESAREAN SECTION     COSMETIC SURGERY  1995   Nasal   INCISION AND DRAINAGE OF WOUND Right 09/22/2022   Procedure: right breast washout, debridement, drain placement;  Surgeon:  Santiago Glad, MD;  Location: [facility];  Service: Plastics;  Laterality: Right;   MASTECTOMY W/ SENTINEL NODE BIOPSY Right 05/08/2021   Procedure: RIGHT MASTECTOMY WITH SENTINEL LYMPH NODE BIOPSY;  Surgeon: Manus Rudd, MD;  Location: [facility];  Service: General;  Laterality: Right;   MELANOMA EXCISION WITH SENTINEL LYMPH NODE BIOPSY Left 10/24/2012   Procedure: MELANOMA wide EXCISION left lateral thigh WITH SENTINEL LYMPH NODE BIOPSY left groin;  Surgeon: Mariella Saa, MD;  Location: [facility];  Service: General;  Laterality: Left;   NASAL SEPTUM SURGERY     PORTACATH PLACEMENT Right 12/02/2020   Procedure: INSERTION PORT-A-CATH;  Surgeon: Manus Rudd, MD;  Location: [facility];  Service: General;  Laterality: Right;   RADIOACTIVE SEED GUIDED AXILLARY SENTINEL LYMPH NODE Right 05/08/2021   Procedure: RADIOACTIVE SEED GUIDED AXILLARY SENTINEL LYMPH NODE DISSECTION;  Surgeon: Manus Rudd, MD;  Location: [facility];  Service: General;  Laterality: Right;   REMOVAL OF TISSUE EXPANDER AND PLACEMENT OF IMPLANT Right 05/05/2022   Procedure: REMOVAL OF TISSUE EXPANDER AND PLACEMENT OF IMPLANT;  Surgeon: Santiago Glad, MD;  Location: [facility];  Service: Plastics;  Laterality: Right;   Patient Active Problem List   Diagnosis Date Noted   Elevated LFTs 12/10/2020   Genetic testing 12/06/2020   Family history of breast cancer 11/20/2020   Personal history of  malignant melanoma 11/20/2020   Family history of melanoma 11/20/2020   Malignant neoplasm of upper-outer quadrant of right breast in female, estrogen receptor negative (HCC) 11/12/2020   Vitamin D deficiency 06/02/2019   Hyperglycemia 06/02/2019   Blood pressure elevated without history of HTN 06/02/2019   Headache 12/16/2018   Urinary incontinence, urge 12/14/2017   Calcific bursitis of shoulder 07/10/2015   Nausea alone  04/27/2013   Melanoma of thigh (HCC) 10/06/2012   Anxiety 11/17/2011   Rosacea 07/08/2011   Right shoulder pain 07/08/2011   Knee effusion, left 08/18/2010   Encounter for well adult exam with abnormal findings 08/18/2010   Attention deficit disorder 03/13/2010   GERD 12/27/2008   Hyperlipidemia 02/23/2007   DEPRESSION 02/23/2007   ALLERGIC RHINITIS 02/23/2007   FATIGUE 02/23/2007   ANEMIA 02/05/2007   Deviated nasal septum 02/05/2007    PCP: Oliver Barre, MD  REFERRING PROVIDER: Laurena Spies, PA-C  REFERRING DIAG: [phone] (ICD-10-CM) - History of breast reconstruction  THERAPY DIAG:  Stiffness of right shoulder, not elsewhere classified  Acute pain of right shoulder  Aftercare following surgery for neoplasm  Abnormal posture  Malignant neoplasm of upper-outer quadrant of right breast in female, estrogen receptor negative (HCC)  ONSET DATE: 09/22/22  Rationale for Evaluation and Treatment: Rehabilitation  SUBJECTIVE:                                                                                                                                                                                           SUBJECTIVE STATEMENT: I felt a lot better after last session. The HEP stretches are going well.   EVAL: I got an infection in the implant and I had to have surgery to have it taken out. They just took the drain out yesterday. It hurts at end range when moving the arm.   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  She had neoadjuvant chemo, and had  a right mastectomy with immediate expander on 05/08/2021. Completed radiation. Had implant placed on 05/05/22.  Had implant removed on 07/27/22 due to infection. On 09/21/12 had another surgery for debridement and drain placement. Drain removed 10/12/22.  PAIN:  Are you having pain? No, just an occasional pinch in my Rt scapula with a  particular movement  PRECAUTIONS: Other: R UE lymphedema risk  WEIGHT BEARING RESTRICTIONS: No  FALLS:  Has patient fallen in last 6 months? No  LIVING ENVIRONMENT: Lives with: lives with their spouse Lives in: House/apartment Stairs: Yes; Internal: 15 steps; on left going up Has following equipment at  home:  none  OCCUPATION: Agricultural consultant for [employer], full time  LEISURE: Did exercise prior to surgery but has not been able to since  HAND DOMINANCE: right   PRIOR LEVEL OF FUNCTION: Independent  PATIENT GOALS: to have ROM back, decrease tightness   OBJECTIVE:  COGNITION: Overall cognitive status: Within functional limits for tasks assessed   PALPATION: Increased tightness scar tissue at lateral incision extending to axilla  OBSERVATIONS / OTHER ASSESSMENTS: healing mastectomy scar with glue still intact, bandage over drain site  POSTURE: forward head, rounded shoulders  UPPER EXTREMITY AROM/PROM:  A/PROM RIGHT   eval   Shoulder extension 75  Shoulder flexion 152  Shoulder abduction 122 with pain  Shoulder internal rotation 62 with pain  Shoulder external rotation 84    (Blank rows = not tested)  A/PROM LEFT   eval  Shoulder extension 75  Shoulder flexion 167  Shoulder abduction 180  Shoulder internal rotation 60  Shoulder external rotation 83    (Blank rows = not tested)  UPPER EXTREMITY STRENGTH:   LYMPHEDEMA ASSESSMENTS:  SURGERY TYPE/DATE: right mastectomy with immediate expander on 05/08/2021. Had implant placed on 05/05/22.  Had implant removed on 07/27/22 due to infection. On 09/21/12 had another surgery for debridement and drain placement.  NUMBER OF LYMPH NODES REMOVED: 2/4 CHEMOTHERAPY: neoadjuvant completed RADIATION:completed HORMONE TREATMENT: none INFECTIONS: infection after implant placement with removal on 07/27/22 and subsequent surgery on 09/22/22 for debridement  TODAY'S TREATMENT:                                                                                                                                           DATE:  10/22/22 Therapeutic Exercises Pulleys into flexion and abd x 2 mins each returning therapist demo Roll yellow ball up wall into flexion and Rt UE abd x 10 each  Supine over half foam roll for bil UE horz abd x 10 Manual Therapy P/ROM in supine to Rt shoulder into flexion, abd and D2 with scapular depression throughout MFR to Rt chest wall superior to incision and at lateral trunk  10/20/22 PROM Rt shoulder into flexion, abduction, D2, and ER with STM to the pectoralis throughout and in neutral with cocoa butter. Gentle skin movement around incision with minimal skin mobility and slide over radiated skin.  Pulleys into flexion and abduction/scaption x each  10/13/22- created chip pack for pt to wear in her compression wrap for additional compression   PATIENT EDUCATION:  Education details: be careful with arm - avoid lifting or repetitive motion over the next several days to decrease risk of seroma forming Person educated: Patient Education method: Explanation Education comprehension: verbalized understanding  HOME EXERCISE PROGRAM: Wear chip pack in compression wrap  ASSESSMENT:  CLINICAL IMPRESSION: Pt reports continuing to feel improvement after each session. Progressed stretches during session today and continued with manual therapy working to decrease  the Rt upper quadrant tightness.    OBJECTIVE IMPAIRMENTS: decreased ROM, decreased strength, increased fascial restrictions, impaired UE functional use, postural dysfunction, and pain.   ACTIVITY LIMITATIONS: carrying, lifting, sleeping, and reach over head  PARTICIPATION LIMITATIONS: cleaning, community activity, and occupation  PERSONAL FACTORS: Past/current experiences are also affecting patient's functional outcome.   REHAB POTENTIAL: Good  CLINICAL DECISION MAKING: Stable/uncomplicated  EVALUATION COMPLEXITY:  Low  GOALS: Goals reviewed with patient? Yes  SHORT TERM GOALS=LONG TERM GOALS Target date: 11/10/22  Pt will demonstrate 165 degrees of R shoulder flexion to allow her to reach overhead. Baseline: 152 Goal status: INITIAL  2.  Pt will demonstrate 165 degrees of R shoulder abduction to allow her to reach out to the side.  Baseline: 122 Goal status: INITIAL  3.  Pt will report a 75% improvement in tightness across R chest to allow improved comfort.  Baseline:  Goal status: INITIAL  4.  Pt will be independent in a home exercise program for continued stretching and strengthening.  Baseline:  Goal status: INITIAL   PLAN:  PT FREQUENCY: 2x/week  PT DURATION: 4 weeks  PLANNED INTERVENTIONS: Therapeutic exercises, Therapeutic activity, Patient/Family education, Self Care, Joint mobilization, Manual lymph drainage, Compression bandaging, scar mobilization, Taping, Vasopneumatic device, and Manual therapy  PLAN FOR NEXT SESSION: cont PROM to R shoulder, pulleys and ball, STM to R pec, Rt pec stretching, MFR to axilla  Hermenia Bers, PTA 10/22/2022, 10:06 AM

## 2022-10-27 ENCOUNTER — Ambulatory Visit: Payer: Managed Care, Other (non HMO)

## 2022-10-27 DIAGNOSIS — M25511 Pain in right shoulder: Secondary | ICD-10-CM

## 2022-10-27 DIAGNOSIS — M25611 Stiffness of right shoulder, not elsewhere classified: Secondary | ICD-10-CM

## 2022-10-27 DIAGNOSIS — R293 Abnormal posture: Secondary | ICD-10-CM

## 2022-10-27 DIAGNOSIS — Z483 Aftercare following surgery for neoplasm: Secondary | ICD-10-CM | POA: Diagnosis not present

## 2022-10-27 DIAGNOSIS — Z171 Estrogen receptor negative status [ER-]: Secondary | ICD-10-CM

## 2022-10-27 NOTE — Therapy (Signed)
OUTPATIENT PHYSICAL THERAPY  UPPER EXTREMITY ONCOLOGY TREATMENT  Patient Name: Robin Farley MRN: [id number] DOB:17-Jun-1965, 57 y.o., female Today's Date: 10/27/2022  END OF SESSION:  PT End of Session - 10/27/22 0958     Visit Number 4    Number of Visits 9    Date for PT Re-Evaluation 11/10/22    PT Start Time 1000    PT Stop Time 1050    PT Time Calculation (min) 50 min    Activity Tolerance Patient tolerated treatment well    Behavior During Therapy [facility] for tasks assessed/performed             Past Medical History:  Diagnosis Date   Abdominal pain, generalized 12/27/2008   Acute sinusitis, unspecified 02/26/2007   ADD 03/13/2010   ALLERGIC RHINITIS 02/23/2007   ANEMIA 02/05/2007   Anxiety 11/17/2011   ATTENTION DEFICIT DISORDER, HX OF 02/05/2007   Breast cancer (HCC)    Deviated nasal septum 02/05/2007   Family history of breast cancer 11/20/2020   Family history of melanoma 11/20/2020   FATIGUE 02/23/2007   HYPERLIPIDEMIA 02/23/2007   Personal history of malignant melanoma 11/20/2020   Port-A-Cath in place 12/03/2020   SWELLING MASS OR LUMP IN HEAD AND NECK 07/27/2008   UTI 02/23/2007   Past Surgical History:  Procedure Laterality Date   BREAST IMPLANT REMOVAL Right 07/27/2022   Procedure: Removal of right breast implant, washout, drain placemen;  Surgeon: Santiago Glad, MD;  Location: [facility];  Service: Plastics;  Laterality: Right;   BREAST RECONSTRUCTION WITH PLACEMENT OF TISSUE EXPANDER AND FLEX HD (ACELLULAR HYDRATED DERMIS) Right 05/08/2021   Procedure: BREAST RECONSTRUCTION WITH PLACEMENT OF TISSUE EXPANDER AND FLEX HD (ACELLULAR HYDRATED DERMIS);  Surgeon: Allena Napoleon, MD;  Location: [facility];  Service: Plastics;  Laterality: Right;   BROW LIFT     CESAREAN SECTION     COSMETIC SURGERY  1995   Nasal   INCISION AND DRAINAGE OF WOUND Right 09/22/2022   Procedure: right breast washout, debridement, drain placement;  Surgeon:  Santiago Glad, MD;  Location: [facility];  Service: Plastics;  Laterality: Right;   MASTECTOMY W/ SENTINEL NODE BIOPSY Right 05/08/2021   Procedure: RIGHT MASTECTOMY WITH SENTINEL LYMPH NODE BIOPSY;  Surgeon: Manus Rudd, MD;  Location: [facility];  Service: General;  Laterality: Right;   MELANOMA EXCISION WITH SENTINEL LYMPH NODE BIOPSY Left 10/24/2012   Procedure: MELANOMA wide EXCISION left lateral thigh WITH SENTINEL LYMPH NODE BIOPSY left groin;  Surgeon: Mariella Saa, MD;  Location: [facility];  Service: General;  Laterality: Left;   NASAL SEPTUM SURGERY     PORTACATH PLACEMENT Right 12/02/2020   Procedure: INSERTION PORT-A-CATH;  Surgeon: Manus Rudd, MD;  Location: [facility];  Service: General;  Laterality: Right;   RADIOACTIVE SEED GUIDED AXILLARY SENTINEL LYMPH NODE Right 05/08/2021   Procedure: RADIOACTIVE SEED GUIDED AXILLARY SENTINEL LYMPH NODE DISSECTION;  Surgeon: Manus Rudd, MD;  Location: [facility];  Service: General;  Laterality: Right;   REMOVAL OF TISSUE EXPANDER AND PLACEMENT OF IMPLANT Right 05/05/2022   Procedure: REMOVAL OF TISSUE EXPANDER AND PLACEMENT OF IMPLANT;  Surgeon: Santiago Glad, MD;  Location: [facility];  Service: Plastics;  Laterality: Right;   Patient Active Problem List   Diagnosis Date Noted   Elevated LFTs 12/10/2020   Genetic testing 12/06/2020   Family history of breast cancer 11/20/2020   Personal history of  malignant melanoma 11/20/2020   Family history of melanoma 11/20/2020   Malignant neoplasm of upper-outer quadrant of right breast in female, estrogen receptor negative (HCC) 11/12/2020   Vitamin D deficiency 06/02/2019   Hyperglycemia 06/02/2019   Blood pressure elevated without history of HTN 06/02/2019   Headache 12/16/2018   Urinary incontinence, urge 12/14/2017   Calcific bursitis of shoulder 07/10/2015   Nausea alone  04/27/2013   Melanoma of thigh (HCC) 10/06/2012   Anxiety 11/17/2011   Rosacea 07/08/2011   Right shoulder pain 07/08/2011   Knee effusion, left 08/18/2010   Encounter for well adult exam with abnormal findings 08/18/2010   Attention deficit disorder 03/13/2010   GERD 12/27/2008   Hyperlipidemia 02/23/2007   DEPRESSION 02/23/2007   ALLERGIC RHINITIS 02/23/2007   FATIGUE 02/23/2007   ANEMIA 02/05/2007   Deviated nasal septum 02/05/2007    PCP: Oliver Barre, MD  REFERRING PROVIDER: Laurena Spies, PA-C  REFERRING DIAG: [phone] (ICD-10-CM) - History of breast reconstruction  THERAPY DIAG:  Stiffness of right shoulder, not elsewhere classified  Acute pain of right shoulder  Aftercare following surgery for neoplasm  Abnormal posture  Malignant neoplasm of upper-outer quadrant of right breast in female, estrogen receptor negative (HCC)  ONSET DATE: 09/22/22  Rationale for Evaluation and Treatment: Rehabilitation  SUBJECTIVE:                                                                                                                                                                                           SUBJECTIVE STATEMENT:  Seem to be healing better. I see the MD on Thursday and hope the stitches will come out then. Pain is better. I still get a pinch in my shoulder with certain activities like abd .   EVAL: I got an infection in the implant and I had to have surgery to have it taken out. They just took the drain out yesterday. It hurts at end range when moving the arm.   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  She had neoadjuvant chemo, and had  a right mastectomy with immediate expander on 05/08/2021. Completed radiation. Had implant placed on 05/05/22.  Had implant removed on 07/27/22 due to infection. On 09/21/12 had another surgery for debridement and drain placement.  Drain removed 10/12/22.  PAIN:  Are you having pain? No, just an occasional pinch in my Rt scapula with a particular movement  PRECAUTIONS: Other: R UE lymphedema risk  WEIGHT BEARING RESTRICTIONS: No  FALLS:  Has patient fallen in last 6 months? No  LIVING  ENVIRONMENT: Lives with: lives with their spouse Lives in: House/apartment Stairs: Yes; Internal: 15 steps; on left going up Has following equipment at home:  none  OCCUPATION: Agricultural consultant for [employer], full time  LEISURE: Did exercise prior to surgery but has not been able to since  HAND DOMINANCE: right   PRIOR LEVEL OF FUNCTION: Independent  PATIENT GOALS: to have ROM back, decrease tightness   OBJECTIVE:  COGNITION: Overall cognitive status: Within functional limits for tasks assessed   PALPATION: Increased tightness scar tissue at lateral incision extending to axilla  OBSERVATIONS / OTHER ASSESSMENTS: healing mastectomy scar with glue still intact, bandage over drain site  POSTURE: forward head, rounded shoulders  UPPER EXTREMITY AROM/PROM:  A/PROM RIGHT   eval   Shoulder extension 75  Shoulder flexion 152  Shoulder abduction 122 with pain  Shoulder internal rotation 62 with pain  Shoulder external rotation 84    (Blank rows = not tested)  A/PROM LEFT   eval  Shoulder extension 75  Shoulder flexion 167  Shoulder abduction 180  Shoulder internal rotation 60  Shoulder external rotation 83    (Blank rows = not tested)  UPPER EXTREMITY STRENGTH:   LYMPHEDEMA ASSESSMENTS:  SURGERY TYPE/DATE: right mastectomy with immediate expander on 05/08/2021. Had implant placed on 05/05/22.  Had implant removed on 07/27/22 due to infection. On 09/21/12 had another surgery for debridement and drain placement.  NUMBER OF LYMPH NODES REMOVED: 2/4 CHEMOTHERAPY: neoadjuvant completed RADIATION:completed HORMONE TREATMENT: none INFECTIONS: infection after implant placement with removal on 07/27/22  and subsequent surgery on 09/22/22 for debridement  TODAY'S TREATMENT:                                                                                                                                          DATE:  10/27/2022 pulleys into flexion and abd x 2 mins each returning therapist demo Roll yellowPw ball up wall into flexion and Rt UE abd x 10 each\ LTR x 5 knees to the left with arms outstretched. Manual Therapy STM to right pecs, UT, lateral trunk with cocoa butter P/ROM in supine to Rt shoulder into flexion, abd and D2 with scapular depression throughout MFR to Rt chest wall superior to incision and at lateral trunk   10/22/22 Therapeutic Exercises pulleys into flexion and abd x 2 mins each returning therapist demo Roll yelloPw ball up wall into flexion and Rt UE abd x 10 each  Supine over half foam roll for bil UE horz abd x 10 Manual Therapy P/ROM in supine to Rt shoulder into flexion, abd and D2 with scapular depression throughout MFR to Rt chest wall superior to incision and at lateral trunk  10/20/22 PROM Rt shoulder into flexion, abduction, D2, and ER with STM to the pectoralis throughout and in neutral with cocoa butter. Gentle skin movement around incision with minimal skin mobility and slide over radiated  skin.  Pulleys into flexion and abduction/scaption x each  10/13/22- created chip pack for pt to wear in her compression wrap for additional compression   PATIENT EDUCATION:  Education details: be careful with arm - avoid lifting or repetitive motion over the next several days to decrease risk of seroma forming Person educated: Patient Education method: Explanation Education comprehension: verbalized understanding  HOME EXERCISE PROGRAM: Wear chip pack in compression wrap  ASSESSMENT:  CLINICAL IMPRESSION: Pts ROM continues to improve. She still gets pinching pain in the shoulder and intermittently the scapular area and she requires multiple VC's to  prevent scapular hiking with activities.  OBJECTIVE IMPAIRMENTS: decreased ROM, decreased strength, increased fascial restrictions, impaired UE functional use, postural dysfunction, and pain.   ACTIVITY LIMITATIONS: carrying, lifting, sleeping, and reach over head  PARTICIPATION LIMITATIONS: cleaning, community activity, and occupation  PERSONAL FACTORS: Past/current experiences are also affecting patient's functional outcome.   REHAB POTENTIAL: Good  CLINICAL DECISION MAKING: Stable/uncomplicated  EVALUATION COMPLEXITY: Low  GOALS: Goals reviewed with patient? Yes  SHORT TERM GOALS=LONG TERM GOALS Target date: 11/10/22  Pt will demonstrate 165 degrees of R shoulder flexion to allow her to reach overhead. Baseline: 152 Goal status: INITIAL  2.  Pt will demonstrate 165 degrees of R shoulder abduction to allow her to reach out to the side.  Baseline: 122 Goal status: INITIAL  3.  Pt will report a 75% improvement in tightness across R chest to allow improved comfort.  Baseline:  Goal status: INITIAL  4.  Pt will be independent in a home exercise program for continued stretching and strengthening.  Baseline:  Goal status: INITIAL   PLAN:  PT FREQUENCY: 2x/week  PT DURATION: 4 weeks  PLANNED INTERVENTIONS: Therapeutic exercises, Therapeutic activity, Patient/Family education, Self Care, Joint mobilization, Manual lymph drainage, Compression bandaging, scar mobilization, Taping, Vasopneumatic device, and Manual therapy  PLAN FOR NEXT SESSION: cont PROM to R shoulder, pulleys and ball, STM to R pec, Rt pec stretching, MFR to axilla  Waynette Buttery, PT 10/27/2022, 10:53 AM

## 2022-10-28 ENCOUNTER — Ambulatory Visit: Payer: Managed Care, Other (non HMO) | Admitting: Student

## 2022-10-28 VITALS — BP 134/83 | HR 96 | Temp 98.3°F | Resp 18

## 2022-10-28 DIAGNOSIS — Z9889 Other specified postprocedural states: Secondary | ICD-10-CM

## 2022-10-28 NOTE — Progress Notes (Signed)
Patient is a 57 year old female with history of right-sided breast cancer status post unilateral mastectomy and radiation with implant-based reconstruction performed on 05/05/2022 now status post removal of implant performed 07/27/2022 and subsequent washout with debridement of ADM and drain placement on 09/22/2022 by Dr. Ladona Ridgel.  She presents to the clinic today for further postoperative follow-up.  Patient was last seen in the clinic on 10/12/2022.  At this visit, patient reported she was doing well.  She felt that she had improvement in her breast and her drain had been putting out minimal drainage.  Patient also felt she had some limited range of motion to her right upper extremity.  On exam, there was some mild erythema near the drain insertion site.  Incision was intact.  There was some residual Dermabond noted.  JP drain was removed without difficulty.  Plan was for patient to apply Vaseline to her drain site and her incision daily.  Physical therapy was also ordered for the patient.  Today, patient reports she is doing well.  She states that she feels her right breast has improved and there are no concerns for infection or seroma.  She does state that she has been working with physical therapy on her range of motion and feels that this is helping.  She denies any fevers or chills.  She denies any other issues or concerns at this time.  Chaperone present on exam.  On exam, patient is sitting upright in no acute distress.  Incision is intact and healing well.  There is no sign of infection on exam.  There is no fluid collection palpated on exam.  It does appear that the skin is scarred down to the chest wall.  There are no signs of infection on exam.  Discussed with the patient that she is free of restrictions as of next week as she will be 6 weeks out.  Patient expressed understanding.  Discussed with the patient to continue to work on her range of motion, especially given that she is fairly scarred  down.  Patient expressed understanding.  Recommended continue Vaseline to the incision for another few weeks or so, and then she may transition to scar creams.  Discussed scar cream such as Mederma, silicone based products and tapes.  Patient expressed understanding.  Patient to follow back up in 2 months with Dr. Ladona Ridgel to discuss next steps.  I instructed the patient to call in the meantime she has any questions or concerns about anything.  Pictures were obtained of the patient and placed in the chart with the patient's or guardian's permission.

## 2022-10-29 ENCOUNTER — Ambulatory Visit: Payer: Managed Care, Other (non HMO)

## 2022-10-29 DIAGNOSIS — R293 Abnormal posture: Secondary | ICD-10-CM

## 2022-10-29 DIAGNOSIS — Z483 Aftercare following surgery for neoplasm: Secondary | ICD-10-CM

## 2022-10-29 DIAGNOSIS — M25611 Stiffness of right shoulder, not elsewhere classified: Secondary | ICD-10-CM

## 2022-10-29 DIAGNOSIS — M25511 Pain in right shoulder: Secondary | ICD-10-CM

## 2022-10-29 DIAGNOSIS — Z171 Estrogen receptor negative status [ER-]: Secondary | ICD-10-CM

## 2022-10-29 NOTE — Therapy (Signed)
OUTPATIENT PHYSICAL THERAPY  UPPER EXTREMITY ONCOLOGY TREATMENT  Patient Name: Robin Farley MRN: [id number] DOB:[birth year], 57 y.o., female Today's Date: 10/29/2022  END OF SESSION:  PT End of Session - 10/29/22 1003     Visit Number 5    Number of Visits 9    Date for PT Re-Evaluation 11/10/22    PT Start Time 1004    PT Stop Time 1056    PT Time Calculation (min) 52 min    Activity Tolerance Patient tolerated treatment well    Behavior During Therapy [facility] for tasks assessed/performed             Past Medical History:  Diagnosis Date   Abdominal pain, generalized 12/27/2008   Acute sinusitis, unspecified 02/26/2007   ADD 03/13/2010   ALLERGIC RHINITIS 02/23/2007   ANEMIA 02/05/2007   Anxiety 11/17/2011   ATTENTION DEFICIT DISORDER, HX OF 02/05/2007   Breast cancer (HCC)    Deviated nasal septum 02/05/2007   Family history of breast cancer 11/20/2020   Family history of melanoma 11/20/2020   FATIGUE 02/23/2007   HYPERLIPIDEMIA 02/23/2007   Personal history of malignant melanoma 11/20/2020   Port-A-Cath in place 12/03/2020   SWELLING MASS OR LUMP IN HEAD AND NECK 07/27/2008   UTI 02/23/2007   Past Surgical History:  Procedure Laterality Date   BREAST IMPLANT REMOVAL Right 07/27/2022   Procedure: Removal of right breast implant, washout, drain placemen;  Surgeon: Santiago Glad, MD;  Location: [facility];  Service: Plastics;  Laterality: Right;   BREAST RECONSTRUCTION WITH PLACEMENT OF TISSUE EXPANDER AND FLEX HD (ACELLULAR HYDRATED DERMIS) Right 05/08/2021   Procedure: BREAST RECONSTRUCTION WITH PLACEMENT OF TISSUE EXPANDER AND FLEX HD (ACELLULAR HYDRATED DERMIS);  Surgeon: Allena Napoleon, MD;  Location: [facility];  Service: Plastics;  Laterality: Right;   BROW LIFT     CESAREAN SECTION     COSMETIC SURGERY  1995   Nasal   INCISION AND DRAINAGE OF WOUND Right 09/22/2022   Procedure: right breast washout, debridement, drain placement;  Surgeon:  Santiago Glad, MD;  Location: [facility];  Service: Plastics;  Laterality: Right;   MASTECTOMY W/ SENTINEL NODE BIOPSY Right 05/08/2021   Procedure: RIGHT MASTECTOMY WITH SENTINEL LYMPH NODE BIOPSY;  Surgeon: Manus Rudd, MD;  Location: [facility];  Service: General;  Laterality: Right;   MELANOMA EXCISION WITH SENTINEL LYMPH NODE BIOPSY Left 10/24/2012   Procedure: MELANOMA wide EXCISION left lateral thigh WITH SENTINEL LYMPH NODE BIOPSY left groin;  Surgeon: Mariella Saa, MD;  Location: [facility];  Service: General;  Laterality: Left;   NASAL SEPTUM SURGERY     PORTACATH PLACEMENT Right 12/02/2020   Procedure: INSERTION PORT-A-CATH;  Surgeon: Manus Rudd, MD;  Location: [facility];  Service: General;  Laterality: Right;   RADIOACTIVE SEED GUIDED AXILLARY SENTINEL LYMPH NODE Right 05/08/2021   Procedure: RADIOACTIVE SEED GUIDED AXILLARY SENTINEL LYMPH NODE DISSECTION;  Surgeon: Manus Rudd, MD;  Location: [facility];  Service: General;  Laterality: Right;   REMOVAL OF TISSUE EXPANDER AND PLACEMENT OF IMPLANT Right 05/05/2022   Procedure: REMOVAL OF TISSUE EXPANDER AND PLACEMENT OF IMPLANT;  Surgeon: Santiago Glad, MD;  Location: [facility];  Service: Plastics;  Laterality: Right;   Patient Active Problem List   Diagnosis Date Noted   Elevated LFTs 12/10/2020   Genetic testing 12/06/2020   Family history of breast cancer 11/20/2020   Personal history of  malignant melanoma 11/20/2020   Family history of melanoma 11/20/2020   Malignant neoplasm of upper-outer quadrant of right breast in female, estrogen receptor negative (HCC) 11/12/2020   Vitamin D deficiency 06/02/2019   Hyperglycemia 06/02/2019   Blood pressure elevated without history of HTN 06/02/2019   Headache 12/16/2018   Urinary incontinence, urge 12/14/2017   Calcific bursitis of shoulder 07/10/2015   Nausea alone  04/27/2013   Melanoma of thigh (HCC) 10/06/2012   Anxiety 11/17/2011   Rosacea 07/08/2011   Right shoulder pain 07/08/2011   Knee effusion, left 08/18/2010   Encounter for well adult exam with abnormal findings 08/18/2010   Attention deficit disorder 03/13/2010   GERD 12/27/2008   Hyperlipidemia 02/23/2007   DEPRESSION 02/23/2007   ALLERGIC RHINITIS 02/23/2007   FATIGUE 02/23/2007   ANEMIA 02/05/2007   Deviated nasal septum 02/05/2007    PCP: Oliver Barre, MD  REFERRING PROVIDER: Laurena Spies, PA-C  REFERRING DIAG: [id number] (ICD-10-CM) - History of breast reconstruction  THERAPY DIAG:  Stiffness of right shoulder, not elsewhere classified  Acute pain of right shoulder  Aftercare following surgery for neoplasm  Abnormal posture  Malignant neoplasm of upper-outer quadrant of right breast in female, estrogen receptor negative (HCC)  ONSET DATE: 09/22/22  Rationale for Evaluation and Treatment: Rehabilitation  SUBJECTIVE:                                                                                                                                                                                           SUBJECTIVE STATEMENT:  Shoulder still pinches a little but it is doing OK. I can move things and reach things off of taller shelves .   EVAL: I got an infection in the implant and I had to have surgery to have it taken out. They just took the drain out yesterday. It hurts at end range when moving the arm.   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  She had neoadjuvant chemo, and had  a right mastectomy with immediate expander on 05/08/2021. Completed radiation. Had implant placed on 05/05/22.  Had implant removed on 07/27/22 due to infection. On 09/21/12 had another surgery for debridement and drain placement. Drain removed 10/12/22.  PAIN:  Are you having pain? No, just an  occasional pinch in my Rt scapula with a particular movement  PRECAUTIONS: Other: R UE lymphedema risk  WEIGHT BEARING RESTRICTIONS: No  FALLS:  Has patient fallen in last 6 months? No  LIVING ENVIRONMENT: Lives with: lives with their spouse Lives in: House/apartment Stairs: Yes; Internal: 15  steps; on left going up Has following equipment at home:  none  OCCUPATION: Agricultural consultant for [employer], full time  LEISURE: Did exercise prior to surgery but has not been able to since  HAND DOMINANCE: right   PRIOR LEVEL OF FUNCTION: Independent  PATIENT GOALS: to have ROM back, decrease tightness   OBJECTIVE:  COGNITION: Overall cognitive status: Within functional limits for tasks assessed   PALPATION: Increased tightness scar tissue at lateral incision extending to axilla  OBSERVATIONS / OTHER ASSESSMENTS: healing mastectomy scar with glue still intact, bandage over drain site  POSTURE: forward head, rounded shoulders  UPPER EXTREMITY AROM/PROM:  A/PROM RIGHT   eval  RIGHT 10/29/2022  Shoulder extension 75   Shoulder flexion 152 153 + with lowering  Shoulder abduction 122 with pain 156 + mid range and resolves  Shoulder internal rotation 62 with pain   Shoulder external rotation 84     (Blank rows = not tested)  A/PROM LEFT   eval  Shoulder extension 75  Shoulder flexion 167  Shoulder abduction 180  Shoulder internal rotation 60  Shoulder external rotation 83    (Blank rows = not tested)  UPPER EXTREMITY STRENGTH:   LYMPHEDEMA ASSESSMENTS:  SURGERY TYPE/DATE: right mastectomy with immediate expander on 05/08/2021. Had implant placed on 05/05/22.  Had implant removed on 07/27/22 due to infection. On 09/21/12 had another surgery for debridement and drain placement.  NUMBER OF LYMPH NODES REMOVED: 2/4 CHEMOTHERAPY: neoadjuvant completed RADIATION:completed HORMONE TREATMENT: none INFECTIONS: infection after implant placement with removal on  07/27/22 and subsequent surgery on 09/22/22 for debridement  TODAY'S TREATMENT:                                                                                                                                          DATE:  10/29/2022 Pulleys for flexion and abduction x Yellow PB rolls up wall with stretch x 10, x 5 for abduction Supine on half foam roll;3 D bilateral AROM;flex, scaption, horizontal abd x 5 Manual Therapy STM to right pecs, UT, lateral trunk with cocoa butter P/ROM in supine to Rt shoulder into flexion, abd and D2 with scapular depression throughout MFR to Rt chest wall superior to incision and at lateral trunk   10/27/2022 pulleys into flexion and abd x 2 mins each returning therapist demo Roll yellowPw ball up wall into flexion and Rt UE abd x 10 each\ LTR x 5 knees to the left with arms outstretched. Manual Therapy STM to right pecs, UT, lateral trunk with cocoa butter P/ROM in supine to Rt shoulder into flexion, abd and D2 with scapular depression throughout MFR to Rt chest wall superior to incision and at lateral trunk   10/22/22 Therapeutic Exercises pulleys into flexion and abd x 2 mins each returning therapist demo Roll yelloPw ball up wall into flexion and Rt UE abd x 10 each  Supine  over half foam roll for bil UE horz abd x 10 Manual Therapy P/ROM in supine to Rt shoulder into flexion, abd and D2 with scapular depression throughout MFR to Rt chest wall superior to incision and at lateral trunk  10/20/22 PROM Rt shoulder into flexion, abduction, D2, and ER with STM to the pectoralis throughout and in neutral with cocoa butter. Gentle skin movement around incision with minimal skin mobility and slide over radiated skin.  Pulleys into flexion and abduction/scaption x each  10/13/22- created chip pack for pt to wear in her compression wrap for additional compression   PATIENT EDUCATION:  Education details: be careful with arm - avoid lifting or  repetitive motion over the next several days to decrease risk of seroma forming Person educated: Patient Education method: Explanation Education comprehension: verbalized understanding  HOME EXERCISE PROGRAM: Wear chip pack in compression wrap  ASSESSMENT:  CLINICAL IMPRESSION: Pts ROM continues to improve and abduction has improved from 122 to 156. She continues to get intermittent impingement type pain that resolves when she depresses her scapula. She is compliant with HEP.  OBJECTIVE IMPAIRMENTS: decreased ROM, decreased strength, increased fascial restrictions, impaired UE functional use, postural dysfunction, and pain.   ACTIVITY LIMITATIONS: carrying, lifting, sleeping, and reach over head  PARTICIPATION LIMITATIONS: cleaning, community activity, and occupation  PERSONAL FACTORS: Past/current experiences are also affecting patient's functional outcome.   REHAB POTENTIAL: Good  CLINICAL DECISION MAKING: Stable/uncomplicated  EVALUATION COMPLEXITY: Low  GOALS: Goals reviewed with patient? Yes  SHORT TERM GOALS=LONG TERM GOALS Target date: 11/10/22  Pt will demonstrate 165 degrees of R shoulder flexion to allow her to reach overhead. Baseline: 152 Goal status: INITIAL  2.  Pt will demonstrate 165 degrees of R shoulder abduction to allow her to reach out to the side.  Baseline: 122 Goal status: INITIAL  3.  Pt will report a 75% improvement in tightness across R chest to allow improved comfort.  Baseline:  Goal status: INITIAL  4.  Pt will be independent in a home exercise program for continued stretching and strengthening.  Baseline:  Goal status: INITIAL   PLAN:  PT FREQUENCY: 2x/week  PT DURATION: 4 weeks  PLANNED INTERVENTIONS: Therapeutic exercises, Therapeutic activity, Patient/Family education, Self Care, Joint mobilization, Manual lymph drainage, Compression bandaging, scar mobilization, Taping, Vasopneumatic device, and Manual therapy  PLAN FOR NEXT  SESSION: cont PROM to R shoulder, pulleys and ball, STM to R pec, Rt pec stretching, MFR to axilla  Waynette Buttery, PT 10/29/2022, 10:58 AM

## 2022-10-30 ENCOUNTER — Inpatient Hospital Stay: Payer: Managed Care, Other (non HMO) | Attending: Hematology and Oncology | Admitting: Hematology and Oncology

## 2022-10-30 ENCOUNTER — Other Ambulatory Visit: Payer: Self-pay

## 2022-10-30 VITALS — BP 124/57 | HR 68 | Temp 97.9°F | Resp 20 | Wt 171.5 lb

## 2022-10-30 DIAGNOSIS — C50411 Malignant neoplasm of upper-outer quadrant of right female breast: Secondary | ICD-10-CM

## 2022-10-30 DIAGNOSIS — M545 Low back pain, unspecified: Secondary | ICD-10-CM | POA: Diagnosis not present

## 2022-10-30 DIAGNOSIS — Z853 Personal history of malignant neoplasm of breast: Secondary | ICD-10-CM | POA: Insufficient documentation

## 2022-10-30 DIAGNOSIS — Z171 Estrogen receptor negative status [ER-]: Secondary | ICD-10-CM | POA: Diagnosis not present

## 2022-10-30 DIAGNOSIS — Z9011 Acquired absence of right breast and nipple: Secondary | ICD-10-CM | POA: Diagnosis not present

## 2022-10-30 DIAGNOSIS — Z9221 Personal history of antineoplastic chemotherapy: Secondary | ICD-10-CM | POA: Diagnosis not present

## 2022-10-30 DIAGNOSIS — R519 Headache, unspecified: Secondary | ICD-10-CM | POA: Insufficient documentation

## 2022-10-30 NOTE — Progress Notes (Signed)
BRIEF ONCOLOGIC HISTORY:  Oncology History  Malignant neoplasm of upper-outer quadrant of right breast in female, estrogen receptor negative (HCC)  11/12/2020 Initial Diagnosis   Malignant neoplasm of upper-outer quadrant of right breast in female, estrogen receptor positive (HCC)   11/20/2020 Cancer Staging   Staging form: Breast, AJCC 8th Edition - Clinical: Stage IIB (cT1c, cN1, cM0, G3, ER-, PR-, HER2-) - Signed by Lowella Dell, MD on 11/20/2020 Histologic grading system: 3 grade system   12/03/2020 - 04/10/2021 Chemotherapy   Patient is on Treatment Plan : BREAST Pembrolizumab + Carboplatin D1 + Paclitaxel D1,8,15 q21d X 4 cycles / Pembrolizumab + AC q21d x 4 cycles     12/03/2020 Genetic Testing   Negative hereditary cancer genetic testing: no pathogenic variants detected in [facility] CancerNext-Expanded +RNAinsight Panel.  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  The report date is December 03, 2020.   The CancerNext-Expanded gene panel offered by [facility] and includes sequencing, rearrangement, and RNA analysis for the following 77 genes: AIP, ALK, APC, ATM, AXIN2, BAP1, BARD1, BLM, BMPR1A, BRCA1, BRCA2, BRIP1, CDC73, CDH1, CDK4, CDKN1B, CDKN2A, CHEK2, CTNNA1, DICER1, FANCC, FH, FLCN, GALNT12, KIF1B, LZTR1, MAX, MEN1, MET, MLH1, MSH2, MSH3, MSH6, MUTYH, NBN, NF1, NF2, NTHL1, PALB2, PHOX2B, PMS2, POT1, PRKAR1A, PTCH1, PTEN, RAD51C, RAD51D, RB1, RECQL, RET, SDHA, SDHAF2, SDHB, SDHC, SDHD, SMAD4, SMARCA4, SMARCB1, SMARCE1, STK11, SUFU, TMEM127, TP53, TSC1, TSC2, VHL and XRCC2 (sequencing and deletion/duplication); EGFR, EGLN1, HOXB13, KIT, MITF, PDGFRA, POLD1, and POLE (sequencing only); EPCAM and GREM1 (deletion/duplication only).    04/29/2021 - 11/04/2021 Chemotherapy   Patient is on Treatment Plan : BREAST Pembrolizumab (200) q21d x 27 weeks     05/08/2021 Cancer Staging   Staging form: Breast, AJCC 8th Edition - Pathologic stage from 05/08/2021:  No Stage Recommended (ypT2, pN1a, cM0, G3, ER-, PR-, HER2-) - Signed by Loa Socks, NP on 05/21/2021 Stage prefix: Post-therapy Histologic grading system: 3 grade system   05/08/2021 Surgery   She had right mastectomy and targeted right axillary dissection on 05/08/2021.  Pathology showed 2.3 x 1.7 x 1.0 cm invasive ductal carcinoma, grade 3 of 3, treatment effect was robust in the breast and minimal in lymph nodes.  All surgical margins negative for invasive carcinoma, out of 4 examined the lymph nodes, 2 with micrometastasis and 0 with micrometastasis or isolated tumor cells.  Largest metastatic deposit in the lymph node was 4 mm and extranodal extension was present.   06/23/2021 - 07/31/2021 Radiation Therapy   06/23/2021 through 07/31/2021 Site Technique Total Dose (Gy) Dose per Fx (Gy) Completed Fx Beam Energies  Chest Wall, Right: CW_R_IMN 3D 50.4/50.4 1.8 28/28 6X  Chest Wall, Right: CW_R_PAB_SCV 3D 50.4/50.4 1.8 28/28 6X, 10X       INTERVAL HISTORY:   Robin Farley is here for follow up. Since last visit, she continues to do well. She is just dealing with plastic surgery issues, now seen by Dr Ladona Ridgel. She otherwise has some mild lower back pain, intermittently. She has some intermittent headaches. She denies any breast changes, cough, chest pain, change in bowel habits or urinary habits She is working with therapist, inquired about disability since she is not as sharp as she was. Rest of the pertinent 10 point ROS reviewed and neg.  REVIEW OF SYSTEMS:  Review of Systems  Constitutional:  Negative for appetite change, chills, fatigue, fever and unexpected weight change.  HENT:   Negative for hearing loss, lump/mass and trouble swallowing.  Eyes:  Negative for eye problems and icterus.  Respiratory:  Negative for chest tightness, cough and shortness of breath.   Cardiovascular:  Negative for chest pain, leg swelling and palpitations.  Gastrointestinal:  Negative for abdominal  distention, abdominal pain, constipation, diarrhea, nausea and vomiting.  Endocrine: Negative for hot flashes.  Genitourinary:  Negative for difficulty urinating.   Musculoskeletal:  Positive for back pain. Negative for arthralgias.  Skin:  Negative for itching and rash.  Neurological:  Negative for dizziness, extremity weakness, headaches and numbness.  Hematological:  Negative for adenopathy. Does not bruise/bleed easily.  Psychiatric/Behavioral:  Negative for depression. The patient is not nervous/anxious.   Breast: Denies any new nodularity, masses, tenderness, nipple changes, or nipple discharge.    ONCOLOGY TREATMENT TEAM:  1. Surgeon:  Dr. Corliss Skains at [facility] 2. Medical Oncologist: Dr. Al Pimple  3. Radiation Oncologist: Dr. Basilio Cairo    PAST MEDICAL/SURGICAL HISTORY:  Past Medical History:  Diagnosis Date   Abdominal pain, generalized 12/27/2008   Acute sinusitis, unspecified 02/26/2007   ADD 03/13/2010   ALLERGIC RHINITIS 02/23/2007   ANEMIA 02/05/2007   Anxiety 11/17/2011   ATTENTION DEFICIT DISORDER, HX OF 02/05/2007   Breast cancer (HCC)    Deviated nasal septum 02/05/2007   Family history of breast cancer 11/20/2020   Family history of melanoma 11/20/2020   FATIGUE 02/23/2007   HYPERLIPIDEMIA 02/23/2007   Personal history of malignant melanoma 11/20/2020   Port-A-Cath in place 12/03/2020   SWELLING MASS OR LUMP IN HEAD AND NECK 07/27/2008   UTI 02/23/2007   Past Surgical History:  Procedure Laterality Date   BREAST IMPLANT REMOVAL Right 07/27/2022   Procedure: Removal of right breast implant, washout, drain placemen;  Surgeon: Santiago Glad, MD;  Location: [facility];  Service: Plastics;  Laterality: Right;   BREAST RECONSTRUCTION WITH PLACEMENT OF TISSUE EXPANDER AND FLEX HD (ACELLULAR HYDRATED DERMIS) Right 05/08/2021   Procedure: BREAST RECONSTRUCTION WITH PLACEMENT OF TISSUE EXPANDER AND FLEX HD (ACELLULAR HYDRATED DERMIS);  Surgeon: Allena Napoleon,  MD;  Location: [facility];  Service: Plastics;  Laterality: Right;   BROW LIFT     CESAREAN SECTION     COSMETIC SURGERY  1995   Nasal   INCISION AND DRAINAGE OF WOUND Right 09/22/2022   Procedure: right breast washout, debridement, drain placement;  Surgeon: Santiago Glad, MD;  Location: [facility];  Service: Plastics;  Laterality: Right;   MASTECTOMY W/ SENTINEL NODE BIOPSY Right 05/08/2021   Procedure: RIGHT MASTECTOMY WITH SENTINEL LYMPH NODE BIOPSY;  Surgeon: Manus Rudd, MD;  Location: [facility];  Service: General;  Laterality: Right;   MELANOMA EXCISION WITH SENTINEL LYMPH NODE BIOPSY Left 10/24/2012   Procedure: MELANOMA wide EXCISION left lateral thigh WITH SENTINEL LYMPH NODE BIOPSY left groin;  Surgeon: Mariella Saa, MD;  Location: [facility];  Service: General;  Laterality: Left;   NASAL SEPTUM SURGERY     PORTACATH PLACEMENT Right 12/02/2020   Procedure: INSERTION PORT-A-CATH;  Surgeon: Manus Rudd, MD;  Location: [facility];  Service: General;  Laterality: Right;   RADIOACTIVE SEED GUIDED AXILLARY SENTINEL LYMPH NODE Right 05/08/2021   Procedure: RADIOACTIVE SEED GUIDED AXILLARY SENTINEL LYMPH NODE DISSECTION;  Surgeon: Manus Rudd, MD;  Location: [facility];  Service: General;  Laterality: Right;   REMOVAL OF TISSUE EXPANDER AND PLACEMENT OF IMPLANT Right 05/05/2022   Procedure: REMOVAL OF TISSUE EXPANDER AND PLACEMENT OF IMPLANT;  Surgeon: Weyman Croon  B, MD;  Location: [facility];  Service: Plastics;  Laterality: Right;     ALLERGIES:  Allergies  Allergen Reactions   Promethazine Hcl     Legs twitch     CURRENT MEDICATIONS:  Outpatient Encounter Medications as of 10/30/2022  Medication Sig   amphetamine-dextroamphetamine (ADDERALL) 20 MG tablet Take 1.5 tabs by mouth twice per day   melatonin 5 MG TABS Take 5 mg by mouth at bedtime as needed  (sleep).   ondansetron (ZOFRAN) 4 MG tablet Take 1 tablet (4 mg total) by mouth every 8 (eight) hours as needed for up to 20 doses for nausea or vomiting. (Patient not taking: Reported on 10/28/2022)   ondansetron (ZOFRAN-ODT) 4 MG disintegrating tablet Take 1 tablet (4 mg total) by mouth every 8 (eight) hours as needed for nausea or vomiting. (Patient not taking: Reported on 10/28/2022)   oxyCODONE (ROXICODONE) 5 MG immediate release tablet Take 1 tablet (5 mg total) by mouth every 6 (six) hours as needed for up to 20 doses for severe pain. (Patient not taking: Reported on 10/28/2022)   TURMERIC CURCUMIN PO Take 750 mg by mouth daily.   No facility-administered encounter medications on file as of 10/30/2022.     ONCOLOGIC FAMILY HISTORY:  Family History  Problem Relation Age of Onset   Hypertension Mother    Breast cancer Mother 32   COPD Father    Atrial fibrillation Father    Atrial fibrillation Sister    Kidney cancer Maternal Aunt        dx 64s   Leukemia Paternal Aunt        dx after 37   Cancer Paternal Aunt        unknown type; dx after 44   Brain cancer Paternal Grandfather 70       brain tumor per pt   Melanoma Cousin        arm; no mets per pt   Alcohol abuse Other    Depression Other    Glaucoma Other    Breast cancer Other        MGM's sisters, x3, dx after 70   Colon polyps Neg Hx      SOCIAL HISTORY:  Social History   Socioeconomic History   Marital status: Married    Spouse name: Geologist, engineering   Number of children: 2   Years of education: Not on file   Highest education level: Not on file  Occupational History   Occupation: Education administrator in fine arts [employer]    Employer: [employer]  Tobacco Use   Smoking status: Never   Smokeless tobacco: Never  Vaping Use   Vaping status: Never Used  Substance and Sexual Activity   Alcohol use: Not Currently    Comment: Very rare occasions   Drug use: Never   Sexual activity: Yes    Birth control/protection:  None, Post-menopausal  Other Topics Concern   Not on file  Social History Narrative   Not on file   Social Determinants of Health   Financial Resource Strain: Low Risk  (11/20/2020)   Overall Financial Resource Strain (CARDIA)    Difficulty of Paying Living Expenses: Not hard at all  Food Insecurity: No Food Insecurity (11/20/2020)   Hunger Vital Sign    Worried About Running Out of Food in the Last Year: Never true    Ran Out of Food in the Last Year: Never true  Transportation Needs: No Transportation Needs (11/20/2020)   PRAPARE - Transportation  Lack of Transportation (Medical): No    Lack of Transportation (Non-Medical): No  Physical Activity: Not on file  Stress: Not on file  Social Connections: Not on file  Intimate Partner Violence: Not on file     OBSERVATIONS/OBJECTIVE:  BP (!) 124/57   Pulse 68   Temp 97.9 F (36.6 C)   Resp 20   Wt 171 lb 8 oz (77.8 kg)   LMP 04/27/2016   SpO2 99%   BMI 27.68 kg/m   GENERAL: Patient is a well appearing female in no acute distress  NODES:  No cervical, supraclavicular, or axillary lymphadenopathy palpated.  BREAST EXAM: Status post right breast mastectomy.  No palpable concerns. No regional adenopathy LUNGS:  Clear to auscultation bilaterally.  No wheezes or rhonchi. HEART:  Regular rate and rhythm. No murmur appreciated. ABDOMEN:  Soft, nontender.  Positive, normoactive bowel sounds. No organomegaly palpated. EXTREMITIES:  No peripheral edema.   SKIN:  Clear with no obvious rashes or skin changes. No nail dyscrasia. NEURO:  Nonfocal. Well oriented.  Appropriate affect.   LABORATORY DATA:  None for this visit.  DIAGNOSTIC IMAGING:  None for this visit.      ASSESSMENT AND PLAN:   ASSESSMENT: 57 y.o. [place] woman with T1c N1, stage IIB invasive ductal carcinoma, grade 3, triple negative, with an MIB-1 of 30%              (1) genetics test 12/03/2020 through the [facility] CancerNext-Expanded +RNAinsight Panel  found no deleterious mutations in AIP, ALK, APC, ATM, AXIN2, BAP1, BARD1, BLM, BMPR1A, BRCA1, BRCA2, BRIP1, CDC73, CDH1, CDK4, CDKN1B, CDKN2A, CHEK2, CTNNA1, DICER1, FANCC, FH, FLCN, GALNT12, KIF1B, LZTR1, MAX, MEN1, MET, MLH1, MSH2, MSH3, MSH6, MUTYH, NBN, NF1, NF2, NTHL1, PALB2, PHOX2B, PMS2, POT1, PRKAR1A, PTCH1, PTEN, RAD51C, RAD51D, RB1, RECQL, RET, SDHA, SDHAF2, SDHB, SDHC, SDHD, SMAD4, SMARCA4, SMARCB1, SMARCE1, STK11, SUFU, TMEM127, TP53, TSC1, TSC2, VHL and XRCC2 (sequencing and deletion/duplication); EGFR, EGLN1, HOXB13, KIT, MITF, PDGFRA, POLD1, and POLE (sequencing only); EPCAM and GREM1 (deletion/duplication only).     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   She underwent right mastectomy and targeted lymph node dissection, final pathology showing tumor measuring 2.3 cm in largest dimension, grade 3, negative margins with excellent response in the breast however minimal response in the lymph node, 2 out of 4 lymph nodes positive for involvement, extranodal extension present, largest metastasis is 4 mm.  PLAN  There is no concern for recurrence on exam. She is due for left breast mammogram, this has been ordered. This is scheduled for 7/29. Back pain is not as bothersome. She will continue to follow up with plastic surgery for reconstruction updates. I told her that she doesn't have active cancer to our knowledge hence we may not be able to sign off on disability papers, but we will help her in any way we can RTC in 6 months or sooner as needed.  Total time  spent: 30 min  *Total Encounter Time as defined by the Centers for Medicare and Medicaid Services includes, in addition to the face-to-face time of a patient visit (documented in the  note above) non-face-to-face time: obtaining and reviewing outside history, ordering and reviewing medications, tests or procedures, care coordination (communications with other health care professionals or caregivers) and documentation in the medical record.

## 2022-11-01 ENCOUNTER — Encounter: Payer: Self-pay | Admitting: Hematology and Oncology

## 2022-11-03 ENCOUNTER — Ambulatory Visit: Payer: Managed Care, Other (non HMO)

## 2022-11-03 DIAGNOSIS — M25611 Stiffness of right shoulder, not elsewhere classified: Secondary | ICD-10-CM

## 2022-11-03 DIAGNOSIS — Z483 Aftercare following surgery for neoplasm: Secondary | ICD-10-CM

## 2022-11-03 DIAGNOSIS — M25511 Pain in right shoulder: Secondary | ICD-10-CM

## 2022-11-03 DIAGNOSIS — Z171 Estrogen receptor negative status [ER-]: Secondary | ICD-10-CM

## 2022-11-03 DIAGNOSIS — R293 Abnormal posture: Secondary | ICD-10-CM

## 2022-11-03 NOTE — Patient Instructions (Signed)
Over Head Pull: Narrow and Wide Grip   Cancer Rehab [phone]   On back, knees bent, feet flat, band across thighs, elbows straight but relaxed. Pull hands apart (start). Keeping elbows straight, bring arms up and over head, hands toward floor. Keep pull steady on band. Hold momentarily. Return slowly, keeping pull steady, back to start. Then do same with a wider grip on the band (past shoulder width) Repeat _5-10__ times. Band color __yellow____   Side Pull: Double Arm   On back, knees bent, feet flat. Arms perpendicular to body, shoulder level, elbows straight but relaxed. Pull arms out to sides, elbows straight. Resistance band comes across collarbones, hands toward floor. Hold momentarily. Slowly return to starting position. Repeat _5-10__ times. Band color _yellow____   Sword   On back, knees bent, feet flat, left hand on left hip, right hand above left. Pull right arm DIAGONALLY (hip to shoulder) across chest. Bring right arm along head toward floor. Hold momentarily. Slowly return to starting position. Repeat _5-10__ times. Do with left arm. Band color _yellow_____   Shoulder Rotation: Double Arm   On back, knees bent, feet flat, elbows tucked at sides, bent 90, hands palms up. Pull hands apart and down toward floor, keeping elbows near sides. Hold momentarily. Slowly return to starting position. Repeat _5-10__ times. Band color __yellow____    

## 2022-11-03 NOTE — Therapy (Signed)
OUTPATIENT PHYSICAL THERAPY  UPPER EXTREMITY ONCOLOGY TREATMENT  Patient Name: Robin Farley MRN: [id number] DOB:04-09-1966, 57 y.o., female Today's Date: 11/03/2022  END OF SESSION:  PT End of Session - 11/03/22 0900     Visit Number 6    Number of Visits 9    Date for PT Re-Evaluation 11/10/22    PT Start Time 0901    PT Stop Time 0952    PT Time Calculation (min) 51 min    Activity Tolerance Patient tolerated treatment well    Behavior During Therapy [facility] for tasks assessed/performed             Past Medical History:  Diagnosis Date   Abdominal pain, generalized 12/27/2008   Acute sinusitis, unspecified 02/26/2007   ADD 03/13/2010   ALLERGIC RHINITIS 02/23/2007   ANEMIA 02/05/2007   Anxiety 11/17/2011   ATTENTION DEFICIT DISORDER, HX OF 02/05/2007   Breast cancer (HCC)    Deviated nasal septum 02/05/2007   Family history of breast cancer 11/20/2020   Family history of melanoma 11/20/2020   FATIGUE 02/23/2007   HYPERLIPIDEMIA 02/23/2007   Personal history of malignant melanoma 11/20/2020   Port-A-Cath in place 12/03/2020   SWELLING MASS OR LUMP IN HEAD AND NECK 07/27/2008   UTI 02/23/2007   Past Surgical History:  Procedure Laterality Date   BREAST IMPLANT REMOVAL Right 07/27/2022   Procedure: Removal of right breast implant, washout, drain placemen;  Surgeon: Santiago Glad, MD;  Location: [facility];  Service: Plastics;  Laterality: Right;   BREAST RECONSTRUCTION WITH PLACEMENT OF TISSUE EXPANDER AND FLEX HD (ACELLULAR HYDRATED DERMIS) Right 05/08/2021   Procedure: BREAST RECONSTRUCTION WITH PLACEMENT OF TISSUE EXPANDER AND FLEX HD (ACELLULAR HYDRATED DERMIS);  Surgeon: Allena Napoleon, MD;  Location: [facility];  Service: Plastics;  Laterality: Right;   BROW LIFT     CESAREAN SECTION     COSMETIC SURGERY  1995   Nasal   INCISION AND DRAINAGE OF WOUND Right 09/22/2022   Procedure: right breast washout, debridement, drain placement;  Surgeon:  Santiago Glad, MD;  Location: [facility];  Service: Plastics;  Laterality: Right;   MASTECTOMY W/ SENTINEL NODE BIOPSY Right 05/08/2021   Procedure: RIGHT MASTECTOMY WITH SENTINEL LYMPH NODE BIOPSY;  Surgeon: Manus Rudd, MD;  Location: [facility];  Service: General;  Laterality: Right;   MELANOMA EXCISION WITH SENTINEL LYMPH NODE BIOPSY Left 10/24/2012   Procedure: MELANOMA wide EXCISION left lateral thigh WITH SENTINEL LYMPH NODE BIOPSY left groin;  Surgeon: Mariella Saa, MD;  Location: [facility];  Service: General;  Laterality: Left;   NASAL SEPTUM SURGERY     PORTACATH PLACEMENT Right 12/02/2020   Procedure: INSERTION PORT-A-CATH;  Surgeon: Manus Rudd, MD;  Location: [facility];  Service: General;  Laterality: Right;   RADIOACTIVE SEED GUIDED AXILLARY SENTINEL LYMPH NODE Right 05/08/2021   Procedure: RADIOACTIVE SEED GUIDED AXILLARY SENTINEL LYMPH NODE DISSECTION;  Surgeon: Manus Rudd, MD;  Location: [facility];  Service: General;  Laterality: Right;   REMOVAL OF TISSUE EXPANDER AND PLACEMENT OF IMPLANT Right 05/05/2022   Procedure: REMOVAL OF TISSUE EXPANDER AND PLACEMENT OF IMPLANT;  Surgeon: Santiago Glad, MD;  Location: [facility];  Service: Plastics;  Laterality: Right;   Patient Active Problem List   Diagnosis Date Noted   Elevated LFTs 12/10/2020   Genetic testing 12/06/2020   Family history of breast cancer 11/20/2020   Personal history of  malignant melanoma 11/20/2020   Family history of melanoma 11/20/2020   Malignant neoplasm of upper-outer quadrant of right breast in female, estrogen receptor negative (HCC) 11/12/2020   Vitamin D deficiency 06/02/2019   Hyperglycemia 06/02/2019   Blood pressure elevated without history of HTN 06/02/2019   Headache 12/16/2018   Urinary incontinence, urge 12/14/2017   Calcific bursitis of shoulder 07/10/2015   Nausea alone  04/27/2013   Melanoma of thigh (HCC) 10/06/2012   Anxiety 11/17/2011   Rosacea 07/08/2011   Right shoulder pain 07/08/2011   Knee effusion, left 08/18/2010   Encounter for well adult exam with abnormal findings 08/18/2010   Attention deficit disorder 03/13/2010   GERD 12/27/2008   Hyperlipidemia 02/23/2007   DEPRESSION 02/23/2007   ALLERGIC RHINITIS 02/23/2007   FATIGUE 02/23/2007   ANEMIA 02/05/2007   Deviated nasal septum 02/05/2007    PCP: Oliver Barre, MD  REFERRING PROVIDER: Laurena Spies, PA-C  REFERRING DIAG: [phone] (ICD-10-CM) - History of breast reconstruction  THERAPY DIAG:  Stiffness of right shoulder, not elsewhere classified  Acute pain of right shoulder  Aftercare following surgery for neoplasm  Abnormal posture  Malignant neoplasm of upper-outer quadrant of right breast in female, estrogen receptor negative (HCC)  ONSET DATE: 09/22/22  Rationale for Evaluation and Treatment: Rehabilitation  SUBJECTIVE:                                                                                                                                                                                           SUBJECTIVE STATEMENT:   .doing better with the reaching. Do have some weird nodules in my right hand that are not painful. I ordered some pulleys and they should arrive tomorrow.  EVAL: I got an infection in the implant and I had to have surgery to have it taken out. They just took the drain out yesterday. It hurts at end range when moving the arm.   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  She had neoadjuvant chemo, and had  a right mastectomy with immediate expander on 05/08/2021. Completed radiation. Had implant placed on 05/05/22.  Had implant removed on 07/27/22 due to infection. On 09/21/12 had another surgery for debridement and drain placement. Drain removed  10/12/22.  PAIN:  Are you having pain? No real pain, a mild ache in right shoulder  PRECAUTIONS: Other: R UE lymphedema risk  WEIGHT BEARING RESTRICTIONS: No  FALLS:  Has patient fallen in last 6 months? No  LIVING ENVIRONMENT: Lives with: lives with their spouse Lives in: House/apartment Stairs: Yes; Internal:  15 steps; on left going up Has following equipment at home:  none  OCCUPATION: Agricultural consultant for [employer], full time  LEISURE: Did exercise prior to surgery but has not been able to since  HAND DOMINANCE: right   PRIOR LEVEL OF FUNCTION: Independent  PATIENT GOALS: to have ROM back, decrease tightness   OBJECTIVE:  COGNITION: Overall cognitive status: Within functional limits for tasks assessed   PALPATION: Increased tightness scar tissue at lateral incision extending to axilla  OBSERVATIONS / OTHER ASSESSMENTS: healing mastectomy scar with glue still intact, bandage over drain site  POSTURE: forward head, rounded shoulders  UPPER EXTREMITY AROM/PROM:  A/PROM RIGHT   eval  RIGHT 10/29/2022  Shoulder extension 75   Shoulder flexion 152 153 + with lowering  Shoulder abduction 122 with pain 156 + mid range and resolves  Shoulder internal rotation 62 with pain   Shoulder external rotation 84     (Blank rows = not tested)  A/PROM LEFT   eval  Shoulder extension 75  Shoulder flexion 167  Shoulder abduction 180  Shoulder internal rotation 60  Shoulder external rotation 83    (Blank rows = not tested)  UPPER EXTREMITY STRENGTH:   LYMPHEDEMA ASSESSMENTS:  SURGERY TYPE/DATE: right mastectomy with immediate expander on 05/08/2021. Had implant placed on 05/05/22.  Had implant removed on 07/27/22 due to infection. On 09/21/12 had another surgery for debridement and drain placement.  NUMBER OF LYMPH NODES REMOVED: 2/4 CHEMOTHERAPY: neoadjuvant completed RADIATION:completed HORMONE TREATMENT: none INFECTIONS: infection after implant placement  with removal on 07/27/22 and subsequent surgery on 09/22/22 for debridement  TODAY'S TREATMENT:                                                                                                                                          DATE:  11/03/2022 3 small non tender nodules right palm; no tightness or pain Pulleys x 2 min flexion and scaption, and abduction. VC to prevent UT hiking Supine on half roll; bilateral AROM Flex, scaption, horizontal abduction x5 Supine horizontal abd with yellow band x 10, bilateral ER with yellow x 10 Updated HEP with band exs instructed today and gave yellow band Manual Therapy STM to right pecs, UT, lateral trunk with cocoa butter P/ROM in supine to Rt shoulder into flexion, abd and D2 with scapular depression throughout MFR to Rt chest wall superior to incision and at lateral trunk    10/29/2022 Pulleys for flexion and abduction x Yellow PB rolls up wall with stretch x 10, x 5 for abduction Supine on half foam roll;3 D bilateral AROM;flex, scaption, horizontal abd x 5 Manual Therapy STM to right pecs, UT, lateral trunk with cocoa butter P/ROM in supine to Rt shoulder into flexion, abd and D2 with scapular depression throughout MFR to Rt chest wall superior to incision and at lateral trunk   10/27/2022 pulleys into flexion  and abd x 2 mins each returning therapist demo Roll yellowPw ball up wall into flexion and Rt UE abd x 10 each\ LTR x 5 knees to the left with arms outstretched. Manual Therapy STM to right pecs, UT, lateral trunk with cocoa butter P/ROM in supine to Rt shoulder into flexion, abd and D2 with scapular depression throughout MFR to Rt chest wall superior to incision and at lateral trunk   10/22/22 Therapeutic Exercises pulleys into flexion and abd x 2 mins each returning therapist demo Roll yelloPw ball up wall into flexion and Rt UE abd x 10 each  Supine over half foam roll for bil UE horz abd x 10 Manual Therapy P/ROM in  supine to Rt shoulder into flexion, abd and D2 with scapular depression throughout MFR to Rt chest wall superior to incision and at lateral trunk  10/20/22 PROM Rt shoulder into flexion, abduction, D2, and ER with STM to the pectoralis throughout and in neutral with cocoa butter. Gentle skin movement around incision with minimal skin mobility and slide over radiated skin.  Pulleys into flexion and abduction/scaption x each  10/13/22- created chip pack for pt to wear in her compression wrap for additional compression   PATIENT EDUCATION:  Education details: be careful with arm - avoid lifting or repetitive motion over the next several days to decrease risk of seroma forming Person educated: Patient Education method: Explanation Education comprehension: verbalized understanding  HOME EXERCISE PROGRAM: Wear chip pack in compression wrap Yellow band HA and ER, LTR  ASSESSMENT:  CLINICAL IMPRESSION: Pt making excellent progress with ROM. She continues with occasional'" pinching"with overhead activities, but is learning how to control the compensation and decrease this. She enjoyed the theraband exs and did well with them. She purchased pulleys for home use.  IMPAIRMENTS: decreased ROM, decreased strength, increased fascial restrictions, impaired UE functional use, postural dysfunction, and pain.   ACTIVITY LIMITATIONS: carrying, lifting, sleeping, and reach over head  PARTICIPATION LIMITATIONS: cleaning, community activity, and occupation  PERSONAL FACTORS: Past/current experiences are also affecting patient's functional outcome.   REHAB POTENTIAL: Good  CLINICAL DECISION MAKING: Stable/uncomplicated  EVALUATION COMPLEXITY: Low  GOALS: Goals reviewed with patient? Yes  SHORT TERM GOALS=LONG TERM GOALS Target date: 11/10/22  Pt will demonstrate 165 degrees of R shoulder flexion to allow her to reach overhead. Baseline: 152 Goal status: INITIAL  2.  Pt will demonstrate 165  degrees of R shoulder abduction to allow her to reach out to the side.  Baseline: 122 Goal status: INITIAL  3.  Pt will report a 75% improvement in tightness across R chest to allow improved comfort.  Baseline:  Goal status: INITIAL  4.  Pt will be independent in a home exercise program for continued stretching and strengthening.  Baseline:  Goal status: INITIAL   PLAN:  PT FREQUENCY: 2x/week  PT DURATION: 4 weeks  PLANNED INTERVENTIONS: Therapeutic exercises, Therapeutic activity, Patient/Family education, Self Care, Joint mobilization, Manual lymph drainage, Compression bandaging, scar mobilization, Taping, Vasopneumatic device, and Manual therapy  PLAN FOR NEXT SESSION: cont PROM to R shoulder, pulleys and ball,  review theraband and add flexion and sword as tolerated,STM to R pec, Rt pec stretching, MFR to axilla  Waynette Buttery, PT 11/03/2022, 10:01 AM

## 2022-11-04 ENCOUNTER — Other Ambulatory Visit: Payer: Self-pay | Admitting: Obstetrics and Gynecology

## 2022-11-04 DIAGNOSIS — N951 Menopausal and female climacteric states: Secondary | ICD-10-CM

## 2022-11-04 LAB — HM MAMMOGRAPHY

## 2022-11-05 ENCOUNTER — Ambulatory Visit: Payer: Managed Care, Other (non HMO)

## 2022-11-05 DIAGNOSIS — M25511 Pain in right shoulder: Secondary | ICD-10-CM

## 2022-11-05 DIAGNOSIS — R293 Abnormal posture: Secondary | ICD-10-CM

## 2022-11-05 DIAGNOSIS — M25611 Stiffness of right shoulder, not elsewhere classified: Secondary | ICD-10-CM

## 2022-11-05 DIAGNOSIS — Z171 Estrogen receptor negative status [ER-]: Secondary | ICD-10-CM

## 2022-11-05 DIAGNOSIS — Z483 Aftercare following surgery for neoplasm: Secondary | ICD-10-CM | POA: Diagnosis not present

## 2022-11-05 NOTE — Therapy (Signed)
OUTPATIENT PHYSICAL THERAPY  UPPER EXTREMITY ONCOLOGY TREATMENT  Patient Name: Robin Farley MRN: [id number] DOB:05-04-1965, 57 y.o., female Today's Date: 11/05/2022  END OF SESSION:  PT End of Session - 11/05/22 0907     Visit Number 7    Number of Visits 9    Date for PT Re-Evaluation 11/10/22    PT Start Time 0905    PT Stop Time 1002    PT Time Calculation (min) 57 min    Activity Tolerance Patient tolerated treatment well    Behavior During Therapy [facility] for tasks assessed/performed             Past Medical History:  Diagnosis Date   Abdominal pain, generalized 12/27/2008   Acute sinusitis, unspecified 02/26/2007   ADD 03/13/2010   ALLERGIC RHINITIS 02/23/2007   ANEMIA 02/05/2007   Anxiety 11/17/2011   ATTENTION DEFICIT DISORDER, HX OF 02/05/2007   Breast cancer (HCC)    Deviated nasal septum 02/05/2007   Family history of breast cancer 11/20/2020   Family history of melanoma 11/20/2020   FATIGUE 02/23/2007   HYPERLIPIDEMIA 02/23/2007   Personal history of malignant melanoma 11/20/2020   Port-A-Cath in place 12/03/2020   SWELLING MASS OR LUMP IN HEAD AND NECK 07/27/2008   UTI 02/23/2007   Past Surgical History:  Procedure Laterality Date   BREAST IMPLANT REMOVAL Right 07/27/2022   Procedure: Removal of right breast implant, washout, drain placemen;  Surgeon: Santiago Glad, MD;  Location: [facility];  Service: Plastics;  Laterality: Right;   BREAST RECONSTRUCTION WITH PLACEMENT OF TISSUE EXPANDER AND FLEX HD (ACELLULAR HYDRATED DERMIS) Right 05/08/2021   Procedure: BREAST RECONSTRUCTION WITH PLACEMENT OF TISSUE EXPANDER AND FLEX HD (ACELLULAR HYDRATED DERMIS);  Surgeon: Allena Napoleon, MD;  Location: [facility];  Service: Plastics;  Laterality: Right;   BROW LIFT     CESAREAN SECTION     COSMETIC SURGERY  1995   Nasal   INCISION AND DRAINAGE OF WOUND Right 09/22/2022   Procedure: right breast washout, debridement, drain placement;  Surgeon:  Santiago Glad, MD;  Location: [facility];  Service: Plastics;  Laterality: Right;   MASTECTOMY W/ SENTINEL NODE BIOPSY Right 05/08/2021   Procedure: RIGHT MASTECTOMY WITH SENTINEL LYMPH NODE BIOPSY;  Surgeon: Manus Rudd, MD;  Location: [facility];  Service: General;  Laterality: Right;   MELANOMA EXCISION WITH SENTINEL LYMPH NODE BIOPSY Left 10/24/2012   Procedure: MELANOMA wide EXCISION left lateral thigh WITH SENTINEL LYMPH NODE BIOPSY left groin;  Surgeon: Mariella Saa, MD;  Location: [facility];  Service: General;  Laterality: Left;   NASAL SEPTUM SURGERY     PORTACATH PLACEMENT Right 12/02/2020   Procedure: INSERTION PORT-A-CATH;  Surgeon: Manus Rudd, MD;  Location: [facility];  Service: General;  Laterality: Right;   RADIOACTIVE SEED GUIDED AXILLARY SENTINEL LYMPH NODE Right 05/08/2021   Procedure: RADIOACTIVE SEED GUIDED AXILLARY SENTINEL LYMPH NODE DISSECTION;  Surgeon: Manus Rudd, MD;  Location: [facility];  Service: General;  Laterality: Right;   REMOVAL OF TISSUE EXPANDER AND PLACEMENT OF IMPLANT Right 05/05/2022   Procedure: REMOVAL OF TISSUE EXPANDER AND PLACEMENT OF IMPLANT;  Surgeon: Santiago Glad, MD;  Location: [facility];  Service: Plastics;  Laterality: Right;   Patient Active Problem List   Diagnosis Date Noted   Elevated LFTs 12/10/2020   Genetic testing 12/06/2020   Family history of breast cancer 11/20/2020   Personal history of  malignant melanoma 11/20/2020   Family history of melanoma 11/20/2020   Malignant neoplasm of upper-outer quadrant of right breast in female, estrogen receptor negative (HCC) 11/12/2020   Vitamin D deficiency 06/02/2019   Hyperglycemia 06/02/2019   Blood pressure elevated without history of HTN 06/02/2019   Headache 12/16/2018   Urinary incontinence, urge 12/14/2017   Calcific bursitis of shoulder 07/10/2015   Nausea alone  04/27/2013   Melanoma of thigh (HCC) 10/06/2012   Anxiety 11/17/2011   Rosacea 07/08/2011   Right shoulder pain 07/08/2011   Knee effusion, left 08/18/2010   Encounter for well adult exam with abnormal findings 08/18/2010   Attention deficit disorder 03/13/2010   GERD 12/27/2008   Hyperlipidemia 02/23/2007   DEPRESSION 02/23/2007   ALLERGIC RHINITIS 02/23/2007   FATIGUE 02/23/2007   ANEMIA 02/05/2007   Deviated nasal septum 02/05/2007    PCP: Oliver Barre, MD  REFERRING PROVIDER: Laurena Spies, PA-C  REFERRING DIAG: [phone] (ICD-10-CM) - History of breast reconstruction  THERAPY DIAG:  Stiffness of right shoulder, not elsewhere classified  Acute pain of right shoulder  Aftercare following surgery for neoplasm  Abnormal posture  Malignant neoplasm of upper-outer quadrant of right breast in female, estrogen receptor negative (HCC)  ONSET DATE: 09/22/22  Rationale for Evaluation and Treatment: Rehabilitation  SUBJECTIVE:                                                                                                                                                                                           SUBJECTIVE STATEMENT:  I spoke with my oncologist about the nodules on my hand. She said to take ibuprofen 3x/day and massage the area for 3 days and let her know if it's not better after that. My Rt shoulder is getting better. I don't feel the pinch as often anymore.   EVAL: I got an infection in the implant and I had to have surgery to have it taken out. They just took the drain out yesterday. It hurts at end range when moving the arm.   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  She had neoadjuvant chemo, and had  a right mastectomy with immediate expander on 05/08/2021. Completed radiation. Had implant placed on 05/05/22.  Had implant removed on 07/27/22 due to infection. On  09/21/12 had another surgery for debridement and drain placement. Drain removed 10/12/22.  PAIN:  Are you having pain? No real pain, a mild ache in right shoulder  PRECAUTIONS: Other: R UE lymphedema risk  WEIGHT BEARING RESTRICTIONS: No  FALLS:  Has patient  fallen in last 6 months? No  LIVING ENVIRONMENT: Lives with: lives with their spouse Lives in: House/apartment Stairs: Yes; Internal: 15 steps; on left going up Has following equipment at home:  none  OCCUPATION: Agricultural consultant for [employer], full time  LEISURE: Did exercise prior to surgery but has not been able to since  HAND DOMINANCE: right   PRIOR LEVEL OF FUNCTION: Independent  PATIENT GOALS: to have ROM back, decrease tightness   OBJECTIVE:  COGNITION: Overall cognitive status: Within functional limits for tasks assessed   PALPATION: Increased tightness scar tissue at lateral incision extending to axilla  OBSERVATIONS / OTHER ASSESSMENTS: healing mastectomy scar with glue still intact, bandage over drain site  POSTURE: forward head, rounded shoulders  UPPER EXTREMITY AROM/PROM:  A/PROM RIGHT   eval  RIGHT 10/29/2022  Shoulder extension 75   Shoulder flexion 152 153 + with lowering  Shoulder abduction 122 with pain 156 + mid range and resolves  Shoulder internal rotation 62 with pain   Shoulder external rotation 84     (Blank rows = not tested)  A/PROM LEFT   eval  Shoulder extension 75  Shoulder flexion 167  Shoulder abduction 180  Shoulder internal rotation 60  Shoulder external rotation 83    (Blank rows = not tested)  UPPER EXTREMITY STRENGTH:   LYMPHEDEMA ASSESSMENTS:  SURGERY TYPE/DATE: right mastectomy with immediate expander on 05/08/2021. Had implant placed on 05/05/22.  Had implant removed on 07/27/22 due to infection. On 09/21/12 had another surgery for debridement and drain placement.  NUMBER OF LYMPH NODES REMOVED: 2/4 CHEMOTHERAPY: neoadjuvant  completed RADIATION:completed HORMONE TREATMENT: none INFECTIONS: infection after implant placement with removal on 07/27/22 and subsequent surgery on 09/22/22 for debridement  TODAY'S TREATMENT:                                                                                                                                          DATE:  11/05/22: Therapeutic Exercises Pulleys x 2 min flexion and abduction. VC to prevent UT hiking Ball roll up wall into flexion and Rt UE abd x10 each with VC's during to relax shoulders Supine over half foam roll for following: Bil UE horz abd and then bil UE scaption into a "V" x 10 each, then completed instruction of supine scapular series trial of red band with horz abd and bil er, then rest of series with yellow theraband. Pt reported no to very little pinching with these exercises. Issued red band for pt to progress to when ready at home.   Manual Therapy STM to right pecs, UT, and lateral trunk  P/ROM in supine to Rt shoulder into flexion, abd and D2 with scapular depression throughout MFR to Rt chest wall superior to incision and at lateral trunk  11/03/2022 3 small non tender nodules right palm; no tightness or pain Pulleys x 2 min flexion and scaption, and  abduction. VC to prevent UT hiking Supine on half roll; bilateral AROM Flex, scaption, horizontal abduction x5 Supine horizontal abd with yellow band x 10, bilateral ER with yellow x 10 Updated HEP with band exs instructed today and gave yellow band Manual Therapy STM to right pecs, UT, lateral trunk with cocoa butter P/ROM in supine to Rt shoulder into flexion, abd and D2 with scapular depression throughout MFR to Rt chest wall superior to incision and at lateral trunk    10/29/2022 Pulleys for flexion and abduction x Yellow PB rolls up wall with stretch x 10, x 5 for abduction Supine on half foam roll;3 D bilateral AROM;flex, scaption, horizontal abd x 5 Manual Therapy STM to right  pecs, UT, lateral trunk with cocoa butter P/ROM in supine to Rt shoulder into flexion, abd and D2 with scapular depression throughout MFR to Rt chest wall superior to incision and at lateral trunk   10/27/2022 pulleys into flexion and abd x 2 mins each returning therapist demo Roll yellowPw ball up wall into flexion and Rt UE abd x 10 each\ LTR x 5 knees to the left with arms outstretched. Manual Therapy STM to right pecs, UT, lateral trunk with cocoa butter P/ROM in supine to Rt shoulder into flexion, abd and D2 with scapular depression throughout MFR to Rt chest wall superior to incision and at lateral trunk   10/22/22 Therapeutic Exercises pulleys into flexion and abd x 2 mins each returning therapist demo Roll yelloPw ball up wall into flexion and Rt UE abd x 10 each  Supine over half foam roll for bil UE horz abd x 10 Manual Therapy P/ROM in supine to Rt shoulder into flexion, abd and D2 with scapular depression throughout MFR to Rt chest wall superior to incision and at lateral trunk  10/20/22 PROM Rt shoulder into flexion, abduction, D2, and ER with STM to the pectoralis throughout and in neutral with cocoa butter. Gentle skin movement around incision with minimal skin mobility and slide over radiated skin.  Pulleys into flexion and abduction/scaption x each  10/13/22- created chip pack for pt to wear in her compression wrap for additional compression   PATIENT EDUCATION:  Education details: be careful with arm - avoid lifting or repetitive motion over the next several days to decrease risk of seroma forming Person educated: Patient Education method: Explanation Education comprehension: verbalized understanding  HOME EXERCISE PROGRAM: Wear chip pack in compression wrap Yellow band HA and ER, LTR  ASSESSMENT:  CLINICAL IMPRESSION: Pt reports pinching is continuing to improve and she is pleased with this. Continued with and progressed supine scapular series and cont  with manual therapy. Her Rt shoulder P/ROM is improving with less pinching in general, her Rt chest is still tight around mastectomy incision that she feels pulling at end motions.   IMPAIRMENTS: decreased ROM, decreased strength, increased fascial restrictions, impaired UE functional use, postural dysfunction, and pain.   ACTIVITY LIMITATIONS: carrying, lifting, sleeping, and reach over head  PARTICIPATION LIMITATIONS: cleaning, community activity, and occupation  PERSONAL FACTORS: Past/current experiences are also affecting patient's functional outcome.   REHAB POTENTIAL: Good  CLINICAL DECISION MAKING: Stable/uncomplicated  EVALUATION COMPLEXITY: Low  GOALS: Goals reviewed with patient? Yes  SHORT TERM GOALS=LONG TERM GOALS Target date: 11/10/22  Pt will demonstrate 165 degrees of R shoulder flexion to allow her to reach overhead. Baseline: 152 Goal status: INITIAL  2.  Pt will demonstrate 165 degrees of R shoulder abduction to allow her to reach out  to the side.  Baseline: 122 Goal status: INITIAL  3.  Pt will report a 75% improvement in tightness across R chest to allow improved comfort.  Baseline:  Goal status: INITIAL  4.  Pt will be independent in a home exercise program for continued stretching and strengthening.  Baseline:  Goal status: INITIAL   PLAN:  PT FREQUENCY: 2x/week  PT DURATION: 4 weeks  PLANNED INTERVENTIONS: Therapeutic exercises, Therapeutic activity, Patient/Family education, Self Care, Joint mobilization, Manual lymph drainage, Compression bandaging, scar mobilization, Taping, Vasopneumatic device, and Manual therapy  PLAN FOR NEXT SESSION: cont PROM to R shoulder, pulleys and ball,  review theraband and add flexion and sword as tolerated,STM to R pec, Rt pec stretching, MFR to axilla  Hermenia Bers, PTA 11/05/2022, 10:04 AM

## 2022-11-09 ENCOUNTER — Ambulatory Visit
Admission: RE | Admit: 2022-11-09 | Discharge: 2022-11-09 | Disposition: A | Discharge status: Home / Self Care | Payer: Managed Care, Other (non HMO) | Source: Ambulatory Visit | Attending: Hematology and Oncology | Admitting: Hematology and Oncology

## 2022-11-09 DIAGNOSIS — C50411 Malignant neoplasm of upper-outer quadrant of right female breast: Secondary | ICD-10-CM

## 2022-11-09 HISTORY — DX: Personal history of irradiation: Z92.3

## 2022-11-09 HISTORY — DX: Personal history of antineoplastic chemotherapy: Z92.21

## 2022-11-17 ENCOUNTER — Ambulatory Visit: Payer: Managed Care, Other (non HMO) | Attending: Student

## 2022-11-17 DIAGNOSIS — C50411 Malignant neoplasm of upper-outer quadrant of right female breast: Secondary | ICD-10-CM | POA: Insufficient documentation

## 2022-11-17 DIAGNOSIS — M25611 Stiffness of right shoulder, not elsewhere classified: Secondary | ICD-10-CM | POA: Insufficient documentation

## 2022-11-17 DIAGNOSIS — Z483 Aftercare following surgery for neoplasm: Secondary | ICD-10-CM | POA: Diagnosis present

## 2022-11-17 DIAGNOSIS — Z171 Estrogen receptor negative status [ER-]: Secondary | ICD-10-CM | POA: Diagnosis present

## 2022-11-17 DIAGNOSIS — M25511 Pain in right shoulder: Secondary | ICD-10-CM | POA: Diagnosis present

## 2022-11-17 DIAGNOSIS — R293 Abnormal posture: Secondary | ICD-10-CM | POA: Diagnosis present

## 2022-11-17 NOTE — Therapy (Addendum)
OUTPATIENT PHYSICAL THERAPY  UPPER EXTREMITY ONCOLOGY TREATMENT  Patient Name: Robin Farley MRN: [id number] DOB:January 09, 1966, 57 y.o., female Today's Date: 11/17/2022  END OF SESSION:  PT End of Session - 11/17/22 0910     Visit Number 8    Number of Visits 9    Date for PT Re-Evaluation 11/10/22   D/C this visit   PT Start Time 0905    PT Stop Time 1000    PT Time Calculation (min) 55 min    Activity Tolerance Patient tolerated treatment well    Behavior During Therapy [facility] for tasks assessed/performed             Past Medical History:  Diagnosis Date   Abdominal pain, generalized 12/27/2008   Acute sinusitis, unspecified 02/26/2007   ADD 03/13/2010   ALLERGIC RHINITIS 02/23/2007   ANEMIA 02/05/2007   Anxiety 11/17/2011   ATTENTION DEFICIT DISORDER, HX OF 02/05/2007   Breast cancer (HCC)    Deviated nasal septum 02/05/2007   Family history of breast cancer 11/20/2020   Family history of melanoma 11/20/2020   FATIGUE 02/23/2007   HYPERLIPIDEMIA 02/23/2007   Personal history of chemotherapy    Personal history of malignant melanoma 11/20/2020   Personal history of radiation therapy    Port-A-Cath in place 12/03/2020   SWELLING MASS OR LUMP IN HEAD AND NECK 07/27/2008   UTI 02/23/2007   Past Surgical History:  Procedure Laterality Date   BREAST IMPLANT REMOVAL Right 07/27/2022   Procedure: Removal of right breast implant, washout, drain placemen;  Surgeon: Santiago Glad, MD;  [facility];  Service: Plastics;  Laterality: Right;   BREAST RECONSTRUCTION WITH PLACEMENT OF TISSUE EXPANDER AND FLEX HD (ACELLULAR HYDRATED DERMIS) Right 05/08/2021   Procedure: BREAST RECONSTRUCTION WITH PLACEMENT OF TISSUE EXPANDER AND FLEX HD (ACELLULAR HYDRATED DERMIS);  Surgeon: Allena Napoleon, MD;  Location: [facility];  Service: Plastics;  Laterality: Right;   BROW LIFT     CESAREAN SECTION     COSMETIC SURGERY  1995   Nasal   INCISION AND DRAINAGE OF WOUND  Right 09/22/2022   Procedure: right breast washout, debridement, drain placement;  Surgeon: Santiago Glad, MD;  Location: [facility];  Service: Plastics;  Laterality: Right;   MASTECTOMY     MASTECTOMY W/ SENTINEL NODE BIOPSY Right 05/08/2021   Procedure: RIGHT MASTECTOMY WITH SENTINEL LYMPH NODE BIOPSY;  Surgeon: Manus Rudd, MD;  Location: [facility];  Service: General;  Laterality: Right;   MELANOMA EXCISION WITH SENTINEL LYMPH NODE BIOPSY Left 10/24/2012   Procedure: MELANOMA wide EXCISION left lateral thigh WITH SENTINEL LYMPH NODE BIOPSY left groin;  Surgeon: Mariella Saa, MD;  Location: [facility];  Service: General;  Laterality: Left;   NASAL SEPTUM SURGERY     PORTACATH PLACEMENT Right 12/02/2020   Procedure: INSERTION PORT-A-CATH;  Surgeon: Manus Rudd, MD;  Location: [facility];  Service: General;  Laterality: Right;   RADIOACTIVE SEED GUIDED AXILLARY SENTINEL LYMPH NODE Right 05/08/2021   Procedure: RADIOACTIVE SEED GUIDED AXILLARY SENTINEL LYMPH NODE DISSECTION;  Surgeon: Manus Rudd, MD;  Location: [facility];  Service: General;  Laterality: Right;   REMOVAL OF TISSUE EXPANDER AND PLACEMENT OF IMPLANT Right 05/05/2022   Procedure: REMOVAL OF TISSUE EXPANDER AND PLACEMENT OF IMPLANT;  Surgeon: Santiago Glad, MD;  Location: [facility];  Service: Plastics;  Laterality: Right;   Patient Active Problem List   Diagnosis Date  Noted   Elevated LFTs 12/10/2020   Genetic testing 12/06/2020   Family history of breast cancer 11/20/2020   Personal history of malignant melanoma 11/20/2020   Family history of melanoma 11/20/2020   Malignant neoplasm of upper-outer quadrant of right breast in female, estrogen receptor negative (HCC) 11/12/2020   Vitamin D deficiency 06/02/2019   Hyperglycemia 06/02/2019   Blood pressure elevated without history of HTN 06/02/2019   Headache  12/16/2018   Urinary incontinence, urge 12/14/2017   Calcific bursitis of shoulder 07/10/2015   Nausea alone 04/27/2013   Melanoma of thigh (HCC) 10/06/2012   Anxiety 11/17/2011   Rosacea 07/08/2011   Right shoulder pain 07/08/2011   Knee effusion, left 08/18/2010   Encounter for well adult exam with abnormal findings 08/18/2010   Attention deficit disorder 03/13/2010   GERD 12/27/2008   Hyperlipidemia 02/23/2007   DEPRESSION 02/23/2007   ALLERGIC RHINITIS 02/23/2007   FATIGUE 02/23/2007   ANEMIA 02/05/2007   Deviated nasal septum 02/05/2007    PCP: Oliver Barre, MD  REFERRING PROVIDER: Laurena Spies, PA-C  REFERRING DIAG: [phone] (ICD-10-CM) - History of breast reconstruction  THERAPY DIAG:  Stiffness of right shoulder, not elsewhere classified  Acute pain of right shoulder  Aftercare following surgery for neoplasm  Abnormal posture  Malignant neoplasm of upper-outer quadrant of right breast in female, estrogen receptor negative (HCC)  ONSET DATE: 09/22/22  Rationale for Evaluation and Treatment: Rehabilitation  SUBJECTIVE:                                                                                                                                                                                           SUBJECTIVE STATEMENT:  The nodules are about the same in my hand.   EVAL: I got an infection in the implant and I had to have surgery to have it taken out. They just took the drain out yesterday. It hurts at end range when moving the arm.   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  She had neoadjuvant chemo, and had  a right mastectomy with immediate expander on 05/08/2021. Completed radiation. Had implant placed on 05/05/22.  Had implant removed on 07/27/22 due to infection. On 09/21/12 had another surgery for debridement and drain placement. Drain removed  10/12/22.  PAIN:  Are you having pain? No real pain, a mild ache in right shoulder  PRECAUTIONS: Other: R UE lymphedema risk  WEIGHT BEARING RESTRICTIONS: No  FALLS:  Has patient fallen in last 6 months? No  LIVING ENVIRONMENT: Lives with: lives with their spouse  Lives in: House/apartment Stairs: Yes; Internal: 15 steps; on left going up Has following equipment at home:  none  OCCUPATION: Agricultural consultant for [employer], full time  LEISURE: Did exercise prior to surgery but has not been able to since  HAND DOMINANCE: right   PRIOR LEVEL OF FUNCTION: Independent  PATIENT GOALS: to have ROM back, decrease tightness   OBJECTIVE:  COGNITION: Overall cognitive status: Within functional limits for tasks assessed   PALPATION: Increased tightness scar tissue at lateral incision extending to axilla  OBSERVATIONS / OTHER ASSESSMENTS: healing mastectomy scar with glue still intact, bandage over drain site  POSTURE: forward head, rounded shoulders  UPPER EXTREMITY AROM/PROM:  A/PROM RIGHT   eval  RIGHT 10/29/2022 RIGHT 11/17/22  Shoulder extension 75    Shoulder flexion 152 153 + with lowering 167  Shoulder abduction 122 with pain 156 + mid range and resolves 177  Shoulder internal rotation 62 with pain    Shoulder external rotation 84      (Blank rows = not tested)  A/PROM LEFT   eval  Shoulder extension 75  Shoulder flexion 167  Shoulder abduction 180  Shoulder internal rotation 60  Shoulder external rotation 83    (Blank rows = not tested)  UPPER EXTREMITY STRENGTH:   LYMPHEDEMA ASSESSMENTS:  SURGERY TYPE/DATE: right mastectomy with immediate expander on 05/08/2021. Had implant placed on 05/05/22.  Had implant removed on 07/27/22 due to infection. On 09/21/12 had another surgery for debridement and drain placement.  NUMBER OF LYMPH NODES REMOVED: 2/4 CHEMOTHERAPY: neoadjuvant completed RADIATION:completed HORMONE TREATMENT: none INFECTIONS:  infection after implant placement with removal on 07/27/22 and subsequent surgery on 09/22/22 for debridement  TODAY'S TREATMENT:                                                                                                                                          DATE:  11/17/22: Therapeutic Exercises Pulleys x 2 min flexion and abduction. VC to prevent UT hiking but this was improved today Ball roll up wall into flexion and Rt UE abd x10 each Instructed and added bil UE 3 way raises to HEP with pt returning therapist demo and x 10 each, 2# Manual Therapy STM to right pecs, UT, and lateral trunk ; then in Lt S/L with cocoa butter to medial scapular border and Lt UT P/ROM in supine to Rt shoulder into flexion, abd and D2 with scapular depression throughout; end motion improved after scap mobs MFR to Rt chest wall superior to incision and at lateral trunk Scap Mobs to Rt scapula into protraction and retraction  11/05/22: Therapeutic Exercises Pulleys x 2 min flexion and abduction. VC to prevent UT hiking Ball roll up wall into flexion and Rt UE abd x10 each with VC's during to relax shoulders Supine over half foam roll for following: Bil UE horz abd and then bil UE scaption into  a "V" x 10 each, then completed instruction of supine scapular series trial of red band with horz abd and bil er, then rest of series with yellow theraband. Pt reported no to very little pinching with these exercises. Issued red band for pt to progress to when ready at home.   Manual Therapy STM to right pecs, UT, and lateral trunk  P/ROM in supine to Rt shoulder into flexion, abd and D2 with scapular depression throughout MFR to Rt chest wall superior to incision and at lateral trunk  11/03/2022 3 small non tender nodules right palm; no tightness or pain Pulleys x 2 min flexion and scaption, and abduction. VC to prevent UT hiking Supine on half roll; bilateral AROM Flex, scaption, horizontal abduction x5 Supine  horizontal abd with yellow band x 10, bilateral ER with yellow x 10 Updated HEP with band exs instructed today and gave yellow band Manual Therapy STM to right pecs, UT, lateral trunk with cocoa butter P/ROM in supine to Rt shoulder into flexion, abd and D2 with scapular depression throughout MFR to Rt chest wall superior to incision and at lateral trunk   10/29/2022 Pulleys for flexion and abduction x Yellow PB rolls up wall with stretch x 10, x 5 for abduction Supine on half foam roll;3 D bilateral AROM;flex, scaption, horizontal abd x 5 Manual Therapy STM to right pecs, UT, lateral trunk with cocoa butter P/ROM in supine to Rt shoulder into flexion, abd and D2 with scapular depression throughout MFR to Rt chest wall superior to incision and at lateral trunk   10/27/2022 pulleys into flexion and abd x 2 mins each returning therapist demo Roll yellowPw ball up wall into flexion and Rt UE abd x 10 each\ LTR x 5 knees to the left with arms outstretched. Manual Therapy STM to right pecs, UT, lateral trunk with cocoa butter P/ROM in supine to Rt shoulder into flexion, abd and D2 with scapular depression throughout MFR to Rt chest wall superior to incision and at lateral trunk   10/22/22 Therapeutic Exercises pulleys into flexion and abd x 2 mins each returning therapist demo Roll yellow ball up wall into flexion and Rt UE abd x 10 each  Supine over half foam roll for bil UE horz abd x 10 Manual Therapy P/ROM in supine to Rt shoulder into flexion, abd and D2 with scapular depression throughout MFR to Rt chest wall superior to incision and at lateral trunk  10/20/22 PROM Rt shoulder into flexion, abduction, D2, and ER with STM to the pectoralis throughout and in neutral with cocoa butter. Gentle skin movement around incision with minimal skin mobility and slide over radiated skin.  Pulleys into flexion and abduction/scaption x each  10/13/22- created chip pack for pt to wear in  her compression wrap for additional compression   PATIENT EDUCATION:  Education details: be careful with arm - avoid lifting or repetitive motion over the next several days to decrease risk of seroma forming Person educated: Patient Education method: Explanation Education comprehension: verbalized understanding  HOME EXERCISE PROGRAM: Wear chip pack in compression wrap Yellow band HA and ER, LTR  ASSESSMENT:  CLINICAL IMPRESSION: Pt has done excellent this round of physical therapy and has met goals. She is ready for D/C at this time and will cont every 3 month SOZO screens.   IMPAIRMENTS: decreased ROM, decreased strength, increased fascial restrictions, impaired UE functional use, postural dysfunction, and pain.   ACTIVITY LIMITATIONS: carrying, lifting, sleeping, and reach over head  PARTICIPATION LIMITATIONS: cleaning, community activity, and occupation  PERSONAL FACTORS: Past/current experiences are also affecting patient's functional outcome.   REHAB POTENTIAL: Good  CLINICAL DECISION MAKING: Stable/uncomplicated  EVALUATION COMPLEXITY: Low  GOALS: Goals reviewed with patient? Yes  SHORT TERM GOALS=LONG TERM GOALS Target date: 11/10/22  Pt will demonstrate 165 degrees of R shoulder flexion to allow her to reach overhead. Baseline: 152; 11/17/22 - 167 Goal status: MET 2.  Pt will demonstrate 165 degrees of R shoulder abduction to allow her to reach out to the side.  Baseline: 122, 11/17/2022  177 Goal status: MET  3.  Pt will report a 75% improvement in tightness across R chest to allow improved comfort.  Baseline: 11/17/22 - 80% improvement at this time Goal status: MET  4.  Pt will be independent in a home exercise program for continued stretching and strengthening.  Baseline:  Goal status: MET   PLAN:  PT FREQUENCY: 2x/week  PT DURATION: 4 weeks  PLANNED INTERVENTIONS: Therapeutic exercises, Therapeutic activity, Patient/Family education, Self Care, Joint  mobilization, Manual lymph drainage, Compression bandaging, scar mobilization, Taping, Vasopneumatic device, and Manual therapy  PLAN FOR NEXT SESSION: D/C this visit and cont 3 month SOZO screens.  PHYSICAL THERAPY DISCHARGE SUMMARY  Visits from Start of Care: 8  Current functional level related to goals / functional outcomes: Achieved goals established   Remaining deficits: None   Education / Equipment: HEP   Patient agrees to discharge. Patient goals were met. Patient is being discharged due to meeting the stated rehab goals.  Alvira Monday, PT 11/17/22 12:46 PM   Hermenia Bers, PTA 11/17/2022, 11:07 AM   3 Way Raises:      Starting Position:  Leaning against wall, walk feet a few inches away from the wall and make tummy tight (tuck hips underneath you) Press back/shoulders/head against wall as much as possible. Keep thumbs up to ceiling, elbows straight and shoulders relaxed/down throughout.  1. Lift arms in front to shoulder height 2. Lift arms a little wider into a "V" to shoulder height 3. Lift arms out to sides in a "T" to shoulder height  Perform 10 times in each direction. Hold 1-2 lbs to start with and work up to 2-3 sets of 10/day. Perform 3-4 times/week. Increase weight as able, decreasing sets of 10 each time you increase weights, then slowly working your way back up to 2-3 sets each time.    Cancer Rehab [phone]

## 2022-11-19 ENCOUNTER — Ambulatory Visit: Payer: Managed Care, Other (non HMO)

## 2022-12-25 ENCOUNTER — Other Ambulatory Visit: Payer: Self-pay | Admitting: Obstetrics and Gynecology

## 2022-12-25 DIAGNOSIS — E2839 Other primary ovarian failure: Secondary | ICD-10-CM

## 2022-12-28 ENCOUNTER — Ambulatory Visit (INDEPENDENT_AMBULATORY_CARE_PROVIDER_SITE_OTHER): Payer: Managed Care, Other (non HMO) | Admitting: Plastic Surgery

## 2022-12-28 DIAGNOSIS — Z853 Personal history of malignant neoplasm of breast: Secondary | ICD-10-CM | POA: Diagnosis not present

## 2022-12-28 DIAGNOSIS — C50911 Malignant neoplasm of unspecified site of right female breast: Secondary | ICD-10-CM

## 2022-12-28 DIAGNOSIS — Z08 Encounter for follow-up examination after completed treatment for malignant neoplasm: Secondary | ICD-10-CM

## 2022-12-28 DIAGNOSIS — Z9889 Other specified postprocedural states: Secondary | ICD-10-CM

## 2022-12-28 DIAGNOSIS — Z923 Personal history of irradiation: Secondary | ICD-10-CM

## 2022-12-28 NOTE — Progress Notes (Signed)
Robin Farley returns today for evaluation.  She is doing well completely healed up no evidence of infection.  She is interested in discussing further options for breast reconstruction.  I believe that her primary option now is free tissue transfer.  We talked about D IEP flaps and general and she is interested in learning more.  Since we do not do those in [place] will refer her to Dr. Marita Kansas.  Follow-up with me as needed

## 2023-01-04 ENCOUNTER — Ambulatory Visit: Payer: Managed Care, Other (non HMO) | Attending: Surgery

## 2023-01-04 VITALS — Wt 171.1 lb

## 2023-01-04 DIAGNOSIS — Z483 Aftercare following surgery for neoplasm: Secondary | ICD-10-CM | POA: Insufficient documentation

## 2023-01-04 NOTE — Therapy (Signed)
OUTPATIENT PHYSICAL THERAPY SOZO SCREENING NOTE   Patient Name: Robin Farley MRN: [id number] DOB:03/11/1966, 57 y.o., female Today's Date: 01/04/2023  PCP: Corwin Levins, MD REFERRING PROVIDER: Manus Rudd, MD   PT End of Session - 01/04/23 1535     Visit Number 8   # unchanged due to screen only   PT Start Time 1532    PT Stop Time 1537    PT Time Calculation (min) 5 min    Activity Tolerance Patient tolerated treatment well    Behavior During Therapy [facility] for tasks assessed/performed             Past Medical History:  Diagnosis Date   Abdominal pain, generalized 12/27/2008   Acute sinusitis, unspecified 02/26/2007   ADD 03/13/2010   ALLERGIC RHINITIS 02/23/2007   ANEMIA 02/05/2007   Anxiety 11/17/2011   ATTENTION DEFICIT DISORDER, HX OF 02/05/2007   Breast cancer (HCC)    Deviated nasal septum 02/05/2007   Family history of breast cancer 11/20/2020   Family history of melanoma 11/20/2020   FATIGUE 02/23/2007   HYPERLIPIDEMIA 02/23/2007   Personal history of chemotherapy    Personal history of malignant melanoma 11/20/2020   Personal history of radiation therapy    Port-A-Cath in place 12/03/2020   SWELLING MASS OR LUMP IN HEAD AND NECK 07/27/2008   UTI 02/23/2007   Past Surgical History:  Procedure Laterality Date   BREAST IMPLANT REMOVAL Right 07/27/2022   Procedure: Removal of right breast implant, washout, drain placemen;  Surgeon: Santiago Glad, MD;  Location: [facility];  Service: Plastics;  Laterality: Right;   BREAST RECONSTRUCTION WITH PLACEMENT OF TISSUE EXPANDER AND FLEX HD (ACELLULAR HYDRATED DERMIS) Right 05/08/2021   Procedure: BREAST RECONSTRUCTION WITH PLACEMENT OF TISSUE EXPANDER AND FLEX HD (ACELLULAR HYDRATED DERMIS);  Surgeon: Allena Napoleon, MD;  Location: [facility];  Service: Plastics;  Laterality: Right;   BROW LIFT     CESAREAN SECTION     COSMETIC SURGERY  1995   Nasal   INCISION AND DRAINAGE OF WOUND Right  09/22/2022   Procedure: right breast washout, debridement, drain placement;  Surgeon: Santiago Glad, MD;  Location: [facility];  Service: Plastics;  Laterality: Right;   MASTECTOMY     MASTECTOMY W/ SENTINEL NODE BIOPSY Right 05/08/2021   Procedure: RIGHT MASTECTOMY WITH SENTINEL LYMPH NODE BIOPSY;  Surgeon: Manus Rudd, MD;  Location: [facility];  Service: General;  Laterality: Right;   MELANOMA EXCISION WITH SENTINEL LYMPH NODE BIOPSY Left 10/24/2012   Procedure: MELANOMA wide EXCISION left lateral thigh WITH SENTINEL LYMPH NODE BIOPSY left groin;  Surgeon: Mariella Saa, MD;  Location: [facility];  Service: General;  Laterality: Left;   NASAL SEPTUM SURGERY     PORTACATH PLACEMENT Right 12/02/2020   Procedure: INSERTION PORT-A-CATH;  Surgeon: Manus Rudd, MD;  Location: [facility];  Service: General;  Laterality: Right;   RADIOACTIVE SEED GUIDED AXILLARY SENTINEL LYMPH NODE Right 05/08/2021   Procedure: RADIOACTIVE SEED GUIDED AXILLARY SENTINEL LYMPH NODE DISSECTION;  Surgeon: Manus Rudd, MD;  Location:  SUR[facility]ERY [facility];  Service: General;  Laterality: Right;   REMOVAL OF TISSUE EXPANDER AND PLACEMENT OF IMPLANT Right 05/05/2022   Procedure: REMOVAL OF TISSUE EXPANDER AND PLACEMENT OF IMPLANT;  Surgeon: Santiago Glad, MD;  Location:  SURGERY CENTER;  Service: Plastics;  Laterality: Right;   Patient Active Problem List   Diagnosis Date Noted   Elevated LFTs  12/10/2020   Genetic testing 12/06/2020   Family history of breast cancer 11/20/2020   Personal history of malignant melanoma 11/20/2020   Family history of melanoma 11/20/2020   Malignant neoplasm of upper-outer quadrant of right breast in female, estrogen receptor negative (HCC) 11/12/2020   Vitamin D deficiency 06/02/2019   Hyperglycemia 06/02/2019   Blood pressure elevated without history of HTN 06/02/2019   Headache 12/16/2018    Urinary incontinence, urge 12/14/2017   Calcific bursitis of shoulder 07/10/2015   Nausea alone 04/27/2013   Melanoma of thigh (HCC) 10/06/2012   Anxiety 11/17/2011   Rosacea 07/08/2011   Right shoulder pain 07/08/2011   Knee effusion, left 08/18/2010   Encounter for well adult exam with abnormal findings 08/18/2010   Attention deficit disorder 03/13/2010   GERD 12/27/2008   Hyperlipidemia 02/23/2007   DEPRESSION 02/23/2007   ALLERGIC RHINITIS 02/23/2007   FATIGUE 02/23/2007   ANEMIA 02/05/2007   Deviated nasal septum 02/05/2007    REFERRING DIAG: right breast cancer at risk for lymphedema  THERAPY DIAG: Aftercare following surgery for neoplasm  PERTINENT HISTORY: 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  She had neoadjuvant chemo, and had  a right mastectomy with immediate expander on 05/08/2021. Completed radiation. Had implant placed on 05/05/22.  Had implant removed on 07/27/22 due to infection. On 09/21/12 had another surgery for debridement and drain placement. Drain removed 10/12/22   PRECAUTIONS: right UE Lymphedema risk, None  SUBJECTIVE: Pt returns for her 3 month L-Dex screen.   PAIN:  Are you having pain? No  SOZO SCREENING: Patient was assessed today using the SOZO machine to determine the lymphedema index score. This was compared to her baseline score. It was determined that she is within the recommended range when compared to her baseline and no further action is needed at this time. She will continue SOZO screenings. These are done every 3 months for 2 years post operatively followed by every 6 months for 2 years, and then annually.   L-DEX FLOWSHEETS - 01/04/23 1500       L-DEX LYMPHEDEMA SCREENING   Measurement Type Unilateral    L-DEX MEASUREMENT EXTREMITY Upper Extremity    POSITION  Standing    DOMINANT SIDE Right    At Risk Side Right    BASELINE SCORE (UNILATERAL) 0.9     L-DEX SCORE (UNILATERAL) 1.9    VALUE CHANGE (UNILAT) 1               Hermenia Bers, PTA 01/04/2023, 3:38 PM

## 2023-01-11 ENCOUNTER — Encounter: Payer: Self-pay | Admitting: Internal Medicine

## 2023-01-14 ENCOUNTER — Encounter: Payer: Self-pay | Admitting: Internal Medicine

## 2023-01-14 ENCOUNTER — Ambulatory Visit (INDEPENDENT_AMBULATORY_CARE_PROVIDER_SITE_OTHER): Payer: Managed Care, Other (non HMO) | Admitting: Internal Medicine

## 2023-01-14 VITALS — BP 122/76 | HR 67 | Temp 98.2°F | Ht 66.0 in | Wt 171.0 lb

## 2023-01-14 DIAGNOSIS — R030 Elevated blood-pressure reading, without diagnosis of hypertension: Secondary | ICD-10-CM | POA: Diagnosis not present

## 2023-01-14 DIAGNOSIS — E559 Vitamin D deficiency, unspecified: Secondary | ICD-10-CM | POA: Diagnosis not present

## 2023-01-14 DIAGNOSIS — R739 Hyperglycemia, unspecified: Secondary | ICD-10-CM

## 2023-01-14 DIAGNOSIS — E538 Deficiency of other specified B group vitamins: Secondary | ICD-10-CM

## 2023-01-14 DIAGNOSIS — R9431 Abnormal electrocardiogram [ECG] [EKG]: Secondary | ICD-10-CM

## 2023-01-14 DIAGNOSIS — E7800 Pure hypercholesterolemia, unspecified: Secondary | ICD-10-CM | POA: Diagnosis not present

## 2023-01-14 DIAGNOSIS — M778 Other enthesopathies, not elsewhere classified: Secondary | ICD-10-CM

## 2023-01-14 MED ORDER — AMPHETAMINE-DEXTROAMPHETAMINE 20 MG PO TABS: ORAL_TABLET | ORAL | 0 refills | Status: DC

## 2023-01-14 NOTE — Progress Notes (Signed)
Patient ID: Robin Farley, female   DOB: 02/04/1966, 57 y.o.   MRN: [id number]        Chief Complaint: follow up elevated bp, HLD and hyperglycemia, low vit d, palmar tendonitis       HPI:  Robin Farley is a 57 y.o. female here with c/o right palmar tendon pain and scarring with early reduced ROM of fingers, Pt denies chest pain, increased sob or doe, wheezing, orthopnea, PND, increased LE swelling, palpitations, dizziness or syncope. Willing for Card Ct Score.   Pt denies polydipsia, polyuria, or new focal neuro s/s.    Pt denies fever, wt loss, night sweats, loss of appetite, or other constitutional symptoms         Wt Readings from Last 3 Encounters:  01/14/23 171 lb (77.6 kg)  01/04/23 171 lb 2 oz (77.6 kg)  10/30/22 171 lb 8 oz (77.8 kg)   BP Readings from Last 3 Encounters:  01/14/23 122/76  10/30/22 (!) 124/57  10/28/22 134/83         Past Medical History:  Diagnosis Date   Abdominal pain, generalized 12/27/2008   Acute sinusitis, unspecified 02/26/2007   ADD 03/13/2010   ALLERGIC RHINITIS 02/23/2007   ANEMIA 02/05/2007   Anxiety 11/17/2011   ATTENTION DEFICIT DISORDER, HX OF 02/05/2007   Breast cancer (HCC)    Deviated nasal septum 02/05/2007   Family history of breast cancer 11/20/2020   Family history of melanoma 11/20/2020   FATIGUE 02/23/2007   HYPERLIPIDEMIA 02/23/2007   Personal history of chemotherapy    Personal history of malignant melanoma 11/20/2020   Personal history of radiation therapy    Port-A-Cath in place 12/03/2020   SWELLING MASS OR LUMP IN HEAD AND NECK 07/27/2008   UTI 02/23/2007   Past Surgical History:  Procedure Laterality Date   BREAST IMPLANT REMOVAL Right 07/27/2022   Procedure: Removal of right breast implant, washout, drain placemen;  Surgeon: Santiago Glad, MD;  [facility];  Service: Plastics;  Laterality: Right;   BREAST RECONSTRUCTION WITH PLACEMENT OF TISSUE EXPANDER AND FLEX HD (ACELLULAR HYDRATED DERMIS) Right  05/08/2021   Procedure: BREAST RECONSTRUCTION WITH PLACEMENT OF TISSUE EXPANDER AND FLEX HD (ACELLULAR HYDRATED DERMIS);  Surgeon: Allena Napoleon, MD;  Location: [facility];  Service: Plastics;  Laterality: Right;   BROW LIFT     CESAREAN SECTION     COSMETIC SURGERY  1995   Nasal   INCISION AND DRAINAGE OF WOUND Right 09/22/2022   Procedure: right breast washout, debridement, drain placement;  Surgeon: Santiago Glad, MD;  Location: [facility];  Service: Plastics;  Laterality: Right;   MASTECTOMY     MASTECTOMY W/ SENTINEL NODE BIOPSY Right 05/08/2021   Procedure: RIGHT MASTECTOMY WITH SENTINEL LYMPH NODE BIOPSY;  Surgeon: Manus Rudd, MD;  Location: [facility];  Service: General;  Laterality: Right;   MELANOMA EXCISION WITH SENTINEL LYMPH NODE BIOPSY Left 10/24/2012   Procedure: MELANOMA wide EXCISION left lateral thigh WITH SENTINEL LYMPH NODE BIOPSY left groin;  Surgeon: Mariella Saa, MD;  Location: [facility];  Service: General;  Laterality: Left;   NASAL SEPTUM SURGERY     PORTACATH PLACEMENT Right 12/02/2020   Procedure: INSERTION PORT-A-CATH;  Surgeon: Manus Rudd, MD;  Location: [facility];  Service: General;  Laterality: Right;   RADIOACTIVE SEED GUIDED AXILLARY SENTINEL LYMPH NODE Right 05/08/2021   Procedure: RADIOACTIVE SEED GUIDED AXILLARY SENTINEL LYMPH NODE DISSECTION;  Surgeon:  Manus Rudd, MD;  Location: [facility];  Service: General;  Laterality: Right;   REMOVAL OF TISSUE EXPANDER AND PLACEMENT OF IMPLANT Right 05/05/2022   Procedure: REMOVAL OF TISSUE EXPANDER AND PLACEMENT OF IMPLANT;  Surgeon: Santiago Glad, MD;  Location: [facility];  Service: Plastics;  Laterality: Right;    reports that she has never smoked. She has never used smokeless tobacco. She reports that she does not currently use alcohol. She reports that she does not use  drugs. family history includes Alcohol abuse in an other family member; Atrial fibrillation in her father and sister; Brain cancer (age of onset: 13) in her paternal grandfather; Breast cancer in an other family member; Breast cancer (age of onset: 27) in her mother; COPD in her father; Cancer in her paternal aunt; Depression in an other family member; Glaucoma in an other family member; Hypertension in her mother; Kidney cancer in her maternal aunt; Leukemia in her paternal aunt; Melanoma in her cousin. Allergies  Allergen Reactions   Promethazine Hcl     Legs twitch   Current Outpatient Medications on File Prior to Visit  Medication Sig Dispense Refill   melatonin 5 MG TABS Take 5 mg by mouth at bedtime as needed (sleep).     ondansetron (ZOFRAN) 4 MG tablet Take 1 tablet (4 mg total) by mouth every 8 (eight) hours as needed for up to 20 doses for nausea or vomiting. (Patient not taking: Reported on 10/28/2022) 20 tablet 0   ondansetron (ZOFRAN-ODT) 4 MG disintegrating tablet Take 1 tablet (4 mg total) by mouth every 8 (eight) hours as needed for nausea or vomiting. (Patient not taking: Reported on 10/28/2022) 20 tablet 0   oxyCODONE (ROXICODONE) 5 MG immediate release tablet Take 1 tablet (5 mg total) by mouth every 6 (six) hours as needed for up to 20 doses for severe pain. (Patient not taking: Reported on 10/28/2022) 20 tablet 0   TURMERIC CURCUMIN PO Take 750 mg by mouth daily. (Patient not taking: Reported on 01/14/2023)     No current facility-administered medications on file prior to visit.        ROS:  All others reviewed and negative.  Objective        PE:  BP 122/76 (BP Location: Left Arm, Patient Position: Sitting, Cuff Size: Normal)   Pulse 67   Temp 98.2 F (36.8 C) (Oral)   Ht 5\' 6"  (1.676 m)   Wt 171 lb (77.6 kg)   LMP 04/27/2016   SpO2 98%   BMI 27.60 kg/m                 Constitutional: Pt appears in NAD               HENT: Head: NCAT.                Right Ear:  External ear normal.                 Left Ear: External ear normal.                Eyes: . Pupils are equal, round, and reactive to light. Conjunctivae and EOM are normal               Nose: without d/c or deformity               Neck: Neck supple. Gross normal ROM  Cardiovascular: Normal rate and regular rhythm.                 Pulmonary/Chest: Effort normal and breath sounds without rales or wheezing.                Right palm with multiple tendon scarring tenderness and early dupytrens               Neurological: Pt is alert. At baseline orientation, motor grossly intact               Skin: Skin is warm. No rashes, no other new lesions, LE edema - none               Psychiatric: Pt behavior is normal without agitation   Micro: none  Cardiac tracings I have personally interpreted today:  none  Pertinent Radiological findings (summarize): none   Lab Results  Component Value Date   WBC 5.6 08/11/2022   HGB 12.7 08/11/2022   HCT 37.5 08/11/2022   PLT 242.0 08/11/2022   GLUCOSE 84 08/11/2022   CHOL 290 (H) 08/11/2022   TRIG 172.0 (H) 08/11/2022   HDL 61.50 08/11/2022   LDLDIRECT 159.0 08/21/2014   LDLCALC 194 (H) 08/11/2022   ALT 23 08/11/2022   AST 22 08/11/2022   NA 140 08/11/2022   K 4.6 08/11/2022   CL 104 08/11/2022   CREATININE 0.82 08/11/2022   BUN 16 08/11/2022   CO2 28 08/11/2022   TSH 2.42 08/11/2022   HGBA1C 5.4 08/11/2022   Assessment/Plan:  Robin Farley is a 57 y.o. White or Caucasian [1] female with  has a past medical history of Abdominal pain, generalized (12/27/2008), Acute sinusitis, unspecified (02/26/2007), ADD (03/13/2010), ALLERGIC RHINITIS (02/23/2007), ANEMIA (02/05/2007), Anxiety (11/17/2011), ATTENTION DEFICIT DISORDER, HX OF (02/05/2007), Breast cancer (HCC), Deviated nasal septum (02/05/2007), Family history of breast cancer (11/20/2020), Family history of melanoma (11/20/2020), FATIGUE (02/23/2007), HYPERLIPIDEMIA (02/23/2007),  Personal history of chemotherapy, Personal history of malignant melanoma (11/20/2020), Personal history of radiation therapy, Port-A-Cath in place (12/03/2020), SWELLING MASS OR LUMP IN HEAD AND NECK (07/27/2008), and UTI (02/23/2007).  Hyperlipidemia Lab Results  Component Value Date   LDLCALC 194 (H) 08/11/2022   Severe uncontrolled, declines statin or other tx, for CT cardiac score   Vitamin D deficiency Last vitamin D Lab Results  Component Value Date   VD25OH 25.99 (L) 08/11/2022   Low, to start oral replacement   Hyperglycemia Lab Results  Component Value Date   HGBA1C 5.4 08/11/2022   Stable, pt to continue current medical treatment  - diet, wt control   Blood pressure elevated without history of HTN BP Readings from Last 3 Encounters:  01/14/23 122/76  10/30/22 (!) 124/57  10/28/22 134/83   Stable, pt to continue medical treatment  - diet, wt control   Right hand tendonitis Mild to mod, for volt gel prn, and refer hand surgury,  to f/u any worsening symptoms or concerns Followup: Return in about 6 months (around 07/15/2023).  Oliver Barre, MD 01/17/2023 6:24 PM [facility]

## 2023-01-14 NOTE — Patient Instructions (Signed)
Please continue all other medications as before, including the adderall today  Please have the pharmacy call with any other refills you may need.  Please continue your efforts at being more active, low cholesterol diet, and weight control.  Please keep your appointments with your specialists as you may have planned  You will be contacted regarding the referral for: Cardiac CT score, and Hand Surgury  Please make an Appointment to return in 6 months, or sooner if needed, also with Lab Appointment for testing done 3-5 days before at the FIRST FLOOR Lab (so this is for TWO appointments - please see the scheduling desk as you leave)

## 2023-01-17 ENCOUNTER — Encounter: Payer: Self-pay | Admitting: Internal Medicine

## 2023-01-17 DIAGNOSIS — M778 Other enthesopathies, not elsewhere classified: Secondary | ICD-10-CM | POA: Insufficient documentation

## 2023-01-17 NOTE — Assessment & Plan Note (Signed)
Last vitamin D Lab Results  Component Value Date   VD25OH 25.99 (L) 08/11/2022   Low, to start oral replacement

## 2023-01-17 NOTE — Assessment & Plan Note (Signed)
Mild to mod, for volt gel prn, and refer hand surgury,  to f/u any worsening symptoms or concerns

## 2023-01-17 NOTE — Assessment & Plan Note (Signed)
Lab Results  Component Value Date   HGBA1C 5.4 08/11/2022   Stable, pt to continue current medical treatment  - diet, wt control

## 2023-01-17 NOTE — Assessment & Plan Note (Signed)
BP Readings from Last 3 Encounters:  01/14/23 122/76  10/30/22 (!) 124/57  10/28/22 134/83   Stable, pt to continue medical treatment  - diet, wt control

## 2023-01-17 NOTE — Assessment & Plan Note (Signed)
Lab Results  Component Value Date   LDLCALC 194 (H) 08/11/2022   Severe uncontrolled, declines statin or other tx, for CT cardiac score

## 2023-01-17 NOTE — Addendum Note (Signed)
Addended by: Corwin Levins on: 01/17/2023 06:25 PM   Modules accepted: Orders

## 2023-01-20 ENCOUNTER — Ambulatory Visit (HOSPITAL_COMMUNITY)
Admission: RE | Admit: 2023-01-20 | Discharge: 2023-01-20 | Disposition: A | Discharge status: Home / Self Care | Payer: Managed Care, Other (non HMO) | Source: Ambulatory Visit | Attending: Internal Medicine | Admitting: Internal Medicine

## 2023-01-20 DIAGNOSIS — R9431 Abnormal electrocardiogram [ECG] [EKG]: Secondary | ICD-10-CM

## 2023-01-20 DIAGNOSIS — R739 Hyperglycemia, unspecified: Secondary | ICD-10-CM

## 2023-01-20 DIAGNOSIS — E7800 Pure hypercholesterolemia, unspecified: Secondary | ICD-10-CM

## 2023-02-01 ENCOUNTER — Telehealth: Payer: Self-pay

## 2023-02-01 NOTE — Telephone Encounter (Signed)
Unable to reach patient and left message stating that she needed to call back by 5pm today and reschedule the pre visit with the nurse or her colonoscopy would be cancelled

## 2023-02-01 NOTE — Telephone Encounter (Signed)
No answer for pre visit.  Left message that I was trying to reach her

## 2023-02-11 ENCOUNTER — Ambulatory Visit: Payer: Managed Care, Other (non HMO) | Admitting: *Deleted

## 2023-02-11 VITALS — Ht 66.0 in | Wt 165.0 lb

## 2023-02-11 DIAGNOSIS — Z85038 Personal history of other malignant neoplasm of large intestine: Secondary | ICD-10-CM

## 2023-02-11 MED ORDER — NA SULFATE-K SULFATE-MG SULF 17.5-3.13-1.6 GM/177ML PO SOLN
1.0000 | Freq: Once | ORAL | 0 refills | Status: AC
Start: 2023-02-11 — End: 2023-02-11

## 2023-02-11 NOTE — Progress Notes (Signed)
Pt's name and DOB verified at the beginning of the pre-visit wit 2 identifiers  Pt denies any difficulty with ambulating,sitting, laying down or rolling side to side  Gave both [facility] main # and MD on call # prior to instructions.   No egg or soy allergy known to patient   No issues known to pt with past sedation with any surgeries or procedures  Pt denies having issues being intubate  Pt has no issues moving head neck or swallowing  No FH of Malignant Hyperthermia  Pt is not on diet pills or shots  Pt is not on home 02   Pt is not on blood thinners   Pt denies issues with constipation   Pt is not on dialysis165 lb  Pt denise any abnormal heart rhythms   Pt denies any upcoming cardiac testing  Pt encouraged to use to use Singlecare or Goodrx to reduce cost   Patient's chart reviewed by Robin Farley CNRA prior to pre-visit and patient appropriate for the [facility].  Pre-visit completed and red dot placed by patient's name on their procedure day (on provider's schedule).  .  Visit by phone  Pt states weight is   Instructed pt why it is important to and  to call if they have any changes in health or new medications. Directed them to the # given and on instructions.     Instructions reviewed with pt and pt states understanding. Instructed to review again prior to procedure. Pt states they will.   Instructions sent by mail with coupon and by my chart

## 2023-02-22 ENCOUNTER — Encounter: Payer: Self-pay | Admitting: Internal Medicine

## 2023-02-24 ENCOUNTER — Encounter: Payer: Self-pay | Admitting: Internal Medicine

## 2023-02-24 ENCOUNTER — Ambulatory Visit: Payer: Managed Care, Other (non HMO) | Admitting: Internal Medicine

## 2023-02-24 VITALS — BP 132/80 | HR 81 | Temp 98.0°F | Ht 66.0 in | Wt 171.0 lb

## 2023-02-24 DIAGNOSIS — E559 Vitamin D deficiency, unspecified: Secondary | ICD-10-CM | POA: Diagnosis not present

## 2023-02-24 DIAGNOSIS — M545 Low back pain, unspecified: Secondary | ICD-10-CM | POA: Diagnosis not present

## 2023-02-24 DIAGNOSIS — R739 Hyperglycemia, unspecified: Secondary | ICD-10-CM | POA: Diagnosis not present

## 2023-02-24 DIAGNOSIS — E7800 Pure hypercholesterolemia, unspecified: Secondary | ICD-10-CM

## 2023-02-24 LAB — URINALYSIS, ROUTINE W REFLEX MICROSCOPIC
Bilirubin Urine: NEGATIVE
Ketones, ur: NEGATIVE
Leukocytes,Ua: NEGATIVE
Nitrite: NEGATIVE
Specific Gravity, Urine: 1.025 (ref 1.000–1.030)
Total Protein, Urine: NEGATIVE
Urine Glucose: NEGATIVE
Urobilinogen, UA: 0.2 (ref 0.0–1.0)
pH: 6 (ref 5.0–8.0)

## 2023-02-24 MED ORDER — CYCLOBENZAPRINE HCL 5 MG PO TABS: 5.0000 mg | ORAL_TABLET | Freq: Three times a day (TID) | ORAL | 1 refills | Status: AC | PRN

## 2023-02-24 MED ORDER — MELOXICAM 15 MG PO TABS: 15.0000 mg | ORAL_TABLET | Freq: Every day | ORAL | 1 refills | Status: DC | PRN

## 2023-02-24 NOTE — Progress Notes (Unsigned)
Patient ID: Robin Farley, female   DOB: October 12, 1965, 57 y.o.   MRN: [id number]        Chief Complaint: follow up left lower back pain       HPI:  Robin Farley is a 57 y.o. female here with c/o acute onset left lower back pain tightness, mild to mod, constant x 3 days, worse to stand up or bend, but no LE symptoms.  Denies urinary symptoms such as dysuria, frequency, urgency, flank pain, hematuria or n/v, fever, chills, but wondering about UTI.  Pt denies chest pain, increased sob or doe, wheezing, orthopnea, PND, increased LE swelling, palpitations, dizziness or syncope.   Pt denies polydipsia, polyuria, or new focal neuro s/s.    Pt denies fever, wt loss, night sweats, loss of appetite, or other constitutional symptoms         Wt Readings from Last 3 Encounters:  02/24/23 171 lb (77.6 kg)  02/11/23 165 lb (74.8 kg)  01/14/23 171 lb (77.6 kg)   BP Readings from Last 3 Encounters:  02/24/23 132/80  01/14/23 122/76  10/30/22 (!) 124/57         Past Medical History:  Diagnosis Date   Abdominal pain, generalized 12/27/2008   Acute sinusitis, unspecified 02/26/2007   ADD 03/13/2010   ALLERGIC RHINITIS 02/23/2007   ANEMIA 02/05/2007   Anxiety 11/17/2011   ATTENTION DEFICIT DISORDER, HX OF 02/05/2007   Breast cancer (HCC)    Deviated nasal septum 02/05/2007   Family history of breast cancer 11/20/2020   Family history of melanoma 11/20/2020   FATIGUE 02/23/2007   GERD (gastroesophageal reflux disease)    HYPERLIPIDEMIA 02/23/2007   Personal history of chemotherapy    Personal history of malignant melanoma 11/20/2020   Personal history of radiation therapy    Port-A-Cath in place 12/03/2020   SWELLING MASS OR LUMP IN HEAD AND NECK 07/27/2008   UTI 02/23/2007   Past Surgical History:  Procedure Laterality Date   BREAST IMPLANT REMOVAL Right 07/27/2022   Procedure: Removal of right breast implant, washout, drain placemen;  Surgeon: Santiago Glad, MD;  Location: [facility];   Service: Plastics;  Laterality: Right;   BREAST RECONSTRUCTION WITH PLACEMENT OF TISSUE EXPANDER AND FLEX HD (ACELLULAR HYDRATED DERMIS) Right 05/08/2021   Procedure: BREAST RECONSTRUCTION WITH PLACEMENT OF TISSUE EXPANDER AND FLEX HD (ACELLULAR HYDRATED DERMIS);  Surgeon: Allena Napoleon, MD;  Location: [facility];  Service: Plastics;  Laterality: Right;   BROW LIFT     CESAREAN SECTION     COSMETIC SURGERY  1995   Nasal   INCISION AND DRAINAGE OF WOUND Right 09/22/2022   Procedure: right breast washout, debridement, drain placement;  Surgeon: Santiago Glad, MD;  Location: [facility];  Service: Plastics;  Laterality: Right;   MASTECTOMY     MASTECTOMY W/ SENTINEL NODE BIOPSY Right 05/08/2021   Procedure: RIGHT MASTECTOMY WITH SENTINEL LYMPH NODE BIOPSY;  Surgeon: Manus Rudd, MD;  Location: [facility];  Service: General;  Laterality: Right;   MELANOMA EXCISION WITH SENTINEL LYMPH NODE BIOPSY Left 10/24/2012   Procedure: MELANOMA wide EXCISION left lateral thigh WITH SENTINEL LYMPH NODE BIOPSY left groin;  Surgeon: Mariella Saa, MD;  Location: [facility];  Service: General;  Laterality: Left;   NASAL SEPTUM SURGERY     PORTACATH PLACEMENT Right 12/02/2020   Procedure: INSERTION PORT-A-CATH;  Surgeon: Manus Rudd, MD;  Location:  [facility];  Service: General;  Laterality:  Right;   RADIOACTIVE SEED GUIDED AXILLARY SENTINEL LYMPH NODE Right 05/08/2021   Procedure: RADIOACTIVE SEED GUIDED AXILLARY SENTINEL LYMPH NODE DISSECTION;  Surgeon: Manus Rudd, MD;  Location: [facility];  Service: General;  Laterality: Right;   REMOVAL OF TISSUE EXPANDER AND PLACEMENT OF IMPLANT Right 05/05/2022   Procedure: REMOVAL OF TISSUE EXPANDER AND PLACEMENT OF IMPLANT;  Surgeon: Santiago Glad, MD;  Location:  SUR[facility]ERY [facility];  Service: Plastics;  Laterality: Right;    reports that she has never  smoked. She has never used smokeless tobacco. She reports that she does not currently use alcohol. She reports that she does not use drugs. family history includes Alcohol abuse in an other family member; Atrial fibrillation in her father and sister; Brain cancer (age of onset: 65) in her paternal grandfather; Breast cancer in an other family member; Breast cancer (age of onset: 61) in her mother; COPD in her father; Cancer in her paternal aunt; Depression in an other family member; Glaucoma in an other family member; Hypertension in her mother; Kidney cancer in her maternal aunt; Leukemia in her paternal aunt; Melanoma in her cousin. Allergies  Allergen Reactions   Promethazine Hcl     Legs twitch   Current Outpatient Medications on File Prior to Visit  Medication Sig Dispense Refill   amphetamine-dextroamphetamine (ADDERALL) 20 MG tablet Take 1.5 tabs by mouth twice per day 90 tablet 0   Berberine Chloride (BERBERINE HCI PO) Take by mouth.     melatonin 5 MG TABS Take 5 mg by mouth at bedtime as needed (sleep).     OVER THE COUNTER MEDICATION Bosmed QD     OVER THE COUNTER MEDICATION ANDEROGRAPHIS QD     TURMERIC CURCUMIN PO Take 750 mg by mouth daily.     No current facility-administered medications on file prior to visit.        ROS:  All others reviewed and negative.  Objective        PE:  BP 132/80   Pulse 81   Temp 98 F (36.7 C) (Oral)   Ht 5\' 6"  (1.676 m)   Wt 171 lb (77.6 kg)   LMP 04/27/2016   SpO2 98%   BMI 27.60 kg/m                 Constitutional: Pt appears in NAD               HENT: Head: NCAT.                Right Ear: External ear normal.                 Left Ear: External ear normal.                Eyes: . Pupils are equal, round, and reactive to light. Conjunctivae and EOM are normal               Nose: without d/c or deformity               Neck: Neck supple. Gross normal ROM               Cardiovascular: Normal rate and regular rhythm.                  Pulmonary/Chest: Effort normal and breath sounds without rales or wheezing.                Abd:  Soft, NT, ND, +  BS, no organomegaly               Neurological: Pt is alert. At baseline orientation, motor grossly intact               Skin: Skin is warm. No rashes, no other new lesions, LE edema - none; left lumbar muscular spasm tender               Psychiatric: Pt behavior is normal without agitation   Micro: none  Cardiac tracings I have personally interpreted today:  none  Pertinent Radiological findings (summarize): none   Lab Results  Component Value Date   WBC 5.6 08/11/2022   HGB 12.7 08/11/2022   HCT 37.5 08/11/2022   PLT 242.0 08/11/2022   GLUCOSE 84 08/11/2022   CHOL 290 (H) 08/11/2022   TRIG 172.0 (H) 08/11/2022   HDL 61.50 08/11/2022   LDLDIRECT 159.0 08/21/2014   LDLCALC 194 (H) 08/11/2022   ALT 23 08/11/2022   AST 22 08/11/2022   NA 140 08/11/2022   K 4.6 08/11/2022   CL 104 08/11/2022   CREATININE 0.82 08/11/2022   BUN 16 08/11/2022   CO2 28 08/11/2022   TSH 2.42 08/11/2022   HGBA1C 5.4 08/11/2022   Assessment/Plan:  Robin Farley is a 57 y.o. White or Caucasian [1] female with  has a past medical history of Abdominal pain, generalized (12/27/2008), Acute sinusitis, unspecified (02/26/2007), ADD (03/13/2010), ALLERGIC RHINITIS (02/23/2007), ANEMIA (02/05/2007), Anxiety (11/17/2011), ATTENTION DEFICIT DISORDER, HX OF (02/05/2007), Breast cancer (HCC), Deviated nasal septum (02/05/2007), Family history of breast cancer (11/20/2020), Family history of melanoma (11/20/2020), FATIGUE (02/23/2007), GERD (gastroesophageal reflux disease), HYPERLIPIDEMIA (02/23/2007), Personal history of chemotherapy, Personal history of malignant melanoma (11/20/2020), Personal history of radiation therapy, Port-A-Cath in place (12/03/2020), SWELLING MASS OR LUMP IN HEAD AND NECK (07/27/2008), and UTI (02/23/2007).  Vitamin D deficiency Last vitamin D Lab Results  Component Value  Date   VD25OH 25.99 (L) 08/11/2022   Low, to start oral replacement   Low back pain Mild to mod, cw msk strain, for ua but ok for nsaid prn,  to f/u any worsening symptoms or concerns  Hyperlipidemia Lab Results  Component Value Date   LDLCALC 194 (H) 08/11/2022   Severe but recent card CT score is zero; declines statin, for low chol diet   Hyperglycemia Lab Results  Component Value Date   HGBA1C 5.4 08/11/2022   Stable, pt to continue current medical treatment  - diet, wt control  Followup: Return if symptoms worsen or fail to improve.  Oliver Barre, MD 02/25/2023 9:39 PM [facility]

## 2023-02-24 NOTE — Patient Instructions (Signed)
Please take all new medication as prescribed  - the anti-inflammatory for pain, and the muscle relaxer as needed  Please continue all other medications as before, and refills have been done if requested.  Please have the pharmacy call with any other refills you may need.  Please keep your appointments with your specialists as you may have planned  Please go to the LAB at the blood drawing area for the tests to be done - just the urine testing today  You will be contacted by phone if any changes need to be made immediately.  Otherwise, you will receive a letter about your results with an explanation, but please check with MyChart first.

## 2023-02-25 ENCOUNTER — Encounter: Payer: Self-pay | Admitting: Internal Medicine

## 2023-02-25 NOTE — Assessment & Plan Note (Signed)
Mild to mod, cw msk strain, for ua but ok for nsaid prn,  to f/u any worsening symptoms or concerns

## 2023-02-25 NOTE — Assessment & Plan Note (Signed)
Lab Results  Component Value Date   HGBA1C 5.4 08/11/2022   Stable, pt to continue current medical treatment  - diet, wt control

## 2023-02-25 NOTE — Assessment & Plan Note (Signed)
Lab Results  Component Value Date   LDLCALC 194 (H) 08/11/2022   Severe but recent card CT score is zero; declines statin, for low chol diet

## 2023-02-25 NOTE — Assessment & Plan Note (Signed)
Last vitamin D Lab Results  Component Value Date   VD25OH 25.99 (L) 08/11/2022   Low, to start oral replacement

## 2023-03-02 ENCOUNTER — Ambulatory Visit: Payer: Managed Care, Other (non HMO) | Admitting: Internal Medicine

## 2023-03-02 ENCOUNTER — Encounter: Payer: Self-pay | Admitting: Internal Medicine

## 2023-03-02 VITALS — BP 134/75 | HR 69 | Temp 97.8°F | Resp 20 | Ht 66.0 in | Wt 165.0 lb

## 2023-03-02 DIAGNOSIS — K635 Polyp of colon: Secondary | ICD-10-CM | POA: Diagnosis not present

## 2023-03-02 DIAGNOSIS — Z09 Encounter for follow-up examination after completed treatment for conditions other than malignant neoplasm: Secondary | ICD-10-CM

## 2023-03-02 DIAGNOSIS — Z860100 Personal history of colon polyps, unspecified: Secondary | ICD-10-CM | POA: Diagnosis not present

## 2023-03-02 DIAGNOSIS — D123 Benign neoplasm of transverse colon: Secondary | ICD-10-CM

## 2023-03-02 MED ORDER — SODIUM CHLORIDE 0.9 % IV SOLN: 500.0000 mL | Freq: Once | INTRAVENOUS | Status: DC

## 2023-03-02 NOTE — Progress Notes (Signed)
Called to room to assist during endoscopic procedure.  Patient ID and intended procedure confirmed with present staff. Received instructions for my participation in the procedure from the performing physician.  

## 2023-03-02 NOTE — Progress Notes (Signed)
HISTORY OF PRESENT ILLNESS:  Robin Farley is a 57 y.o. female with history of adenomatous colon polyp on colonoscopy September 2016.  Presents for surveillance colonoscopy.  No complaints  REVIEW OF SYSTEMS:  All non-GI ROS negative except for  Past Medical History:  Diagnosis Date   Abdominal pain, generalized 12/27/2008   Acute sinusitis, unspecified 02/26/2007   ADD 03/13/2010   ALLERGIC RHINITIS 02/23/2007   ANEMIA 02/05/2007   Anxiety 11/17/2011   ATTENTION DEFICIT DISORDER, HX OF 02/05/2007   Breast cancer (HCC)    Deviated nasal septum 02/05/2007   Family history of breast cancer 11/20/2020   Family history of melanoma 11/20/2020   FATIGUE 02/23/2007   GERD (gastroesophageal reflux disease)    HYPERLIPIDEMIA 02/23/2007   Personal history of chemotherapy    Personal history of malignant melanoma 11/20/2020   Personal history of radiation therapy    Port-A-Cath in place 12/03/2020   SWELLING MASS OR LUMP IN HEAD AND NECK 07/27/2008   UTI 02/23/2007    Past Surgical History:  Procedure Laterality Date   BREAST IMPLANT REMOVAL Right 07/27/2022   Procedure: Removal of right breast implant, washout, drain placemen;  Surgeon: Santiago Glad, MD;  Location: [facility];  Service: Plastics;  Laterality: Right;   BREAST RECONSTRUCTION WITH PLACEMENT OF TISSUE EXPANDER AND FLEX HD (ACELLULAR HYDRATED DERMIS) Right 05/08/2021   Procedure: BREAST RECONSTRUCTION WITH PLACEMENT OF TISSUE EXPANDER AND FLEX HD (ACELLULAR HYDRATED DERMIS);  Surgeon: Allena Napoleon, MD;  Location: [facility];  Service: Plastics;  Laterality: Right;   BROW LIFT     CESAREAN SECTION     COSMETIC SURGERY  1995   Nasal   INCISION AND DRAINAGE OF WOUND Right 09/22/2022   Procedure: right breast washout, debridement, drain placement;  Surgeon: Santiago Glad, MD;  Location: [facility];  Service: Plastics;  Laterality: Right;   MASTECTOMY     MASTECTOMY W/ SENTINEL NODE  BIOPSY Right 05/08/2021   Procedure: RIGHT MASTECTOMY WITH SENTINEL LYMPH NODE BIOPSY;  Surgeon: Manus Rudd, MD;  Location: [facility];  Service: General;  Laterality: Right;   MELANOMA EXCISION WITH SENTINEL LYMPH NODE BIOPSY Left 10/24/2012   Procedure: MELANOMA wide EXCISION left lateral thigh WITH SENTINEL LYMPH NODE BIOPSY left groin;  Surgeon: Mariella Saa, MD;  Location: [facility];  Service: General;  Laterality: Left;   NASAL SEPTUM SURGERY     PORTACATH PLACEMENT Right 12/02/2020   Procedure: INSERTION PORT-A-CATH;  Surgeon: Manus Rudd, MD;  Location: [facility];  Service: General;  Laterality: Right;   RADIOACTIVE SEED GUIDED AXILLARY SENTINEL LYMPH NODE Right 05/08/2021   Procedure: RADIOACTIVE SEED GUIDED AXILLARY SENTINEL LYMPH NODE DISSECTION;  Surgeon: Manus Rudd, MD;  Location: [facility];  Service: General;  Laterality: Right;   REMOVAL OF TISSUE EXPANDER AND PLACEMENT OF IMPLANT Right 05/05/2022   Procedure: REMOVAL OF TISSUE EXPANDER AND PLACEMENT OF IMPLANT;  Surgeon: Santiago Glad, MD;  Location: [facility];  Service: Plastics;  Laterality: Right;    Social History Robin Farley  reports that she has never smoked. She has never used smokeless tobacco. She reports that she does not currently use alcohol. She reports that she does not use drugs.  family history includes Alcohol abuse in an other family member; Atrial fibrillation in her father and sister; Brain cancer (age of onset: 71) in her paternal grandfather; Breast cancer in an other family member; Breast  cancer (age of onset: 58) in her mother; COPD in her father; Cancer in her paternal aunt; Depression in an other family member; Glaucoma in an other family member; Hypertension in her mother; Kidney cancer in her maternal aunt; Leukemia in her paternal aunt; Melanoma in her cousin.  Allergies  Allergen Reactions    Promethazine Hcl     Legs twitch       PHYSICAL EXAMINATION: Vital signs: BP 135/84   Pulse 87   Temp 97.8 F (36.6 C)   Ht 5\' 6"  (1.676 m)   Wt 165 lb (74.8 kg)   LMP 04/27/2016   SpO2 96%   BMI 26.63 kg/m  General: Well-developed, well-nourished, no acute distress HEENT: Sclerae are anicteric, conjunctiva pink. Oral mucosa intact Lungs: Clear Heart: Regular Abdomen: soft, nontender, nondistended, no obvious ascites, no peritoneal signs, normal bowel sounds. No organomegaly. Extremities: No edema Psychiatric: alert and oriented x3. Cooperative     ASSESSMENT:  Personal history of tubular adenoma   PLAN:  Surveillance colonoscopy

## 2023-03-02 NOTE — Op Note (Signed)
[facility] Patient Name: Robin Farley Procedure Date: 03/02/2023 9:51 AM MRN: [id number] Endoscopist: Wilhemina Bonito. Marina Goodell , MD, [id number] Age: 57 Referring MD:  Date of Birth: [birth year] Gender: Female Account #: [id number] Procedure:                Colonoscopy with cold snare polypectomy x 1 Indications:              High risk colon cancer surveillance: Personal                            history of non-advanced adenoma. Previous                            examination September 2016 Medicines:                Monitored Anesthesia Care Procedure:                Pre-Anesthesia Assessment:                           - Prior to the procedure, a History and Physical                            was performed, and patient medications and                            allergies were reviewed. The patient's tolerance of                            previous anesthesia was also reviewed. The risks                            and benefits of the procedure and the sedation                            options and risks were discussed with the patient.                            All questions were answered, and informed consent                            was obtained. Prior Anticoagulants: The patient has                            taken no anticoagulant or antiplatelet agents. ASA                            Grade Assessment: II - A patient with mild systemic                            disease. After reviewing the risks and benefits,                            the patient was deemed in satisfactory condition to  undergo the procedure.                           After obtaining informed consent, the colonoscope                            was passed under direct vision. Throughout the                            procedure, the patient's blood pressure, pulse, and                            oxygen saturations were monitored continuously. The                            CF [id number]  #[id number] was introduced through the anus                            and advanced to the the cecum, identified by                            appendiceal orifice and ileocecal valve. The                            ileocecal valve, appendiceal orifice, and rectum                            were photographed. The quality of the bowel                            preparation was excellent. The colonoscopy was                            performed without difficulty. The patient tolerated                            the procedure well. The bowel preparation used was                            SUPREP via split dose instruction. Scope In: 10:01:55 AM Scope Out: 10:13:31 AM Scope Withdrawal Time: 0 hours 9 minutes 27 seconds  Total Procedure Duration: 0 hours 11 minutes 36 seconds  Findings:                 A 3 mm polyp was found in the transverse colon. The                            polyp was removed with a cold snare. Resection and                            retrieval were complete.                           Many diverticula were found in the entire colon.  Internal hemorrhoids were found during                            retroflexion. The hemorrhoids were moderate.                           The exam was otherwise without abnormality on                            direct and retroflexion views. Complications:            No immediate complications. Estimated blood loss:                            None. Estimated Blood Loss:     Estimated blood loss: none. Impression:               - One 3 mm polyp in the transverse colon, removed                            with a cold snare. Resected and retrieved.                           - Diverticulosis in the entire examined colon.                           - Internal hemorrhoids.                           - The examination was otherwise normal on direct                            and retroflexion views. Recommendation:           -  Repeat colonoscopy in 7-10 years for surveillance.                           - Patient has a contact number available for                            emergencies. The signs and symptoms of potential                            delayed complications were discussed with the                            patient. Return to normal activities tomorrow.                            Written discharge instructions were provided to the                            patient.                           - Resume previous diet.                           -  Continue present medications.                           - Await pathology results. Wilhemina Bonito. Marina Goodell, MD 03/02/2023 10:18:14 AM This report has been signed electronically.

## 2023-03-02 NOTE — Patient Instructions (Signed)

## 2023-03-03 ENCOUNTER — Telehealth: Payer: Self-pay | Admitting: *Deleted

## 2023-03-03 NOTE — Telephone Encounter (Signed)
Post procedure follow up call placed, no answer and left VM.  

## 2023-03-05 LAB — SURGICAL PATHOLOGY

## 2023-03-08 ENCOUNTER — Encounter: Payer: Self-pay | Admitting: Internal Medicine

## 2023-04-05 ENCOUNTER — Ambulatory Visit: Payer: Managed Care, Other (non HMO)

## 2023-04-26 ENCOUNTER — Ambulatory Visit: Payer: Managed Care, Other (non HMO) | Attending: Student

## 2023-04-26 VITALS — Wt 172.0 lb

## 2023-04-26 DIAGNOSIS — Z483 Aftercare following surgery for neoplasm: Secondary | ICD-10-CM | POA: Insufficient documentation

## 2023-04-26 NOTE — Therapy (Signed)
 OUTPATIENT PHYSICAL THERAPY SOZO SCREENING NOTE   Patient Name: Robin Farley MRN: [id number] DOB:1966/01/22, 58 y.o., female Today's Date: 04/26/2023  PCP: Norleen Lynwood ORN, MD REFERRING PROVIDER: Andris Estefana BRAVO, PA-C   PT End of Session - 04/26/23 [phone]     Visit Number 8   # unchanged due to screen only   PT Start Time 0942    PT Stop Time 0947    PT Time Calculation (min) 5 min    Activity Tolerance Patient tolerated treatment well    Behavior During Therapy [facility] for tasks assessed/performed             Past Medical History:  Diagnosis Date   Abdominal pain, generalized 12/27/2008   Acute sinusitis, unspecified 02/26/2007   ADD 03/13/2010   ALLERGIC RHINITIS 02/23/2007   ANEMIA 02/05/2007   Anxiety 11/17/2011   ATTENTION DEFICIT DISORDER, HX OF 02/05/2007   Breast cancer (HCC)    Deviated nasal septum 02/05/2007   Family history of breast cancer 11/20/2020   Family history of melanoma 11/20/2020   FATIGUE 02/23/2007   GERD (gastroesophageal reflux disease)    HYPERLIPIDEMIA 02/23/2007   Personal history of chemotherapy    Personal history of malignant melanoma 11/20/2020   Personal history of radiation therapy    Port-A-Cath in place 12/03/2020   SWELLING MASS OR LUMP IN HEAD AND NECK 07/27/2008   UTI 02/23/2007   Past Surgical History:  Procedure Laterality Date   BREAST IMPLANT REMOVAL Right 07/27/2022   Procedure: Removal of right breast implant, washout, drain placemen;  Surgeon: Waddell Leonce NOVAK, MD;  Location: [facility];  Service: Plastics;  Laterality: Right;   BREAST RECONSTRUCTION WITH PLACEMENT OF TISSUE EXPANDER AND FLEX HD (ACELLULAR HYDRATED DERMIS) Right 05/08/2021   Procedure: BREAST RECONSTRUCTION WITH PLACEMENT OF TISSUE EXPANDER AND FLEX HD (ACELLULAR HYDRATED DERMIS);  Surgeon: Elisabeth Craig RAMAN, MD;  Location: [facility];  Service: Plastics;  Laterality: Right;   BROW LIFT     CESAREAN SECTION     COLONOSCOPY  2016   [facility]an TA  5 yr recall   COSMETIC SURGERY  1995   Nasal   INCISION AND DRAINAGE OF WOUND Right 09/22/2022   Procedure: right breast washout, debridement, drain placement;  Surgeon: Waddell Leonce NOVAK, MD;  Location: [facility];  Service: Plastics;  Laterality: Right;   MASTECTOMY     MASTECTOMY W/ SENTINEL NODE BIOPSY Right 05/08/2021   Procedure: RIGHT MASTECTOMY WITH SENTINEL LYMPH NODE BIOPSY;  Surgeon: Belinda Cough, MD;  Location: [facility];  Service: General;  Laterality: Right;   MELANOMA EXCISION WITH SENTINEL LYMPH NODE BIOPSY Left 10/24/2012   Procedure: MELANOMA wide EXCISION left lateral thigh WITH SENTINEL LYMPH NODE BIOPSY left groin;  Surgeon: Morene ONEIDA Olives, MD;  Location: [facility];  Service: General;  Laterality: Left;   NASAL SEPTUM SURGERY     PORTACATH PLACEMENT Right 12/02/2020   Procedure: INSERTION PORT-A-CATH;  Surgeon: Belinda Cough, MD;  Location: [facility];  Service: General;  Laterality: Right;   RADIOACTIVE SEED GUIDED AXILLARY SENTINEL LYMPH NODE Right 05/08/2021   Procedure: RADIOACTIVE SEED GUIDED AXILLARY SENTINEL LYMPH NODE DISSECTION;  Surgeon: Belinda Cough, MD;  Location: [facility];  Service: General;  Laterality: Right;   REMOVAL OF TISSUE EXPANDER AND PLACEMENT OF IMPLANT Right 05/05/2022   Procedure: REMOVAL OF TISSUE EXPANDER AND PLACEMENT OF IMPLANT;  Surgeon: Waddell Leonce NOVAK, MD;  Location: [facility];  Service: Government Social [employer];  Laterality: Right;   Patient Active Problem List   Diagnosis Date Noted   Low back pain 02/24/2023   Right hand tendonitis 01/17/2023   Elevated LFTs 12/10/2020   Genetic testing 12/06/2020   Family history of breast cancer 11/20/2020   Personal history of malignant melanoma 11/20/2020   Family history of melanoma 11/20/2020   Malignant neoplasm of upper-outer quadrant of right breast in female, estrogen receptor negative (HCC)  11/12/2020   Vitamin D  deficiency 06/02/2019   Hyperglycemia 06/02/2019   Blood pressure elevated without history of HTN 06/02/2019   Headache 12/16/2018   Urinary incontinence, urge 12/14/2017   Calcific bursitis of shoulder 07/10/2015   Nausea alone 04/27/2013   Melanoma of thigh (HCC) 10/06/2012   Anxiety 11/17/2011   Rosacea 07/08/2011   Right shoulder pain 07/08/2011   Knee effusion, left 08/18/2010   Encounter for well adult exam with abnormal findings 08/18/2010   Attention deficit disorder 03/13/2010   GERD 12/27/2008   Hyperlipidemia 02/23/2007   DEPRESSION 02/23/2007   ALLERGIC RHINITIS 02/23/2007   FATIGUE 02/23/2007   ANEMIA 02/05/2007   Deviated nasal septum 02/05/2007    REFERRING DIAG: right breast cancer at risk for lymphedema  THERAPY DIAG: Aftercare following surgery for neoplasm  PERTINENT HISTORY: 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  She had neoadjuvant chemo, and had  a right mastectomy with immediate expander on 05/08/2021. Completed radiation. Had implant placed on 05/05/22.  Had implant removed on 07/27/22 due to infection. On 09/21/12 had another surgery for debridement and drain placement. Drain removed 10/12/22   PRECAUTIONS: right UE Lymphedema risk, None  SUBJECTIVE: Pt returns for her last 3 month L-Dex screen.   PAIN:  Are you having pain? No  SOZO SCREENING: Patient was assessed today using the SOZO machine to determine the lymphedema index score. This was compared to her baseline score. It was determined that she is within the recommended range when compared to her baseline and no further action is needed at this time. She will continue SOZO screenings. These are done every 3 months for 2 years post operatively followed by every 6 months for 2 years, and then annually.   L-DEX FLOWSHEETS - 04/26/23 0900       L-DEX LYMPHEDEMA SCREENING   Measurement Type  Unilateral    L-DEX MEASUREMENT EXTREMITY Upper Extremity    POSITION  Standing    DOMINANT SIDE Right    At Risk Side Right    BASELINE SCORE (UNILATERAL) 0.9    L-DEX SCORE (UNILATERAL) -1.3    VALUE CHANGE (UNILAT) -2.2            P: Begin 6 month L-Dex screens.   Aden Berwyn Caldron, PTA 04/26/2023, 9:45 AM

## 2023-05-03 ENCOUNTER — Inpatient Hospital Stay: Payer: Managed Care, Other (non HMO) | Attending: Hematology and Oncology | Admitting: Hematology and Oncology

## 2023-05-03 ENCOUNTER — Encounter: Payer: Self-pay | Admitting: Hematology and Oncology

## 2023-05-03 VITALS — BP 145/83 | HR 82 | Temp 98.5°F | Resp 18 | Wt 171.2 lb

## 2023-05-03 DIAGNOSIS — Z9221 Personal history of antineoplastic chemotherapy: Secondary | ICD-10-CM | POA: Insufficient documentation

## 2023-05-03 DIAGNOSIS — C50411 Malignant neoplasm of upper-outer quadrant of right female breast: Secondary | ICD-10-CM

## 2023-05-03 DIAGNOSIS — Z9011 Acquired absence of right breast and nipple: Secondary | ICD-10-CM | POA: Diagnosis not present

## 2023-05-03 DIAGNOSIS — Z853 Personal history of malignant neoplasm of breast: Secondary | ICD-10-CM | POA: Insufficient documentation

## 2023-05-03 DIAGNOSIS — Z923 Personal history of irradiation: Secondary | ICD-10-CM | POA: Diagnosis not present

## 2023-05-03 DIAGNOSIS — Z171 Estrogen receptor negative status [ER-]: Secondary | ICD-10-CM | POA: Diagnosis not present

## 2023-05-03 NOTE — Progress Notes (Signed)
BRIEF ONCOLOGIC HISTORY:  Oncology History  Malignant neoplasm of upper-outer quadrant of right breast in female, estrogen receptor negative (HCC)  11/12/2020 Initial Diagnosis   Malignant neoplasm of upper-outer quadrant of right breast in female, estrogen receptor positive (HCC)   11/20/2020 Cancer Staging   Staging form: Breast, AJCC 8th Edition - Clinical: Stage IIB (cT1c, cN1, cM0, G3, ER-, PR-, HER2-) - Signed by Lowella Dell, MD on 11/20/2020 Histologic grading system: 3 grade system   12/03/2020 - 04/10/2021 Chemotherapy   Patient is on Treatment Plan : BREAST Pembrolizumab + Carboplatin D1 + Paclitaxel D1,8,15 q21d X 4 cycles / Pembrolizumab + AC q21d x 4 cycles     12/03/2020 Genetic Testing   Negative hereditary cancer genetic testing: no pathogenic variants detected in [facility] CancerNext-Expanded +RNAinsight Panel.  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  The report date is December 03, 2020.   The CancerNext-Expanded gene panel offered by [facility] and includes sequencing, rearrangement, and RNA analysis for the following 77 genes: AIP, ALK, APC, ATM, AXIN2, BAP1, BARD1, BLM, BMPR1A, BRCA1, BRCA2, BRIP1, CDC73, CDH1, CDK4, CDKN1B, CDKN2A, CHEK2, CTNNA1, DICER1, FANCC, FH, FLCN, GALNT12, KIF1B, LZTR1, MAX, MEN1, MET, MLH1, MSH2, MSH3, MSH6, MUTYH, NBN, NF1, NF2, NTHL1, PALB2, PHOX2B, PMS2, POT1, PRKAR1A, PTCH1, PTEN, RAD51C, RAD51D, RB1, RECQL, RET, SDHA, SDHAF2, SDHB, SDHC, SDHD, SMAD4, SMARCA4, SMARCB1, SMARCE1, STK11, SUFU, TMEM127, TP53, TSC1, TSC2, VHL and XRCC2 (sequencing and deletion/duplication); EGFR, EGLN1, HOXB13, KIT, MITF, PDGFRA, POLD1, and POLE (sequencing only); EPCAM and GREM1 (deletion/duplication only).    04/29/2021 - 11/04/2021 Chemotherapy   Patient is on Treatment Plan : BREAST Pembrolizumab (200) q21d x 27 weeks     05/08/2021 Cancer Staging   Staging form: Breast, AJCC 8th Edition - Pathologic stage from 05/08/2021: No  Stage Recommended (ypT2, pN1a, cM0, G3, ER-, PR-, HER2-) - Signed by Loa Socks, NP on 05/21/2021 Stage prefix: Post-therapy Histologic grading system: 3 grade system   05/08/2021 Surgery   She had right mastectomy and targeted right axillary dissection on 05/08/2021.  Pathology showed 2.3 x 1.7 x 1.0 cm invasive ductal carcinoma, grade 3 of 3, treatment effect was robust in the breast and minimal in lymph nodes.  All surgical margins negative for invasive carcinoma, out of 4 examined the lymph nodes, 2 with micrometastasis and 0 with micrometastasis or isolated tumor cells.  Largest metastatic deposit in the lymph node was 4 mm and extranodal extension was present.   06/23/2021 - 07/31/2021 Radiation Therapy   06/23/2021 through 07/31/2021 Site Technique Total Dose (Gy) Dose per Fx (Gy) Completed Fx Beam Energies  Chest Wall, Right: CW_R_IMN 3D 50.4/50.4 1.8 28/28 6X  Chest Wall, Right: CW_R_PAB_SCV 3D 50.4/50.4 1.8 28/28 6X, 10X       INTERVAL HISTORY:   [name] is here for follow up.  Discussed the use of AI scribe software for clinical note transcription with the patient, who gave verbal consent to proceed.  History of Present Illness    The patient, a cancer survivor, presents with cognitive difficulties, which she attributes to 'chemo fog.' She reports that these difficulties have made it harder for her to retain numbers for work. She also reports back pain, which has been investigated and attributed to bowel issues. The patient has been managing her stress and maintaining her health through work and vacation. She has also been participating in a [employer]. The patient has a history of breast cancer and has been maintaining regular check-ups, including  mammograms and colonoscopies. She has not reported any new symptoms or health issues since her last visit. Rest of the pertinent 10 point ROS reviewed and neg.  REVIEW OF SYSTEMS:  Review of Systems  Constitutional:   Negative for appetite change, chills, fatigue, fever and unexpected weight change.  HENT:   Negative for hearing loss, lump/mass and trouble swallowing.   Eyes:  Negative for eye problems and icterus.  Respiratory:  Negative for chest tightness, cough and shortness of breath.   Cardiovascular:  Negative for chest pain, leg swelling and palpitations.  Gastrointestinal:  Negative for abdominal distention, abdominal pain, constipation, diarrhea, nausea and vomiting.  Endocrine: Negative for hot flashes.  Genitourinary:  Negative for difficulty urinating.   Musculoskeletal:  Negative for arthralgias and back pain.  Skin:  Negative for itching and rash.  Neurological:  Negative for dizziness, extremity weakness, headaches and numbness.  Hematological:  Negative for adenopathy. Does not bruise/bleed easily.  Psychiatric/Behavioral:  Negative for depression. The patient is not nervous/anxious.   Breast: Denies any new nodularity, masses, tenderness, nipple changes, or nipple discharge.    ONCOLOGY TREATMENT TEAM:  1. Surgeon:  Dr. Corliss Skains at [facility] 2. Medical Oncologist: Dr. Al Pimple  3. Radiation Oncologist: Dr. Basilio Cairo    PAST MEDICAL/SURGICAL HISTORY:  Past Medical History:  Diagnosis Date   Abdominal pain, generalized 12/27/2008   Acute sinusitis, unspecified 02/26/2007   ADD 03/13/2010   ALLERGIC RHINITIS 02/23/2007   ANEMIA 02/05/2007   Anxiety 11/17/2011   ATTENTION DEFICIT DISORDER, HX OF 02/05/2007   Breast cancer (HCC)    Deviated nasal septum 02/05/2007   Family history of breast cancer 11/20/2020   Family history of melanoma 11/20/2020   FATIGUE 02/23/2007   GERD (gastroesophageal reflux disease)    HYPERLIPIDEMIA 02/23/2007   Personal history of chemotherapy    Personal history of malignant melanoma 11/20/2020   Personal history of radiation therapy    Port-A-Cath in place 12/03/2020   SWELLING MASS OR LUMP IN HEAD AND NECK 07/27/2008   UTI 02/23/2007    Past Surgical History:  Procedure Laterality Date   BREAST IMPLANT REMOVAL Right 07/27/2022   Procedure: Removal of right breast implant, washout, drain placemen;  Surgeon: Santiago Glad, MD;  Location: [facility];  Service: Plastics;  Laterality: Right;   BREAST RECONSTRUCTION WITH PLACEMENT OF TISSUE EXPANDER AND FLEX HD (ACELLULAR HYDRATED DERMIS) Right 05/08/2021   Procedure: BREAST RECONSTRUCTION WITH PLACEMENT OF TISSUE EXPANDER AND FLEX HD (ACELLULAR HYDRATED DERMIS);  Surgeon: Allena Napoleon, MD;  Location: [facility];  Service: Plastics;  Laterality: Right;   BROW LIFT     CESAREAN SECTION     COLONOSCOPY  2016   Marina Goodell TA 5 yr recall   COSMETIC SURGERY  1995   Nasal   INCISION AND DRAINAGE OF WOUND Right 09/22/2022   Procedure: right breast washout, debridement, drain placement;  Surgeon: Santiago Glad, MD;  Location: [facility];  Service: Plastics;  Laterality: Right;   MASTECTOMY     MASTECTOMY W/ SENTINEL NODE BIOPSY Right 05/08/2021   Procedure: RIGHT MASTECTOMY WITH SENTINEL LYMPH NODE BIOPSY;  Surgeon: Manus Rudd, MD;  Location: [facility];  Service: General;  Laterality: Right;   MELANOMA EXCISION WITH SENTINEL LYMPH NODE BIOPSY Left 10/24/2012   Procedure: MELANOMA wide EXCISION left lateral thigh WITH SENTINEL LYMPH NODE BIOPSY left groin;  Surgeon: Mariella Saa, MD;  Location: [facility];  Service: General;  Laterality: Left;  NASAL SEPTUM SURGERY     PORTACATH PLACEMENT Right 12/02/2020   Procedure: INSERTION PORT-A-CATH;  Surgeon: Manus Rudd, MD;  Location: [facility];  Service: General;  Laterality: Right;   RADIOACTIVE SEED GUIDED AXILLARY SENTINEL LYMPH NODE Right 05/08/2021   Procedure: RADIOACTIVE SEED GUIDED AXILLARY SENTINEL LYMPH NODE DISSECTION;  Surgeon: Manus Rudd, MD;  Location: [facility];  Service: General;  Laterality: Right;   REMOVAL OF  TISSUE EXPANDER AND PLACEMENT OF IMPLANT Right 05/05/2022   Procedure: REMOVAL OF TISSUE EXPANDER AND PLACEMENT OF IMPLANT;  Surgeon: Santiago Glad, MD;  Location: [facility];  Service: Plastics;  Laterality: Right;     ALLERGIES:  Allergies  Allergen Reactions   Promethazine Hcl     Legs twitch     CURRENT MEDICATIONS:  Outpatient Encounter Medications as of 05/03/2023  Medication Sig   amphetamine-dextroamphetamine (ADDERALL) 20 MG tablet Take 1.5 tabs by mouth twice per day   Berberine Chloride (BERBERINE HCI PO) Take by mouth.   cyclobenzaprine (FLEXERIL) 5 MG tablet Take 1 tablet (5 mg total) by mouth 3 (three) times daily as needed.   melatonin 5 MG TABS Take 5 mg by mouth at bedtime as needed (sleep).   meloxicam (MOBIC) 15 MG tablet Take 1 tablet (15 mg total) by mouth daily as needed for pain.   OVER THE COUNTER MEDICATION Bosmed QD   OVER THE COUNTER MEDICATION ANDEROGRAPHIS QD   TURMERIC CURCUMIN PO Take 750 mg by mouth daily.   No facility-administered encounter medications on file as of 05/03/2023.     ONCOLOGIC FAMILY HISTORY:  Family History  Problem Relation Age of Onset   Hypertension Mother    Breast cancer Mother 73   COPD Father    Atrial fibrillation Father    Atrial fibrillation Sister    Kidney cancer Maternal Aunt        dx 33s   Leukemia Paternal Aunt        dx after 18   Cancer Paternal Aunt        unknown type; dx after 69   Brain cancer Paternal Grandfather 65       brain tumor per pt   Melanoma Cousin        arm; no mets per pt   Alcohol abuse Other    Depression Other    Glaucoma Other    Breast cancer Other        MGM's sisters, x3, dx after 43   Colon polyps Neg Hx    Crohn's disease Neg Hx    Esophageal cancer Neg Hx    Stomach cancer Neg Hx    Rectal cancer Neg Hx      SOCIAL HISTORY:  Social History   Socioeconomic History   Marital status: Married    Spouse name: Geologist, engineering   Number of children: 2    Years of education: Not on file   Highest education level: Master's degree (e.g., MA, MS, MEng, MEd, MSW, MBA)  Occupational History   Occupation: master's in fine arts [employer]    Employer: [employer]  Tobacco Use   Smoking status: Never   Smokeless tobacco: Never  Vaping Use   Vaping status: Never Used  Substance and Sexual Activity   Alcohol use: Not Currently    Comment: Very rare occasions   Drug use: Never   Sexual activity: Yes    Birth control/protection: None, Post-menopausal  Other Topics Concern   Not on file  Social History Narrative   Not on file   Social Drivers of Health   Financial Resource Strain: Low Risk  (02/20/2023)   Overall Financial Resource Strain (CARDIA)    Difficulty of Paying Living Expenses: Not hard at all  Food Insecurity: No Food Insecurity (02/20/2023)   Hunger Vital Sign    Worried About Running Out of Food in the Last Year: Never true    Ran Out of Food in the Last Year: Never true  Transportation Needs: No Transportation Needs (02/20/2023)   PRAPARE - Administrator, Civil Service (Medical): No    Lack of Transportation (Non-Medical): No  Physical Activity: Insufficiently Active (02/20/2023)   Exercise Vital Sign    Days of Exercise per Week: 2 days    Minutes of Exercise per Session: 30 min  Stress: Stress Concern Present (02/20/2023)   [employer] of Occupational Health - Occupational Stress Questionnaire    Feeling of Stress : To some extent  Social Connections: Socially Integrated (02/20/2023)   Social Connection and Isolation Panel [NHANES]    Frequency of Communication with Friends and Family: More than three times a week    Frequency of Social Gatherings with Friends and Family: More than three times a week    Attends Religious Services: More than 4 times per year    Active Member of [employer] or Organizations: Yes    Attends Engineer, structural: More than 4 times per year    Marital Status: Married   Catering manager Violence: Not on file     OBSERVATIONS/OBJECTIVE:  BP (!) 145/83 Comment: MD notified  Pulse 82   Temp 98.5 F (36.9 C) (Temporal)   Resp 18   Wt 171 lb 3.2 oz (77.7 kg)   LMP 04/27/2016   SpO2 98%   BMI 27.63 kg/m   GENERAL: Patient is a well appearing female in no acute distress  NODES:  No cervical, supraclavicular, or axillary lymphadenopathy palpated.  BREAST EXAM: Status post right breast mastectomy.  No palpable concerns. No regional adenopathy LUNGS:  Clear to auscultation bilaterally.  No wheezes or rhonchi. HEART:  Regular rate and rhythm. No murmur appreciated. ABDOMEN:  Soft, nontender.  Positive, normoactive bowel sounds. No organomegaly palpated. EXTREMITIES:  No peripheral edema.   SKIN:  Clear with no obvious rashes or skin changes. No nail dyscrasia. NEURO:  Nonfocal. Well oriented.  Appropriate affect. No change.  LABORATORY DATA:  None for this visit.  DIAGNOSTIC IMAGING:  None for this visit.      ASSESSMENT AND PLAN:   ASSESSMENT: 58 y.o. [place] woman with T1c N1, stage IIB invasive ductal carcinoma, grade 3, triple negative, with an MIB-1 of 30%              (1) genetics test 12/03/2020 through the [facility] CancerNext-Expanded +RNAinsight Panel found no deleterious mutations in AIP, ALK, APC, ATM, AXIN2, BAP1, BARD1, BLM, BMPR1A, BRCA1, BRCA2, BRIP1, CDC73, CDH1, CDK4, CDKN1B, CDKN2A, CHEK2, CTNNA1, DICER1, FANCC, FH, FLCN, GALNT12, KIF1B, LZTR1, MAX, MEN1, MET, MLH1, MSH2, MSH3, MSH6, MUTYH, NBN, NF1, NF2, NTHL1, PALB2, PHOX2B, PMS2, POT1, PRKAR1A, PTCH1, PTEN, RAD51C, RAD51D, RB1, RECQL, RET, SDHA, SDHAF2, SDHB, SDHC, SDHD, SMAD4, SMARCA4, SMARCB1, SMARCE1, STK11, SUFU, TMEM127, TP53, TSC1, TSC2, VHL and XRCC2 (sequencing and deletion/duplication); EGFR, EGLN1, HOXB13, KIT, MITF, PDGFRA, POLD1, and POLE (sequencing only); EPCAM and GREM1 (deletion/duplication only).     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   She underwent right mastectomy and targeted lymph node dissection, final pathology showing tumor measuring 2.3 cm in largest dimension, grade 3, negative margins with excellent response in the breast however minimal response in the lymph node, 2 out of 4 lymph nodes positive for involvement, extranodal extension present, largest metastasis is 4 mm.  PLAN  Post-chemotherapy cognitive impairment Patient reports difficulty retaining numbers for work, suggestive of "chemo fog". Discussed potential participation in a cognitive improvement study at [facility]. -Consider participation in cognitive improvement study.  Weight gain and diet Patient reports weight gain and a desire to improve gut biome after chemotherapy. -Increase intake of cruciferous vegetables and fermented foods to enhance gut biome.  Shoulder discomfort Patient reports ongoing shoulder discomfort. -Continue current management strategies.  Breast cancer surveillance Discussed the use of complementary tumor DNA testing ([facility] Reveal) for surveillance. -She is interested. She will contact the [employer] representative. She understands the drawbacks of the test.  General Health Maintenance -Order mammogram for July 2025. -Follow-up in 6 months.Total time spent: 30 min  *Total Encounter Time as defined by the Centers for Medicare and Medicaid Services includes, in addition to the face-to-face time of a patient visit (documented in the note above) non-face-to-face time: obtaining and reviewing outside history, ordering and  reviewing medications, tests or procedures, care coordination (communications with other health care professionals or caregivers) and documentation in the medical record.

## 2023-05-04 ENCOUNTER — Telehealth: Payer: Self-pay

## 2023-05-04 NOTE — Telephone Encounter (Signed)
[id number]: COGNITIVE TRAINING FOR CANCER RELATED COGNITIVE IMPAIRMENT IN BREAST CANCER SURVIVORS: A MULTI-CENTER RANDOMIZED DOUBLE- BLINDED CONTROLLED TRIAL   Received referral from Dr Al Pimple. Patient is ineligible due to history of melanoma. Attempted to call patient; no answer, left VM requesting call back. Notified Dr Al Pimple via inbasket.  Margret Chance Arzell Mcgeehan, RN, BSN, [facility] She  Her  Hers Clinical Research Nurse [facility] Direct Dial [phone] 05/04/2023 2:02 PM

## 2023-05-06 ENCOUNTER — Telehealth: Payer: Self-pay

## 2023-05-06 NOTE — Telephone Encounter (Signed)
[id number]: COGNITIVE TRAINING FOR CANCER RELATED COGNITIVE IMPAIRMENT IN BREAST CANCER SURVIVORS: A MULTI-CENTER RANDOMIZED DOUBLE- BLINDED CONTROLLED TRIAL   Attempted to reach patient by phone again. No answer, left voicemail referring patient to her MyChart to see this note. Patient is ineligible for this study due to history of melanoma. I can be reached at [phone] if there are further questions.  Margret Chance Laramie Meissner, RN, BSN, [facility] She  Her  Hers Clinical Research Nurse [facility] Direct Dial [phone] 05/06/2023 10:23 AM

## 2023-07-23 IMAGING — MG MM BREAST LOCALIZATION CLIP
4 series · 4 of 12 positions shown · non-contrast
Comparison: Previous exam(s).

CLINICAL DATA: Patient status post ultrasound-guided core needle
biopsy 2 right breast masses and right axillary node.

EXAM:
3D DIAGNOSTIC RIGHT MAMMOGRAM POST ULTRASOUND BIOPSY

[R CC synth-2D]
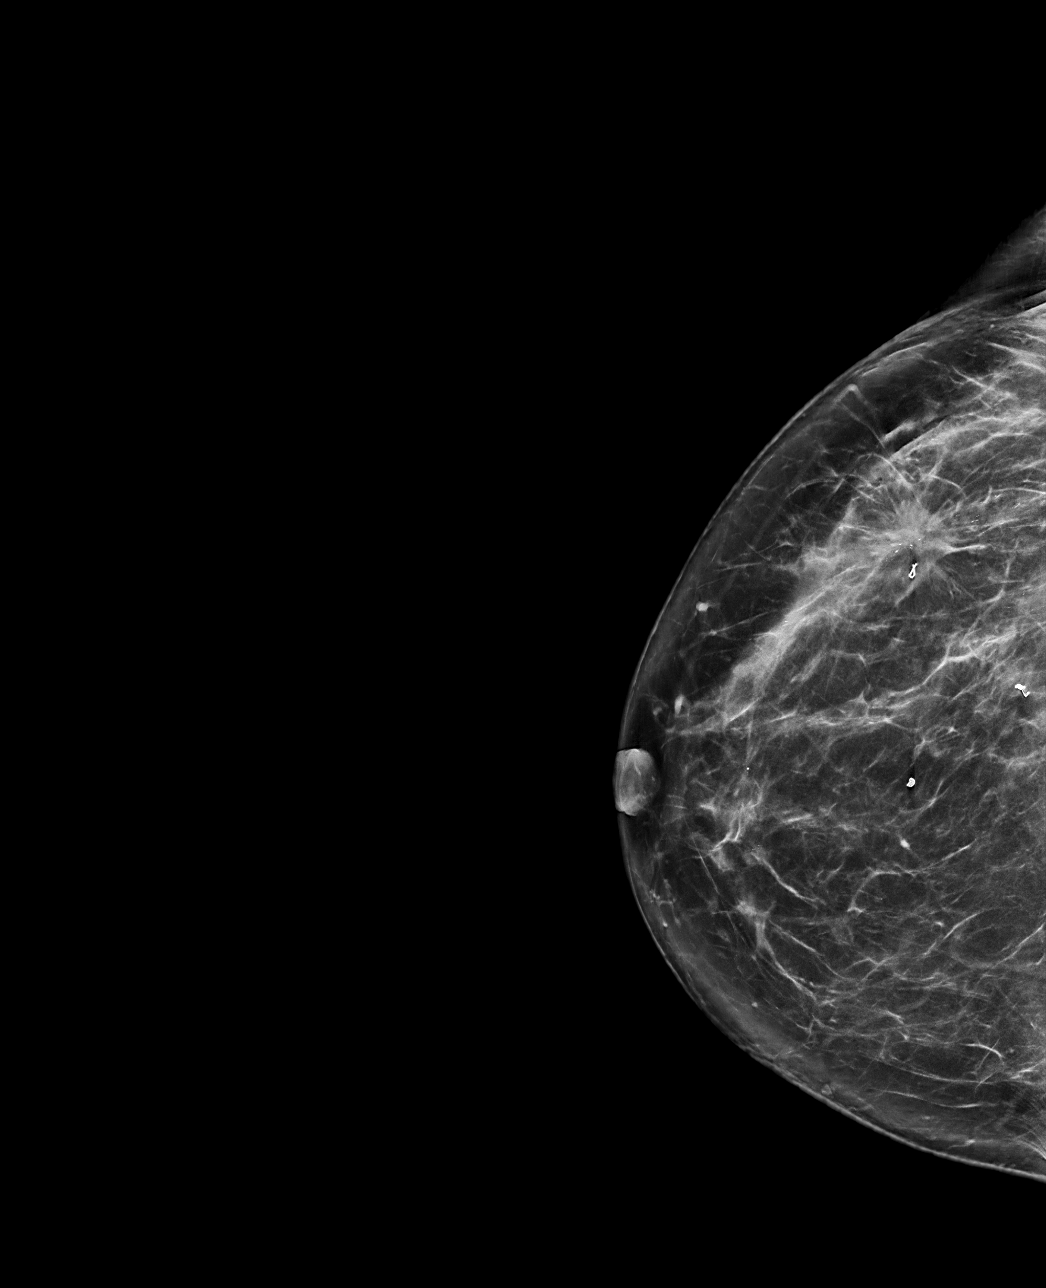

[R ML synth-2D]
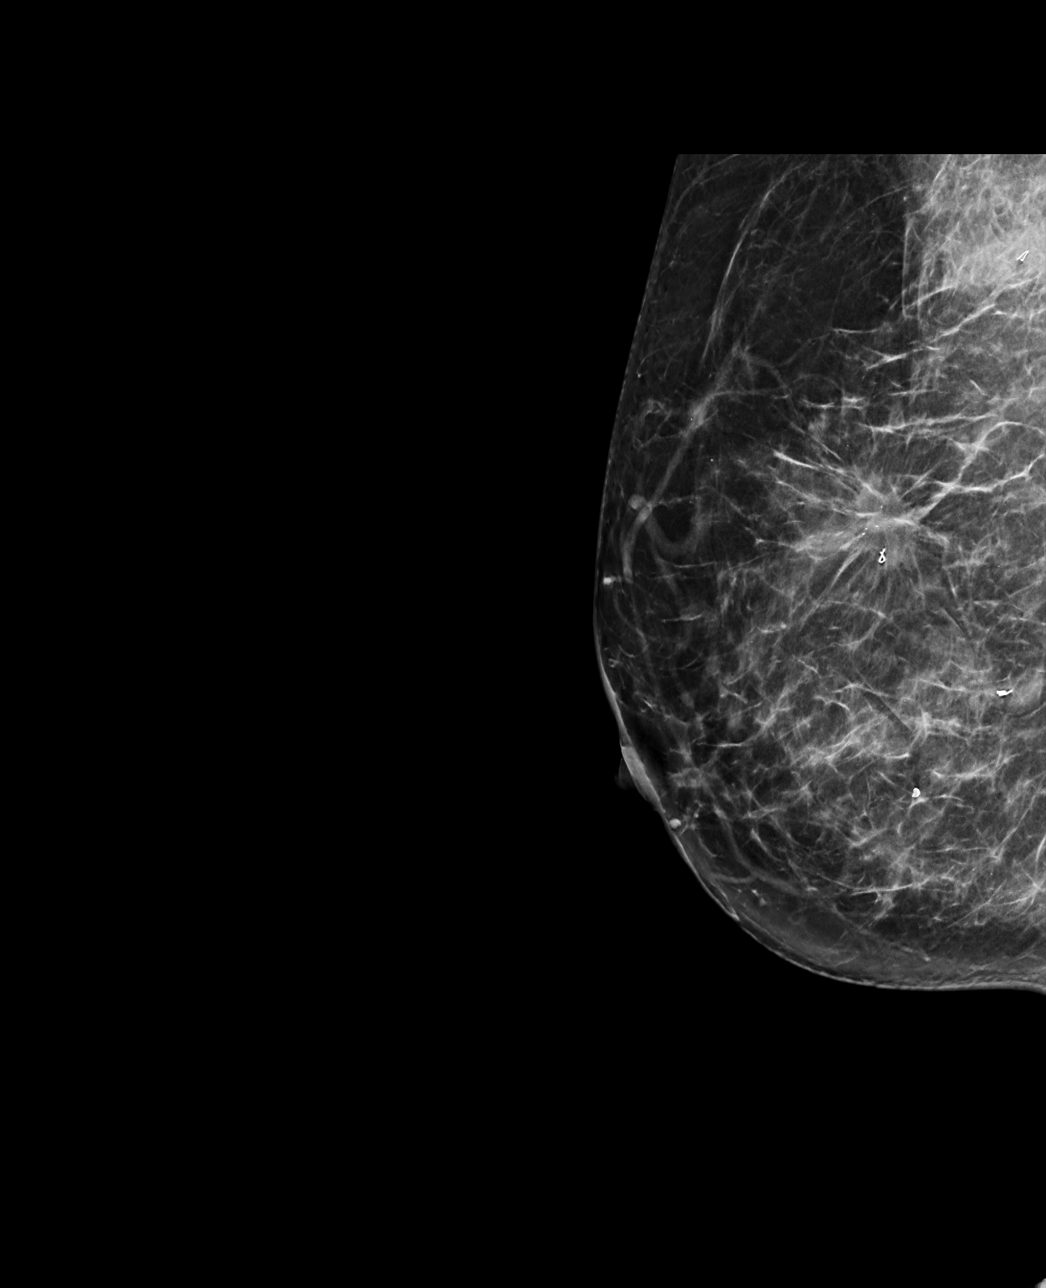

[R CC tomo · tomo slice 39/77.0]
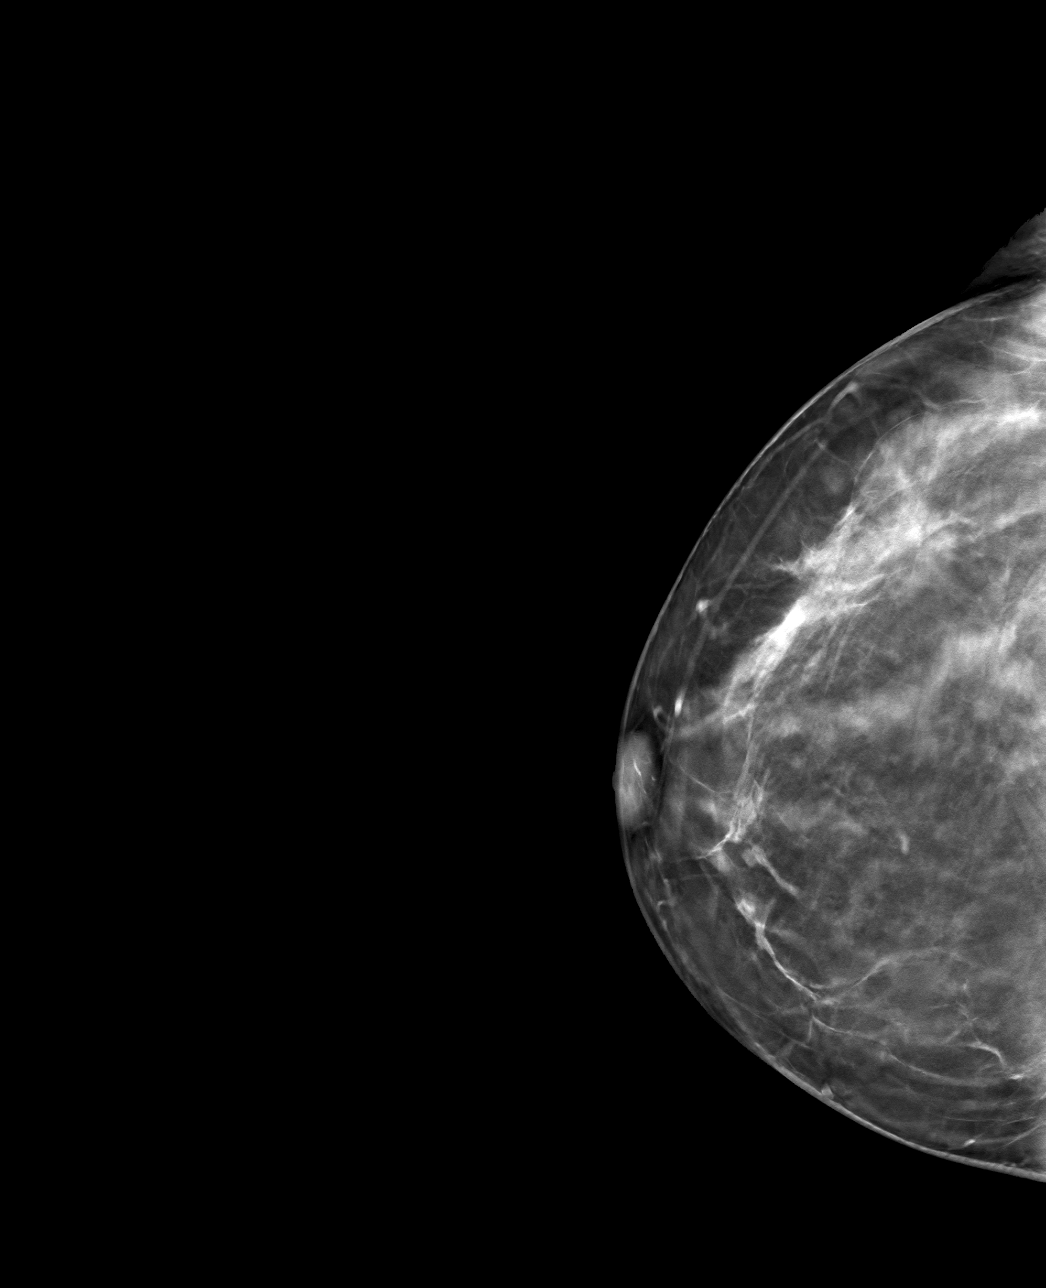

[R ML tomo · tomo slice 36/71.0]
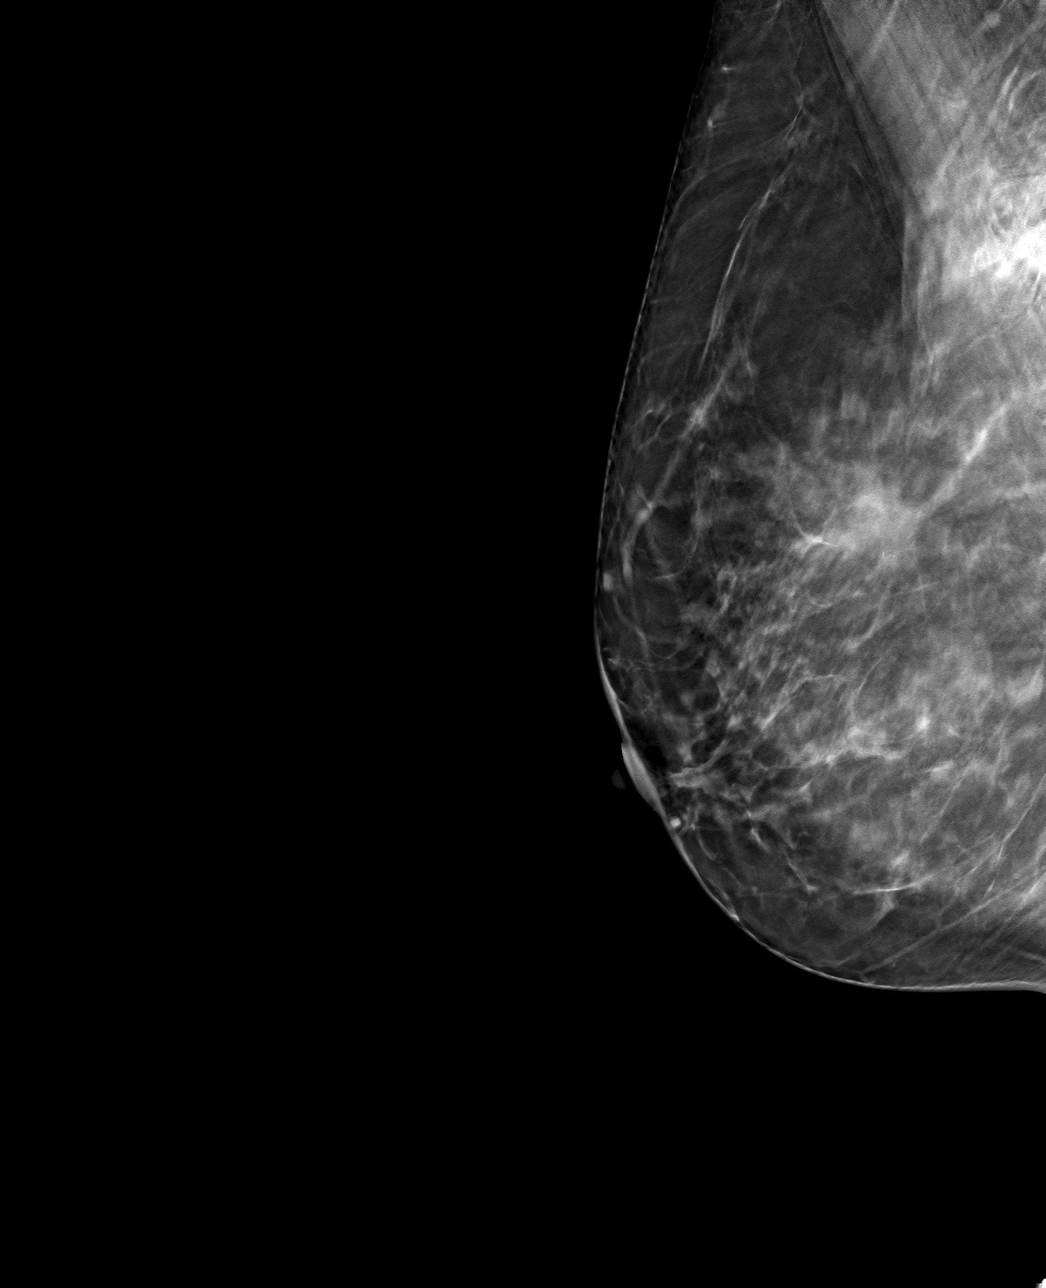

[4 of 12 positions shown; findings below may reference images not displayed]

FINDINGS: 3D Mammographic images were obtained following ultrasound guided
biopsy of 2 right breast masses and low right axillary node.

Site 1: Right breast mass 10 o'clock position: Ribbon shaped clip:
In appropriate position.

Site 2: Right breast mass 9 o'clock position: Coil shaped clip: In
appropriate position.

Site 3: Right axilla: Heart shaped clip: In appropriate position.
IMPRESSION: Appropriate positioning of the biopsy clips after biopsies as above.

Final Assessment: Post Procedure Mammograms for Marker Placement

## 2023-08-11 ENCOUNTER — Ambulatory Visit
Admission: RE | Admit: 2023-08-11 | Discharge: 2023-08-11 | Disposition: A | Discharge status: Home / Self Care | Payer: Managed Care, Other (non HMO) | Source: Ambulatory Visit | Attending: Obstetrics and Gynecology | Admitting: Obstetrics and Gynecology

## 2023-08-11 DIAGNOSIS — E2839 Other primary ovarian failure: Secondary | ICD-10-CM

## 2023-08-12 ENCOUNTER — Ambulatory Visit: Payer: Managed Care, Other (non HMO) | Admitting: Internal Medicine

## 2023-08-12 ENCOUNTER — Encounter: Payer: Self-pay | Admitting: Internal Medicine

## 2023-08-12 VITALS — BP 124/76 | HR 100 | Temp 98.9°F | Ht 66.0 in | Wt 176.0 lb

## 2023-08-12 DIAGNOSIS — N761 Subacute and chronic vaginitis: Secondary | ICD-10-CM

## 2023-08-12 DIAGNOSIS — E559 Vitamin D deficiency, unspecified: Secondary | ICD-10-CM

## 2023-08-12 DIAGNOSIS — E7800 Pure hypercholesterolemia, unspecified: Secondary | ICD-10-CM

## 2023-08-12 DIAGNOSIS — R739 Hyperglycemia, unspecified: Secondary | ICD-10-CM | POA: Diagnosis not present

## 2023-08-12 DIAGNOSIS — R32 Unspecified urinary incontinence: Secondary | ICD-10-CM | POA: Insufficient documentation

## 2023-08-12 DIAGNOSIS — N760 Acute vaginitis: Secondary | ICD-10-CM | POA: Insufficient documentation

## 2023-08-12 DIAGNOSIS — M5416 Radiculopathy, lumbar region: Secondary | ICD-10-CM | POA: Diagnosis not present

## 2023-08-12 DIAGNOSIS — K5909 Other constipation: Secondary | ICD-10-CM

## 2023-08-12 DIAGNOSIS — F988 Other specified behavioral and emotional disorders with onset usually occurring in childhood and adolescence: Secondary | ICD-10-CM

## 2023-08-12 DIAGNOSIS — E538 Deficiency of other specified B group vitamins: Secondary | ICD-10-CM

## 2023-08-12 DIAGNOSIS — Z0000 Encounter for general adult medical examination without abnormal findings: Secondary | ICD-10-CM

## 2023-08-12 DIAGNOSIS — Z0001 Encounter for general adult medical examination with abnormal findings: Secondary | ICD-10-CM

## 2023-08-12 LAB — HEPATIC FUNCTION PANEL
ALT: 15 U/L (ref 0–35)
AST: 19 U/L (ref 0–37)
Albumin: 4.5 g/dL (ref 3.5–5.2)
Alkaline Phosphatase: 96 U/L (ref 39–117)
Bilirubin, Direct: 0 mg/dL (ref 0.0–0.3)
Total Bilirubin: 0.5 mg/dL (ref 0.2–1.2)
Total Protein: 7.3 g/dL (ref 6.0–8.3)

## 2023-08-12 LAB — CBC WITH DIFFERENTIAL/PLATELET
Basophils Absolute: 0 10*3/uL (ref 0.0–0.1)
Basophils Relative: 0.4 % (ref 0.0–3.0)
Eosinophils Absolute: 0 10*3/uL (ref 0.0–0.7)
Eosinophils Relative: 1 % (ref 0.0–5.0)
HCT: 39.9 % (ref 36.0–46.0)
Hemoglobin: 13.5 g/dL (ref 12.0–15.0)
Lymphocytes Relative: 22.3 % (ref 12.0–46.0)
Lymphs Abs: 0.7 10*3/uL (ref 0.7–4.0)
MCHC: 33.9 g/dL (ref 30.0–36.0)
MCV: 96.2 fl (ref 78.0–100.0)
Monocytes Absolute: 0.4 10*3/uL (ref 0.1–1.0)
Monocytes Relative: 13 % — ABNORMAL HIGH (ref 3.0–12.0)
Neutro Abs: 2.1 10*3/uL (ref 1.4–7.7)
Neutrophils Relative %: 63.3 % (ref 43.0–77.0)
Platelets: 206 10*3/uL (ref 150.0–400.0)
RBC: 4.15 Mil/uL (ref 3.87–5.11)
RDW: 13.4 % (ref 11.5–15.5)
WBC: 3.3 10*3/uL — ABNORMAL LOW (ref 4.0–10.5)

## 2023-08-12 LAB — BASIC METABOLIC PANEL WITH GFR
BUN: 14 mg/dL (ref 6–23)
CO2: 25 meq/L (ref 19–32)
Calcium: 9.2 mg/dL (ref 8.4–10.5)
Chloride: 104 meq/L (ref 96–112)
Creatinine, Ser: 0.8 mg/dL (ref 0.40–1.20)
GFR: 80.3 mL/min (ref >60.00)
Glucose, Bld: 95 mg/dL (ref 70–99)
Potassium: 3.9 meq/L (ref 3.5–5.1)
Sodium: 139 meq/L (ref 135–145)

## 2023-08-12 LAB — VITAMIN D 25 HYDROXY (VIT D DEFICIENCY, FRACTURES): VITD: 23.04 ng/mL — ABNORMAL LOW (ref 30.00–100.00)

## 2023-08-12 LAB — URINALYSIS, ROUTINE W REFLEX MICROSCOPIC
Bilirubin Urine: NEGATIVE
Hgb urine dipstick: NEGATIVE
Ketones, ur: NEGATIVE
Leukocytes,Ua: NEGATIVE
Nitrite: NEGATIVE
RBC / HPF: NONE SEEN (ref 0–?)
Specific Gravity, Urine: 1.015 (ref 1.000–1.030)
Total Protein, Urine: NEGATIVE
Urine Glucose: NEGATIVE
Urobilinogen, UA: 0.2 (ref 0.0–1.0)
pH: 6.5 (ref 5.0–8.0)

## 2023-08-12 LAB — LIPID PANEL
Cholesterol: 294 mg/dL — ABNORMAL HIGH (ref 0–200)
HDL: 69.2 mg/dL (ref >39.00)
LDL Cholesterol: 206 mg/dL — ABNORMAL HIGH (ref 0–99)
NonHDL: 225.02
Total CHOL/HDL Ratio: 4
Triglycerides: 94 mg/dL (ref 0.0–149.0)
VLDL: 18.8 mg/dL (ref 0.0–40.0)

## 2023-08-12 LAB — VITAMIN B12: Vitamin B-12: 432 pg/mL (ref 211–911)

## 2023-08-12 LAB — TSH: TSH: 1.89 u[IU]/mL (ref 0.35–5.50)

## 2023-08-12 LAB — MICROALBUMIN / CREATININE URINE RATIO
Creatinine,U: 98.4 mg/dL
Microalb Creat Ratio: 8.7 mg/g (ref 0.0–30.0)
Microalb, Ur: 0.9 mg/dL (ref 0.0–1.9)

## 2023-08-12 LAB — HEMOGLOBIN A1C: Hgb A1c MFr Bld: 5.2 % (ref 4.6–6.5)

## 2023-08-12 MED ORDER — AZITHROMYCIN 250 MG PO TABS
ORAL_TABLET | ORAL | 1 refills | Status: AC
Start: 2023-08-12 — End: 2023-08-17

## 2023-08-12 MED ORDER — AMPHETAMINE-DEXTROAMPHETAMINE 20 MG PO TABS: ORAL_TABLET | ORAL | 0 refills | Status: AC

## 2023-08-12 MED ORDER — METRONIDAZOLE 500 MG PO TABS: 500.0000 mg | ORAL_TABLET | Freq: Three times a day (TID) | ORAL | 0 refills | Status: AC

## 2023-08-12 MED ORDER — PREMARIN 0.625 MG/GM VA CREA: 1.0000 | TOPICAL_CREAM | Freq: Every day | VAGINAL | 12 refills | Status: DC

## 2023-08-12 MED ORDER — TRULANCE 3 MG PO TABS: ORAL_TABLET | ORAL | 11 refills | Status: AC

## 2023-08-12 NOTE — Patient Instructions (Signed)
 Please take all new medication as prescribed - the metronidazole  antibx, zpack, estrogen cream, and trulance  for constipation  Please continue all other medications as before, and refills have been done if requested - the adderall  Please have the pharmacy call with any other refills you may need.  Please continue your efforts at being more active, low cholesterol diet, and weight control.  You are otherwise up to date with prevention measures today.  Please keep your appointments with your specialists as you may have planned  You will be contacted regarding the referral for: MRI LS spine, and Urology  Please go to the LAB at the blood drawing area for the tests to be done  You will be contacted by phone if any changes need to be made immediately.  Otherwise, you will receive a letter about your results with an explanation, but please check with MyChart first.  Please make an Appointment to return in 6 months, or sooner if needed

## 2023-08-12 NOTE — Assessment & Plan Note (Signed)
Last vitamin D Lab Results  Component Value Date   VD25OH 25.99 (L) 08/11/2022   Low, to start oral replacement

## 2023-08-12 NOTE — Assessment & Plan Note (Signed)
 Likely IBS related , for trulance  3 mg qd

## 2023-08-12 NOTE — Assessment & Plan Note (Signed)
 Stable, to continue the adderall

## 2023-08-12 NOTE — Assessment & Plan Note (Signed)
 Lab Results  Component Value Date   LDLCALC 194 (H) 08/11/2022   Uncontrolled, recent card ct score was Zero, declines statin

## 2023-08-12 NOTE — Assessment & Plan Note (Signed)
 Worsening pain and present prolonged time without resolution, for LS Spine MRI

## 2023-08-12 NOTE — Progress Notes (Signed)
 Patient ID: Robin Farley, female   DOB: Nov 04, 1965, 58 y.o.   MRN: [id number]         Chief Complaint:: wellness exam and add, sinusitis, right lumbar radiculopathy, chronic constipation, urinary frequency in addition to urinary incontinence, vaginitis       HPI:  Robin Farley is a 58 y.o. female here for wellness exam; for shingrix and prevnar 20 at pharmacy, o/w up to date.   Last seen per GYN < 1 yr.                         Also brother in law and nephew passed recently.  ADD med working well, but has some sadness,  Denies worsening depressive symptoms, suicidal ideation, or panic; has ongoing anxiety.  Does also have persistent constipation for > 1 yr, not always amenable to miralax , mag citrate and senakot.   Here with 2-3 days acute onset fever, facial pain, pressure, headache, general weakness and malaise, and greenish d/c, with mild ST and cough, but pt denies chest pain, wheezing, increased sob or doe, orthopnea, PND, increased LE swelling, palpitations, dizziness or syncope.   Pt denies polydipsia, polyuria, or new focal neuro s/s.    Pt denies fever, wt loss, night sweats, loss of appetite, or other constitutional symptoms  but does have dyspareunia and small vaginal dc ,asks to restart the estrogen cream even though did not work well for urinary incontinence.  Also with some urinray frequency in last few days.  Also with chronic persistent right lower back pain for over 1 yr, now mod to occasionally severe, with some raidation to right leg.     Wt Readings from Last 3 Encounters:  08/12/23 176 lb (79.8 kg)  05/03/23 171 lb 3.2 oz (77.7 kg)  04/26/23 172 lb (78 kg)   BP Readings from Last 3 Encounters:  08/12/23 124/76  05/03/23 (!) 145/83  03/02/23 134/75   Immunization History  Administered Date(s) Administered   Influenza Whole 01/25/2008   PFIZER(Purple Top)SARS-COV-2 Vaccination 11/04/2019, 12/01/2019   Td 04/13/1998, 03/13/2010   Health Maintenance Due  Topic Date Due    Cervical Cancer Screening (HPV/Pap Cotest)  09/12/2014      Past Medical History:  Diagnosis Date   Abdominal pain, generalized 12/27/2008   Acute sinusitis, unspecified 02/26/2007   ADD 03/13/2010   ALLERGIC RHINITIS 02/23/2007   ANEMIA 02/05/2007   Anxiety 11/17/2011   ATTENTION DEFICIT DISORDER, HX OF 02/05/2007   Breast cancer (HCC)    Deviated nasal septum 02/05/2007   Family history of breast cancer 11/20/2020   Family history of melanoma 11/20/2020   FATIGUE 02/23/2007   GERD (gastroesophageal reflux disease)    HYPERLIPIDEMIA 02/23/2007   Personal history of chemotherapy    Personal history of malignant melanoma 11/20/2020   Personal history of radiation therapy    Port-A-Cath in place 12/03/2020   SWELLING MASS OR LUMP IN HEAD AND NECK 07/27/2008   UTI 02/23/2007   Past Surgical History:  Procedure Laterality Date   BREAST IMPLANT REMOVAL Right 07/27/2022   Procedure: Removal of right breast implant, washout, drain placemen;  Surgeon: Teretha Ferguson, MD;  Location: [facility];  Service: Plastics;  Laterality: Right;   BREAST RECONSTRUCTION WITH PLACEMENT OF TISSUE EXPANDER AND FLEX HD (ACELLULAR HYDRATED DERMIS) Right 05/08/2021   Procedure: BREAST RECONSTRUCTION WITH PLACEMENT OF TISSUE EXPANDER AND FLEX HD (ACELLULAR HYDRATED DERMIS);  Surgeon: Barb Bonito, MD;  Location: [facility];  Service: Government social research officer;  Laterality: Right;   BROW LIFT     CESAREAN SECTION     COLONOSCOPY  2016   Elvin Hammer TA 5 yr recall   COSMETIC SURGERY  1995   Nasal   INCISION AND DRAINAGE OF WOUND Right 09/22/2022   Procedure: right breast washout, debridement, drain placement;  Surgeon: Teretha Ferguson, MD;  Location: [facility];  Service: Plastics;  Laterality: Right;   MASTECTOMY     MASTECTOMY W/ SENTINEL NODE BIOPSY Right 05/08/2021   Procedure: RIGHT MASTECTOMY WITH SENTINEL LYMPH NODE BIOPSY;  Surgeon: Dareen Ebbing, MD;  Location: [facility];  Service: General;  Laterality: Right;   MELANOMA EXCISION WITH SENTINEL LYMPH NODE BIOPSY Left 10/24/2012   Procedure: MELANOMA wide EXCISION left lateral thigh WITH SENTINEL LYMPH NODE BIOPSY left groin;  Surgeon: Quitman Bucy, MD;  Location: [facility];  Service: General;  Laterality: Left;   NASAL SEPTUM SURGERY     PORTACATH PLACEMENT Right 12/02/2020   Procedure: INSERTION PORT-A-CATH;  Surgeon: Dareen Ebbing, MD;  Location: [facility];  Service: General;  Laterality: Right;   RADIOACTIVE SEED GUIDED AXILLARY SENTINEL LYMPH NODE Right 05/08/2021   Procedure: RADIOACTIVE SEED GUIDED AXILLARY SENTINEL LYMPH NODE DISSECTION;  Surgeon: Dareen Ebbing, MD;  Location: [facility];  Service: General;  Laterality: Right;   REMOVAL OF TISSUE EXPANDER AND PLACEMENT OF IMPLANT Right 05/05/2022   Procedure: REMOVAL OF TISSUE EXPANDER AND PLACEMENT OF IMPLANT;  Surgeon: Teretha Ferguson, MD;  Location:  [facility];  Service: Plastics;  Laterality: Right;    reports that she has never smoked. She has never used smokeless tobacco. She reports that she does not currently use alcohol. She reports that she does not use drugs. family history includes Alcohol abuse in an other family member; Atrial fibrillation in her father and sister; Brain cancer (age of onset: 7) in her paternal grandfather; Breast cancer in an other family member; Breast cancer (age of onset: 102) in her mother; COPD in her father; Cancer in her paternal aunt; Depression in an other family member; Glaucoma in an other family member; Hypertension in her mother; Kidney cancer in her maternal aunt; Leukemia in her paternal aunt; Melanoma in her cousin. Allergies  Allergen Reactions   Promethazine  Hcl     Legs twitch   Current Outpatient Medications on File Prior to Visit  Medication Sig Dispense Refill   Berberine Chloride (BERBERINE HCI PO) Take by mouth.      cyclobenzaprine  (FLEXERIL ) 5 MG tablet Take 1 tablet (5 mg total) by mouth 3 (three) times daily as needed. 40 tablet 1   melatonin 5 MG TABS Take 5 mg by mouth at bedtime as needed (sleep).     meloxicam  (MOBIC ) 15 MG tablet Take 1 tablet (15 mg total) by mouth daily as needed for pain. 90 tablet 1   Na Sulfate-K Sulfate-Mg Sulfate concentrate (SUPREP) 17.5-3.13-1.6 GM/177ML SOLN as directed.     OVER THE COUNTER MEDICATION Bosmed QD     OVER THE COUNTER MEDICATION ANDEROGRAPHIS QD     TURMERIC CURCUMIN PO Take 750 mg by mouth daily.     No current facility-administered medications on file prior to visit.        ROS:  All others reviewed and negative.  Objective        PE:  BP 124/76 (BP Location: Right Arm, Patient Position: Sitting, Cuff Size: Normal)   Pulse 100  Temp 98.9 F (37.2 C) (Oral)   Ht 5\' 6"  (1.676 m)   Wt 176 lb (79.8 kg)   LMP 04/27/2016   SpO2 98%   BMI 28.41 kg/m                 Constitutional: Pt appears in NAD               HENT: Head: NCAT.                Right Ear: External ear normal.                 Left Ear: External ear normal.                Eyes: . Pupils are equal, round, and reactive to light. Conjunctivae and EOM are normal               Nose: without d/c or deformity               Neck: Neck supple. Gross normal ROM               Cardiovascular: Normal rate and regular rhythm.                 Pulmonary/Chest: Effort normal and breath sounds without rales or wheezing.                Abd:  Soft, NT, ND, + BS, no organomegaly               Neurological: Pt is alert. At baseline orientation, motor grossly intact               Skin: Skin is warm. No rashes, no other new lesions, LE edema - none               Psychiatric: Pt behavior is normal without agitation   Micro: none  Cardiac tracings I have personally interpreted today:  none  Pertinent Radiological findings (summarize): none   Lab Results  Component Value Date   WBC 5.6 08/11/2022    HGB 12.7 08/11/2022   HCT 37.5 08/11/2022   PLT 242.0 08/11/2022   GLUCOSE 84 08/11/2022   CHOL 290 (H) 08/11/2022   TRIG 172.0 (H) 08/11/2022   HDL 61.50 08/11/2022   LDLDIRECT 159.0 08/21/2014   LDLCALC 194 (H) 08/11/2022   ALT 23 08/11/2022   AST 22 08/11/2022   NA 140 08/11/2022   K 4.6 08/11/2022   CL 104 08/11/2022   CREATININE 0.82 08/11/2022   BUN 16 08/11/2022   CO2 28 08/11/2022   TSH 2.42 08/11/2022   HGBA1C 5.4 08/11/2022   Assessment/Plan:  Robin Farley is a 58 y.o. White or Caucasian [1] female with  has a past medical history of Abdominal pain, generalized (12/27/2008), Acute sinusitis, unspecified (02/26/2007), ADD (03/13/2010), ALLERGIC RHINITIS (02/23/2007), ANEMIA (02/05/2007), Anxiety (11/17/2011), ATTENTION DEFICIT DISORDER, HX OF (02/05/2007), Breast cancer (HCC), Deviated nasal septum (02/05/2007), Family history of breast cancer (11/20/2020), Family history of melanoma (11/20/2020), FATIGUE (02/23/2007), GERD (gastroesophageal reflux disease), HYPERLIPIDEMIA (02/23/2007), Personal history of chemotherapy, Personal history of malignant melanoma (11/20/2020), Personal history of radiation therapy, Port-A-Cath in place (12/03/2020), SWELLING MASS OR LUMP IN HEAD AND NECK (07/27/2008), and UTI (02/23/2007).  Encounter for well adult exam with abnormal findings Age and sex appropriate education and counseling updated with regular exercise and diet Referrals for preventative services - none needed Immunizations addressed - declines shingria and prevnar 20 Smoking counseling  -  none needed Evidence for depression or other mood disorder - none significant Most recent labs reviewed. I have personally reviewed and have noted: 1) the patient's medical and social history 2) The patient's current medications and supplements 3) The patient's height, weight, and BMI have been recorded in the chart   Hyperlipidemia Lab Results  Component Value Date   LDLCALC 194  (H) 08/11/2022   Uncontrolled, recent card ct score was Zero, declines statin    Attention deficit disorder Stable, to continue the adderall  Vitamin D  deficiency Last vitamin D  Lab Results  Component Value Date   VD25OH 25.99 (L) 08/11/2022   Low, to start oral replacement   Hyperglycemia Lab Results  Component Value Date   HGBA1C 5.4 08/11/2022   Stable, pt to continue current medical treatment  - diet, wt control   Right lumbar radiculopathy Worsening pain and present prolonged time without resolution, for LS Spine MRI   Urinary incontinence Also for urology referral  Vaginitis Mild to mod, for flagyl  course x 7 days, also estrogen cr asd,  to f/u any worsening symptoms or concerns   Chronic constipation Likely IBS related , for trulance  3 mg qd  Followup: Return in about 6 months (around 02/12/2024).  Rosalia Colonel, MD 08/12/2023 2:39 PM [facility]

## 2023-08-12 NOTE — Progress Notes (Signed)
 The test results show that your current treatment is OK, as the tests are stable.  Please continue the same plan.  There is no other need for change of treatment or further evaluation based on these results, at this time.  thanks

## 2023-08-12 NOTE — Assessment & Plan Note (Signed)
Also for urology referral

## 2023-08-12 NOTE — Assessment & Plan Note (Signed)
 Mild to mod, for flagyl  course x 7 days, also estrogen cr asd,  to f/u any worsening symptoms or concerns

## 2023-08-12 NOTE — Assessment & Plan Note (Signed)
Lab Results  Component Value Date   HGBA1C 5.4 08/11/2022   Stable, pt to continue current medical treatment  - diet, wt control

## 2023-08-12 NOTE — Assessment & Plan Note (Signed)
 Age and sex appropriate education and counseling updated with regular exercise and diet Referrals for preventative services - none needed Immunizations addressed - declines shingria and prevnar 20 Smoking counseling  - none needed Evidence for depression or other mood disorder - none significant Most recent labs reviewed. I have personally reviewed and have noted: 1) the patient's medical and social history 2) The patient's current medications and supplements 3) The patient's height, weight, and BMI have been recorded in the chart

## 2023-08-13 LAB — URINE CULTURE: Result:: NO GROWTH

## 2023-08-22 ENCOUNTER — Other Ambulatory Visit: Payer: Self-pay | Admitting: Internal Medicine

## 2023-08-23 ENCOUNTER — Other Ambulatory Visit: Payer: Self-pay

## 2023-08-28 ENCOUNTER — Other Ambulatory Visit

## 2023-10-05 NOTE — Progress Notes (Signed)
 Chief Complaint: No chief complaint on file.   History of Present Illness:  Robin Farley is a 58 y.o. female who is seen in consultation from Norleen Lynwood ORN, MD for evaluation of urinary incontinence.  This is mainly related to urgency.  She also has urinary frequency.  This is quite bothersome to her.  She has had symptoms like this, worsening over the past 9 years.  She has a good stream.  She feels like she empties well.  She is not treated for frequent urinary tract infections.  She also has vaginal discomfort.  Because of this, sexual activity has decreased.   Past Medical History:  Past Medical History:  Diagnosis Date   Abdominal pain, generalized 12/27/2008   Acute sinusitis, unspecified 02/26/2007   ADD 03/13/2010   ALLERGIC RHINITIS 02/23/2007   ANEMIA 02/05/2007   Anxiety 11/17/2011   ATTENTION DEFICIT DISORDER, HX OF 02/05/2007   Breast cancer (HCC)    Deviated nasal septum 02/05/2007   Family history of breast cancer 11/20/2020   Family history of melanoma 11/20/2020   FATIGUE 02/23/2007   GERD (gastroesophageal reflux disease)    HYPERLIPIDEMIA 02/23/2007   Personal history of chemotherapy    Personal history of malignant melanoma 11/20/2020   Personal history of radiation therapy    Port-A-Cath in place 12/03/2020   SWELLING MASS OR LUMP IN HEAD AND NECK 07/27/2008   UTI 02/23/2007    Past Surgical History:  Past Surgical History:  Procedure Laterality Date   BREAST IMPLANT REMOVAL Right 07/27/2022   Procedure: Removal of right breast implant, washout, drain placemen;  Surgeon: Waddell Leonce NOVAK, MD;  Location: [facility];  Service: Plastics;  Laterality: Right;   BREAST RECONSTRUCTION WITH PLACEMENT OF TISSUE EXPANDER AND FLEX HD (ACELLULAR HYDRATED DERMIS) Right 05/08/2021   Procedure: BREAST RECONSTRUCTION WITH PLACEMENT OF TISSUE EXPANDER AND FLEX HD (ACELLULAR HYDRATED DERMIS);  Surgeon: Elisabeth Craig RAMAN, MD;  Location: [facility];   Service: Plastics;  Laterality: Right;   BROW LIFT     CESAREAN SECTION     COLONOSCOPY  2016   Abran TA 5 yr recall   COSMETIC SURGERY  1995   Nasal   INCISION AND DRAINAGE OF WOUND Right 09/22/2022   Procedure: right breast washout, debridement, drain placement;  Surgeon: Waddell Leonce NOVAK, MD;  Location: [facility];  Service: Plastics;  Laterality: Right;   MASTECTOMY     MASTECTOMY W/ SENTINEL NODE BIOPSY Right 05/08/2021   Procedure: RIGHT MASTECTOMY WITH SENTINEL LYMPH NODE BIOPSY;  Surgeon: Belinda Cough, MD;  Location: [facility];  Service: General;  Laterality: Right;   MELANOMA EXCISION WITH SENTINEL LYMPH NODE BIOPSY Left 10/24/2012   Procedure: MELANOMA wide EXCISION left lateral thigh WITH SENTINEL LYMPH NODE BIOPSY left groin;  Surgeon: Morene ONEIDA Olives, MD;  Location: [facility];  Service: General;  Laterality: Left;   NASAL SEPTUM SURGERY     PORTACATH PLACEMENT Right 12/02/2020   Procedure: INSERTION PORT-A-CATH;  Surgeon: Belinda Cough, MD;  Location: [facility];  Service: General;  Laterality: Right;   RADIOACTIVE SEED GUIDED AXILLARY SENTINEL LYMPH NODE Right 05/08/2021   Procedure: RADIOACTIVE SEED GUIDED AXILLARY SENTINEL LYMPH NODE DISSECTION;  Surgeon: Belinda Cough, MD;  Location: [facility];  Service: General;  Laterality: Right;   REMOVAL OF TISSUE EXPANDER AND PLACEMENT OF IMPLANT Right 05/05/2022   Procedure: REMOVAL OF TISSUE EXPANDER AND PLACEMENT OF IMPLANT;  Surgeon: Waddell Leonce  B, MD;  Location: [facility];  Service: Plastics;  Laterality: Right;    Allergies:  Allergies  Allergen Reactions   Promethazine  Hcl     Legs twitch    Family History:  Family History  Problem Relation Age of Onset   Hypertension Mother    Breast cancer Mother 46   COPD Father    Atrial fibrillation Father    Atrial fibrillation Sister    Kidney cancer Maternal Aunt         dx 46s   Leukemia Paternal Aunt        dx after 28   Cancer Paternal Aunt        unknown type; dx after 39   Brain cancer Paternal Grandfather 53       brain tumor per pt   Melanoma Cousin        arm; no mets per pt   Alcohol abuse Other    Depression Other    Glaucoma Other    Breast cancer Other        MGM's sisters, x3, dx after 58   Colon polyps Neg Hx    Crohn's disease Neg Hx    Esophageal cancer Neg Hx    Stomach cancer Neg Hx    Rectal cancer Neg Hx     Social History:  Social History   Tobacco Use   Smoking status: Never   Smokeless tobacco: Never  Vaping Use   Vaping status: Never Used  Substance Use Topics   Alcohol use: Not Currently    Comment: Very rare occasions   Drug use: Never    Review of symptoms:  Constitutional:  Negative for unexplained weight loss, night sweats, fever, chills ENT:  Negative for nose bleeds, sinus pain, painful swallowing CV:  Negative for chest pain, shortness of breath, exercise intolerance, palpitations, loss of consciousness Resp:  Negative for cough, wheezing, shortness of breath GI:  Negative for nausea, vomiting, diarrhea, bloody stools GU:  Positives noted in HPI; otherwise negative for gross hematuria, dysuria, urinary incontinence Neuro:  Negative for seizures, poor balance, limb weakness, slurred speech Psych:  Negative for lack of energy, depression, anxiety Endocrine:  Negative for polydipsia, polyuria, symptoms of hypoglycemia (dizziness, hunger, sweating) Hematologic:  Negative for anemia, purpura, petechia, prolonged or excessive bleeding, use of anticoagulants  Allergic:  Negative for difficulty breathing or choking as a result of exposure to anything; no shellfish allergy; no allergic response (rash/itch) to materials, foods  Physical exam: LMP 04/27/2016  GENERAL APPEARANCE:  Well appearing, well developed, well nourished, NAD HEENT: Atraumatic, Normocephalic. NECK: Normal appearance LUNGS: Normal  inspiratory and expiratory excursion HEART: Regular Rate EXTREMITIES: Moves all extremities well.  Without clubbing, cyanosis, or edema. NEUROLOGIC:  Alert and oriented x 3, normal gait, CN II-XII grossly intact.  MENTAL STATUS:  Appropriate. SKIN:  Warm, dry and intact.    Results: I have reviewed referring/prior physicians records  I have reviewed urinalysis  I have reviewed prior urine cultures  Assessment: Overactive bladder with urgency and incontinence  Vaginal atrophy   Plan: -I would recommend we start with behavioral modification as she does drink a fair amount of caffeine.  She was given an overactive bladder guide sheet  -If that alone does not help, I have recommended she eventually see physical therapy for pelvic floor therapy  -I we will prescribe her estrogen cream to use 2-3 nights a week  -Return visit in 6 mos

## 2023-10-06 ENCOUNTER — Ambulatory Visit (INDEPENDENT_AMBULATORY_CARE_PROVIDER_SITE_OTHER): Admitting: Urology

## 2023-10-06 ENCOUNTER — Encounter: Payer: Self-pay | Admitting: Urology

## 2023-10-06 VITALS — BP 153/76 | HR 97 | Ht 67.0 in | Wt 170.0 lb

## 2023-10-06 DIAGNOSIS — N3281 Overactive bladder: Secondary | ICD-10-CM

## 2023-10-06 DIAGNOSIS — R32 Unspecified urinary incontinence: Secondary | ICD-10-CM | POA: Diagnosis not present

## 2023-10-06 DIAGNOSIS — N952 Postmenopausal atrophic vaginitis: Secondary | ICD-10-CM

## 2023-10-06 DIAGNOSIS — R3915 Urgency of urination: Secondary | ICD-10-CM | POA: Diagnosis not present

## 2023-10-06 LAB — URINALYSIS, ROUTINE W REFLEX MICROSCOPIC
Bilirubin, UA: NEGATIVE
Glucose, UA: NEGATIVE
Leukocytes,UA: NEGATIVE
Nitrite, UA: NEGATIVE
Protein,UA: NEGATIVE
Specific Gravity, UA: 1.025 (ref 1.005–1.030)
Urobilinogen, Ur: 0.2 mg/dL (ref 0.2–1.0)
pH, UA: 5.5 (ref 5.0–7.5)

## 2023-10-06 LAB — BLADDER SCAN AMB NON-IMAGING: Scan Result: 13

## 2023-10-06 LAB — MICROSCOPIC EXAMINATION

## 2023-10-06 MED ORDER — ESTRADIOL 0.1 MG/GM VA CREA: TOPICAL_CREAM | VAGINAL | 3 refills | Status: AC

## 2023-10-25 ENCOUNTER — Ambulatory Visit: Payer: Managed Care, Other (non HMO) | Attending: Student

## 2023-10-25 DIAGNOSIS — Z483 Aftercare following surgery for neoplasm: Secondary | ICD-10-CM | POA: Insufficient documentation

## 2023-11-01 ENCOUNTER — Inpatient Hospital Stay: Payer: Managed Care, Other (non HMO) | Attending: Hematology and Oncology | Admitting: Hematology and Oncology

## 2023-11-01 ENCOUNTER — Other Ambulatory Visit: Payer: Self-pay | Admitting: *Deleted

## 2023-11-01 VITALS — BP 154/86 | HR 89 | Temp 98.6°F | Resp 18 | Wt 170.2 lb

## 2023-11-01 DIAGNOSIS — Z1732 Human epidermal growth factor receptor 2 negative status: Secondary | ICD-10-CM | POA: Diagnosis not present

## 2023-11-01 DIAGNOSIS — Z9221 Personal history of antineoplastic chemotherapy: Secondary | ICD-10-CM | POA: Diagnosis not present

## 2023-11-01 DIAGNOSIS — F988 Other specified behavioral and emotional disorders with onset usually occurring in childhood and adolescence: Secondary | ICD-10-CM

## 2023-11-01 DIAGNOSIS — Z1722 Progesterone receptor negative status: Secondary | ICD-10-CM | POA: Diagnosis not present

## 2023-11-01 DIAGNOSIS — C50411 Malignant neoplasm of upper-outer quadrant of right female breast: Secondary | ICD-10-CM | POA: Diagnosis present

## 2023-11-01 DIAGNOSIS — Z9011 Acquired absence of right breast and nipple: Secondary | ICD-10-CM | POA: Insufficient documentation

## 2023-11-01 DIAGNOSIS — Z171 Estrogen receptor negative status [ER-]: Secondary | ICD-10-CM | POA: Insufficient documentation

## 2023-11-01 DIAGNOSIS — Z923 Personal history of irradiation: Secondary | ICD-10-CM | POA: Insufficient documentation

## 2023-11-01 NOTE — Progress Notes (Signed)
 BRIEF ONCOLOGIC HISTORY:  Oncology History  Malignant neoplasm of upper-outer quadrant of right breast in female, estrogen receptor negative (HCC)  11/12/2020 Initial Diagnosis   Malignant neoplasm of upper-outer quadrant of right breast in female, estrogen receptor positive (HCC)   11/20/2020 Cancer Staging   Staging form: Breast, AJCC 8th Edition - Clinical: Stage IIB (cT1c, cN1, cM0, G3, ER-, PR-, HER2-) - Signed by Layla Sandria BROCKS, MD on 11/20/2020 Histologic grading system: 3 grade system   12/03/2020 - 04/10/2021 Chemotherapy   Patient is on Treatment Plan : BREAST Pembrolizumab  + Carboplatin  D1 + Paclitaxel  D1,8,15 q21d X 4 cycles / Pembrolizumab  + AC q21d x 4 cycles     12/03/2020 Genetic Testing   Negative hereditary cancer genetic testing: no pathogenic variants detected in [facility] CancerNext-Expanded +RNAinsight Panel.  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  The report date is December 03, 2020.   The CancerNext-Expanded gene panel offered by [facility] and includes sequencing, rearrangement, and RNA analysis for the following 77 genes: AIP, ALK, APC, ATM, AXIN2, BAP1, BARD1, BLM, BMPR1A, BRCA1, BRCA2, BRIP1, CDC73, CDH1, CDK4, CDKN1B, CDKN2A, CHEK2, CTNNA1, DICER1, FANCC, FH, FLCN, GALNT12, KIF1B, LZTR1, MAX, MEN1, MET, MLH1, MSH2, MSH3, MSH6, MUTYH, NBN, NF1, NF2, NTHL1, PALB2, PHOX2B, PMS2, POT1, PRKAR1A, PTCH1, PTEN, RAD51C, RAD51D, RB1, RECQL, RET, SDHA, SDHAF2, SDHB, SDHC, SDHD, SMAD4, SMARCA4, SMARCB1, SMARCE1, STK11, SUFU, TMEM127, TP53, TSC1, TSC2, VHL and XRCC2 (sequencing and deletion/duplication); EGFR, EGLN1, HOXB13, KIT, MITF, PDGFRA, POLD1, and POLE (sequencing only); EPCAM and GREM1 (deletion/duplication only).    04/29/2021 - 11/04/2021 Chemotherapy   Patient is on Treatment Plan : BREAST Pembrolizumab  (200) q21d x 27 weeks     05/08/2021 Cancer Staging   Staging form: Breast, AJCC 8th Edition - Pathologic stage from 05/08/2021: No  Stage Recommended (ypT2, pN1a, cM0, G3, ER-, PR-, HER2-) - Signed by Crawford Morna Pickle, NP on 05/21/2021 Stage prefix: Post-therapy Histologic grading system: 3 grade system   05/08/2021 Surgery   She had right mastectomy and targeted right axillary dissection on 05/08/2021.  Pathology showed 2.3 x 1.7 x 1.0 cm invasive ductal carcinoma, grade 3 of 3, treatment effect was robust in the breast and minimal in lymph nodes.  All surgical margins negative for invasive carcinoma, out of 4 examined the lymph nodes, 2 with micrometastasis and 0 with micrometastasis or isolated tumor cells.  Largest metastatic deposit in the lymph node was 4 mm and extranodal extension was present.   06/23/2021 - 07/31/2021 Radiation Therapy   06/23/2021 through 07/31/2021 Site Technique Total Dose (Gy) Dose per Fx (Gy) Completed Fx Beam Energies  Chest Wall, Right: CW_R_IMN 3D 50.4/50.4 1.8 28/28 6X  Chest Wall, Right: CW_R_PAB_SCV 3D 50.4/50.4 1.8 28/28 6X, 10X       INTERVAL HISTORY:   Ms. Batres is here for follow up.  Discussed the use of AI scribe software for clinical note transcription with the patient, who gave verbal consent to proceed.  History of Present Illness Robin Farley is a 58 year old female who presents for follow up with history of breast cancer with cognitive difficulties and digestive issues.  She experiences ongoing cognitive difficulties, particularly with short-term memory, which have remained stable over time without improvement or worsening.  She has been prescribed Trulance  (plecanatide ) for digestive issues but has not been taking it regularly due to experiencing diarrhea as a side effect. She is unsure of the exact dosing schedule but thinks it should be taken daily.  Her  recent bone density test in April was discussed during the visit, showed osteopenia.  Rest of the pertinent 10 point ROS reviewed and neg.  REVIEW OF SYSTEMS:  Review of Systems  Constitutional:  Negative  for appetite change, chills, fatigue, fever and unexpected weight change.  HENT:   Negative for hearing loss, lump/mass and trouble swallowing.   Eyes:  Negative for eye problems and icterus.  Respiratory:  Negative for chest tightness, cough and shortness of breath.   Cardiovascular:  Negative for chest pain, leg swelling and palpitations.  Gastrointestinal:  Negative for abdominal distention, abdominal pain, constipation, diarrhea, nausea and vomiting.  Endocrine: Negative for hot flashes.  Genitourinary:  Negative for difficulty urinating.   Musculoskeletal:  Negative for arthralgias and back pain.  Skin:  Negative for itching and rash.  Neurological:  Negative for dizziness, extremity weakness, headaches and numbness.  Hematological:  Negative for adenopathy. Does not bruise/bleed easily.  Psychiatric/Behavioral:  Negative for depression. The patient is not nervous/anxious.   Breast: Denies any new nodularity, masses, tenderness, nipple changes, or nipple discharge.    ONCOLOGY TREATMENT TEAM:  1. Surgeon:  Dr. Belinda at [facility] 2. Medical Oncologist: Dr. Loretha  3. Radiation Oncologist: Dr. Izell    PAST MEDICAL/SURGICAL HISTORY:  Past Medical History:  Diagnosis Date   Abdominal pain, generalized 12/27/2008   Acute sinusitis, unspecified 02/26/2007   ADD 03/13/2010   ALLERGIC RHINITIS 02/23/2007   ANEMIA 02/05/2007   Anxiety 11/17/2011   ATTENTION DEFICIT DISORDER, HX OF 02/05/2007   Breast cancer (HCC)    Deviated nasal septum 02/05/2007   Family history of breast cancer 11/20/2020   Family history of melanoma 11/20/2020   FATIGUE 02/23/2007   GERD (gastroesophageal reflux disease)    HYPERLIPIDEMIA 02/23/2007   Personal history of chemotherapy    Personal history of malignant melanoma 11/20/2020   Personal history of radiation therapy    Port-A-Cath in place 12/03/2020   SWELLING MASS OR LUMP IN HEAD AND NECK 07/27/2008   UTI 02/23/2007   Past  Surgical History:  Procedure Laterality Date   BREAST IMPLANT REMOVAL Right 07/27/2022   Procedure: Removal of right breast implant, washout, drain placemen;  Surgeon: Waddell Leonce NOVAK, MD;  Location: [facility];  Service: Plastics;  Laterality: Right;   BREAST RECONSTRUCTION WITH PLACEMENT OF TISSUE EXPANDER AND FLEX HD (ACELLULAR HYDRATED DERMIS) Right 05/08/2021   Procedure: BREAST RECONSTRUCTION WITH PLACEMENT OF TISSUE EXPANDER AND FLEX HD (ACELLULAR HYDRATED DERMIS);  Surgeon: Elisabeth Craig RAMAN, MD;  Location: [facility];  Service: Plastics;  Laterality: Right;   BROW LIFT     CESAREAN SECTION     COLONOSCOPY  2016   Abran TA 5 yr recall   COSMETIC SURGERY  1995   Nasal   INCISION AND DRAINAGE OF WOUND Right 09/22/2022   Procedure: right breast washout, debridement, drain placement;  Surgeon: Waddell Leonce NOVAK, MD;  Location: [facility];  Service: Plastics;  Laterality: Right;   MASTECTOMY     MASTECTOMY W/ SENTINEL NODE BIOPSY Right 05/08/2021   Procedure: RIGHT MASTECTOMY WITH SENTINEL LYMPH NODE BIOPSY;  Surgeon: Belinda Cough, MD;  Location: [facility];  Service: General;  Laterality: Right;   MELANOMA EXCISION WITH SENTINEL LYMPH NODE BIOPSY Left 10/24/2012   Procedure: MELANOMA wide EXCISION left lateral thigh WITH SENTINEL LYMPH NODE BIOPSY left groin;  Surgeon: Morene ONEIDA Olives, MD;  Location: [facility];  Service: General;  Laterality: Left;   NASAL  SEPTUM SURGERY     PORTACATH PLACEMENT Right 12/02/2020   Procedure: INSERTION PORT-A-CATH;  Surgeon: Belinda Cough, MD;  Location: [facility];  Service: General;  Laterality: Right;   RADIOACTIVE SEED GUIDED AXILLARY SENTINEL LYMPH NODE Right 05/08/2021   Procedure: RADIOACTIVE SEED GUIDED AXILLARY SENTINEL LYMPH NODE DISSECTION;  Surgeon: Belinda Cough, MD;  Location: [facility];  Service: General;  Laterality: Right;   REMOVAL OF TISSUE  EXPANDER AND PLACEMENT OF IMPLANT Right 05/05/2022   Procedure: REMOVAL OF TISSUE EXPANDER AND PLACEMENT OF IMPLANT;  Surgeon: Waddell Leonce NOVAK, MD;  Location: [facility];  Service: Plastics;  Laterality: Right;     ALLERGIES:  Allergies  Allergen Reactions   Promethazine  Hcl     Legs twitch     CURRENT MEDICATIONS:  Outpatient Encounter Medications as of 11/01/2023  Medication Sig   amphetamine -dextroamphetamine  (ADDERALL) 20 MG tablet Take 1.5 tabs by mouth twice per day   Berberine Chloride (BERBERINE HCI PO) Take by mouth.   cyclobenzaprine  (FLEXERIL ) 5 MG tablet Take 1 tablet (5 mg total) by mouth 3 (three) times daily as needed.   estradiol  (ESTRACE ) 0.1 MG/GM vaginal cream Apply a pea-sized amount to vaginal opening using index finger 2-3 nights/week   melatonin 5 MG TABS Take 5 mg by mouth at bedtime as needed (sleep).   meloxicam  (MOBIC ) 15 MG tablet TAKE 1 TABLET BY MOUTH EVERY DAY AS NEEDED FOR PAIN   metroNIDAZOLE  (FLAGYL ) 500 MG tablet Take 1 tablet (500 mg total) by mouth 3 (three) times daily.   Na Sulfate-K Sulfate-Mg Sulfate concentrate (SUPREP) 17.5-3.13-1.6 GM/177ML SOLN as directed.   OVER THE COUNTER MEDICATION Bosmed QD   OVER THE COUNTER MEDICATION ANDEROGRAPHIS QD   Plecanatide  (TRULANCE ) 3 MG TABS 1 tab by mouth once daily   TURMERIC CURCUMIN PO Take 750 mg by mouth daily.   No facility-administered encounter medications on file as of 11/01/2023.     ONCOLOGIC FAMILY HISTORY:  Family History  Problem Relation Age of Onset   Hypertension Mother    Breast cancer Mother 74   COPD Father    Atrial fibrillation Father    Atrial fibrillation Sister    Kidney cancer Maternal Aunt        dx 44s   Leukemia Paternal Aunt        dx after 83   Cancer Paternal Aunt        unknown type; dx after 56   Brain cancer Paternal Grandfather 82       brain tumor per pt   Melanoma Cousin        arm; no mets per pt   Alcohol abuse Other    Depression  Other    Glaucoma Other    Breast cancer Other        MGM's sisters, x3, dx after 24   Colon polyps Neg Hx    Crohn's disease Neg Hx    Esophageal cancer Neg Hx    Stomach cancer Neg Hx    Rectal cancer Neg Hx      SOCIAL HISTORY:  Social History   Socioeconomic History   Marital status: Married    Spouse name: Geologist, engineering   Number of children: 2   Years of education: Not on file   Highest education level: Master's degree (e.g., MA, MS, MEng, MEd, MSW, MBA)  Occupational History   Occupation: master's in fine arts [employer]    Employer: [employer]  Tobacco Use  Smoking status: Never   Smokeless tobacco: Never  Vaping Use   Vaping status: Never Used  Substance and Sexual Activity   Alcohol use: Not Currently    Comment: Very rare occasions   Drug use: Never   Sexual activity: Yes    Birth control/protection: None, Post-menopausal  Other Topics Concern   Not on file  Social History Narrative   Not on file   Social Drivers of Health   Financial Resource Strain: Low Risk  (02/20/2023)   Overall Financial Resource Strain (CARDIA)    Difficulty of Paying Living Expenses: Not hard at all  Food Insecurity: No Food Insecurity (02/20/2023)   Hunger Vital Sign    Worried About Running Out of Food in the Last Year: Never true    Ran Out of Food in the Last Year: Never true  Transportation Needs: No Transportation Needs (02/20/2023)   PRAPARE - Administrator, Civil Service (Medical): No    Lack of Transportation (Non-Medical): No  Physical Activity: Insufficiently Active (02/20/2023)   Exercise Vital Sign    Days of Exercise per Week: 2 days    Minutes of Exercise per Session: 30 min  Stress: Stress Concern Present (02/20/2023)   [employer] of Occupational Health - Occupational Stress Questionnaire    Feeling of Stress : To some extent  Social Connections: Socially Integrated (02/20/2023)   Social Connection and Isolation Panel    Frequency of  Communication with Friends and Family: More than three times a week    Frequency of Social Gatherings with Friends and Family: More than three times a week    Attends Religious Services: More than 4 times per year    Active Member of [employer] or Organizations: Yes    Attends Banker Meetings: More than 4 times per year    Marital Status: Married  Catering manager Violence: Not on file     OBSERVATIONS/OBJECTIVE:  LMP 04/27/2016   GENERAL: Patient is a well appearing female in no acute distress  NODES:  No cervical, supraclavicular, or axillary lymphadenopathy palpated.  BREAST EXAM: Status post right breast mastectomy.  No palpable concerns. No regional adenopathy LUNGS:  Clear to auscultation bilaterally.  No wheezes or rhonchi. HEART:  Regular rate and rhythm. No murmur appreciated. ABDOMEN:  Soft, nontender.  Positive, normoactive bowel sounds. No organomegaly palpated. EXTREMITIES:  No peripheral edema.   SKIN:  Clear with no obvious rashes or skin changes. No nail dyscrasia. NEURO:  Nonfocal. Well oriented.  Appropriate affect. No change.  LABORATORY DATA:  None for this visit.  DIAGNOSTIC IMAGING:  None for this visit.      ASSESSMENT AND PLAN:   ASSESSMENT: 58 y.o. [place] woman with T1c N1, stage IIB invasive ductal carcinoma, grade 3, triple negative, with an MIB-1 of 30%              (1) genetics test 12/03/2020 through the [facility] CancerNext-Expanded +RNAinsight Panel found no deleterious mutations in AIP, ALK, APC, ATM, AXIN2, BAP1, BARD1, BLM, BMPR1A, BRCA1, BRCA2, BRIP1, CDC73, CDH1, CDK4, CDKN1B, CDKN2A, CHEK2, CTNNA1, DICER1, FANCC, FH, FLCN, GALNT12, KIF1B, LZTR1, MAX, MEN1, MET, MLH1, MSH2, MSH3, MSH6, MUTYH, NBN, NF1, NF2, NTHL1, PALB2, PHOX2B, PMS2, POT1, PRKAR1A, PTCH1, PTEN, RAD51C, RAD51D, RB1, RECQL, RET, SDHA, SDHAF2, SDHB, SDHC, SDHD, SMAD4, SMARCA4, SMARCB1, SMARCE1, STK11, SUFU, TMEM127, TP53, TSC1, TSC2, VHL and XRCC2 (sequencing and  deletion/duplication); EGFR, EGLN1, HOXB13, KIT, MITF, PDGFRA, POLD1, and POLE (sequencing only); EPCAM and GREM1 (deletion/duplication only).     (  2) neoadjuvant chemotherapy consisting of carboplatin  and paclitaxel  with pembrolizumab  every 21 days started 12/03/2020, completed 01/21/2021 (3 cycles) followed by doxorubicin  and cyclophosphamide  with pembrolizumab  every 21 days x 4 starting 02/04/2021             (a) echo 11/26/2020 shows an ejection fraction of 64% (b) fourth carboplatin /paclitaxel  cycle omitted secondary to neuropathy She completed Adriamycin  and cyclophosphamide  and 4 cycles of immunotherapy.   She underwent right mastectomy and targeted lymph node dissection, final pathology showing tumor measuring 2.3 cm in largest dimension, grade 3, negative margins with excellent response in the breast however minimal response in the lymph node, 2 out of 4 lymph nodes positive for involvement, extranodal extension present, largest metastasis is 4 mm.  She completed adj immunotherapy.  PLAN  Assessment and Plan Assessment & Plan Melanoma Discussed ctDNA testing for breast cancer recurrence. Test is not FDA approved. Involves biannual mobile blood draw to detect early recurrences.  - Provide pamphlet for ctDNA testing. - Arrange mobile blood draw for ctDNA testing. - Monitor ctDNA results and trend if detectable. - Schedule scans if ctDNA is up trending or if new symptoms arise  Cognitive impairment Persistent cognitive impairment with short-term memory difficulty. No change in condition. Discussed potential referral to [facility] for cognitive rehabilitation. - Investigate [facility] cognitive rehabilitation program for potential referral.  Osteopenia Osteopenia confirmed by April bone density test. Condition between normal and osteoporosis. Emphasized weight-bearing exercises and calcium  intake. No medication needed currently. - Encourage weight-bearing exercises. - Ensure adequate calcium   intake.  Time spent: 30 min  *Total Encounter Time as defined by the Centers for Medicare and Medicaid Services includes, in addition to the face-to-face time of a patient visit (documented in the note above) non-face-to-face time: obtaining and reviewing outside history, ordering and reviewing medications, tests or procedures, care coordination (communications with other health care professionals or caregivers) and documentation in the medical record.

## 2023-11-01 NOTE — Progress Notes (Signed)
 Faxed cognitive rehab referral to [facility] Dept of rehabilitation Services with pt demographics and insurance information. Faxed receipt was successful [phone]

## 2023-11-10 ENCOUNTER — Ambulatory Visit
Admission: RE | Admit: 2023-11-10 | Discharge: 2023-11-10 | Disposition: A | Discharge status: Home / Self Care | Source: Ambulatory Visit | Attending: Hematology and Oncology | Admitting: Hematology and Oncology

## 2023-11-10 DIAGNOSIS — C50411 Malignant neoplasm of upper-outer quadrant of right female breast: Secondary | ICD-10-CM

## 2023-11-12 ENCOUNTER — Other Ambulatory Visit: Payer: Self-pay

## 2023-11-19 ENCOUNTER — Telehealth: Payer: Self-pay

## 2023-11-19 NOTE — Telephone Encounter (Signed)
 Phone call to [employer] with negative results of guardant reveal

## 2023-11-23 ENCOUNTER — Encounter: Payer: Self-pay | Admitting: Hematology and Oncology

## 2024-01-19 IMAGING — MG MM BREAST LOCALIZATION CLIP
2 series · 3 of 6 positions shown · non-contrast
Comparison: Previous exam(s).

CLINICAL DATA: Status post radioactive seed placement in a right
axillary lymph node.

EXAM:
3D DIAGNOSTIC RIGHT MAMMOGRAM POST ULTRASOUND BIOPSY

[R MLO synth-2D]
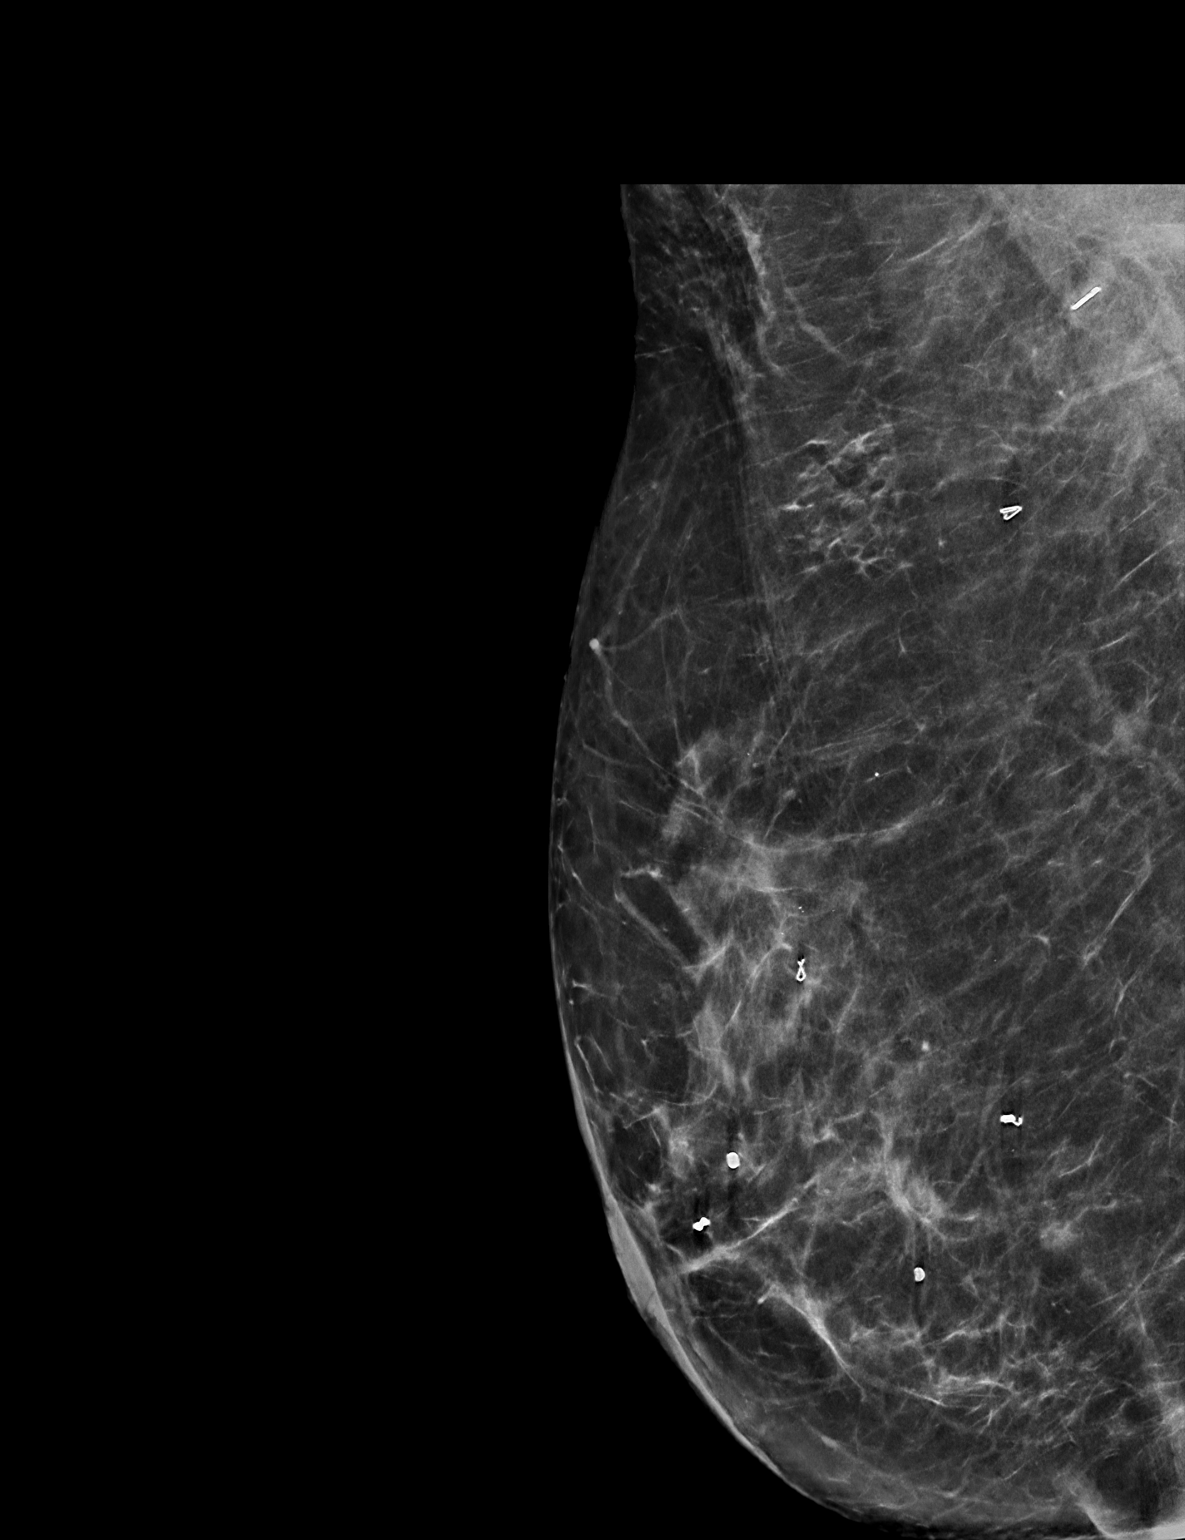

[R MLO tomo · 2 of 70 frames shown]
[frame 23/70]
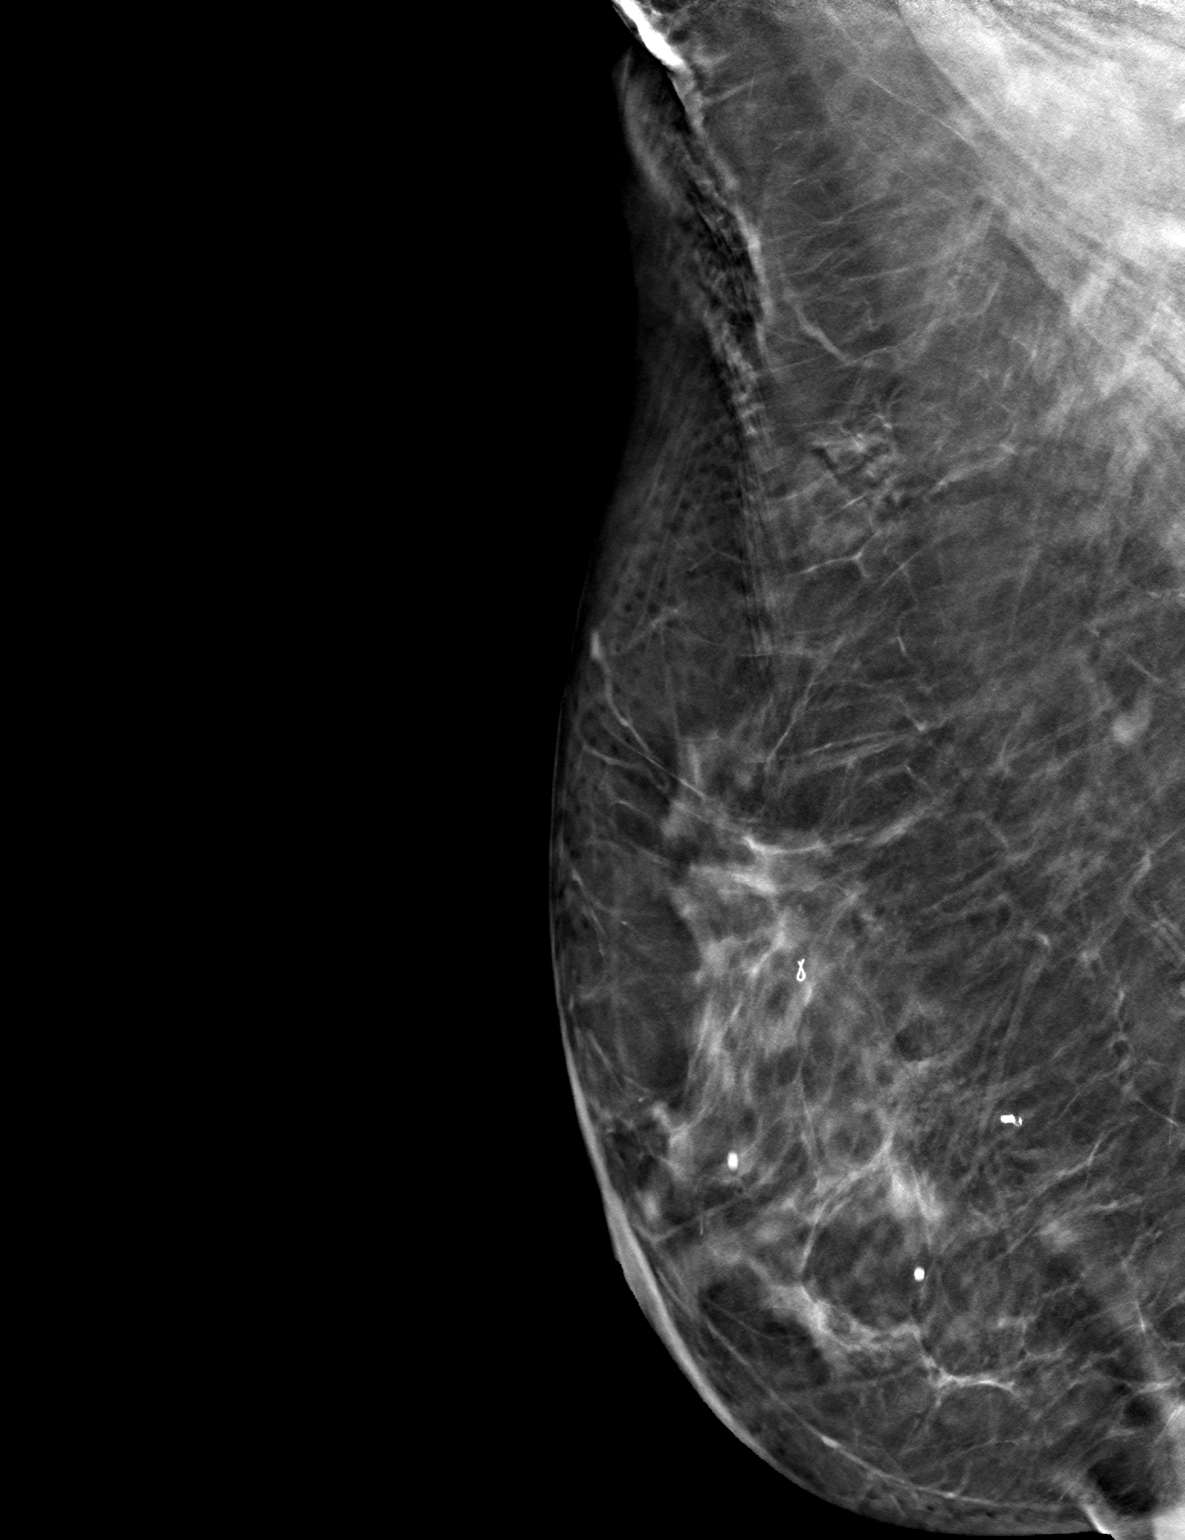
[frame 35/70]
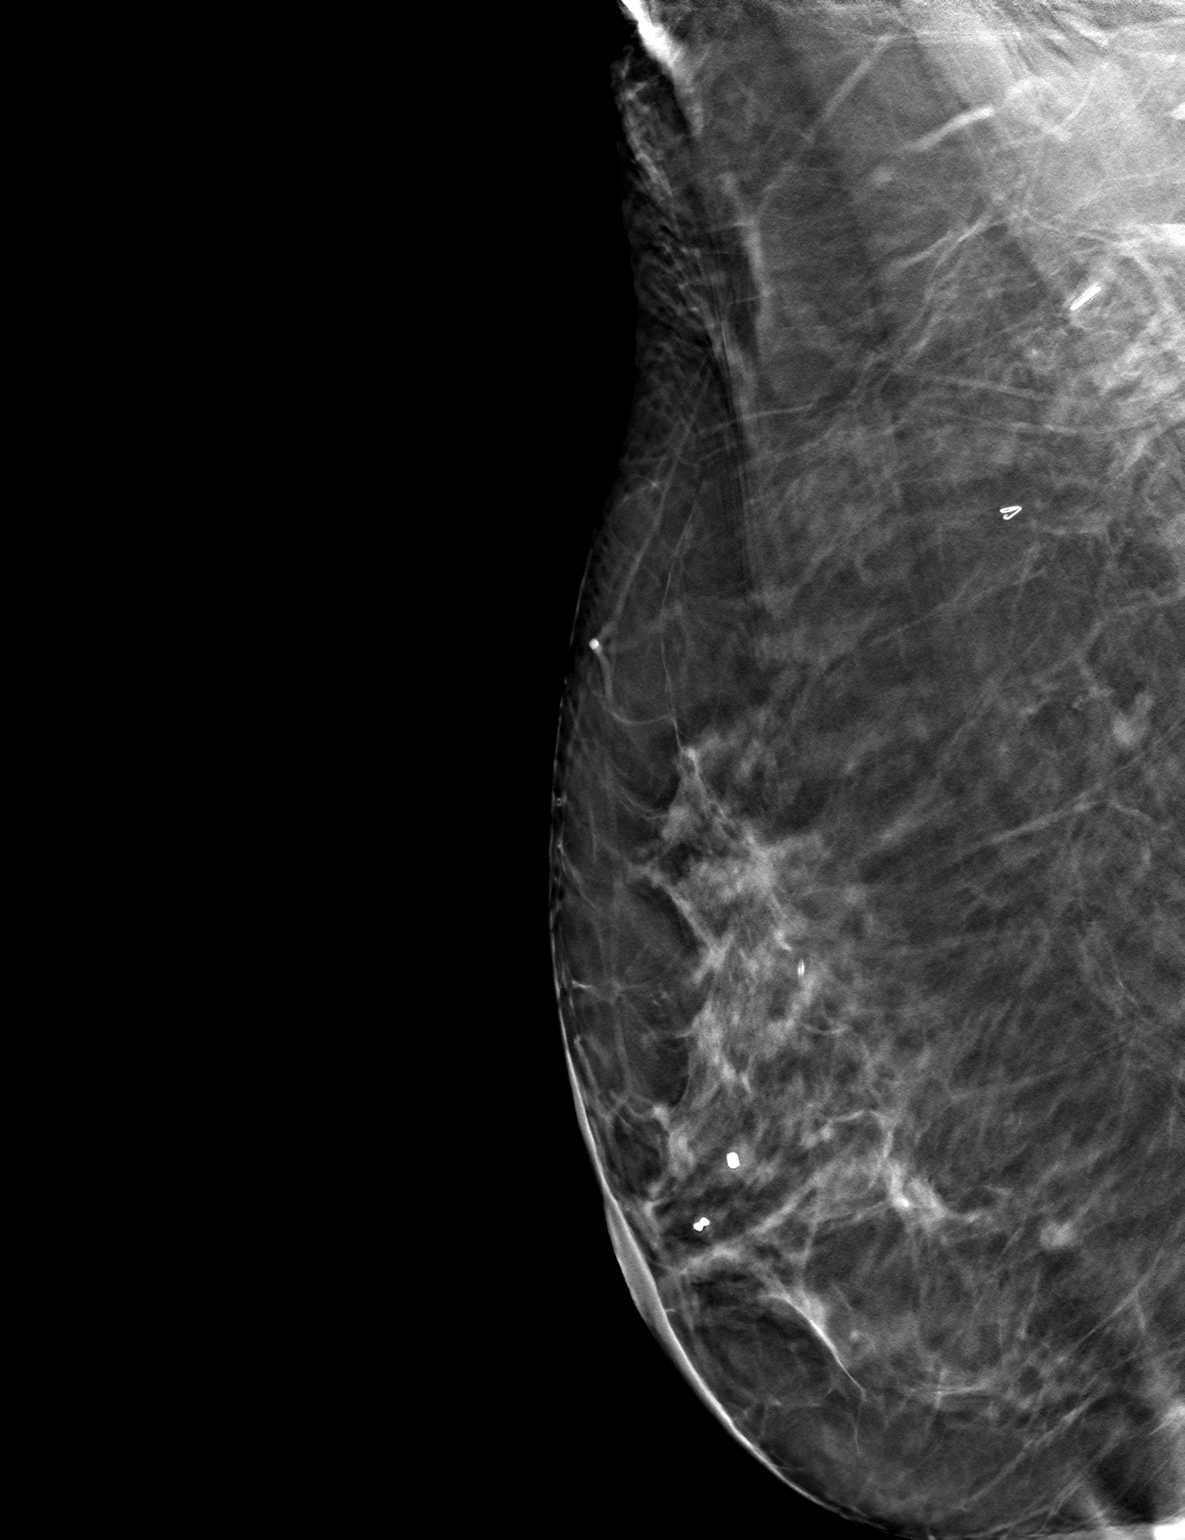

[3 of 6 positions shown; findings below may reference images not displayed]

FINDINGS: 3D Mammographic images were obtained following ultrasound placement
of a radioactive seed in the right axilla.
IMPRESSION: Post radioactive seed placement mammogram demonstrates the
radioactive seed is located approximately 3 cm superior and
posterior to the biopsied axillary lymph node. The heart shaped clip
could not be localized with a radioactive seed secondary to it not
being seen sonographically and not being able to do mammographically
because of its location. Findings were discussed with Dr. Malinauskai.

Final Assessment: Post Procedure Mammograms for Marker Placement

## 2024-01-19 IMAGING — US US NEEDLE LOCALIZATION*R*
1 series · 6 of 6 positions shown · non-contrast
Comparison: Previous exam(s).

CLINICAL DATA: Radioactive seed localization of a metastatic
axillary lymph node requested prior to surgery.

EXAM:
ULTRASOUND GUIDED RADIOACTIVE SEED LOCALIZATION OF THE RIGHT AXILLA

[Series 1: us needle localization*right* · 0.06mm/px · 6 of 6 slices shown]
[im 1/6]
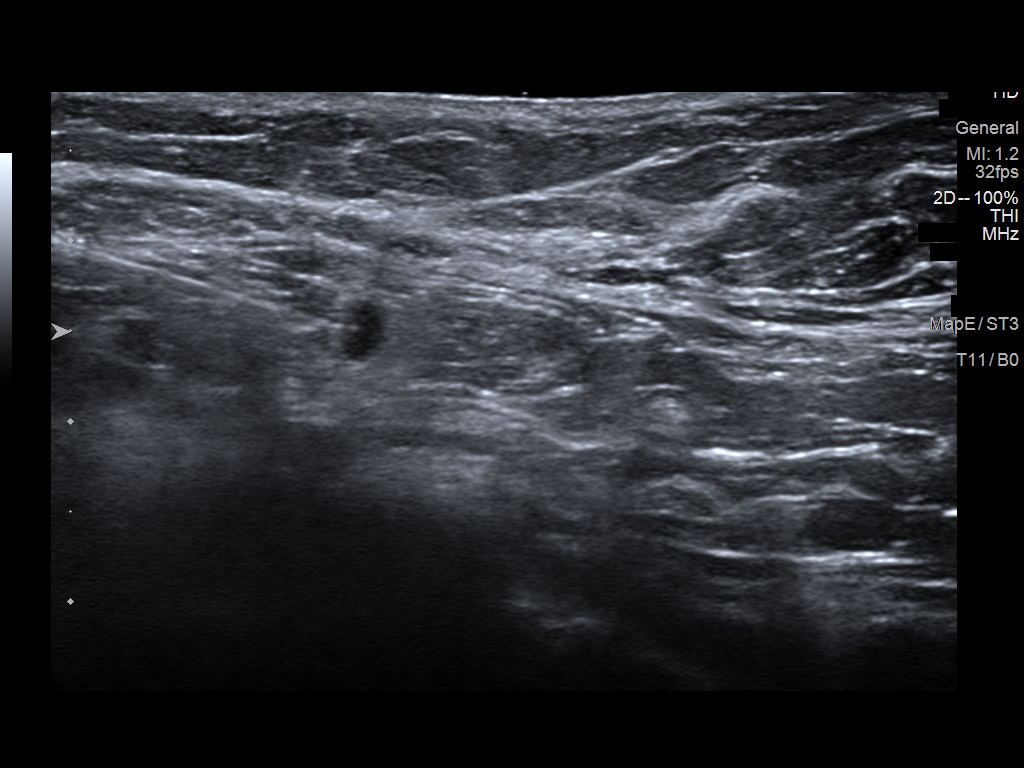
[im 2/6]
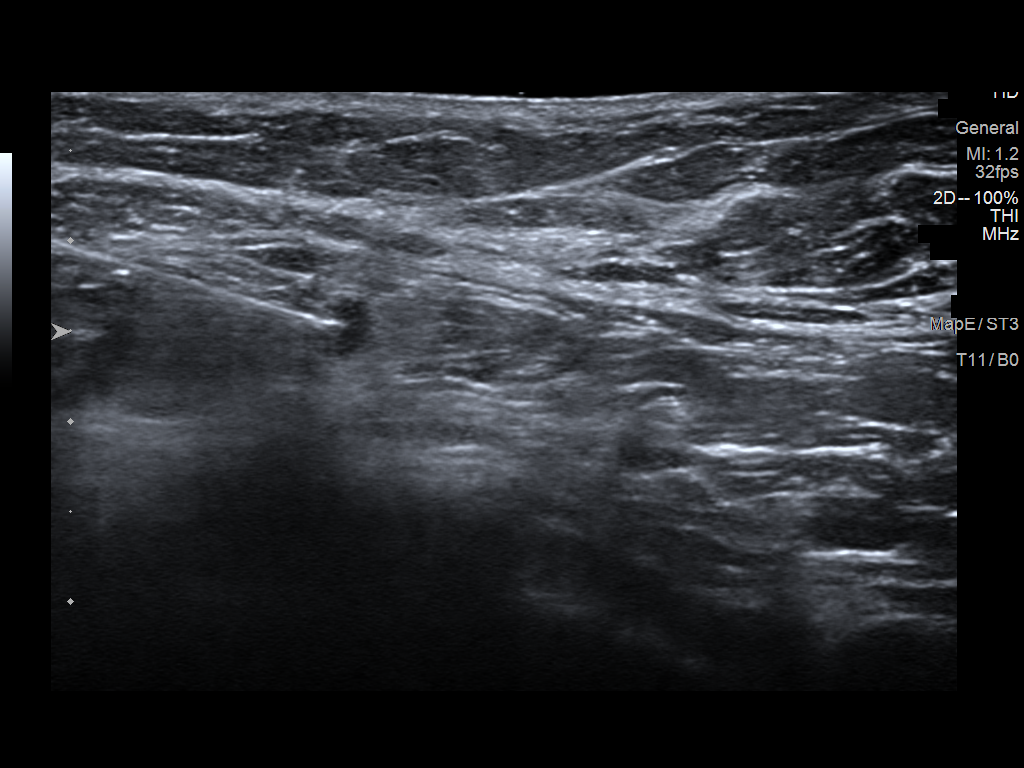
[im 3/6]
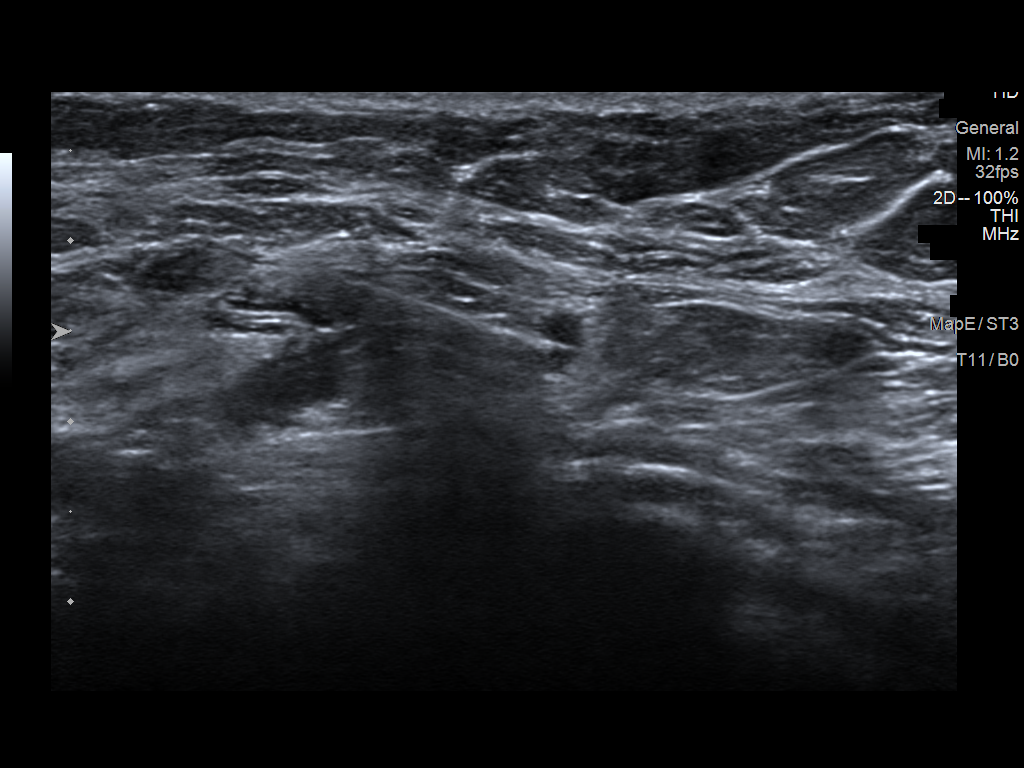
[im 4/6]
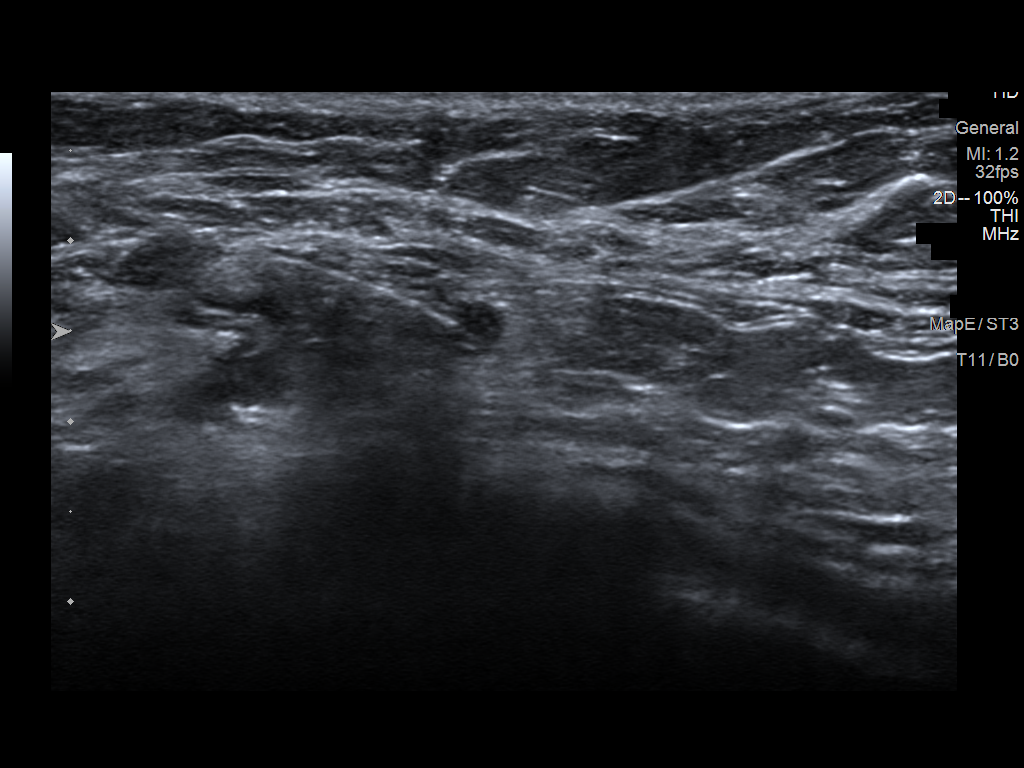
[im 5/6]
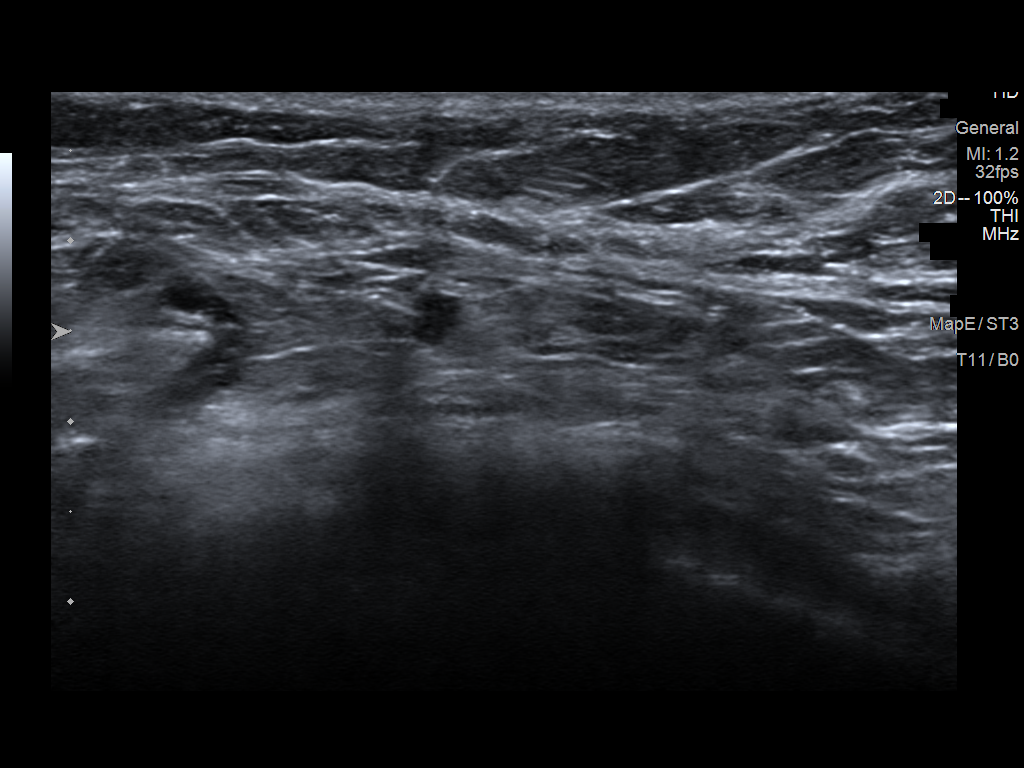
[im 6/6]
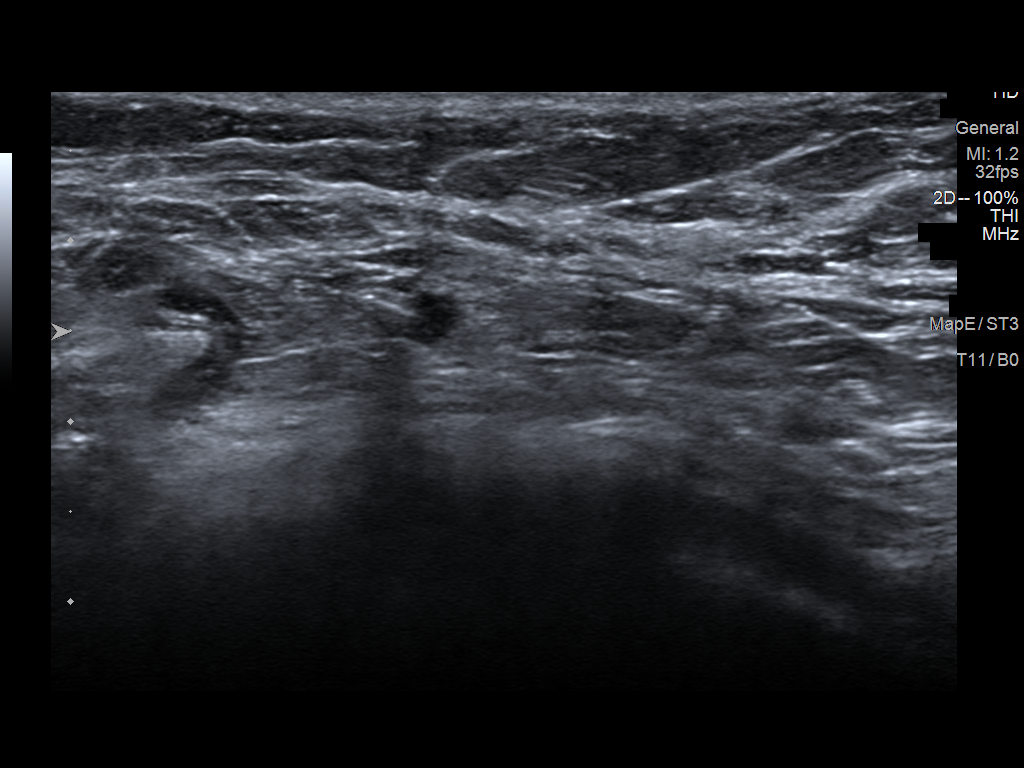

[6 of 6 positions shown; findings below may reference images not displayed]



The usual time-out protocol was performed immediately prior to the
procedure.

Using ultrasound guidance, sterile technique, 1% lidocaine and an
[id number] radioactive seed, a lymph node in the right axilla was
localized using an inferior to superior approach. The follow-up
mammogram images confirm the seed in the expected location and were
marked for Dr. Felner.

Follow-up survey of the patient confirms presence of the radioactive
seed.

Order number of [id number] seed:  [id number].

Total activity:  0.247 millicuries reference Date: 04/28/2021

The patient tolerated the procedure well and was released from the
[REDACTED]. She was given instructions regarding seed removal.
IMPRESSION: Radioactive seed localization of the right axilla. Post seed
placement mammogram demonstrates the radioactive seed is located 3
cm superior and posterior to the biopsied axillary lymph node.
Findings were discussed with Dr. Felner.

## 2024-01-20 IMAGING — DX MM BREAST SURGICAL SPECIMEN
1 series · 2 of 2 positions shown · non-contrast
Comparison: Previous exam(s).

CLINICAL DATA: Specimen radiograph status post targeted right
axillary lymph node dissection.

EXAM:
SPECIMEN RADIOGRAPH OF THE RIGHT BREAST

[Series 2: specimen digital x-ray, derived · right · 0.07mm/px · 2 of 2 slices shown]
[im 1/2]
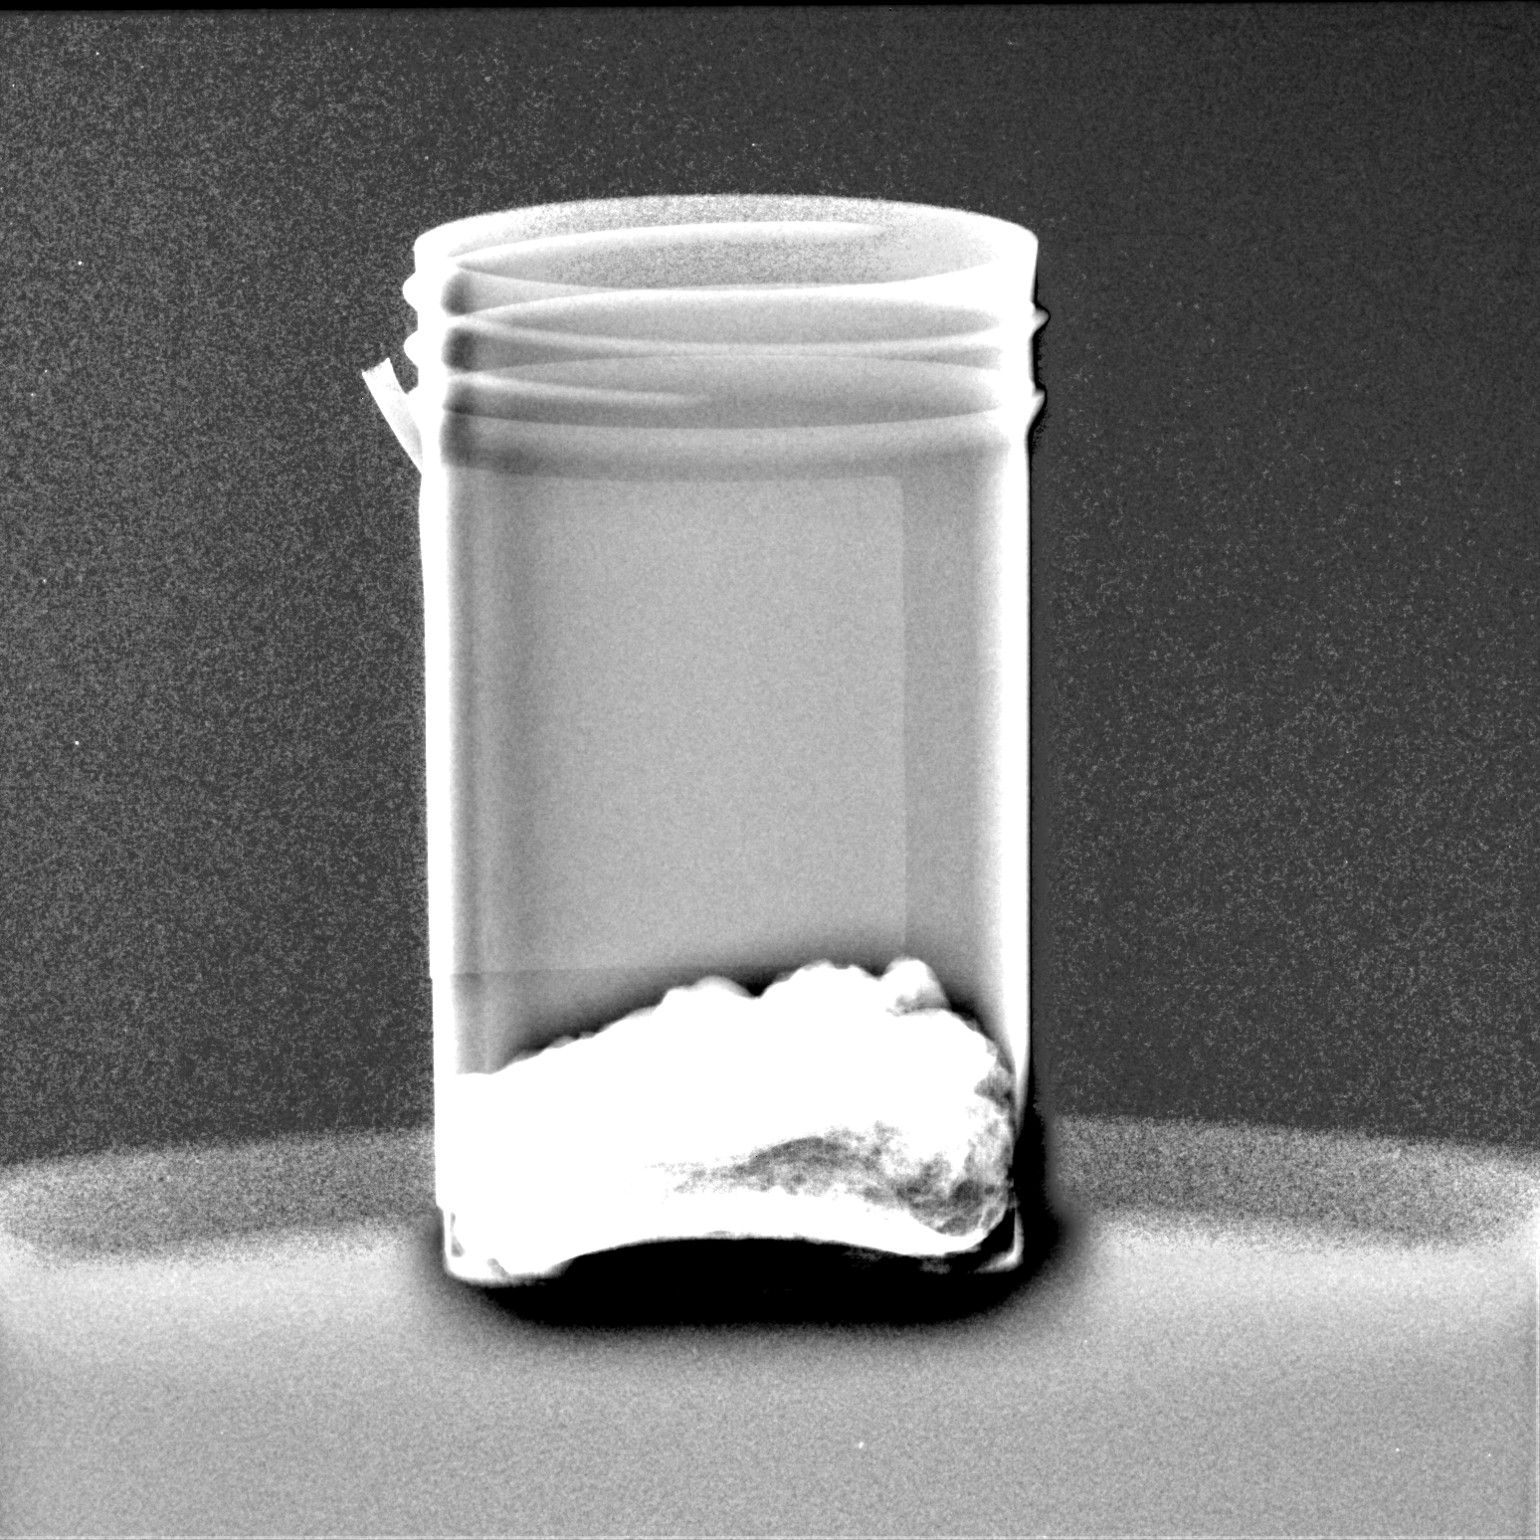
[im 2/2]
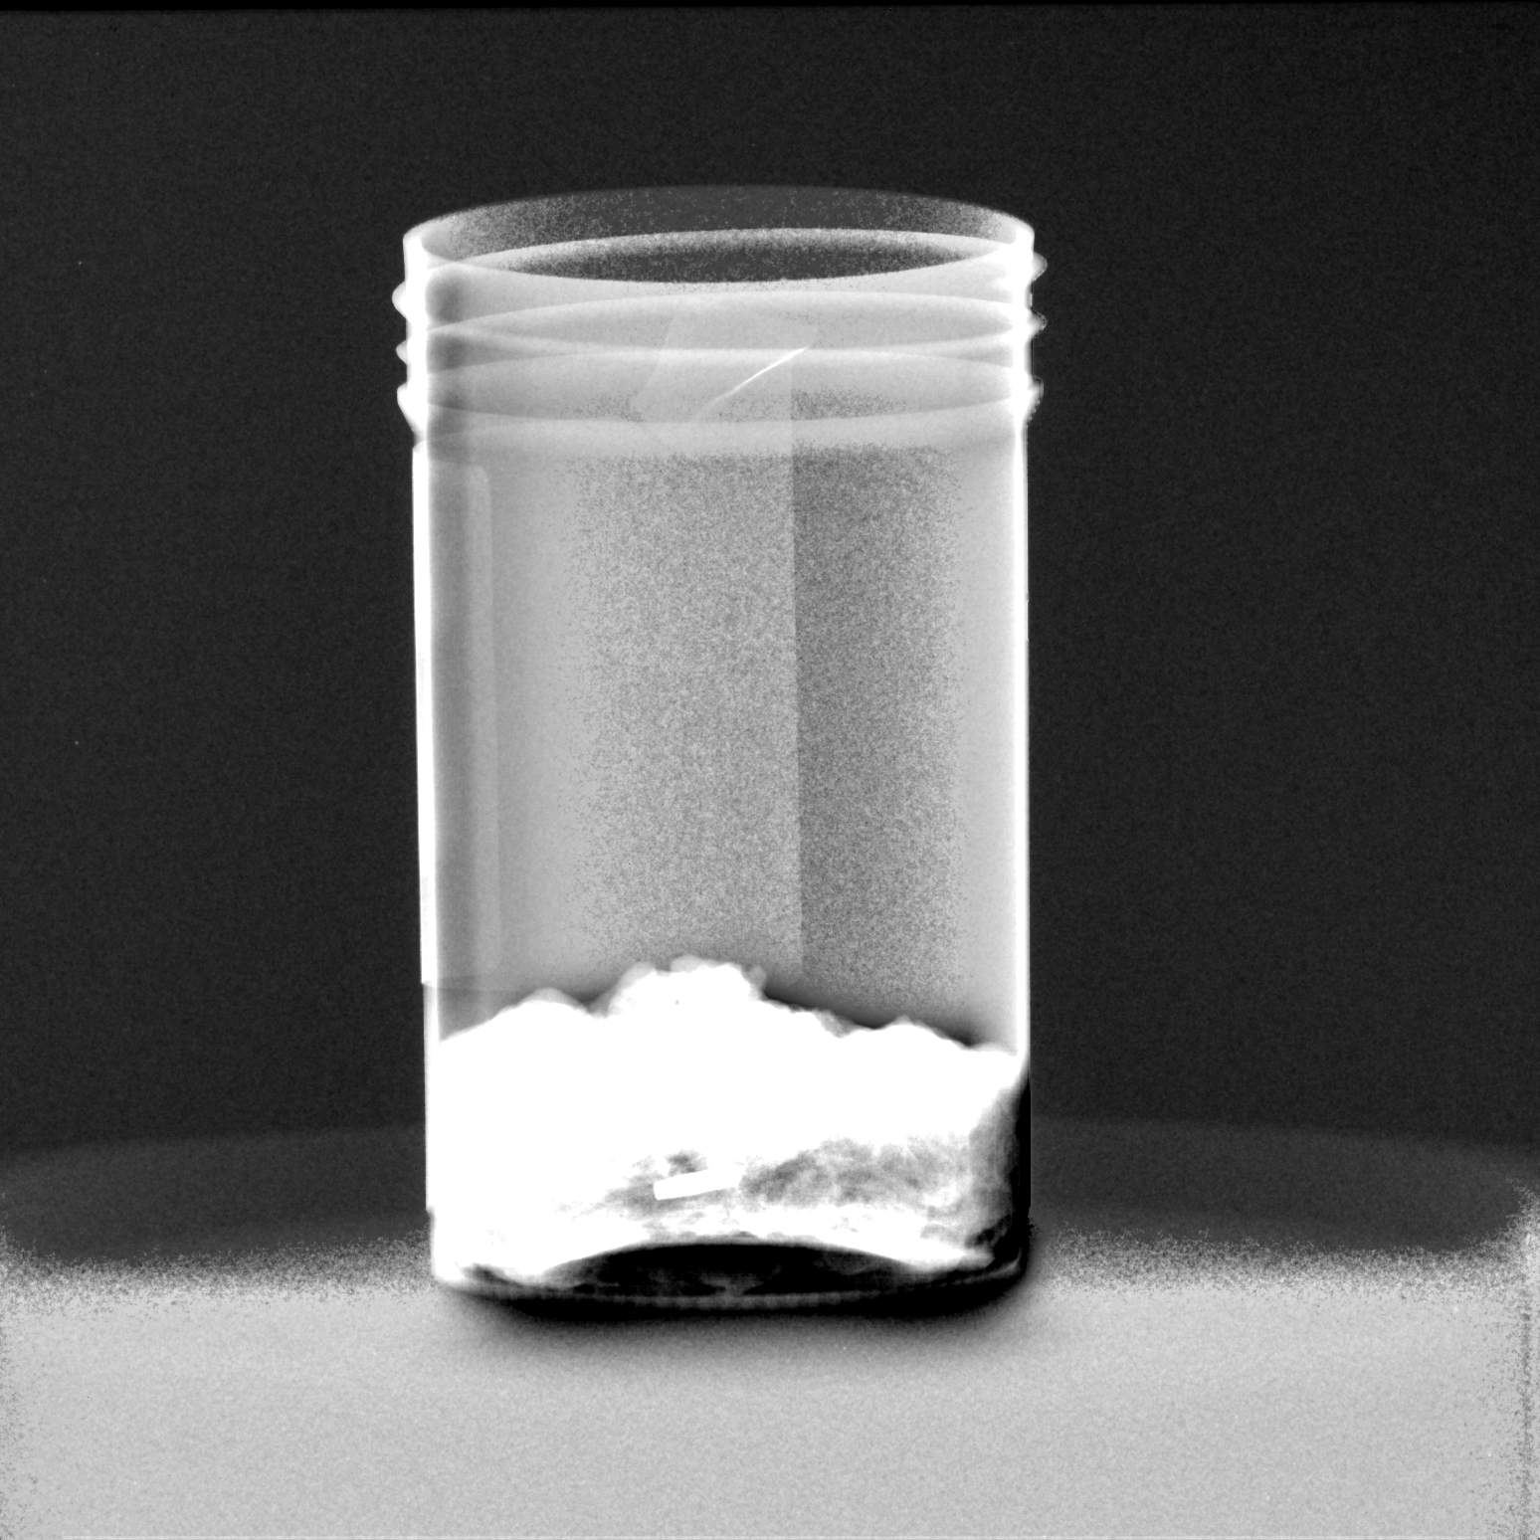

[2 of 2 positions shown; findings below may reference images not displayed]

FINDINGS: Status post excision of the right breast. The radioactive seed and
biopsy marker clip is present within the specimen. These findings
were communicated with the OR at [DATE] p.m.
IMPRESSION: Specimen radiograph of the right breast.

## 2024-02-14 ENCOUNTER — Ambulatory Visit: Admitting: Internal Medicine

## 2024-02-22 ENCOUNTER — Encounter: Payer: Self-pay | Admitting: Hematology and Oncology

## 2024-02-22 ENCOUNTER — Encounter: Payer: Self-pay | Admitting: Internal Medicine

## 2024-02-22 ENCOUNTER — Ambulatory Visit

## 2024-02-22 ENCOUNTER — Ambulatory Visit: Admitting: Internal Medicine

## 2024-02-22 VITALS — BP 122/78 | HR 75 | Temp 98.4°F | Ht 67.0 in | Wt 176.0 lb

## 2024-02-22 DIAGNOSIS — Z85820 Personal history of malignant melanoma of skin: Secondary | ICD-10-CM | POA: Diagnosis not present

## 2024-02-22 DIAGNOSIS — R030 Elevated blood-pressure reading, without diagnosis of hypertension: Secondary | ICD-10-CM

## 2024-02-22 DIAGNOSIS — M545 Low back pain, unspecified: Secondary | ICD-10-CM

## 2024-02-22 DIAGNOSIS — F988 Other specified behavioral and emotional disorders with onset usually occurring in childhood and adolescence: Secondary | ICD-10-CM | POA: Diagnosis not present

## 2024-02-22 DIAGNOSIS — R739 Hyperglycemia, unspecified: Secondary | ICD-10-CM

## 2024-02-22 DIAGNOSIS — G8929 Other chronic pain: Secondary | ICD-10-CM

## 2024-02-22 DIAGNOSIS — E7800 Pure hypercholesterolemia, unspecified: Secondary | ICD-10-CM | POA: Diagnosis not present

## 2024-02-22 DIAGNOSIS — E559 Vitamin D deficiency, unspecified: Secondary | ICD-10-CM | POA: Diagnosis not present

## 2024-02-22 NOTE — Patient Instructions (Signed)
 Ok to hold on taking the statin for now  Please continue all other medications as before, and refills have been done if requested.  Please have the pharmacy call with any other refills you may need.  Please continue your efforts at being more active, low cholesterol diet, and weight control..  Please keep your appointments with your specialists as you may have planned - oncology  Please go to the XRAY Department in the first floor for the x-ray testing  We can hold on lab testing today  Please make an Appointment to return in 6 months, or sooner if needed

## 2024-02-22 NOTE — Progress Notes (Unsigned)
 Patient ID: Robin Farley, female   DOB: 02-09-1966, 58 y.o.   MRN: [id number]        Chief Complaint: follow up left lower back pain, low vit d, hld, hyperglycemia, elev  BP without htn       HPI:  Robin Farley is a 58 y.o. female here with c/o 1 wk onset mid lumbar and left lower back pain, sharp, mild to mod but persistent, worse to stand up and bend.  Pt denies chest pain, increased sob or doe, wheezing, orthopnea, PND, increased LE swelling, palpitations, dizziness or syncope.   Pt denies polydipsia, polyuria, or new focal neuro s/s.    Pt denies fever, wt loss, night sweats, loss of appetite, or other constitutional symptoms  Does not wish statin after recent Card Ct score Zero.  Denies worsening reflux, abd pain, dysphagia, n/v, bowel change or blood.  Denies urinary symptoms such as dysuria, frequency, urgency, flank pain, hematuria or n/v, fever, chills.  Bp has been controlled at home     Wt Readings from Last 3 Encounters:  02/22/24 176 lb (79.8 kg)  11/01/23 170 lb 4 oz (77.2 kg)  10/06/23 170 lb (77.1 kg)   BP Readings from Last 3 Encounters:  02/22/24 122/78  11/01/23 (!) 154/86  10/06/23 (!) 153/76         Past Medical History:  Diagnosis Date   Abdominal pain, generalized 12/27/2008   Acute sinusitis, unspecified 02/26/2007   ADD 03/13/2010   ALLERGIC RHINITIS 02/23/2007   ANEMIA 02/05/2007   Anxiety 11/17/2011   ATTENTION DEFICIT DISORDER, HX OF 02/05/2007   Breast cancer (HCC)    Deviated nasal septum 02/05/2007   Family history of breast cancer 11/20/2020   Family history of melanoma 11/20/2020   FATIGUE 02/23/2007   GERD (gastroesophageal reflux disease)    HYPERLIPIDEMIA 02/23/2007   Personal history of chemotherapy    Personal history of malignant melanoma 11/20/2020   Personal history of radiation therapy    Port-A-Cath in place 12/03/2020   SWELLING MASS OR LUMP IN HEAD AND NECK 07/27/2008   UTI 02/23/2007   Past Surgical History:  Procedure  Laterality Date   BREAST IMPLANT REMOVAL Right 07/27/2022   Procedure: Removal of right breast implant, washout, drain placemen;  Surgeon: Waddell Leonce NOVAK, MD;  Location: [facility];  Service: Plastics;  Laterality: Right;   BREAST RECONSTRUCTION WITH PLACEMENT OF TISSUE EXPANDER AND FLEX HD (ACELLULAR HYDRATED DERMIS) Right 05/08/2021   Procedure: BREAST RECONSTRUCTION WITH PLACEMENT OF TISSUE EXPANDER AND FLEX HD (ACELLULAR HYDRATED DERMIS);  Surgeon: Elisabeth Craig RAMAN, MD;  Location: [facility];  Service: Plastics;  Laterality: Right;   BROW LIFT     CESAREAN SECTION     COLONOSCOPY  2016   Abran TA 5 yr recall   COSMETIC SURGERY  1995   Nasal   INCISION AND DRAINAGE OF WOUND Right 09/22/2022   Procedure: right breast washout, debridement, drain placement;  Surgeon: Waddell Leonce NOVAK, MD;  Location: [facility];  Service: Plastics;  Laterality: Right;   MASTECTOMY     MASTECTOMY W/ SENTINEL NODE BIOPSY Right 05/08/2021   Procedure: RIGHT MASTECTOMY WITH SENTINEL LYMPH NODE BIOPSY;  Surgeon: Belinda Cough, MD;  Location:  SURGERY CENTER;  Service: General;  Laterality: Right;   MELANOMA EXCISION WITH SENTINEL LYMPH NODE BIOPSY Left 10/24/2012   Procedure: MELANOMA wide EXCISION left lateral thigh WITH SENTINEL LYMPH NODE BIOPSY left groin;  Surgeon: Morene ONEIDA Olives, MD;  Location: [facility];  Service: General;  Laterality: Left;   NASAL SEPTUM SURGERY     PORTACATH PLACEMENT Right 12/02/2020   Procedure: INSERTION PORT-A-CATH;  Surgeon: Belinda Cough, MD;  Location: [facility];  Service: General;  Laterality: Right;   RADIOACTIVE SEED GUIDED AXILLARY SENTINEL LYMPH NODE Right 05/08/2021   Procedure: RADIOACTIVE SEED GUIDED AXILLARY SENTINEL LYMPH NODE DISSECTION;  Surgeon: Belinda Cough, MD;  Location: [facility];  Service: General;  Laterality: Right;   REMOVAL OF TISSUE EXPANDER AND PLACEMENT OF IMPLANT  Right 05/05/2022   Procedure: REMOVAL OF TISSUE EXPANDER AND PLACEMENT OF IMPLANT;  Surgeon: Waddell Leonce NOVAK, MD;  Location: [facility];  Service: Plastics;  Laterality: Right;    reports that she has never smoked. She has never used smokeless tobacco. She reports that she does not currently use alcohol. She reports that she does not use drugs. family history includes Alcohol abuse in an other family member; Atrial fibrillation in her father and sister; Brain cancer (age of onset: 96) in her paternal grandfather; Breast cancer in an other family member; Breast cancer (age of onset: 62) in her mother; COPD in her father; Cancer in her paternal aunt; Depression in an other family member; Glaucoma in an other family member; Hypertension in her mother; Kidney cancer in her maternal aunt; Leukemia in her paternal aunt; Melanoma in her cousin. Allergies  Allergen Reactions   Promethazine  Hcl     Legs twitch   Current Outpatient Medications on File Prior to Visit  Medication Sig Dispense Refill   amphetamine -dextroamphetamine  (ADDERALL) 20 MG tablet Take 1.5 tabs by mouth twice per day 90 tablet 0   Berberine Chloride (BERBERINE HCI PO) Take by mouth.     cyclobenzaprine  (FLEXERIL ) 5 MG tablet Take 1 tablet (5 mg total) by mouth 3 (three) times daily as needed. 40 tablet 1   estradiol  (ESTRACE ) 0.1 MG/GM vaginal cream Apply a pea-sized amount to vaginal opening using index finger 2-3 nights/week 42.5 g 3   melatonin 5 MG TABS Take 5 mg by mouth at bedtime as needed (sleep).     metroNIDAZOLE  (FLAGYL ) 500 MG tablet Take 1 tablet (500 mg total) by mouth 3 (three) times daily. 21 tablet 0   Na Sulfate-K Sulfate-Mg Sulfate concentrate (SUPREP) 17.5-3.13-1.6 GM/177ML SOLN as directed.     OVER THE COUNTER MEDICATION Bosmed QD     OVER THE COUNTER MEDICATION ANDEROGRAPHIS QD     Plecanatide  (TRULANCE ) 3 MG TABS 1 tab by mouth once daily 30 tablet 11   TURMERIC CURCUMIN PO Take 750 mg by  mouth daily.     No current facility-administered medications on file prior to visit.        ROS:  All others reviewed and negative.  Objective        PE:  BP 122/78 (BP Location: Right Arm, Patient Position: Sitting, Cuff Size: Normal)   Pulse 75   Temp 98.4 F (36.9 C) (Oral)   Ht 5' 7 (1.702 m)   Wt 176 lb (79.8 kg)   LMP 04/27/2016   SpO2 98%   BMI 27.57 kg/m                 Constitutional: Pt appears in NAD               HENT: Head: NCAT.                Right Ear: External ear normal.  Left Ear: External ear normal.                Eyes: . Pupils are equal, round, and reactive to light. Conjunctivae and EOM are normal               Nose: without d/c or deformity               Neck: Neck supple. Gross normal ROM               Cardiovascular: Normal rate and regular rhythm.                 Pulmonary/Chest: Effort normal and breath sounds without rales or wheezing.                Abd:  Soft, NT, ND, + BS, no organomegaly               Neurological: Pt is alert. At baseline orientation, motor grossly intactl ls spine nontender, does have mild left paraspinal tender               Skin: Skin is warm. No rashes, no other new lesions, LE edema - none               Psychiatric: Pt behavior is normal without agitation   Micro: none  Cardiac tracings I have personally interpreted today:  none  Pertinent Radiological findings (summarize): none   Lab Results  Component Value Date   WBC 3.3 (L) 08/12/2023   HGB 13.5 08/12/2023   HCT 39.9 08/12/2023   PLT 206.0 08/12/2023   GLUCOSE 95 08/12/2023   CHOL 294 (H) 08/12/2023   TRIG 94.0 08/12/2023   HDL 69.20 08/12/2023   LDLDIRECT 159.0 08/21/2014   LDLCALC 206 (H) 08/12/2023   ALT 15 08/12/2023   AST 19 08/12/2023   NA 139 08/12/2023   K 3.9 08/12/2023   CL 104 08/12/2023   CREATININE 0.80 08/12/2023   BUN 14 08/12/2023   CO2 25 08/12/2023   TSH 1.89 08/12/2023   HGBA1C 5.2 08/12/2023   MICROALBUR 0.9  08/12/2023   Assessment/Plan:  ASEES MANFREDI is a 58 y.o. White or Caucasian [1] female with  has a past medical history of Abdominal pain, generalized (12/27/2008), Acute sinusitis, unspecified (02/26/2007), ADD (03/13/2010), ALLERGIC RHINITIS (02/23/2007), ANEMIA (02/05/2007), Anxiety (11/17/2011), ATTENTION DEFICIT DISORDER, HX OF (02/05/2007), Breast cancer (HCC), Deviated nasal septum (02/05/2007), Family history of breast cancer (11/20/2020), Family history of melanoma (11/20/2020), FATIGUE (02/23/2007), GERD (gastroesophageal reflux disease), HYPERLIPIDEMIA (02/23/2007), Personal history of chemotherapy, Personal history of malignant melanoma (11/20/2020), Personal history of radiation therapy, Port-A-Cath in place (12/03/2020), SWELLING MASS OR LUMP IN HEAD AND NECK (07/27/2008), and UTI (02/23/2007).  Personal history of malignant melanoma Remote, no recurrence, cont to follow  Vitamin D  deficiency Last vitamin D  Lab Results  Component Value Date   VD25OH 23.04 (L) 08/12/2023   Low, to start oral replacement   Low back pain Mild to mod, for ls spine film as is new problem, to f/u any worsening symptoms or concerns  Hyperlipidemia Lab Results  Component Value Date   LDLCALC 206 (H) 08/12/2023   Uncontrolled, but pt delcines statin , for lower chol diet, recent Card Ct score zero   Hyperglycemia Lab Results  Component Value Date   HGBA1C 5.2 08/12/2023   Stable, pt to continue current medical treatment - diet,wt control   Blood pressure elevated without history of HTN BP Readings from Last 3 Encounters:  02/22/24 122/78  11/01/23 (!) 154/86  10/06/23 (!) 153/76   Stable today, pt to continue diet, wt control   Attention deficit disorder Stable , cont adderal  Followup: Return in about 6 months (around 08/21/2024).  Lynwood Rush, MD 02/25/2024 7:38 PM [facility]

## 2024-02-24 ENCOUNTER — Ambulatory Visit: Payer: Self-pay | Admitting: Internal Medicine

## 2024-02-24 ENCOUNTER — Other Ambulatory Visit: Payer: Self-pay | Admitting: Internal Medicine

## 2024-02-24 ENCOUNTER — Other Ambulatory Visit: Payer: Self-pay

## 2024-02-25 ENCOUNTER — Encounter: Payer: Self-pay | Admitting: Internal Medicine

## 2024-02-25 NOTE — Assessment & Plan Note (Signed)
 Stable , cont adderal

## 2024-02-25 NOTE — Assessment & Plan Note (Signed)
 Last vitamin D  Lab Results  Component Value Date   VD25OH 23.04 (L) 08/12/2023   Low, to start oral replacement

## 2024-02-25 NOTE — Assessment & Plan Note (Addendum)
 BP Readings from Last 3 Encounters:  02/22/24 122/78  11/01/23 (!) 154/86  10/06/23 (!) 153/76   Stable today, pt to continue diet, wt control

## 2024-02-25 NOTE — Assessment & Plan Note (Signed)
 Lab Results  Component Value Date   HGBA1C 5.2 08/12/2023   Stable, pt to continue current medical treatment - diet,wt control

## 2024-02-25 NOTE — Assessment & Plan Note (Signed)
 Mild to mod, for ls spine film as is new problem, to f/u any worsening symptoms or concerns

## 2024-02-25 NOTE — Assessment & Plan Note (Signed)
 Remote, no recurrence, cont to follow

## 2024-02-25 NOTE — Assessment & Plan Note (Signed)
 Lab Results  Component Value Date   LDLCALC 206 (H) 08/12/2023   Uncontrolled, but pt delcines statin , for lower chol diet, recent Card Ct score zero

## 2024-04-02 NOTE — Progress Notes (Unsigned)
 "   Assessment: Overactive bladder with urgency and incontinence  Vaginal atrophy   Plan: -I would recommend we start with behavioral modification as she does drink a fair amount of caffeine.  She was given an overactive bladder guide sheet  -If that alone does not help, I have recommended she eventually see physical therapy for pelvic floor therapy  -I we will prescribe her estrogen cream to use 2-3 nights a week  -Return visit in 6 mos   History of Present Illness:  6.25.2025: Initial visit for evaluation of urinary incontinence.  This is mainly related to urgency.  She also has urinary frequency.  This is quite bothersome to her.  She has had symptoms like this, worsening over the past 9 years.  She has a good stream.  She feels like she empties well.  She is not treated for frequent urinary tract infections.  She also has vaginal discomfort.  Because of this, sexual activity has decreased.   Past Medical History:  Past Medical History:  Diagnosis Date   Abdominal pain, generalized 12/27/2008   Acute sinusitis, unspecified 02/26/2007   ADD 03/13/2010   ALLERGIC RHINITIS 02/23/2007   ANEMIA 02/05/2007   Anxiety 11/17/2011   ATTENTION DEFICIT DISORDER, HX OF 02/05/2007   Breast cancer (HCC)    Deviated nasal septum 02/05/2007   Family history of breast cancer 11/20/2020   Family history of melanoma 11/20/2020   FATIGUE 02/23/2007   GERD (gastroesophageal reflux disease)    HYPERLIPIDEMIA 02/23/2007   Personal history of chemotherapy    Personal history of malignant melanoma 11/20/2020   Personal history of radiation therapy    Port-A-Cath in place 12/03/2020   SWELLING MASS OR LUMP IN HEAD AND NECK 07/27/2008   UTI 02/23/2007    Past Surgical History:  Past Surgical History:  Procedure Laterality Date   BREAST IMPLANT REMOVAL Right 07/27/2022   Procedure: Removal of right breast implant, washout, drain placemen;  Surgeon: Waddell Leonce NOVAK, MD;  Location: [facility];   Service: Plastics;  Laterality: Right;   BREAST RECONSTRUCTION WITH PLACEMENT OF TISSUE EXPANDER AND FLEX HD (ACELLULAR HYDRATED DERMIS) Right 05/08/2021   Procedure: BREAST RECONSTRUCTION WITH PLACEMENT OF TISSUE EXPANDER AND FLEX HD (ACELLULAR HYDRATED DERMIS);  Surgeon: Elisabeth Craig RAMAN, MD;  Location: [facility];  Service: Plastics;  Laterality: Right;   BROW LIFT     CESAREAN SECTION     COLONOSCOPY  2016   Abran TA 5 yr recall   COSMETIC SURGERY  1995   Nasal   INCISION AND DRAINAGE OF WOUND Right 09/22/2022   Procedure: right breast washout, debridement, drain placement;  Surgeon: Waddell Leonce NOVAK, MD;  Location: [facility];  Service: Plastics;  Laterality: Right;   MASTECTOMY     MASTECTOMY W/ SENTINEL NODE BIOPSY Right 05/08/2021   Procedure: RIGHT MASTECTOMY WITH SENTINEL LYMPH NODE BIOPSY;  Surgeon: Belinda Cough, MD;  Location: [facility];  Service: General;  Laterality: Right;   MELANOMA EXCISION WITH SENTINEL LYMPH NODE BIOPSY Left 10/24/2012   Procedure: MELANOMA wide EXCISION left lateral thigh WITH SENTINEL LYMPH NODE BIOPSY left groin;  Surgeon: Morene ONEIDA Olives, MD;  Location: [facility];  Service: General;  Laterality: Left;   NASAL SEPTUM SURGERY     PORTACATH PLACEMENT Right 12/02/2020   Procedure: INSERTION PORT-A-CATH;  Surgeon: Belinda Cough, MD;  Location:  SURGERY CENTER;  Service: General;  Laterality: Right;   RADIOACTIVE SEED GUIDED AXILLARY SENTINEL LYMPH NODE  Right 05/08/2021   Procedure: RADIOACTIVE SEED GUIDED AXILLARY SENTINEL LYMPH NODE DISSECTION;  Surgeon: Belinda Cough, MD;  Location: [facility];  Service: General;  Laterality: Right;   REMOVAL OF TISSUE EXPANDER AND PLACEMENT OF IMPLANT Right 05/05/2022   Procedure: REMOVAL OF TISSUE EXPANDER AND PLACEMENT OF IMPLANT;  Surgeon: Waddell Leonce NOVAK, MD;  Location: [facility];  Service: Plastics;   Laterality: Right;    Allergies:  Allergies  Allergen Reactions   Promethazine  Hcl     Legs twitch    Family History:  Family History  Problem Relation Age of Onset   Hypertension Mother    Breast cancer Mother 68   COPD Father    Atrial fibrillation Father    Atrial fibrillation Sister    Kidney cancer Maternal Aunt        dx 60s   Leukemia Paternal Aunt        dx after 30   Cancer Paternal Aunt        unknown type; dx after 53   Brain cancer Paternal Grandfather 26       brain tumor per pt   Melanoma Cousin        arm; no mets per pt   Alcohol abuse Other    Depression Other    Glaucoma Other    Breast cancer Other        MGM's sisters, x3, dx after 45   Colon polyps Neg Hx    Crohn's disease Neg Hx    Esophageal cancer Neg Hx    Stomach cancer Neg Hx    Rectal cancer Neg Hx     Social History:  Social History   Tobacco Use   Smoking status: Never   Smokeless tobacco: Never  Vaping Use   Vaping status: Never Used  Substance Use Topics   Alcohol use: Not Currently    Comment: Very rare occasions   Drug use: Never      Results: I have reviewed referring/prior physicians records  I have reviewed urinalysis  I have reviewed prior urine cultures    "

## 2024-04-03 ENCOUNTER — Encounter: Payer: Self-pay | Admitting: Urology

## 2024-04-03 ENCOUNTER — Ambulatory Visit: Admitting: Urology

## 2024-04-03 VITALS — BP 161/92 | HR 84 | Ht 67.0 in | Wt 178.6 lb

## 2024-04-03 DIAGNOSIS — R32 Unspecified urinary incontinence: Secondary | ICD-10-CM

## 2024-04-03 DIAGNOSIS — R8271 Bacteriuria: Secondary | ICD-10-CM

## 2024-04-03 DIAGNOSIS — N3281 Overactive bladder: Secondary | ICD-10-CM

## 2024-04-03 DIAGNOSIS — N952 Postmenopausal atrophic vaginitis: Secondary | ICD-10-CM | POA: Diagnosis not present

## 2024-04-03 DIAGNOSIS — R3915 Urgency of urination: Secondary | ICD-10-CM | POA: Diagnosis not present

## 2024-04-03 LAB — MICROSCOPIC EXAMINATION
Epithelial Cells (non renal): >10 /HPF — AB (ref 0–10)
WBC, UA: >30 /HPF — AB (ref 0–5)

## 2024-04-03 LAB — URINALYSIS, ROUTINE W REFLEX MICROSCOPIC
Bilirubin, UA: NEGATIVE
Glucose, UA: NEGATIVE
Nitrite, UA: NEGATIVE
Protein,UA: NEGATIVE
RBC, UA: NEGATIVE
Specific Gravity, UA: 1.015 (ref 1.005–1.030)
Urobilinogen, Ur: 0.2 mg/dL (ref 0.2–1.0)
pH, UA: 6 (ref 5.0–7.5)

## 2024-04-05 LAB — URINE CULTURE

## 2024-04-10 ENCOUNTER — Ambulatory Visit: Payer: Self-pay | Admitting: Urology

## 2024-05-02 ENCOUNTER — Inpatient Hospital Stay

## 2024-05-02 ENCOUNTER — Inpatient Hospital Stay: Attending: Hematology and Oncology | Admitting: Hematology and Oncology

## 2024-05-02 VITALS — BP 148/77 | HR 77 | Temp 98.0°F | Resp 17 | Wt 178.0 lb

## 2024-05-02 DIAGNOSIS — Z9221 Personal history of antineoplastic chemotherapy: Secondary | ICD-10-CM | POA: Insufficient documentation

## 2024-05-02 DIAGNOSIS — Z171 Estrogen receptor negative status [ER-]: Secondary | ICD-10-CM

## 2024-05-02 DIAGNOSIS — C50411 Malignant neoplasm of upper-outer quadrant of right female breast: Secondary | ICD-10-CM

## 2024-05-02 DIAGNOSIS — Z9011 Acquired absence of right breast and nipple: Secondary | ICD-10-CM | POA: Diagnosis not present

## 2024-05-02 DIAGNOSIS — Z923 Personal history of irradiation: Secondary | ICD-10-CM | POA: Insufficient documentation

## 2024-05-02 DIAGNOSIS — Z853 Personal history of malignant neoplasm of breast: Secondary | ICD-10-CM | POA: Diagnosis present

## 2024-05-02 LAB — CMP (CANCER CENTER ONLY)
ALT: 16 U/L (ref 0–44)
AST: 17 U/L (ref 15–41)
Albumin: 4.5 g/dL (ref 3.5–5.0)
Alkaline Phosphatase: 120 U/L (ref 38–126)
Anion gap: 11 (ref 5–15)
BUN: 13 mg/dL (ref 6–20)
CO2: 25 mmol/L (ref 22–32)
Calcium: 9.7 mg/dL (ref 8.9–10.3)
Chloride: 104 mmol/L (ref 98–111)
Creatinine: 0.88 mg/dL (ref 0.44–1.00)
GFR, Estimated: >60 mL/min
Glucose, Bld: 100 mg/dL — ABNORMAL HIGH (ref 70–99)
Potassium: 4.3 mmol/L (ref 3.5–5.1)
Sodium: 140 mmol/L (ref 135–145)
Total Bilirubin: 0.3 mg/dL (ref 0.0–1.2)
Total Protein: 7.2 g/dL (ref 6.5–8.1)

## 2024-05-02 LAB — CBC WITH DIFFERENTIAL/PLATELET
Abs Immature Granulocytes: 0.01 K/uL (ref 0.00–0.07)
Basophils Absolute: 0 K/uL (ref 0.0–0.1)
Basophils Relative: 1 %
Eosinophils Absolute: 0.1 K/uL (ref 0.0–0.5)
Eosinophils Relative: 2 %
HCT: 38.6 % (ref 36.0–46.0)
Hemoglobin: 13.4 g/dL (ref 12.0–15.0)
Immature Granulocytes: 0 %
Lymphocytes Relative: 28 %
Lymphs Abs: 1.1 K/uL (ref 0.7–4.0)
MCH: 31.8 pg (ref 26.0–34.0)
MCHC: 34.7 g/dL (ref 30.0–36.0)
MCV: 91.7 fL (ref 80.0–100.0)
Monocytes Absolute: 0.3 K/uL (ref 0.1–1.0)
Monocytes Relative: 8 %
Neutro Abs: 2.4 K/uL (ref 1.7–7.7)
Neutrophils Relative %: 61 %
Platelets: 213 K/uL (ref 150–400)
RBC: 4.21 MIL/uL (ref 3.87–5.11)
RDW: 12.5 % (ref 11.5–15.5)
WBC: 3.9 K/uL — ABNORMAL LOW (ref 4.0–10.5)
nRBC: 0 % (ref 0.0–0.2)

## 2024-05-02 NOTE — Progress Notes (Signed)
 " BRIEF ONCOLOGIC HISTORY:  Oncology History  Malignant neoplasm of upper-outer quadrant of right breast in female, estrogen receptor negative (HCC)  11/12/2020 Initial Diagnosis   Malignant neoplasm of upper-outer quadrant of right breast in female, estrogen receptor positive (HCC)   11/20/2020 Cancer Staging   Staging form: Breast, AJCC 8th Edition - Clinical: Stage IIB (cT1c, cN1, cM0, G3, ER-, PR-, HER2-) - Signed by Layla Sandria BROCKS, MD on 11/20/2020 Histologic grading system: 3 grade system   12/03/2020 - 04/10/2021 Chemotherapy   Patient is on Treatment Plan : BREAST Pembrolizumab  + Carboplatin  D1 + Paclitaxel  D1,8,15 q21d X 4 cycles / Pembrolizumab  + AC q21d x 4 cycles     12/03/2020 Genetic Testing   Negative hereditary cancer genetic testing: no pathogenic variants detected in [facility] CancerNext-Expanded +RNAinsight Panel.  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  The report date is December 03, 2020.   The CancerNext-Expanded gene panel offered by [facility] and includes sequencing, rearrangement, and RNA analysis for the following 77 genes: AIP, ALK, APC, ATM, AXIN2, BAP1, BARD1, BLM, BMPR1A, BRCA1, BRCA2, BRIP1, CDC73, CDH1, CDK4, CDKN1B, CDKN2A, CHEK2, CTNNA1, DICER1, FANCC, FH, FLCN, GALNT12, KIF1B, LZTR1, MAX, MEN1, MET, MLH1, MSH2, MSH3, MSH6, MUTYH, NBN, NF1, NF2, NTHL1, PALB2, PHOX2B, PMS2, POT1, PRKAR1A, PTCH1, PTEN, RAD51C, RAD51D, RB1, RECQL, RET, SDHA, SDHAF2, SDHB, SDHC, SDHD, SMAD4, SMARCA4, SMARCB1, SMARCE1, STK11, SUFU, TMEM127, TP53, TSC1, TSC2, VHL and XRCC2 (sequencing and deletion/duplication); EGFR, EGLN1, HOXB13, KIT, MITF, PDGFRA, POLD1, and POLE (sequencing only); EPCAM and GREM1 (deletion/duplication only).    04/29/2021 - 11/04/2021 Chemotherapy   Patient is on Treatment Plan : BREAST Pembrolizumab  (200) q21d x 27 weeks     05/08/2021 Cancer Staging   Staging form: Breast, AJCC 8th Edition - Pathologic stage from 05/08/2021: No  Stage Recommended (ypT2, pN1a, cM0, G3, ER-, PR-, HER2-) - Signed by Crawford Morna Pickle, NP on 05/21/2021 Stage prefix: Post-therapy Histologic grading system: 3 grade system   05/08/2021 Surgery   She had right mastectomy and targeted right axillary dissection on 05/08/2021.  Pathology showed 2.3 x 1.7 x 1.0 cm invasive ductal carcinoma, grade 3 of 3, treatment effect was robust in the breast and minimal in lymph nodes.  All surgical margins negative for invasive carcinoma, out of 4 examined the lymph nodes, 2 with micrometastasis and 0 with micrometastasis or isolated tumor cells.  Largest metastatic deposit in the lymph node was 4 mm and extranodal extension was present.   06/23/2021 - 07/31/2021 Radiation Therapy   06/23/2021 through 07/31/2021 Site Technique Total Dose (Gy) Dose per Fx (Gy) Completed Fx Beam Energies  Chest Wall, Right: CW_R_IMN 3D 50.4/50.4 1.8 28/28 6X  Chest Wall, Right: CW_R_PAB_SCV 3D 50.4/50.4 1.8 28/28 6X, 10X       INTERVAL HISTORY:   History of Present Illness Discussed the use of AI scribe software for clinical note transcription with the patient, who gave verbal consent to proceed.  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 She denies any new persistent health issues. She does mention the paranoia everytime she has a new symptom but no symptoms persist. Her last guardant reveal in Oct didn't show any detectable ctDNA. No change in breathing or bowel habits or urinary habits.  Rest of the pertinent 10 point ROS reviewed and neg.  REVIEW OF SYSTEMS:  Review of Systems  Constitutional:  Negative for appetite change, chills, fatigue, fever and unexpected weight change.  HENT:   Negative for hearing loss, lump/mass and trouble swallowing.   Eyes:  Negative for eye problems and icterus.  Respiratory:  Negative for chest tightness, cough and shortness of breath.   Cardiovascular:  Negative for chest pain, leg swelling and  palpitations.  Gastrointestinal:  Negative for abdominal distention, abdominal pain, constipation, diarrhea, nausea and vomiting.  Endocrine: Negative for hot flashes.  Genitourinary:  Negative for difficulty urinating.   Musculoskeletal:  Negative for arthralgias and back pain.  Skin:  Negative for itching and rash.  Neurological:  Negative for dizziness, extremity weakness, headaches and numbness.  Hematological:  Negative for adenopathy. Does not bruise/bleed easily.  Psychiatric/Behavioral:  Negative for depression. The patient is not nervous/anxious.   Breast: Denies any new nodularity, masses, tenderness, nipple changes, or nipple discharge.    ONCOLOGY TREATMENT TEAM:  1. Surgeon:  Dr. Belinda at [facility] 2. Medical Oncologist: Dr. Loretha  3. Radiation Oncologist: Dr. Izell    PAST MEDICAL/SURGICAL HISTORY:  Past Medical History:  Diagnosis Date   Abdominal pain, generalized 12/27/2008   Acute sinusitis, unspecified 02/26/2007   ADD 03/13/2010   ALLERGIC RHINITIS 02/23/2007   ANEMIA 02/05/2007   Anxiety 11/17/2011   ATTENTION DEFICIT DISORDER, HX OF 02/05/2007   Breast cancer (HCC)    Deviated nasal septum 02/05/2007   Family history of breast cancer 11/20/2020   Family history of melanoma 11/20/2020   FATIGUE 02/23/2007   GERD (gastroesophageal reflux disease)    HYPERLIPIDEMIA 02/23/2007   Personal history of chemotherapy    Personal history of malignant melanoma 11/20/2020   Personal history of radiation therapy    Port-A-Cath in place 12/03/2020   SWELLING MASS OR LUMP IN HEAD AND NECK 07/27/2008   UTI 02/23/2007   Past Surgical History:  Procedure Laterality Date   BREAST IMPLANT REMOVAL Right 07/27/2022   Procedure: Removal of right breast implant, washout, drain placemen;  Surgeon: Waddell Leonce NOVAK, MD;  Location: [facility];  Service: Plastics;  Laterality: Right;   BREAST RECONSTRUCTION WITH PLACEMENT OF TISSUE EXPANDER AND FLEX HD (ACELLULAR  HYDRATED DERMIS) Right 05/08/2021   Procedure: BREAST RECONSTRUCTION WITH PLACEMENT OF TISSUE EXPANDER AND FLEX HD (ACELLULAR HYDRATED DERMIS);  Surgeon: Elisabeth Craig RAMAN, MD;  Location: [facility];  Service: Plastics;  Laterality: Right;   BROW LIFT     CESAREAN SECTION     COLONOSCOPY  2016   Abran TA 5 yr recall   COSMETIC SURGERY  1995   Nasal   INCISION AND DRAINAGE OF WOUND Right 09/22/2022   Procedure: right breast washout, debridement, drain placement;  Surgeon: Waddell Leonce NOVAK, MD;  Location: [facility];  Service: Plastics;  Laterality: Right;   MASTECTOMY     MASTECTOMY W/ SENTINEL NODE BIOPSY Right 05/08/2021   Procedure: RIGHT MASTECTOMY WITH SENTINEL LYMPH NODE BIOPSY;  Surgeon: Belinda Cough, MD;  Location: [facility];  Service: General;  Laterality: Right;   MELANOMA EXCISION WITH SENTINEL LYMPH NODE BIOPSY Left 10/24/2012   Procedure: MELANOMA wide EXCISION left lateral thigh WITH SENTINEL LYMPH NODE BIOPSY left groin;  Surgeon: Morene ONEIDA Olives, MD;  Location: [facility];  Service: General;  Laterality: Left;   NASAL SEPTUM SURGERY     PORTACATH PLACEMENT Right 12/02/2020   Procedure: INSERTION PORT-A-CATH;  Surgeon: Belinda Cough, MD;  Location: [facility];  Service: General;  Laterality: Right;   RADIOACTIVE SEED GUIDED AXILLARY SENTINEL LYMPH NODE Right 05/08/2021   Procedure: RADIOACTIVE  SEED GUIDED AXILLARY SENTINEL LYMPH NODE DISSECTION;  Surgeon: Belinda Cough, MD;  Location: [facility];  Service: General;  Laterality: Right;   REMOVAL OF TISSUE EXPANDER AND PLACEMENT OF IMPLANT Right 05/05/2022   Procedure: REMOVAL OF TISSUE EXPANDER AND PLACEMENT OF IMPLANT;  Surgeon: Waddell Leonce NOVAK, MD;  Location: [facility];  Service: Plastics;  Laterality: Right;     ALLERGIES:  Allergies  Allergen Reactions   Promethazine  Hcl     Legs twitch     CURRENT  MEDICATIONS:  Outpatient Encounter Medications as of 05/02/2024  Medication Sig   amphetamine -dextroamphetamine  (ADDERALL) 20 MG tablet Take 1.5 tabs by mouth twice per day   Berberine Chloride (BERBERINE HCI PO) Take by mouth.   cyclobenzaprine  (FLEXERIL ) 5 MG tablet Take 1 tablet (5 mg total) by mouth 3 (three) times daily as needed.   estradiol  (ESTRACE ) 0.1 MG/GM vaginal cream Apply a pea-sized amount to vaginal opening using index finger 2-3 nights/week   melatonin 5 MG TABS Take 5 mg by mouth at bedtime as needed (sleep).   meloxicam  (MOBIC ) 15 MG tablet TAKE 1 TABLET BY MOUTH EVERY DAY AS NEEDED FOR PAIN   metroNIDAZOLE  (FLAGYL ) 500 MG tablet Take 1 tablet (500 mg total) by mouth 3 (three) times daily. (Patient not taking: Reported on 04/03/2024)   Na Sulfate-K Sulfate-Mg Sulfate concentrate (SUPREP) 17.5-3.13-1.6 GM/177ML SOLN as directed.   OVER THE COUNTER MEDICATION Bosmed QD   OVER THE COUNTER MEDICATION ANDEROGRAPHIS QD   Plecanatide  (TRULANCE ) 3 MG TABS 1 tab by mouth once daily   TURMERIC CURCUMIN PO Take 750 mg by mouth daily.   No facility-administered encounter medications on file as of 05/02/2024.     ONCOLOGIC FAMILY HISTORY:  Family History  Problem Relation Age of Onset   Hypertension Mother    Breast cancer Mother 33   COPD Father    Atrial fibrillation Father    Atrial fibrillation Sister    Kidney cancer Maternal Aunt        dx 15s   Leukemia Paternal Aunt        dx after 16   Cancer Paternal Aunt        unknown type; dx after 19   Brain cancer Paternal Grandfather 64       brain tumor per pt   Melanoma Cousin        arm; no mets per pt   Alcohol abuse Other    Depression Other    Glaucoma Other    Breast cancer Other        MGM's sisters, x3, dx after 70   Colon polyps Neg Hx    Crohn's disease Neg Hx    Esophageal cancer Neg Hx    Stomach cancer Neg Hx    Rectal cancer Neg Hx      SOCIAL HISTORY:  Social History   Socioeconomic History    Marital status: Married    Spouse name: Geologist, Engineering   Number of children: 2   Years of education: Not on file   Highest education level: Master's degree (e.g., MA, Robin, MEng, MEd, MSW, MBA)  Occupational History   Occupation: master's in fine arts [employer]    Employer: [employer]  Tobacco Use   Smoking status: Never   Smokeless tobacco: Never  Vaping Use   Vaping status: Never Used  Substance and Sexual Activity   Alcohol use: Not Currently    Comment: Very rare occasions   Drug use: Never  Sexual activity: Yes    Birth control/protection: None, Post-menopausal  Other Topics Concern   Not on file  Social History Narrative   Not on file   Social Drivers of Health   Tobacco Use: Low Risk (02/25/2024)   Patient History    Smoking Tobacco Use: Never    Smokeless Tobacco Use: Never    Passive Exposure: Not on file  Financial Resource Strain: Low Risk (02/18/2024)   Overall Financial Resource Strain (CARDIA)    Difficulty of Paying Living Expenses: Not hard at all  Food Insecurity: No Food Insecurity (02/18/2024)   Epic    Worried About Programme Researcher, Broadcasting/film/video in the Last Year: Never true    Ran Out of Food in the Last Year: Never true  Transportation Needs: No Transportation Needs (02/18/2024)   Epic    Lack of Transportation (Medical): No    Lack of Transportation (Non-Medical): No  Physical Activity: Insufficiently Active (02/18/2024)   Exercise Vital Sign    Days of Exercise per Week: 1 day    Minutes of Exercise per Session: 40 min  Stress: Stress Concern Present (02/18/2024)   [employer] of Occupational Health - Occupational Stress Questionnaire    Feeling of Stress: To some extent  Social Connections: Socially Integrated (02/18/2024)   Social Connection and Isolation Panel    Frequency of Communication with Friends and Family: More than three times a week    Frequency of Social Gatherings with Friends and Family: More than three times a week    Attends  Religious Services: More than 4 times per year    Active Member of Clubs or Organizations: Yes    Attends Banker Meetings: More than 4 times per year    Marital Status: Married  Catering Manager Violence: Not on file  Depression (PHQ2-9): Low Risk (02/22/2024)   Depression (PHQ2-9)    PHQ-2 Score: 0  Alcohol Screen: Low Risk (02/18/2024)   Alcohol Screen    Last Alcohol Screening Score (AUDIT): 1  Housing: Low Risk (02/18/2024)   Epic    Unable to Pay for Housing in the Last Year: No    Number of Times Moved in the Last Year: 1    Homeless in the Last Year: No  Utilities: Not on file  Health Literacy: Not on file     OBSERVATIONS/OBJECTIVE:  LMP 04/27/2016   GENERAL: Patient is a well appearing female in no acute distress  NODES:  No cervical, supraclavicular, or axillary lymphadenopathy palpated.  BREAST EXAM: Status post right breast mastectomy.  No palpable concerns. No regional adenopathy LUNGS:  Clear to auscultation bilaterally.  No wheezes or rhonchi. HEART:  Regular rate and rhythm. No murmur appreciated. ABDOMEN:  Soft, nontender.  Positive, normoactive bowel sounds. No organomegaly palpated. EXTREMITIES:  No peripheral edema.   SKIN:  Clear with no obvious rashes or skin changes. No nail dyscrasia. NEURO:  Nonfocal. Well oriented.  Appropriate affect. No change.  LABORATORY DATA:  None for this visit.  DIAGNOSTIC IMAGING:  None for this visit.      ASSESSMENT AND PLAN:   ASSESSMENT: 59 y.o. [place] woman with T1c N1, stage IIB invasive ductal carcinoma, grade 3, triple negative, with an MIB-1 of 30%              (1) genetics test 12/03/2020 through the [facility] CancerNext-Expanded +RNAinsight Panel found no deleterious mutations in AIP, ALK, APC, ATM, AXIN2, BAP1, BARD1, BLM, BMPR1A, BRCA1, BRCA2, BRIP1, CDC73, CDH1, CDK4, CDKN1B, CDKN2A,  CHEK2, CTNNA1, DICER1, FANCC, FH, FLCN, GALNT12, KIF1B, LZTR1, MAX, MEN1, MET, MLH1, MSH2, MSH3, MSH6, MUTYH,  NBN, NF1, NF2, NTHL1, PALB2, PHOX2B, PMS2, POT1, PRKAR1A, PTCH1, PTEN, RAD51C, RAD51D, RB1, RECQL, RET, SDHA, SDHAF2, SDHB, SDHC, SDHD, SMAD4, SMARCA4, SMARCB1, SMARCE1, STK11, SUFU, TMEM127, TP53, TSC1, TSC2, VHL and XRCC2 (sequencing and deletion/duplication); EGFR, EGLN1, HOXB13, KIT, MITF, PDGFRA, POLD1, and POLE (sequencing only); EPCAM and GREM1 (deletion/duplication only).     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   She underwent right mastectomy and targeted lymph node dissection, final pathology showing tumor measuring 2.3 cm in largest dimension, grade 3, negative margins with excellent response in the breast however minimal response in the lymph node, 2 out of 4 lymph nodes positive for involvement, extranodal extension present, largest metastasis is 4 mm.  She completed adj immunotherapy.  PLAN Assessment & Plan  ASSESSMENT: 59 y.o. [place] woman with T1c N1, stage IIB invasive ductal carcinoma, grade 3, triple negative, with an MIB-1 of 30%              (1) genetics test 12/03/2020 through the [facility]Next-Expanded +RNAinsight Panel found no deleterious mutations in AIP, ALK, APC, ATM, AXIN2, BAP1, BARD1, BLM, BMPR1A, BRCA1, BRCA2, BRIP1, CDC73, CDH1, CDK4, CDKN1B, CDKN2A, CHEK2, CTNNA1, DICER1, FANCC, FH, FLCN, GALNT12, KIF1B, LZTR1, MAX, MEN1, MET, MLH1, MSH2, MSH3, MSH6, MUTYH, NBN, NF1, NF2, NTHL1, PALB2, PHOX2B, PMS2, POT1, PRKAR1A, PTCH1, PTEN, RAD51C, RAD51D, RB1, RECQL, RET, SDHA, SDHAF2, SDHB, SDHC, SDHD, SMAD4, SMARCA4, SMARCB1, SMARCE1,  STK11, SUFU, TMEM127, TP53, TSC1, TSC2, VHL and XRCC2 (sequencing and deletion/duplication); EGFR, EGLN1, HOXB13, KIT, MITF, PDGFRA, POLD1, and POLE (sequencing only); EPCAM and GREM1 (deletion/duplication only).     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   She underwent right mastectomy and targeted lymph node dissection, final pathology showing tumor measuring 2.3 cm in largest dimension, grade 3, negative margins with excellent response in the breast however minimal response in the lymph node, 2 out of 4 lymph nodes positive for involvement, extranodal extension present, largest metastasis is 4 mm.   She completed adj immunotherapy.  History of breast cancer No concern for recurrence on ROS/PE Last MRD in October with no detectable ctDNA No concern on exam Unilateral mammogram of the left breast due in Aug 2025 She wants to continue guardant reveal every 3 months Labs pending from today.  Time spent: 20 min  *Total Encounter Time as defined by the Centers for Medicare and Medicaid Services includes, in addition to the face-to-face time of a patient visit (documented in the note above) non-face-to-face time: obtaining and reviewing outside history, ordering and reviewing medications, tests or procedures, care coordination (communications with other health care professionals or caregivers) and documentation in the medical record.  "

## 2024-08-17 ENCOUNTER — Ambulatory Visit: Admitting: Family Medicine

## 2024-10-31 ENCOUNTER — Inpatient Hospital Stay

## 2024-10-31 ENCOUNTER — Inpatient Hospital Stay: Admitting: Hematology and Oncology
